# Patient Record
Sex: Female | Born: 1935 | Race: Black or African American | Hispanic: No | Smoking: Never smoker
Health system: Southern US, Community
[De-identification: ages and names within clinical notes are randomized; demographics above are authoritative.]

## PROBLEM LIST (undated history)

## (undated) DIAGNOSIS — I739 Peripheral vascular disease, unspecified: Secondary | ICD-10-CM

## (undated) DIAGNOSIS — K651 Peritoneal abscess: Secondary | ICD-10-CM

## (undated) DIAGNOSIS — R9431 Abnormal electrocardiogram [ECG] [EKG]: Secondary | ICD-10-CM

## (undated) DIAGNOSIS — Z8042 Family history of malignant neoplasm of prostate: Secondary | ICD-10-CM

## (undated) DIAGNOSIS — K56609 Unspecified intestinal obstruction, unspecified as to partial versus complete obstruction: Secondary | ICD-10-CM

## (undated) DIAGNOSIS — I10 Essential (primary) hypertension: Secondary | ICD-10-CM

## (undated) DIAGNOSIS — E119 Type 2 diabetes mellitus without complications: Secondary | ICD-10-CM

## (undated) DIAGNOSIS — Z800 Family history of malignant neoplasm of digestive organs: Secondary | ICD-10-CM

## (undated) DIAGNOSIS — Z803 Family history of malignant neoplasm of breast: Secondary | ICD-10-CM

## (undated) DIAGNOSIS — J45909 Unspecified asthma, uncomplicated: Secondary | ICD-10-CM

## (undated) DIAGNOSIS — M4800 Spinal stenosis, site unspecified: Secondary | ICD-10-CM

## (undated) DIAGNOSIS — E559 Vitamin D deficiency, unspecified: Secondary | ICD-10-CM

## (undated) DIAGNOSIS — I6529 Occlusion and stenosis of unspecified carotid artery: Secondary | ICD-10-CM

## (undated) DIAGNOSIS — IMO0002 Reserved for concepts with insufficient information to code with codable children: Secondary | ICD-10-CM

## (undated) DIAGNOSIS — E785 Hyperlipidemia, unspecified: Secondary | ICD-10-CM

## (undated) HISTORY — DX: Abnormal electrocardiogram (ECG) (EKG): R94.31

## (undated) HISTORY — DX: Family history of malignant neoplasm of digestive organs: Z80.0

## (undated) HISTORY — DX: Family history of malignant neoplasm of breast: Z80.3

## (undated) HISTORY — DX: Type 2 diabetes mellitus without complications: E11.9

## (undated) HISTORY — DX: Spinal stenosis, site unspecified: M48.00

## (undated) HISTORY — PX: TUBAL LIGATION: SHX77

## (undated) HISTORY — DX: Vitamin D deficiency, unspecified: E55.9

## (undated) HISTORY — PX: CYST EXCISION: SHX5701

## (undated) HISTORY — DX: Peripheral vascular disease, unspecified: I73.9

## (undated) HISTORY — DX: Unspecified asthma, uncomplicated: J45.909

## (undated) HISTORY — DX: Essential (primary) hypertension: I10

## (undated) HISTORY — PX: CHOLECYSTECTOMY: SHX55

## (undated) HISTORY — DX: Occlusion and stenosis of unspecified carotid artery: I65.29

## (undated) HISTORY — DX: Hyperlipidemia, unspecified: E78.5

## (undated) HISTORY — DX: Family history of malignant neoplasm of prostate: Z80.42

## (undated) HISTORY — PX: HERNIA REPAIR: SHX51

## (undated) HISTORY — DX: Reserved for concepts with insufficient information to code with codable children: IMO0002

---

## 1962-03-21 HISTORY — PX: MINI-LAPAROTOMY W/ TUBAL LIGATION: SUR811

## 1997-09-15 ENCOUNTER — Ambulatory Visit (HOSPITAL_COMMUNITY): Admission: RE | Admit: 1997-09-15 | Discharge: 1997-09-15 | Payer: Self-pay | Admitting: Internal Medicine

## 1997-10-08 ENCOUNTER — Ambulatory Visit (HOSPITAL_COMMUNITY): Admission: RE | Admit: 1997-10-08 | Discharge: 1997-10-08 | Payer: Self-pay | Admitting: Internal Medicine

## 1998-08-28 ENCOUNTER — Encounter: Payer: Self-pay | Admitting: Internal Medicine

## 1998-08-28 ENCOUNTER — Ambulatory Visit (HOSPITAL_COMMUNITY): Admission: RE | Admit: 1998-08-28 | Discharge: 1998-08-28 | Payer: Self-pay | Admitting: Internal Medicine

## 1998-09-08 ENCOUNTER — Ambulatory Visit (HOSPITAL_COMMUNITY): Admission: RE | Admit: 1998-09-08 | Discharge: 1998-09-08 | Payer: Self-pay | Admitting: Internal Medicine

## 1999-09-17 ENCOUNTER — Encounter: Payer: Self-pay | Admitting: Internal Medicine

## 1999-09-17 ENCOUNTER — Ambulatory Visit (HOSPITAL_COMMUNITY): Admission: RE | Admit: 1999-09-17 | Discharge: 1999-09-17 | Payer: Self-pay | Admitting: Internal Medicine

## 2000-10-17 ENCOUNTER — Encounter: Payer: Self-pay | Admitting: Internal Medicine

## 2000-10-17 ENCOUNTER — Ambulatory Visit (HOSPITAL_COMMUNITY): Admission: RE | Admit: 2000-10-17 | Discharge: 2000-10-17 | Payer: Self-pay | Admitting: Internal Medicine

## 2001-10-10 ENCOUNTER — Other Ambulatory Visit: Admission: RE | Admit: 2001-10-10 | Discharge: 2001-10-10 | Payer: Self-pay | Admitting: Internal Medicine

## 2001-10-12 ENCOUNTER — Encounter: Payer: Self-pay | Admitting: Internal Medicine

## 2001-10-12 ENCOUNTER — Encounter: Admission: RE | Admit: 2001-10-12 | Discharge: 2001-10-12 | Payer: Self-pay | Admitting: Internal Medicine

## 2001-11-05 ENCOUNTER — Encounter: Payer: Self-pay | Admitting: Internal Medicine

## 2001-11-05 ENCOUNTER — Ambulatory Visit (HOSPITAL_COMMUNITY): Admission: RE | Admit: 2001-11-05 | Discharge: 2001-11-05 | Payer: Self-pay | Admitting: Internal Medicine

## 2002-11-04 ENCOUNTER — Encounter: Admission: RE | Admit: 2002-11-04 | Discharge: 2003-02-02 | Payer: Self-pay | Admitting: Internal Medicine

## 2003-01-08 ENCOUNTER — Encounter: Payer: Self-pay | Admitting: Internal Medicine

## 2003-01-08 ENCOUNTER — Ambulatory Visit (HOSPITAL_COMMUNITY): Admission: RE | Admit: 2003-01-08 | Discharge: 2003-01-08 | Payer: Self-pay | Admitting: Internal Medicine

## 2003-02-04 ENCOUNTER — Encounter: Admission: RE | Admit: 2003-02-04 | Discharge: 2003-05-05 | Payer: Self-pay | Admitting: Internal Medicine

## 2004-01-27 ENCOUNTER — Ambulatory Visit (HOSPITAL_COMMUNITY): Admission: RE | Admit: 2004-01-27 | Discharge: 2004-01-27 | Payer: Self-pay | Admitting: Internal Medicine

## 2005-02-17 ENCOUNTER — Ambulatory Visit (HOSPITAL_COMMUNITY): Admission: RE | Admit: 2005-02-17 | Discharge: 2005-02-17 | Payer: Self-pay | Admitting: Internal Medicine

## 2005-07-20 ENCOUNTER — Encounter: Payer: Self-pay | Admitting: Internal Medicine

## 2006-01-31 ENCOUNTER — Other Ambulatory Visit: Admission: RE | Admit: 2006-01-31 | Discharge: 2006-01-31 | Payer: Self-pay | Admitting: Internal Medicine

## 2006-03-06 ENCOUNTER — Ambulatory Visit (HOSPITAL_COMMUNITY): Admission: RE | Admit: 2006-03-06 | Discharge: 2006-03-06 | Payer: Self-pay | Admitting: Internal Medicine

## 2007-03-27 ENCOUNTER — Ambulatory Visit (HOSPITAL_COMMUNITY): Admission: RE | Admit: 2007-03-27 | Discharge: 2007-03-27 | Payer: Self-pay | Admitting: Internal Medicine

## 2008-04-11 ENCOUNTER — Encounter: Payer: Self-pay | Admitting: Internal Medicine

## 2008-04-14 ENCOUNTER — Ambulatory Visit (HOSPITAL_COMMUNITY): Admission: RE | Admit: 2008-04-14 | Discharge: 2008-04-14 | Payer: Self-pay | Admitting: Internal Medicine

## 2008-05-09 ENCOUNTER — Ambulatory Visit: Payer: Self-pay | Admitting: Internal Medicine

## 2008-05-22 ENCOUNTER — Ambulatory Visit: Payer: Self-pay | Admitting: Internal Medicine

## 2008-12-29 ENCOUNTER — Ambulatory Visit: Payer: Self-pay | Admitting: Gastroenterology

## 2008-12-30 ENCOUNTER — Inpatient Hospital Stay (HOSPITAL_COMMUNITY): Admission: EM | Admit: 2008-12-30 | Discharge: 2009-01-02 | Payer: Self-pay | Admitting: Emergency Medicine

## 2008-12-31 ENCOUNTER — Encounter (INDEPENDENT_AMBULATORY_CARE_PROVIDER_SITE_OTHER): Payer: Self-pay | Admitting: General Surgery

## 2009-01-01 ENCOUNTER — Encounter: Payer: Self-pay | Admitting: Gastroenterology

## 2009-05-22 ENCOUNTER — Ambulatory Visit (HOSPITAL_COMMUNITY): Admission: RE | Admit: 2009-05-22 | Discharge: 2009-05-22 | Payer: Self-pay | Admitting: Internal Medicine

## 2009-12-25 ENCOUNTER — Encounter: Admission: RE | Admit: 2009-12-25 | Discharge: 2009-12-25 | Payer: Self-pay | Admitting: Orthopedic Surgery

## 2010-02-27 ENCOUNTER — Emergency Department (HOSPITAL_COMMUNITY)
Admission: EM | Admit: 2010-02-27 | Discharge: 2010-02-27 | Payer: Self-pay | Source: Home / Self Care | Admitting: Emergency Medicine

## 2010-05-26 ENCOUNTER — Other Ambulatory Visit (HOSPITAL_COMMUNITY): Payer: Self-pay | Admitting: Internal Medicine

## 2010-05-26 DIAGNOSIS — Z1231 Encounter for screening mammogram for malignant neoplasm of breast: Secondary | ICD-10-CM

## 2010-06-01 ENCOUNTER — Ambulatory Visit (HOSPITAL_COMMUNITY)
Admission: RE | Admit: 2010-06-01 | Discharge: 2010-06-01 | Disposition: A | Discharge status: Home / Self Care | Payer: Medicare PPO | Source: Ambulatory Visit | Attending: Internal Medicine | Admitting: Internal Medicine

## 2010-06-01 DIAGNOSIS — Z1231 Encounter for screening mammogram for malignant neoplasm of breast: Secondary | ICD-10-CM

## 2010-06-01 LAB — POCT I-STAT, CHEM 8
BUN: 16 mg/dL (ref 6–23)
Calcium, Ion: 1.12 mmol/L (ref 1.12–1.32)
Chloride: 104 mEq/L (ref 96–112)
Creatinine, Ser: 1 mg/dL (ref 0.4–1.2)
Glucose, Bld: 162 mg/dL — ABNORMAL HIGH (ref 70–99)
HCT: 37 % (ref 36.0–46.0)
Hemoglobin: 12.6 g/dL (ref 12.0–15.0)
Potassium: 3.8 mEq/L (ref 3.5–5.1)
Sodium: 141 mEq/L (ref 135–145)
TCO2: 29 mmol/L (ref 0–100)

## 2010-06-01 LAB — DIFFERENTIAL
Basophils Absolute: 0 10*3/uL (ref 0.0–0.1)
Basophils Relative: 0 % (ref 0–1)
Eosinophils Absolute: 0.2 10*3/uL (ref 0.0–0.7)
Eosinophils Relative: 4 % (ref 0–5)
Lymphocytes Relative: 40 % (ref 12–46)
Lymphs Abs: 2.2 10*3/uL (ref 0.7–4.0)
Monocytes Absolute: 0.5 10*3/uL (ref 0.1–1.0)
Monocytes Relative: 9 % (ref 3–12)
Neutro Abs: 2.5 10*3/uL (ref 1.7–7.7)
Neutrophils Relative %: 46 % (ref 43–77)

## 2010-06-01 LAB — POCT CARDIAC MARKERS
CKMB, poc: 1.1 ng/mL (ref 1.0–8.0)
Myoglobin, poc: 90.1 ng/mL (ref 12–200)
Troponin i, poc: <0.05 ng/mL (ref 0.00–0.09)

## 2010-06-01 LAB — CBC
HCT: 34.8 % — ABNORMAL LOW (ref 36.0–46.0)
Hemoglobin: 11.3 g/dL — ABNORMAL LOW (ref 12.0–15.0)
MCH: 30.8 pg (ref 26.0–34.0)
MCHC: 32.5 g/dL (ref 30.0–36.0)
MCV: 94.8 fL (ref 78.0–100.0)
Platelets: 222 10*3/uL (ref 150–400)
RBC: 3.67 MIL/uL — ABNORMAL LOW (ref 3.87–5.11)
RDW: 12.8 % (ref 11.5–15.5)
WBC: 5.3 10*3/uL (ref 4.0–10.5)

## 2010-06-01 LAB — BASIC METABOLIC PANEL
BUN: 12 mg/dL (ref 6–23)
CO2: 27 mEq/L (ref 19–32)
Calcium: 8.7 mg/dL (ref 8.4–10.5)
Chloride: 101 mEq/L (ref 96–112)
Creatinine, Ser: 0.88 mg/dL (ref 0.4–1.2)
GFR calc Af Amer: >60 mL/min (ref >60)
GFR calc non Af Amer: >60 mL/min (ref >60)
Glucose, Bld: 174 mg/dL — ABNORMAL HIGH (ref 70–99)
Potassium: 3.7 mEq/L (ref 3.5–5.1)
Sodium: 133 mEq/L — ABNORMAL LOW (ref 135–145)

## 2010-06-24 LAB — BASIC METABOLIC PANEL
BUN: 7 mg/dL (ref 6–23)
CO2: 30 mEq/L (ref 19–32)
Chloride: 102 mEq/L (ref 96–112)
Glucose, Bld: 130 mg/dL — ABNORMAL HIGH (ref 70–99)
Potassium: 3.6 mEq/L (ref 3.5–5.1)

## 2010-06-24 LAB — COMPREHENSIVE METABOLIC PANEL
ALT: 15 U/L (ref 0–35)
Alkaline Phosphatase: 62 U/L (ref 39–117)
BUN: 10 mg/dL (ref 6–23)
BUN: 4 mg/dL — ABNORMAL LOW (ref 6–23)
CO2: 28 mEq/L (ref 19–32)
Calcium: 9.5 mg/dL (ref 8.4–10.5)
Chloride: 101 mEq/L (ref 96–112)
GFR calc non Af Amer: >60 mL/min (ref >60)
Glucose, Bld: 158 mg/dL — ABNORMAL HIGH (ref 70–99)
Glucose, Bld: 176 mg/dL — ABNORMAL HIGH (ref 70–99)
Potassium: 3.4 mEq/L — ABNORMAL LOW (ref 3.5–5.1)
Sodium: 133 mEq/L — ABNORMAL LOW (ref 135–145)
Total Bilirubin: 0.5 mg/dL (ref 0.3–1.2)
Total Protein: 6 g/dL (ref 6.0–8.3)
Total Protein: 7.2 g/dL (ref 6.0–8.3)

## 2010-06-24 LAB — CBC
HCT: 29.7 % — ABNORMAL LOW (ref 36.0–46.0)
HCT: 30.7 % — ABNORMAL LOW (ref 36.0–46.0)
HCT: 33.1 % — ABNORMAL LOW (ref 36.0–46.0)
Hemoglobin: 10 g/dL — ABNORMAL LOW (ref 12.0–15.0)
Hemoglobin: 10.2 g/dL — ABNORMAL LOW (ref 12.0–15.0)
MCHC: 33.2 g/dL (ref 30.0–36.0)
MCHC: 33.6 g/dL (ref 30.0–36.0)
MCV: 97.3 fL (ref 78.0–100.0)
MCV: 97.5 fL (ref 78.0–100.0)
MCV: 98.5 fL (ref 78.0–100.0)
Platelets: 136 10*3/uL — ABNORMAL LOW (ref 150–400)
Platelets: 150 10*3/uL (ref 150–400)
Platelets: 168 10*3/uL (ref 150–400)
RBC: 3.55 MIL/uL — ABNORMAL LOW (ref 3.87–5.11)
RBC: 4.09 MIL/uL (ref 3.87–5.11)
RDW: 13.4 % (ref 11.5–15.5)
RDW: 13.8 % (ref 11.5–15.5)
WBC: 14.3 10*3/uL — ABNORMAL HIGH (ref 4.0–10.5)
WBC: 14.4 10*3/uL — ABNORMAL HIGH (ref 4.0–10.5)
WBC: 7.8 10*3/uL (ref 4.0–10.5)

## 2010-06-24 LAB — CARDIAC PANEL(CRET KIN+CKTOT+MB+TROPI)
CK, MB: 2.4 ng/mL (ref 0.3–4.0)
CK, MB: 3.8 ng/mL (ref 0.3–4.0)
Relative Index: 1 (ref 0.0–2.5)
Total CK: 182 U/L — ABNORMAL HIGH (ref 7–177)
Total CK: 381 U/L — ABNORMAL HIGH (ref 7–177)
Troponin I: 0.05 ng/mL (ref 0.00–0.06)
Troponin I: 0.06 ng/mL (ref 0.00–0.06)

## 2010-06-24 LAB — URINALYSIS, ROUTINE W REFLEX MICROSCOPIC
Bilirubin Urine: NEGATIVE
Ketones, ur: NEGATIVE mg/dL
Specific Gravity, Urine: 1.035 — ABNORMAL HIGH (ref 1.005–1.030)
Urobilinogen, UA: 0.2 mg/dL (ref 0.0–1.0)
pH: 6 (ref 5.0–8.0)

## 2010-06-24 LAB — LIPID PANEL
Cholesterol: 121 mg/dL (ref 0–200)
HDL: 53 mg/dL (ref >39)
LDL Cholesterol: 65 mg/dL (ref 0–99)
Triglycerides: 56 mg/dL (ref <150)
VLDL: 11 mg/dL (ref 0–40)

## 2010-06-24 LAB — POCT I-STAT, CHEM 8
BUN: 13 mg/dL (ref 6–23)
Chloride: 99 mEq/L (ref 96–112)
Creatinine, Ser: 0.6 mg/dL (ref 0.4–1.2)
Glucose, Bld: 149 mg/dL — ABNORMAL HIGH (ref 70–99)
Potassium: 3.2 mEq/L — ABNORMAL LOW (ref 3.5–5.1)
Sodium: 136 mEq/L (ref 135–145)

## 2010-06-24 LAB — URINE MICROSCOPIC-ADD ON

## 2010-06-24 LAB — DIFFERENTIAL
Eosinophils Absolute: 0 10*3/uL (ref 0.0–0.7)
Lymphocytes Relative: 15 % (ref 12–46)
Lymphs Abs: 1.2 10*3/uL (ref 0.7–4.0)
Monocytes Relative: 3 % (ref 3–12)
Neutrophils Relative %: 82 % — ABNORMAL HIGH (ref 43–77)

## 2010-06-24 LAB — POCT CARDIAC MARKERS
CKMB, poc: 2.1 ng/mL (ref 1.0–8.0)
Myoglobin, poc: 86.5 ng/mL (ref 12–200)
Troponin i, poc: <0.05 ng/mL (ref 0.00–0.09)

## 2010-06-24 LAB — CK TOTAL AND CKMB (NOT AT ARMC)
CK, MB: 3.3 ng/mL (ref 0.3–4.0)
Total CK: 132 U/L (ref 7–177)

## 2010-06-24 LAB — TROPONIN I: Troponin I: 0.04 ng/mL (ref 0.00–0.06)

## 2010-06-24 LAB — LIPASE, BLOOD: Lipase: 17 U/L (ref 11–59)

## 2010-06-24 LAB — AMYLASE
Amylase: 100 U/L (ref 27–131)
Amylase: 42 U/L (ref 27–131)

## 2010-06-24 LAB — PROTIME-INR: Prothrombin Time: 12.8 seconds (ref 11.6–15.2)

## 2010-06-24 LAB — APTT: aPTT: 26 seconds (ref 24–37)

## 2011-04-12 ENCOUNTER — Other Ambulatory Visit (HOSPITAL_COMMUNITY): Payer: Self-pay | Admitting: Internal Medicine

## 2011-04-12 DIAGNOSIS — Z780 Asymptomatic menopausal state: Secondary | ICD-10-CM

## 2011-04-14 ENCOUNTER — Ambulatory Visit (HOSPITAL_COMMUNITY)
Admission: RE | Admit: 2011-04-14 | Discharge: 2011-04-14 | Disposition: A | Discharge status: Home / Self Care | Payer: Medicare PPO | Source: Ambulatory Visit | Attending: Internal Medicine | Admitting: Internal Medicine

## 2011-04-14 DIAGNOSIS — Z780 Asymptomatic menopausal state: Secondary | ICD-10-CM | POA: Insufficient documentation

## 2011-04-14 DIAGNOSIS — Z13820 Encounter for screening for osteoporosis: Secondary | ICD-10-CM | POA: Insufficient documentation

## 2011-05-26 ENCOUNTER — Other Ambulatory Visit: Payer: Self-pay | Admitting: Internal Medicine

## 2011-05-26 DIAGNOSIS — I714 Abdominal aortic aneurysm, without rupture, unspecified: Secondary | ICD-10-CM

## 2011-06-02 ENCOUNTER — Ambulatory Visit
Admission: RE | Admit: 2011-06-02 | Discharge: 2011-06-02 | Disposition: A | Discharge status: Home / Self Care | Payer: Medicare PPO | Source: Ambulatory Visit | Attending: Internal Medicine | Admitting: Internal Medicine

## 2011-06-02 DIAGNOSIS — I714 Abdominal aortic aneurysm, without rupture, unspecified: Secondary | ICD-10-CM

## 2011-06-07 ENCOUNTER — Other Ambulatory Visit (HOSPITAL_COMMUNITY): Payer: Self-pay | Admitting: Internal Medicine

## 2011-06-07 DIAGNOSIS — Z1231 Encounter for screening mammogram for malignant neoplasm of breast: Secondary | ICD-10-CM

## 2011-07-01 ENCOUNTER — Ambulatory Visit (HOSPITAL_COMMUNITY)
Admission: RE | Admit: 2011-07-01 | Discharge: 2011-07-01 | Disposition: A | Discharge status: Home / Self Care | Payer: Medicare PPO | Source: Ambulatory Visit | Attending: Internal Medicine | Admitting: Internal Medicine

## 2011-07-01 DIAGNOSIS — Z1231 Encounter for screening mammogram for malignant neoplasm of breast: Secondary | ICD-10-CM | POA: Insufficient documentation

## 2012-06-13 ENCOUNTER — Other Ambulatory Visit (HOSPITAL_COMMUNITY): Payer: Self-pay | Admitting: Internal Medicine

## 2012-06-13 DIAGNOSIS — Z1231 Encounter for screening mammogram for malignant neoplasm of breast: Secondary | ICD-10-CM

## 2012-07-02 ENCOUNTER — Ambulatory Visit (HOSPITAL_COMMUNITY)
Admission: RE | Admit: 2012-07-02 | Discharge: 2012-07-02 | Disposition: A | Discharge status: Home / Self Care | Payer: Medicare PPO | Source: Ambulatory Visit | Attending: Internal Medicine | Admitting: Internal Medicine

## 2012-07-02 DIAGNOSIS — Z1231 Encounter for screening mammogram for malignant neoplasm of breast: Secondary | ICD-10-CM | POA: Insufficient documentation

## 2013-01-30 ENCOUNTER — Telehealth: Payer: Self-pay | Admitting: *Deleted

## 2013-01-30 ENCOUNTER — Other Ambulatory Visit: Payer: Self-pay | Admitting: Emergency Medicine

## 2013-01-30 MED ORDER — ENALAPRIL MALEATE 20 MG PO TABS: 20.0000 mg | ORAL_TABLET | Freq: Two times a day (BID) | ORAL | Status: DC

## 2013-01-30 NOTE — Telephone Encounter (Signed)
DOB 09/02/1929  Pt needs rx called to "new" pharm   =  Kristine Burnett   [id number]  Ph  [phone] fax [phone]   enalipril

## 2013-03-19 ENCOUNTER — Encounter: Payer: Self-pay | Admitting: Internal Medicine

## 2013-03-22 ENCOUNTER — Ambulatory Visit (INDEPENDENT_AMBULATORY_CARE_PROVIDER_SITE_OTHER): Payer: Medicare PPO | Admitting: Internal Medicine

## 2013-03-22 ENCOUNTER — Encounter: Payer: Self-pay | Admitting: Internal Medicine

## 2013-03-22 VITALS — BP 188/94 | HR 64 | Temp 97.9°F | Resp 18 | Wt 184.4 lb

## 2013-03-22 DIAGNOSIS — I10 Essential (primary) hypertension: Secondary | ICD-10-CM | POA: Insufficient documentation

## 2013-03-22 DIAGNOSIS — E119 Type 2 diabetes mellitus without complications: Secondary | ICD-10-CM

## 2013-03-22 DIAGNOSIS — Z79899 Other long term (current) drug therapy: Secondary | ICD-10-CM

## 2013-03-22 DIAGNOSIS — E559 Vitamin D deficiency, unspecified: Secondary | ICD-10-CM | POA: Insufficient documentation

## 2013-03-22 DIAGNOSIS — J45909 Unspecified asthma, uncomplicated: Secondary | ICD-10-CM | POA: Insufficient documentation

## 2013-03-22 DIAGNOSIS — R7309 Other abnormal glucose: Secondary | ICD-10-CM

## 2013-03-22 DIAGNOSIS — Z0000 Encounter for general adult medical examination without abnormal findings: Secondary | ICD-10-CM

## 2013-03-22 DIAGNOSIS — E782 Mixed hyperlipidemia: Secondary | ICD-10-CM | POA: Insufficient documentation

## 2013-03-22 DIAGNOSIS — Z1212 Encounter for screening for malignant neoplasm of rectum: Secondary | ICD-10-CM

## 2013-03-22 HISTORY — DX: Type 2 diabetes mellitus without complications: E11.9

## 2013-03-22 LAB — HEMOGLOBIN A1C
Hgb A1c MFr Bld: 6.9 % — ABNORMAL HIGH (ref <5.7)
Mean Plasma Glucose: 151 mg/dL — ABNORMAL HIGH (ref <117)

## 2013-03-22 LAB — LIPID PANEL
CHOL/HDL RATIO: 2.9 ratio
Cholesterol: 161 mg/dL (ref 0–200)
HDL: 56 mg/dL (ref >39)
LDL Cholesterol: 93 mg/dL (ref 0–99)
TRIGLYCERIDES: 58 mg/dL (ref <150)
VLDL: 12 mg/dL (ref 0–40)

## 2013-03-22 LAB — CBC WITH DIFFERENTIAL/PLATELET
BASOS ABS: 0 10*3/uL (ref 0.0–0.1)
Basophils Relative: 1 % (ref 0–1)
EOS PCT: 4 % (ref 0–5)
Eosinophils Absolute: 0.2 10*3/uL (ref 0.0–0.7)
HCT: 41.3 % (ref 36.0–46.0)
Hemoglobin: 13.8 g/dL (ref 12.0–15.0)
LYMPHS ABS: 1.9 10*3/uL (ref 0.7–4.0)
LYMPHS PCT: 36 % (ref 12–46)
MCH: 30.2 pg (ref 26.0–34.0)
MCHC: 33.4 g/dL (ref 30.0–36.0)
MCV: 90.4 fL (ref 78.0–100.0)
Monocytes Absolute: 0.4 10*3/uL (ref 0.1–1.0)
Monocytes Relative: 7 % (ref 3–12)
NEUTROS ABS: 2.7 10*3/uL (ref 1.7–7.7)
NEUTROS PCT: 52 % (ref 43–77)
PLATELETS: 206 10*3/uL (ref 150–400)
RBC: 4.57 MIL/uL (ref 3.87–5.11)
RDW: 13.9 % (ref 11.5–15.5)
WBC: 5.1 10*3/uL (ref 4.0–10.5)

## 2013-03-22 LAB — BASIC METABOLIC PANEL WITH GFR
BUN: 14 mg/dL (ref 6–23)
CHLORIDE: 99 meq/L (ref 96–112)
CO2: 30 mEq/L (ref 19–32)
CREATININE: 0.8 mg/dL (ref 0.50–1.10)
Calcium: 10 mg/dL (ref 8.4–10.5)
GFR, EST NON AFRICAN AMERICAN: 69 mL/min
GFR, Est African American: 80 mL/min
Glucose, Bld: 109 mg/dL — ABNORMAL HIGH (ref 70–99)
Potassium: 3.9 mEq/L (ref 3.5–5.3)
SODIUM: 140 meq/L (ref 135–145)

## 2013-03-22 LAB — TSH: TSH: 2.332 u[IU]/mL (ref 0.350–4.500)

## 2013-03-22 LAB — HEPATIC FUNCTION PANEL
ALBUMIN: 4.1 g/dL (ref 3.5–5.2)
ALK PHOS: 61 U/L (ref 39–117)
ALT: 16 U/L (ref 0–35)
AST: 25 U/L (ref 0–37)
BILIRUBIN DIRECT: 0.1 mg/dL (ref 0.0–0.3)
BILIRUBIN TOTAL: 0.4 mg/dL (ref 0.3–1.2)
Indirect Bilirubin: 0.3 mg/dL (ref 0.0–0.9)
Total Protein: 7.8 g/dL (ref 6.0–8.3)

## 2013-03-22 LAB — MAGNESIUM: MAGNESIUM: 1.9 mg/dL (ref 1.5–2.5)

## 2013-03-22 NOTE — Progress Notes (Signed)
Patient ID: Kristine Burnett, female   DOB: 04-01-1929, 80 y.o.   MRN: [id number]   This very nice 80 y.o. WBF presents for 3 month follow up with Hypertension, Hyperlipidemia, Pre-Diabetes and Vitamin D Deficiency.    HTN predates from 49. BP has been controlled at home. Today's BP: 188/94 mmHg - patient relates she is very upset as she lost her car keys coming into the office. Last labs showed BUN/Creat 11/0.96 and calc GFR of 56 suggesting moderate renal impairment. Patient denies any cardiac type chest pain, palpitations, dyspnea/orthopnea/PND, dizziness, claudication, or dependent edema.   Hyperlipidemia is controlled with diet & meds. Last Cholesterol was 150, Triglycerides were 55, HDL 55 and LDL 54 in Sept - all at goal. Patient denies myalgias or other med SE's.    Also, the patient has history of T2 NIDDM diagnosed in 2004 with A1c 6.7% and with weight loss has improved but still in diet controlled T2DM range with last A1c of 6.8% in Sept. Patient denies any symptoms of reactive hypoglycemia, diabetic polys, paresthesias or visual blurring.   She has lumbar DDD and Spinal Stenosis and has been seen by Dr's Eddie Dibbles and [facility] [place] since 2011. She has also been on- off and back on fosamax since June 2012. Since 2011, she has used a cane til 2013 she began using a walker for gait stability.    Further, Patient has history of Vitamin D Deficiency at 28 in 2008 with last vitamin D of 82 in June. Patient supplements vitamin D without any suspected side-effects.  Medication Sig Dispense Refill  . alendronate (FOSAMAX) 70 MG tablet Take 70 mg by mouth once a week. Take with a full glass of water on an empty stomach.      Marland Kitchen aspirin 325 MG tablet Take 325 mg by mouth daily.      Marland Kitchen atenolol (TENORMIN) 100 MG tablet Take 100 mg by mouth daily.      . Cholecalciferol (VITAMIN D-3) 1000 UNITS CAPS Take 4,000 Units by mouth daily.      Marland Kitchen diltiazem (CARDIZEM) 120 MG tablet Take 120 mg by mouth 2 (two)  times daily.      . enalapril (VASOTEC) 20 MG tablet Take 1 tablet (20 mg total) by mouth 2 (two) times daily.  60 tablet  6  . hydrochlorothiazide (HYDRODIURIL) 25 MG tablet Take 12.5 mg by mouth daily.      . simvastatin (ZOCOR) 40 MG tablet Take 40 mg by mouth daily.         Allergies  Allergen Reactions  . Ace Inhibitors Cough  . Crestor [Rosuvastatin]   . Grapefruit Extract   . Minoxidil   . Penicillins     PMHx:   Past Medical History  Diagnosis Date  . Hypertension   . Hyperlipidemia   . Diabetes mellitus without complication   . Vitamin D deficiency   . Asthma   . DDD (degenerative disc disease) - Lumbar   . Spinal stenosis - Lumbar     FHx:    Reviewed / unchanged  SHx:    Reviewed / unchanged  Systems Review: Constitutional: Denies fever, chills, wt changes, headaches, insomnia, fatigue, night sweats, change in appetite. Eyes: Denies redness, blurred vision, diplopia, discharge, itchy, watery eyes.  ENT: Denies discharge, congestion, post nasal drip, epistaxis, sore throat, earache, hearing loss, dental pain, tinnitus, vertigo, sinus pain, snoring.  CV: Denies chest pain, palpitations, irregular heartbeat, syncope, dyspnea, diaphoresis, orthopnea, PND, claudication, edema. Respiratory: denies cough,  dyspnea, DOE, pleurisy, hoarseness, laryngitis, wheezing.  Gastrointestinal: Denies dysphagia, odynophagia, heartburn, reflux, water brash, abdominal pain or cramps, nausea, vomiting, bloating, diarrhea, constipation, hematemesis, melena, hematochezia,  or hemorrhoids. Genitourinary: Denies dysuria, frequency, urgency, nocturia, hesitancy, discharge, hematuria, flank pain. Musculoskeletal: Denies arthralgias, myalgias, stiffness, jt. swelling,  limp, strain/sprain. Does c/o occasional Low Back Pain w/o Sciatica. Skin: Denies pruritus, rash, hives, warts, acne, eczema, change in skin lesion(s). Neuro: No weakness, tremor, incoordination, spasms, paresthesia, or  pain. Psychiatric: Denies confusion, memory loss, or sensory loss. Endo: Denies change in weight, skin, hair change.  Heme/Lymph: No excessive bleeding, bruising, orenlarged lymph nodes.  BP: 188/94      Rechecked at 181/91  Pulse: 64  Temp: 97.9 F (36.6 C)  Resp: 18    On Exam: Appears well nourished - in no distress. Eyes: PERRLA, EOMs, conjunctiva no swelling or erythema. Sinuses: No frontal/maxillary tenderness ENT/Mouth: EAC's clear, TM's nl w/o erythema, bulging. Nares clear w/o erythema, swelling, exudates. Oropharynx clear without erythema or exudates. Oral hygiene is good. Tongue normal, non obstructing. Hearing intact.  Neck: Supple. Thyroid nl. Car 2+/2+ without bruits, nodes or JVD. Chest: Respirations nl with BS clear & equal w/o rales, rhonchi, wheezing or stridor.  Cor: Heart sounds normal w/ regular rate and rhythm without sig. murmurs, gallops, clicks, or rubs. Peripheral pulses normal and equal  without edema.  Abdomen: Soft & bowel sounds normal. Non-tender w/o guarding, rebound, hernias, masses, or organomegaly.  Lymphatics: Unremarkable.  Musculoskeletal: Full ROM all peripheral extremities, joint stability, 5/5 strength, and uses a walker for gait stability.  Skin: Warm, dry without exposed rashes, lesions, ecchymosis apparent.  Neuro: Cranial nerves intact, reflexes equal bilaterally. Sensory-motor testing grossly intact. Tendon reflexes grossly intact.  Pysch: Alert & oriented x 3. Insight and judgement nl & appropriate. No ideations.  Assessment and Plan:  1. Hypertension - Continue monitor blood pressure at home & advised to call if remains elevated over 135/85. Continue diet/meds same.  2. Hyperlipidemia - Continue diet/meds, exercise,& lifestyle modifications. Continue monitor periodic cholesterol/liver & renal functions   3. Diabetes - continue recommend prudent low glycemic diet, strongly urging weight control, regular exercise, diabetic monitoring and  periodic eye exams.  4. Vitamin D Deficiency - Continue supplementation.  Recommended regular exercise, BP monitoring, weight control, and discussed med and SE's. Recommended labs to assess and monitor clinical status. Further disposition pending results of labs.

## 2013-03-22 NOTE — Patient Instructions (Signed)

## 2013-03-23 LAB — INSULIN, FASTING: INSULIN FASTING, SERUM: 21 u[IU]/mL (ref 3–28)

## 2013-03-23 LAB — VITAMIN D 25 HYDROXY (VIT D DEFICIENCY, FRACTURES): VIT D 25 HYDROXY: 97 ng/mL — AB (ref 30–89)

## 2013-03-25 ENCOUNTER — Other Ambulatory Visit: Payer: Commercial Managed Care - HMO | Admitting: *Deleted

## 2013-03-25 ENCOUNTER — Telehealth: Payer: Self-pay | Admitting: *Deleted

## 2013-03-25 DIAGNOSIS — K625 Hemorrhage of anus and rectum: Secondary | ICD-10-CM

## 2013-03-25 NOTE — Telephone Encounter (Signed)
Message copied by Emelda Brothers on Mon Mar 25, 2013 10:26 AM ------      Message from: Unk Pinto      Created: Sat Mar 23, 2013  1:41 PM       CBC kidneys thyroid mag - all nl & ok --- chol 161 excellent - keep up the great work --- A1c still elevated almost ready to start diabetic meds -unless lose weight - about 20 #       Vit D 97 - excellent - keep up the great work ------

## 2013-04-04 ENCOUNTER — Other Ambulatory Visit: Payer: Self-pay | Admitting: *Deleted

## 2013-04-04 DIAGNOSIS — K625 Hemorrhage of anus and rectum: Secondary | ICD-10-CM

## 2013-04-04 LAB — POC HEMOCCULT BLD/STL (HOME/3-CARD/SCREEN)
Card #2 Fecal Occult Blod, POC: NEGATIVE
Card #3 Fecal Occult Blood, POC: NEGATIVE
FECAL OCCULT BLD: NEGATIVE

## 2013-05-21 ENCOUNTER — Ambulatory Visit (INDEPENDENT_AMBULATORY_CARE_PROVIDER_SITE_OTHER): Payer: Medicare PPO | Admitting: Emergency Medicine

## 2013-05-21 ENCOUNTER — Encounter: Payer: Self-pay | Admitting: Emergency Medicine

## 2013-05-21 VITALS — BP 158/80 | HR 68 | Temp 98.2°F | Resp 18 | Ht 66.5 in | Wt 183.0 lb

## 2013-05-21 DIAGNOSIS — R319 Hematuria, unspecified: Secondary | ICD-10-CM

## 2013-05-21 LAB — URINALYSIS, ROUTINE W REFLEX MICROSCOPIC
GLUCOSE, UA: 100 mg/dL — AB
KETONES UR: 40 mg/dL — AB
Nitrite: POSITIVE — AB
Protein, ur: 100 mg/dL — AB
Specific Gravity, Urine: 1.014 (ref 1.005–1.030)
Urobilinogen, UA: 1 mg/dL (ref 0.0–1.0)
pH: 5.5 (ref 5.0–8.0)

## 2013-05-21 LAB — URINALYSIS, MICROSCOPIC ONLY
Bacteria, UA: NONE SEEN
Casts: NONE SEEN
RBC / HPF: >50 RBC/hpf — AB (ref <3)

## 2013-05-21 MED ORDER — PHENAZOPYRIDINE HCL 200 MG PO TABS: 200.0000 mg | ORAL_TABLET | Freq: Three times a day (TID) | ORAL | Status: DC | PRN

## 2013-05-21 MED ORDER — CIPROFLOXACIN HCL 500 MG PO TABS: 500.0000 mg | ORAL_TABLET | Freq: Two times a day (BID) | ORAL | Status: AC

## 2013-05-21 NOTE — Progress Notes (Signed)
Subjective:    Patient ID: Kristine Burnett, female    DOB: Sep 17, 1929, 80 y.o.   MRN: [id number]  HPI Comments: 80 yo female with new onset of blood in urine. She has increased frequency, urgency, and occasionally incontinence. She deines back pain/ fever. She smoked in her early 41s socially.  She drinks 6 cups of coffee daily. She had one episode of blood over 1 year ago was treated with ABX and cleared.   She notes BP was better before infection. She denies any CV complaints.  Dysuria  Associated symptoms include hematuria.  Hematuria Associated symptoms include dysuria.    Current Outpatient Prescriptions on File Prior to Visit  Medication Sig Dispense Refill  . alendronate (FOSAMAX) 70 MG tablet Take 70 mg by mouth once a week. Take with a full glass of water on an empty stomach.      Marland Kitchen aspirin 325 MG tablet Take 325 mg by mouth daily.      Marland Kitchen atenolol (TENORMIN) 100 MG tablet Take 100 mg by mouth daily.      . baclofen (LIORESAL) 10 MG tablet       . Cholecalciferol (VITAMIN D-3) 1000 UNITS CAPS Take 4,000 Units by mouth daily.      Marland Kitchen diltiazem (CARDIZEM) 120 MG tablet Take 120 mg by mouth 2 (two) times daily.      . enalapril (VASOTEC) 20 MG tablet Take 1 tablet (20 mg total) by mouth 2 (two) times daily.  60 tablet  6  . Flaxseed, Linseed, (FLAXSEED OIL) 1000 MG CAPS Take 1,000 mg by mouth 4 (four) times daily.      . hydrochlorothiazide (HYDRODIURIL) 25 MG tablet Take 12.5 mg by mouth daily.      . Magnesium Oxide (MAG-OX PO) Take 500 mg by mouth 2 (two) times daily.      Marland Kitchen oxybutynin (DITROPAN) 5 MG tablet       . simvastatin (ZOCOR) 40 MG tablet Take 40 mg by mouth daily.       No current facility-administered medications on file prior to visit.   Allergies  Allergen Reactions  . Ace Inhibitors Cough  . Crestor [Rosuvastatin]   . Grapefruit Extract   . Minoxidil   . Penicillins    Past Medical History  Diagnosis Date  . Hypertension   . Hyperlipidemia   . Diabetes  mellitus without complication   . Vitamin D deficiency   . Asthma   . DDD (degenerative disc disease)   . Spinal stenosis      Review of Systems  Genitourinary: Positive for dysuria and hematuria.  All other systems reviewed and are negative.   BP 158/80  Pulse 68  Temp(Src) 98.2 F (36.8 C) (Temporal)  Resp 18  Ht 5' 6.5" (1.689 m)  Wt 183 lb (83.008 kg)  BMI 29.10 kg/m2     Objective:   Physical Exam  Nursing note and vitals reviewed. Constitutional: She is oriented to person, place, and time. She appears well-developed and well-nourished.  HENT:  Head: Normocephalic and atraumatic.  Eyes: Conjunctivae are normal.  Neck: Normal range of motion.  Cardiovascular: Normal rate, regular rhythm, normal heart sounds and intact distal pulses.   Pulmonary/Chest: Effort normal and breath sounds normal.  Abdominal: Soft. Bowel sounds are normal. She exhibits no distension and no mass. There is no tenderness. There is no rebound and no guarding.  Musculoskeletal: Normal range of motion.  Neurological: She is alert and oriented to person, place, and time.  Skin: Skin is warm and dry.  Psychiatric: She has a normal mood and affect. Judgment normal.          Assessment & Plan:  1. Hematuria/ Dysuria-  UTI ?-check labs, push H2O Hygiene explained, decrease caffeine. Cipro 500 mg AD, Pyridium 200 mg AD/ PRN 2. HTN vs anxious about infection- Check BP call if >130/80, increase cardio, advised of concern with urine infection and sepsis and she will go to the ER if any symptom increase

## 2013-05-21 NOTE — Patient Instructions (Signed)
Urinary Tract Infection A urinary tract infection (UTI) can occur any place along the urinary tract. The tract includes the kidneys, ureters, bladder, and urethra. A type of germ called bacteria often causes a UTI. UTIs are often helped with antibiotic medicine.  HOME CARE   If given, take antibiotics as told by your doctor. Finish them even if you start to feel better.  Drink enough fluids to keep your pee (urine) clear or pale yellow.  Avoid tea, drinks with caffeine, and bubbly (carbonated) drinks.  Pee often. Avoid holding your pee in for a long time.  Pee before and after having sex (intercourse).  Wipe from front to back after you poop (bowel movement) if you are a woman. Use each tissue only once. GET HELP RIGHT AWAY IF:   You have back pain.  You have lower belly (abdominal) pain.  You have chills.  You feel sick to your stomach (nauseous).  You throw up (vomit).  Your burning or discomfort with peeing does not go away.  You have a fever.  Your symptoms are not better in 3 days. MAKE SURE YOU:   Understand these instructions.  Will watch your condition.  Will get help right away if you are not doing well or get worse. Document Released: 08/24/2007 Document Revised: 11/30/2011 Document Reviewed: 10/06/2011 [facility] Patient Information 2014 [place], [place]. Hematuria, Adult Hematuria is blood in your urine. It can be caused by a bladder infection, kidney infection, prostate infection, kidney stone, or cancer of your urinary tract. Infections can usually be treated with medicine, and a kidney stone usually will pass through your urine. If neither of these is the cause of your hematuria, further workup to find out the reason may be needed. It is very important that you tell your health care provider about any blood you see in your urine, even if the blood stops without treatment or happens without causing pain. Blood in your urine that happens and then stops and then  happens again can be a symptom of a very serious condition. Also, pain is not a symptom in the initial stages of many urinary cancers. HOME CARE INSTRUCTIONS   Drink lots of fluid, 3 4 quarts a day. If you have been diagnosed with an infection, cranberry juice is especially recommended, in addition to large amounts of water.  Avoid caffeine, tea, and carbonated beverages, because they tend to irritate the bladder.  Avoid alcohol because it may irritate the prostate.  Only take over-the-counter or prescription medicines for pain, discomfort, or fever as directed by your health care provider.  If you have been diagnosed with a kidney stone, follow your health care provider's instructions regarding straining your urine to catch the stone.  Empty your bladder often. Avoid holding urine for long periods of time.  After a bowel movement, women should cleanse front to back. Use each tissue only once.  Empty your bladder before and after sexual intercourse if you are a female. SEEK MEDICAL CARE IF: You develop back pain, fever, a feeling of sickness in your stomach (nausea), or vomiting or if your symptoms are not better in 3 days. Return sooner if you are getting worse. SEEK IMMEDIATE MEDICAL CARE IF:   You have a persistent fever, with a temperature of 101.38F (38.8C) or greater.  You develop severe vomiting and are unable to keep the medicine down.  You develop severe back or abdominal pain despite taking your medicines.  You begin passing a large amount of blood or clots  in your urine.  You feel extremely weak or faint, or you pass out. MAKE SURE YOU:   Understand these instructions.  Will watch your condition.  Will get help right away if you are not doing well or get worse. Document Released: 03/07/2005 Document Revised: 12/26/2012 Document Reviewed: 11/05/2012 [facility] Patient Information 2014 [place].

## 2013-05-22 LAB — URINE CULTURE: Colony Count: 40000

## 2013-05-23 NOTE — Progress Notes (Signed)
Quick Note:  + UTI continue ABX AD, recheck at 4/7 visit, please call with any concerns ______

## 2013-06-05 ENCOUNTER — Telehealth: Payer: Self-pay | Admitting: *Deleted

## 2013-06-05 NOTE — Telephone Encounter (Signed)
Pt has a appt scheduled with Dr Delman Cheadle 06/17/13 at 10am for routine eye exam.  Pt has [employer] & needs a  Referral to Dr Delman Cheadle

## 2013-06-06 ENCOUNTER — Telehealth: Payer: Self-pay

## 2013-06-06 NOTE — Telephone Encounter (Signed)
Pt called requesting referral to see Dr. Delman Cheadle. Pt has [employer]. Pt does not need referral until 07-19-13. Pt aware.

## 2013-06-17 ENCOUNTER — Telehealth: Payer: Self-pay

## 2013-06-17 NOTE — Telephone Encounter (Signed)
Dr. Maurie Boettcher office called requesting [facility] referral. Pt saw Dr. Melissa Noon this morning for diabetes ICD 9- 250.50. [facility] referral was faxed. Dr. Maurie Boettcher office aware.

## 2013-06-25 ENCOUNTER — Other Ambulatory Visit: Payer: Self-pay | Admitting: Internal Medicine

## 2013-06-25 ENCOUNTER — Telehealth: Payer: Self-pay

## 2013-06-25 ENCOUNTER — Encounter: Payer: Self-pay | Admitting: Emergency Medicine

## 2013-06-25 ENCOUNTER — Other Ambulatory Visit: Payer: Self-pay | Admitting: Emergency Medicine

## 2013-06-25 ENCOUNTER — Ambulatory Visit (INDEPENDENT_AMBULATORY_CARE_PROVIDER_SITE_OTHER): Payer: Medicare PPO | Admitting: Emergency Medicine

## 2013-06-25 VITALS — BP 146/82 | HR 56 | Temp 98.0°F | Resp 16 | Ht 66.0 in | Wt 182.0 lb

## 2013-06-25 DIAGNOSIS — R319 Hematuria, unspecified: Secondary | ICD-10-CM

## 2013-06-25 DIAGNOSIS — R7309 Other abnormal glucose: Secondary | ICD-10-CM

## 2013-06-25 DIAGNOSIS — Z789 Other specified health status: Secondary | ICD-10-CM

## 2013-06-25 DIAGNOSIS — I10 Essential (primary) hypertension: Secondary | ICD-10-CM

## 2013-06-25 DIAGNOSIS — Z1331 Encounter for screening for depression: Secondary | ICD-10-CM

## 2013-06-25 DIAGNOSIS — R300 Dysuria: Secondary | ICD-10-CM

## 2013-06-25 DIAGNOSIS — E119 Type 2 diabetes mellitus without complications: Secondary | ICD-10-CM

## 2013-06-25 DIAGNOSIS — M810 Age-related osteoporosis without current pathological fracture: Secondary | ICD-10-CM

## 2013-06-25 DIAGNOSIS — Z0000 Encounter for general adult medical examination without abnormal findings: Secondary | ICD-10-CM

## 2013-06-25 DIAGNOSIS — E782 Mixed hyperlipidemia: Secondary | ICD-10-CM

## 2013-06-25 DIAGNOSIS — Z23 Encounter for immunization: Secondary | ICD-10-CM

## 2013-06-25 DIAGNOSIS — M948X9 Other specified disorders of cartilage, unspecified sites: Secondary | ICD-10-CM

## 2013-06-25 LAB — CBC WITH DIFFERENTIAL/PLATELET
Basophils Absolute: 0.1 10*3/uL (ref 0.0–0.1)
Basophils Relative: 1 % (ref 0–1)
EOS ABS: 0.3 10*3/uL (ref 0.0–0.7)
Eosinophils Relative: 5 % (ref 0–5)
HEMATOCRIT: 37.4 % (ref 36.0–46.0)
HEMOGLOBIN: 13 g/dL (ref 12.0–15.0)
Lymphocytes Relative: 42 % (ref 12–46)
Lymphs Abs: 2.4 10*3/uL (ref 0.7–4.0)
MCH: 30 pg (ref 26.0–34.0)
MCHC: 34.8 g/dL (ref 30.0–36.0)
MCV: 86.4 fL (ref 78.0–100.0)
MONO ABS: 0.5 10*3/uL (ref 0.1–1.0)
MONOS PCT: 9 % (ref 3–12)
NEUTROS PCT: 43 % (ref 43–77)
Neutro Abs: 2.4 10*3/uL (ref 1.7–7.7)
Platelets: 184 10*3/uL (ref 150–400)
RBC: 4.33 MIL/uL (ref 3.87–5.11)
RDW: 14.6 % (ref 11.5–15.5)
WBC: 5.6 10*3/uL (ref 4.0–10.5)

## 2013-06-25 MED ORDER — NITROFURANTOIN MONOHYD MACRO 100 MG PO CAPS: 100.0000 mg | ORAL_CAPSULE | Freq: Two times a day (BID) | ORAL | Status: AC

## 2013-06-25 MED ORDER — SULFAMETHOXAZOLE-TMP DS 800-160 MG PO TABS: 1.0000 | ORAL_TABLET | Freq: Two times a day (BID) | ORAL | Status: DC

## 2013-06-25 NOTE — Patient Instructions (Signed)
Hematuria, Adult Hematuria is blood in your urine. It can be caused by a bladder infection, kidney infection, prostate infection, kidney stone, or cancer of your urinary tract. Infections can usually be treated with medicine, and a kidney stone usually will pass through your urine. If neither of these is the cause of your hematuria, further workup to find out the reason may be needed. It is very important that you tell your health care provider about any blood you see in your urine, even if the blood stops without treatment or happens without causing pain. Blood in your urine that happens and then stops and then happens again can be a symptom of a very serious condition. Also, pain is not a symptom in the initial stages of many urinary cancers. HOME CARE INSTRUCTIONS   Drink lots of fluid, 3 4 quarts a day. If you have been diagnosed with an infection, cranberry juice is especially recommended, in addition to large amounts of water.  Avoid caffeine, tea, and carbonated beverages, because they tend to irritate the bladder.  Avoid alcohol because it may irritate the prostate.  Only take over-the-counter or prescription medicines for pain, discomfort, or fever as directed by your health care provider.  If you have been diagnosed with a kidney stone, follow your health care provider's instructions regarding straining your urine to catch the stone.  Empty your bladder often. Avoid holding urine for long periods of time.  After a bowel movement, women should cleanse front to back. Use each tissue only once.  Empty your bladder before and after sexual intercourse if you are a female. SEEK MEDICAL CARE IF: You develop back pain, fever, a feeling of sickness in your stomach (nausea), or vomiting or if your symptoms are not better in 3 days. Return sooner if you are getting worse. SEEK IMMEDIATE MEDICAL CARE IF:   You have a persistent fever, with a temperature of 101.108F (38.8C) or greater.  You  develop severe vomiting and are unable to keep the medicine down.  You develop severe back or abdominal pain despite taking your medicines.  You begin passing a large amount of blood or clots in your urine.  You feel extremely weak or faint, or you pass out. MAKE SURE YOU:   Understand these instructions.  Will watch your condition.  Will get help right away if you are not doing well or get worse. Document Released: 03/07/2005 Document Revised: 12/26/2012 Document Reviewed: 11/05/2012 [facility] Patient Information 2014 [place]. Diabetes and Foot Care Diabetes may cause you to have problems because of poor blood supply (circulation) to your feet and legs. This may cause the skin on your feet to become thinner, break easier, and heal more slowly. Your skin may become dry, and the skin may peel and crack. You may also have nerve damage in your legs and feet causing decreased feeling in them. You may not notice minor injuries to your feet that could lead to infections or more serious problems. Taking care of your feet is one of the most important things you can do for yourself.  HOME CARE INSTRUCTIONS  Wear shoes at all times, even in the house. Do not go barefoot. Bare feet are easily injured.  Check your feet daily for blisters, cuts, and redness. If you cannot see the bottom of your feet, use a mirror or ask someone for help.  Wash your feet with warm water (do not use hot water) and mild soap. Then pat your feet and the areas between  your toes until they are completely dry. Do not soak your feet as this can dry your skin.  Apply a moisturizing lotion or petroleum jelly (that does not contain alcohol and is unscented) to the skin on your feet and to dry, brittle toenails. Do not apply lotion between your toes.  Trim your toenails straight across. Do not dig under them or around the cuticle. File the edges of your nails with an emery board or nail file.  Do not cut corns or  calluses or try to remove them with medicine.  Wear clean socks or stockings every day. Make sure they are not too tight. Do not wear knee-high stockings since they may decrease blood flow to your legs.  Wear shoes that fit properly and have enough cushioning. To break in new shoes, wear them for just a few hours a day. This prevents you from injuring your feet. Always look in your shoes before you put them on to be sure there are no objects inside.  Do not cross your legs. This may decrease the blood flow to your feet.  If you find a minor scrape, cut, or break in the skin on your feet, keep it and the skin around it clean and dry. These areas may be cleansed with mild soap and water. Do not cleanse the area with peroxide, alcohol, or iodine.  When you remove an adhesive bandage, be sure not to damage the skin around it.  If you have a wound, look at it several times a day to make sure it is healing.  Do not use heating pads or hot water bottles. They may burn your skin. If you have lost feeling in your feet or legs, you may not know it is happening until it is too late.  Make sure your health care provider performs a complete foot exam at least annually or more often if you have foot problems. Report any cuts, sores, or bruises to your health care provider immediately. SEEK MEDICAL CARE IF:   You have an injury that is not healing.  You have cuts or breaks in the skin.  You have an ingrown nail.  You notice redness on your legs or feet.  You feel burning or tingling in your legs or feet.  You have pain or cramps in your legs and feet.  Your legs or feet are numb.  Your feet always feel cold. SEEK IMMEDIATE MEDICAL CARE IF:   There is increasing redness, swelling, or pain in or around a wound.  There is a red line that goes up your leg.  Pus is coming from a wound.  You develop a fever or as directed by your health care provider.  You notice a bad smell coming from an  ulcer or wound. Document Released: 03/04/2000 Document Revised: 11/07/2012 Document Reviewed: 08/14/2012 [facility] Patient Information 2014 [place].

## 2013-06-25 NOTE — Telephone Encounter (Signed)
Stephanie from [facility] called about drug interaction Bactrim & Enalapril. Please advise

## 2013-06-25 NOTE — Progress Notes (Signed)
Patient ID: Kristine Burnett, female   DOB: 10/22/1929, 80 y.o.   MRN: [id number] Subjective:   Kristine Burnett is a 80 y.o. female who presents for Medicare Annual Wellness Visit and 3 month follow up on hypertension, DIET controlled diabetes, hyperlipidemia, vitamin D def.  Date of last medicare wellness visit is unknown.   She was recently treated for UTI with antibiotics with resolution of symptoms until this week. She has restarted with the pink urine and dysuria. She denies hematuria/ incontinence. She has decreased caffeine/ spicy foods.   Her blood pressure has been controlled at home, today their BP is BP: 146/82 mmHg She does workout. She denies chest pain, shortness of breath, dizziness.  She is on cholesterol medication and denies myalgias. Her cholesterol is at goal. The cholesterol last visit was:   Lab Results  Component Value Date   CHOL 161 03/22/2013   HDL 56 03/22/2013   LDLCALC 93 03/22/2013   TRIG 58 03/22/2013   CHOLHDL 2.9 03/22/2013   She has been working on diet and exercise for DIET controlled diabetes, and denies foot ulcerations, hypoglycemia , paresthesia of the feet, visual disturbances and weight loss. Last A1C in the office was:  Lab Results  Component Value Date   HGBA1C 6.9* 03/22/2013   Patient is on Vitamin D supplement.  Names of Other Physician/Practitioners you currently use: Patient Care Team: Unk Pinto, MD as PCP - General (Internal Medicine) Fabio Pierce, MD as Consulting Physician (Ophthalmology) Lafayette Dragon, MD as Consulting Physician (Gastroenterology) NO DENTIST with Dentures  Medication Review Current Outpatient Prescriptions on File Prior to Visit  Medication Sig Dispense Refill  . alendronate (FOSAMAX) 70 MG tablet Take 70 mg by mouth once a week. Take with a full glass of water on an empty stomach.      Marland Kitchen aspirin 325 MG tablet Take 325 mg by mouth daily.      Marland Kitchen atenolol (TENORMIN) 100 MG tablet Take 100 mg by mouth daily.      . baclofen  (LIORESAL) 10 MG tablet       . Cholecalciferol (VITAMIN D-3) 1000 UNITS CAPS Take 4,000 Units by mouth daily.      Marland Kitchen diltiazem (CARDIZEM) 120 MG tablet Take 120 mg by mouth 2 (two) times daily.      . enalapril (VASOTEC) 20 MG tablet Take 1 tablet (20 mg total) by mouth 2 (two) times daily.  60 tablet  6  . Flaxseed, Linseed, (FLAXSEED OIL) 1000 MG CAPS Take 1,000 mg by mouth 4 (four) times daily.      . hydrochlorothiazide (HYDRODIURIL) 25 MG tablet Take 12.5 mg by mouth daily.      . Magnesium Oxide (MAG-OX PO) Take 500 mg by mouth 2 (two) times daily.      . Multiple Vitamins-Minerals (MULTIVITAMIN & MINERAL PO) Take by mouth daily.      Marland Kitchen oxybutynin (DITROPAN) 5 MG tablet       . simvastatin (ZOCOR) 40 MG tablet Take 40 mg by mouth daily.      . vitamin B-12 (CYANOCOBALAMIN) 1000 MCG tablet Take 2,500 mcg by mouth daily.       No current facility-administered medications on file prior to visit.   Allergies  Allergen Reactions  . Ace Inhibitors Cough  . Crestor [Rosuvastatin]   . Grapefruit Extract   . Minoxidil   . Penicillins     Current Problems (verified) Patient Active Problem List   Diagnosis Date Noted  .  Unspecified essential hypertension 03/22/2013  . Mixed hyperlipidemia 03/22/2013  . Unspecified vitamin D deficiency 03/22/2013  . Other abnormal glucose 03/22/2013  . Extrinsic asthma, unspecified 03/22/2013    Screening Tests Health Maintenance  Topic Date Due  . Ophthalmology Exam  08/19/1941  . Urine Microalbumin  08/19/1941  . Zostavax  08/20/1991  . Influenza Vaccine  10/19/2013  . Hemoglobin A1c  12/25/2013  . Foot Exam  06/26/2014  . Tetanus/tdap  03/22/2015  . Colonoscopy  03/21/2018  . Pneumococcal Polysaccharide Vaccine Age 81 And Over  Completed     Immunization History  Administered Date(s) Administered  . Influenza Whole 12/13/2012  . Pneumococcal Polysaccharide-23 03/21/2002  . Tdap 03/21/2005    Preventative care: Last colonoscopy:  2010 Last mammogram: 07/02/12 Last pap smear/pelvic exam:declines   DEXA:04/14/11 osteoporosis EYE: 06/17/12, cataracts stable  Prior vaccinations: TD or Tdap: 2007  Influenza: 11/2012 Pneumococcal: 2004/ 2015 Shingles/Zostavax:Declines due to cost   History reviewed:  Past Medical History  Diagnosis Date  . Hypertension   . Hyperlipidemia   . Diabetes mellitus without complication   . Vitamin D deficiency   . Asthma   . DDD (degenerative disc disease)   . Spinal stenosis    History  Substance Use Topics  . Smoking status: Never Smoker   . Smokeless tobacco: Not on file  . Alcohol Use: No   Past Surgical History  Procedure Laterality Date  . Hernia repair    . Cyst excision  back   Family History  Problem Relation Age of Onset  . Heart attack Mother   . Cancer Mother     breast  . Hypertension Mother   . Heart attack Father   . Stroke Sister   . Cancer Sister     breast  . Heart attack Brother   . Heart attack Brother      Risk Factors: Osteoporosis: postmenopausal estrogen deficiency History of fracture in the past year: no  Tobacco History  Substance Use Topics  . Smoking status: Never Smoker   . Smokeless tobacco: Not on file  . Alcohol Use: No   She does not smoke.  Patient is a former smoker. Are there smokers in your home (other than you)?  No  Alcohol Current alcohol use: none  Caffeine Current caffeine use: coffee 1-2 /day  Exercise Exercise limitations: The patient is experiencing exercise intolerance (difficulty walking 10 feet on flat ground). Current exercise: housecleaning and walking  Nutrition/Diet Current diet: in general, a "healthy" diet    Cardiac risk factors: advanced age (older than 29 for men, 36 for women), diabetes mellitus and dyslipidemia.  Depression Screen Nurse depression screen reviewed.  (Note: if answer to either of the following is "Yes", a more complete depression screening is indicated)   Q1: Over the  past two weeks, have you felt down, depressed or hopeless? No  Q2: Over the past two weeks, have you felt little interest or pleasure in doing things? No  Have you lost interest or pleasure in daily life? No  Do you often feel hopeless? No  Do you cry easily over simple problems? No  Activities of Daily Living Nurse ADLs screen reviewed.  In your present state of health, do you have any difficulty performing the following activities?:  Driving? No Managing money?  No Feeding yourself? No Getting from bed to chair? No Climbing a flight of stairs? Yes Preparing food and eating?: No Bathing or showering? No Getting dressed: No Getting to the toilet? No  Using the toilet:No Moving around from place to place: No, uses walker In the past year have you fallen or had a near fall?:No   Are you sexually active?  No  Do you have more than one partner?  No  Vision Difficulties: No  Hearing Difficulties: No Do you often ask people to speak up or repeat themselves? No Do you experience ringing or noises in your ears? No Do you have difficulty understanding soft or whispered voices? No  Cognition  Do you feel that you have a problem with memory?No  Do you often misplace items? No  Do you feel safe at home?  Yes  Advanced directives Does patient have a [place]? No Does patient have a Living Will? No    Objective:     Vision and hearing screens reviewed.   Blood pressure 146/82, pulse 56, temperature 98 F (36.7 C), temperature source Temporal, resp. rate 16, height 5\' 6"  (1.676 m), weight 182 lb (82.555 kg). Body mass index is 29.39 kg/(m^2).  General appearance: alert, no distress, WD/WN,  female Cognitive Testing  Alert? Yes  Normal Appearance?Yes  Oriented to person? Yes  Place? Yes   Time? Yes  Recall of three objects?  Yes  Can perform simple calculations? Yes  Displays appropriate judgment?Yes  Can read the correct time from a watch  face?Yes  HEENT: normocephalic, sclerae anicteric, TMs pearly, nares patent, no discharge or erythema, pharynx normal Oral cavity: MMM, no lesions, Dentures Neck: supple, no lymphadenopathy, no thyromegaly, no masses Heart: RRR, normal S1, S2, no murmurs Lungs: CTA bilaterally, no wheezes, rhonchi, or rales Abdomen: +bs, soft, non tender, non distended, no masses, no hepatomegaly, no splenomegaly Musculoskeletal: nontender, no swelling, no obvious deformity, uses walker to steady gait Extremities: no edema, no cyanosis, no clubbing Pulses: 2+ symmetric, upper and lower extremities, normal cap refill SKIN: Dry skin both, thick nails great toe nails Neurological: alert, oriented x 3, CN2-12 intact, strength normal upper extremities and lower extremities, sensation normal throughout, DTRs 2+ throughout, no cerebellar signs, gait normal Psychiatric: normal affect, behavior normal, pleasant  Breast: DEF  Gyn: Def  Rectal:  DEF   Assessment:  1. Medicare Wellness update- Update screening labs/ History/ Immunizations/ Testing as needed. Advised healthy diet, QD exercise, increase H20 and continue RX/ Vitamins AD.  2.  3 month F/U for HTN, Cholesterol,DM, D. Deficient. Needs healthy diet, cardio QD and obtain healthy weight. Check Labs, Check BP if >130/80 call office, Check BS if >200 call office.  3. Hematuria recurrent- Check labs ref Urology, Bactrim DS or macrobid 100 mg AD, pending insurance coverage.  4. Nail fungus great toes/ dry skin DM- Advised needs podiatry evaluation with  DM Hx, epsom salt soaks, vaseline treatment QD, monitor QD and call with any concerns/ adverse changes  Plan:  Also see above  During the course of the visit the patient was educated and counseled about appropriate screening and preventive services including:    Pneumococcal vaccine   Screening mammography  Bone densitometry screening  Diabetes screening  Nutrition counseling   Advanced directives:  has NO advanced directive - not interested in additional information  Screening recommendations, referrals:  Vaccinations: Tdap vaccine no  /up to date  Influenza vaccine no  /up to date  Pneumococcal vaccine yes, she will get at next OV we are out. Shingles vaccine no Declines due to cost Hep B vaccine no  Nutrition assessed and recommended  Colonoscopy no  /up to date  Mammogram yes Pap smear no DECLINEs Pelvic exam no Declines Recommended yearly ophthalmology/optometry visit for glaucoma screening and checkup  Yes /up to date Recommended yearly dental visit for hygiene and checkup Advanced directives - yes  Conditions/risks identified: BMI: Discussed weight loss, diet, and increase physical activity.  Increase physical activity: AHA recommends 150 minutes of physical activity a week.  Medications reviewed DEXA- ordered Diabetes is at goal, ACE/ARB therapy: Yes.  Urinary Incontinence is not an issue: discussed non pharmacology and pharmacology options.  Fall risk: moderate- discussed PT, home fall assessment, medications.   Medicare Attestation I have personally reviewed: The patient's medical and social history Their use of alcohol, tobacco or illicit drugs Their current medications and supplements The patient's functional ability including ADLs,fall risks, home safety risks, cognitive, and hearing and visual impairment Diet and physical activities Evidence for depression or mood disorders  The patient's weight, height, BMI, and visual acuity have been recorded in the chart.  I have made referrals, counseling, and provided education to the patient based on review of the above and I have provided the patient with a written personalized care plan for preventive services.     Ardis Hughs, PA-C   \ CPT [phone] first AWV CPT [phone] subsequent AWV

## 2013-06-26 ENCOUNTER — Encounter: Payer: Self-pay | Admitting: Emergency Medicine

## 2013-06-26 LAB — LIPID PANEL
CHOL/HDL RATIO: 2.8 ratio
Cholesterol: 153 mg/dL (ref 0–200)
HDL: 54 mg/dL (ref >39)
LDL Cholesterol: 89 mg/dL (ref 0–99)
Triglycerides: 52 mg/dL (ref <150)
VLDL: 10 mg/dL (ref 0–40)

## 2013-06-26 LAB — HEPATIC FUNCTION PANEL
ALK PHOS: 61 U/L (ref 39–117)
ALT: 12 U/L (ref 0–35)
AST: 22 U/L (ref 0–37)
Albumin: 4 g/dL (ref 3.5–5.2)
BILIRUBIN TOTAL: 0.4 mg/dL (ref 0.2–1.2)
Bilirubin, Direct: 0.1 mg/dL (ref 0.0–0.3)
Indirect Bilirubin: 0.3 mg/dL (ref 0.2–1.2)
TOTAL PROTEIN: 7.7 g/dL (ref 6.0–8.3)

## 2013-06-26 LAB — URINE CULTURE: Colony Count: 50000

## 2013-06-26 LAB — URINALYSIS, ROUTINE W REFLEX MICROSCOPIC
Bilirubin Urine: NEGATIVE
Glucose, UA: NEGATIVE mg/dL
Ketones, ur: NEGATIVE mg/dL
NITRITE: NEGATIVE
PH: 8.5 — AB (ref 5.0–8.0)
PROTEIN: 30 mg/dL — AB
Specific Gravity, Urine: <1.005 — ABNORMAL LOW (ref 1.005–1.030)
Urobilinogen, UA: 0.2 mg/dL (ref 0.0–1.0)

## 2013-06-26 LAB — URINALYSIS, MICROSCOPIC ONLY
Bacteria, UA: NONE SEEN
Casts: NONE SEEN

## 2013-06-26 LAB — BASIC METABOLIC PANEL WITH GFR
BUN: 18 mg/dL (ref 6–23)
CHLORIDE: 99 meq/L (ref 96–112)
CO2: 27 mEq/L (ref 19–32)
Calcium: 10.2 mg/dL (ref 8.4–10.5)
Creat: 0.83 mg/dL (ref 0.50–1.10)
GFR, EST AFRICAN AMERICAN: 76 mL/min
GFR, EST NON AFRICAN AMERICAN: 66 mL/min
Glucose, Bld: 109 mg/dL — ABNORMAL HIGH (ref 70–99)
Potassium: 3.9 mEq/L (ref 3.5–5.3)
SODIUM: 140 meq/L (ref 135–145)

## 2013-06-26 LAB — INSULIN, FASTING: Insulin fasting, serum: 10 u[IU]/mL (ref 3–28)

## 2013-06-26 LAB — HEMOGLOBIN A1C
HEMOGLOBIN A1C: 7 % — AB (ref <5.7)
Mean Plasma Glucose: 154 mg/dL — ABNORMAL HIGH (ref <117)

## 2013-06-27 ENCOUNTER — Other Ambulatory Visit: Payer: Self-pay | Admitting: Internal Medicine

## 2013-06-27 ENCOUNTER — Other Ambulatory Visit: Payer: Self-pay | Admitting: *Deleted

## 2013-06-27 ENCOUNTER — Other Ambulatory Visit: Payer: Self-pay | Admitting: Emergency Medicine

## 2013-06-27 MED ORDER — METFORMIN HCL 500 MG PO TABS: 500.0000 mg | ORAL_TABLET | Freq: Two times a day (BID) | ORAL | Status: DC

## 2013-07-09 ENCOUNTER — Ambulatory Visit (INDEPENDENT_AMBULATORY_CARE_PROVIDER_SITE_OTHER): Payer: Medicare PPO | Admitting: *Deleted

## 2013-07-09 DIAGNOSIS — E119 Type 2 diabetes mellitus without complications: Secondary | ICD-10-CM

## 2013-07-09 DIAGNOSIS — Z23 Encounter for immunization: Secondary | ICD-10-CM

## 2013-07-09 DIAGNOSIS — L609 Nail disorder, unspecified: Secondary | ICD-10-CM

## 2013-07-09 LAB — POCT GLUCOSE (DEVICE FOR HOME USE): POC GLUCOSE: 132 mg/dL — AB (ref 70–99)

## 2013-07-09 NOTE — Progress Notes (Signed)
Patient ID: Kristine Burnett, female   DOB: 22-Sep-1929, 80 y.o.   MRN: [id number] Patient presents today for nurse visit for glucometer training and diabetic education.  Patient received nutrition information, books, and handouts.  Patient also received Freestyle glucometer to check BS levels and record readings daily.

## 2013-07-10 ENCOUNTER — Other Ambulatory Visit: Payer: Self-pay | Admitting: Internal Medicine

## 2013-07-10 ENCOUNTER — Ambulatory Visit (HOSPITAL_COMMUNITY)
Admission: RE | Admit: 2013-07-10 | Discharge: 2013-07-10 | Disposition: A | Discharge status: Home / Self Care | Payer: Medicare PPO | Source: Ambulatory Visit | Attending: Internal Medicine | Admitting: Internal Medicine

## 2013-07-10 DIAGNOSIS — Z0000 Encounter for general adult medical examination without abnormal findings: Secondary | ICD-10-CM

## 2013-07-10 DIAGNOSIS — Z1231 Encounter for screening mammogram for malignant neoplasm of breast: Secondary | ICD-10-CM

## 2013-07-19 ENCOUNTER — Encounter: Payer: Self-pay | Admitting: Podiatrist

## 2013-07-19 ENCOUNTER — Ambulatory Visit (INDEPENDENT_AMBULATORY_CARE_PROVIDER_SITE_OTHER): Payer: Commercial Managed Care - HMO | Admitting: Podiatrist

## 2013-07-19 VITALS — BP 144/69 | HR 46 | Resp 18

## 2013-07-19 DIAGNOSIS — B351 Tinea unguium: Secondary | ICD-10-CM

## 2013-07-19 DIAGNOSIS — M79609 Pain in unspecified limb: Secondary | ICD-10-CM

## 2013-07-19 NOTE — Patient Instructions (Signed)
Diabetes and Foot Care Diabetes may cause you to have problems because of poor blood supply (circulation) to your feet and legs. This may cause the skin on your feet to become thinner, break easier, and heal more slowly. Your skin may become dry, and the skin may peel and crack. You may also have nerve damage in your legs and feet causing decreased feeling in them. You may not notice minor injuries to your feet that could lead to infections or more serious problems. Taking care of your feet is one of the most important things you can do for yourself.  HOME CARE INSTRUCTIONS  Wear shoes at all times, even in the house. Do not go barefoot. Bare feet are easily injured.  Check your feet daily for blisters, cuts, and redness. If you cannot see the bottom of your feet, use a mirror or ask someone for help.  Wash your feet with warm water (do not use hot water) and mild soap. Then pat your feet and the areas between your toes until they are completely dry. Do not soak your feet as this can dry your skin.  Apply a moisturizing lotion or petroleum jelly (that does not contain alcohol and is unscented) to the skin on your feet and to dry, brittle toenails. Do not apply lotion between your toes.  Trim your toenails straight across. Do not dig under them or around the cuticle. File the edges of your nails with an emery board or nail file.  Do not cut corns or calluses or try to remove them with medicine.  Wear clean socks or stockings every day. Make sure they are not too tight. Do not wear knee-high stockings since they may decrease blood flow to your legs.  Wear shoes that fit properly and have enough cushioning. To break in new shoes, wear them for just a few hours a day. This prevents you from injuring your feet. Always look in your shoes before you put them on to be sure there are no objects inside.  Do not cross your legs. This may decrease the blood flow to your feet.  If you find a minor scrape,  cut, or break in the skin on your feet, keep it and the skin around it clean and dry. These areas may be cleansed with mild soap and water. Do not cleanse the area with peroxide, alcohol, or iodine.  When you remove an adhesive bandage, be sure not to damage the skin around it.  If you have a wound, look at it several times a day to make sure it is healing.  Do not use heating pads or hot water bottles. They may burn your skin. If you have lost feeling in your feet or legs, you may not know it is happening until it is too late.  Make sure your health care provider performs a complete foot exam at least annually or more often if you have foot problems. Report any cuts, sores, or bruises to your health care provider immediately. SEEK MEDICAL CARE IF:   You have an injury that is not healing.  You have cuts or breaks in the skin.  You have an ingrown nail.  You notice redness on your legs or feet.  You feel burning or tingling in your legs or feet.  You have pain or cramps in your legs and feet.  Your legs or feet are numb.  Your feet always feel cold. SEEK IMMEDIATE MEDICAL CARE IF:   There is increasing redness,   swelling, or pain in or around a wound.  There is a red line that goes up your leg.  Pus is coming from a wound.  You develop a fever or as directed by your health care provider.  You notice a bad smell coming from an ulcer or wound. Document Released: 03/04/2000 Document Revised: 11/07/2012 Document Reviewed: 08/14/2012 [employer] Patient Information 2014 [employer]  

## 2013-07-19 NOTE — Progress Notes (Signed)
   Subjective:    Patient ID: Kristine Burnett, female    DOB: 19-Nov-1929, 80 y.o.   MRN: [id number]  HPI my big toenails are thick and discolored and been going since 2000 on the right and the left big toenail about a year     Review of Systems  All other systems reviewed and are negative.      Objective:   Physical Exam Patient is awake, alert, and oriented x 3.  In no acute distress.  Neurovascular status is intact with palpable pedal pulses at 2/4 DP and PT bilateral and capillary refill time within normal limits. Neurological sensation is also intact bilaterally both epicritically and protectively. Dermatological exam reveals skin color, turger and texture as normal. No open lesions present.   Musculoskeletal examination reveals good muscle, strength, tone and stability. Musculature intact with dorsiflexion, plantarflexion, inversion, eversion. Great toenails are thick, discolored and dystrophic.  They are painful with palpation. They do not appear infected.    Assessment & Plan:  Symptomatic toenails Plan:  Recommended debridement of toenails. This was carried out today without complication.

## 2013-08-03 ENCOUNTER — Other Ambulatory Visit: Payer: Self-pay | Admitting: Internal Medicine

## 2013-08-16 ENCOUNTER — Other Ambulatory Visit: Payer: Self-pay | Admitting: *Deleted

## 2013-08-16 MED ORDER — FREESTYLE LANCETS MISC: Status: DC

## 2013-09-02 ENCOUNTER — Other Ambulatory Visit: Payer: Self-pay | Admitting: Internal Medicine

## 2013-09-02 ENCOUNTER — Other Ambulatory Visit: Payer: Self-pay | Admitting: Emergency Medicine

## 2013-09-17 ENCOUNTER — Encounter: Payer: Self-pay | Admitting: Internal Medicine

## 2013-09-17 ENCOUNTER — Ambulatory Visit (INDEPENDENT_AMBULATORY_CARE_PROVIDER_SITE_OTHER): Payer: Commercial Managed Care - HMO | Admitting: Internal Medicine

## 2013-09-17 VITALS — BP 196/82 | HR 64 | Temp 99.0°F | Resp 16 | Ht 66.5 in | Wt 173.6 lb

## 2013-09-17 DIAGNOSIS — E1129 Type 2 diabetes mellitus with other diabetic kidney complication: Secondary | ICD-10-CM

## 2013-09-17 DIAGNOSIS — I10 Essential (primary) hypertension: Secondary | ICD-10-CM

## 2013-09-17 DIAGNOSIS — E559 Vitamin D deficiency, unspecified: Secondary | ICD-10-CM

## 2013-09-17 DIAGNOSIS — E782 Mixed hyperlipidemia: Secondary | ICD-10-CM

## 2013-09-17 DIAGNOSIS — E538 Deficiency of other specified B group vitamins: Secondary | ICD-10-CM

## 2013-09-17 DIAGNOSIS — Z1212 Encounter for screening for malignant neoplasm of rectum: Secondary | ICD-10-CM

## 2013-09-17 DIAGNOSIS — Z79899 Other long term (current) drug therapy: Secondary | ICD-10-CM

## 2013-09-17 DIAGNOSIS — Z789 Other specified health status: Secondary | ICD-10-CM

## 2013-09-17 DIAGNOSIS — Z1331 Encounter for screening for depression: Secondary | ICD-10-CM

## 2013-09-17 DIAGNOSIS — Z0000 Encounter for general adult medical examination without abnormal findings: Secondary | ICD-10-CM

## 2013-09-17 LAB — CBC WITH DIFFERENTIAL/PLATELET
BASOS PCT: 1 % (ref 0–1)
Basophils Absolute: 0.1 10*3/uL (ref 0.0–0.1)
Eosinophils Absolute: 0.2 10*3/uL (ref 0.0–0.7)
Eosinophils Relative: 3 % (ref 0–5)
HCT: 36.2 % (ref 36.0–46.0)
HEMOGLOBIN: 12.3 g/dL (ref 12.0–15.0)
LYMPHS ABS: 2.9 10*3/uL (ref 0.7–4.0)
Lymphocytes Relative: 52 % — ABNORMAL HIGH (ref 12–46)
MCH: 30.8 pg (ref 26.0–34.0)
MCHC: 34 g/dL (ref 30.0–36.0)
MCV: 90.7 fL (ref 78.0–100.0)
Monocytes Absolute: 0.6 10*3/uL (ref 0.1–1.0)
Monocytes Relative: 11 % (ref 3–12)
NEUTROS PCT: 33 % — AB (ref 43–77)
Neutro Abs: 1.8 10*3/uL (ref 1.7–7.7)
Platelets: 201 10*3/uL (ref 150–400)
RBC: 3.99 MIL/uL (ref 3.87–5.11)
RDW: 15.2 % (ref 11.5–15.5)
WBC: 5.5 10*3/uL (ref 4.0–10.5)

## 2013-09-17 LAB — HEMOGLOBIN A1C
HEMOGLOBIN A1C: 6.1 % — AB (ref <5.7)
Mean Plasma Glucose: 128 mg/dL — ABNORMAL HIGH (ref <117)

## 2013-09-17 NOTE — Patient Instructions (Signed)
Recommend the book "the END of DIETING" by Dr Baker Janus   and the  Book "The END of DIABETES " by Dr Excell Seltzer  At [email or web address] - get book & Audio CD's      Being diabetic has a  300% increased risk for heart attack, stroke, cancer, and alzheimer- type vascular dementia. It is very important that you work harder with diet by avoiding all foods that are white except chicken & fish. Avoid white rice (brown & wild rice is OK), white potatoes (sweetpotatoes in moderation is OK), White bread or wheat bread or anything made out of white flour like bagels, donuts, rolls, buns, biscuits, cakes, pastries, cookies, pizza crust, and pasta (made from white flour & egg whites) - vegetarian pasta or spinach or wheat pasta is OK. Multigrain breads like Arnold's or [employer], or multigrain sandwich thins or flatbreads.  Diet, exercise and weight loss can reverse and cure diabetes in the early stages.  Diet, exercise and weight loss is very important in the control and prevention of complications of diabetes which affects every system in your body, ie. Brain - dementia/stroke, eyes - glaucoma/blindness, heart - heart attack/heart failure, kidneys - dialysis, stomach - gastric paralysis, intestines - malabsorption, nerves - severe painful neuritis, circulation - gangrene & loss of a leg(s), and finally cancer and Alzheimers.    I recommend avoid fried & greasy foods,  sweets/candy, white rice (brown or wild rice or Quinoa is OK), white potatoes (sweet potatoes are OK) - anything made from white flour - bagels, doughnuts, rolls, buns, biscuits,white and wheat breads, pizza crust and traditional pasta made of white flour & egg white(vegetarian pasta or spinach or wheat pasta is OK).  Multi-grain bread is OK - like multi-grain flat bread or sandwich thins. Avoid alcohol in excess. Exercise is also important.    Eat all the vegetables you want - avoid meat, especially red meat and dairy - especially cheese.  Cheese is  the most concentrated form of trans-fats which is the worst thing to clog up our arteries. Veggie cheese is OK which can be found in the fresh produce section at [employer] or [employer] or [employer]   Preventative Care for Adults - Female       MAINTAIN REGULAR HEALTH EXAMS:   A routine yearly physical is a good way to check in with your primary care provider about your health and preventive screening. It is also an opportunity to share updates about your health and any concerns you have, and receive a thorough all-over exam.   Most health insurance companies pay for at least some preventative services.  Check with your health plan for specific coverages.  WHAT PREVENTATIVE SERVICES DO WOMEN NEED?  Adult women should have their weight and blood pressure checked regularly.   Women age 60 and older should have their cholesterol levels checked regularly.  Women should be screened for cervical cancer with a Pap smear and pelvic exam beginning at either age 18, or 3 years after they become sexually activity.    Breast cancer screening generally begins at age 99 with a mammogram and breast exam by your primary care provider.    Beginning at age 5 and continuing to age 78, women should be screened for colorectal cancer.  Certain people may need continued testing until age 41.  Updating vaccinations is part of preventative care.  Vaccinations help protect against diseases such as the flu.  Osteoporosis is a disease in which the bones lose  minerals and strength as we age. Women ages 57 and over should discuss this with their caregivers, as should women after menopause who have other risk factors.  Lab tests are generally done as part of preventative care to screen for anemia and blood disorders, to screen for problems with the kidneys and liver, to screen for bladder problems, to check blood sugar, and to check your cholesterol level.  Preventative services generally include counseling  about diet, exercise, avoiding tobacco, drugs, excessive alcohol consumption, and sexually transmitted infections.    GENERAL RECOMMENDATIONS FOR GOOD HEALTH:  Healthy diet:  Eat a variety of foods, including fruit, vegetables, animal or vegetable protein, such as meat, fish, chicken, and eggs, or beans, lentils, tofu, and grains, such as rice.  Drink plenty of water daily.  Decrease saturated fat in the diet, avoid lots of red meat, processed foods, sweets, fast foods, and fried foods.  Exercise:  Aerobic exercise helps maintain good heart health. At least 30-40 minutes of moderate-intensity exercise is recommended. For example, a brisk walk that increases your heart rate and breathing. This should be done on most days of the week.   Find a type of exercise or a variety of exercises that you enjoy so that it becomes a part of your daily life.  Examples are running, walking, swimming, water aerobics, and biking.  For motivation and support, explore group exercise such as aerobic class, spin class, Zumba, Yoga,or  martial arts, etc.    Set exercise goals for yourself, such as a certain weight goal, walk or run in a race such as a 5k walk/run.  Speak to your primary care provider about exercise goals.  Disease prevention:  If you smoke or chew tobacco, find out from your caregiver how to quit. It can literally save your life, no matter how long you have been a tobacco user. If you do not use tobacco, never begin.   Maintain a healthy diet and normal weight. Increased weight leads to problems with blood pressure and diabetes.   The Body Mass Index or BMI is a way of measuring how much of your body is fat. Having a BMI above 27 increases the risk of heart disease, diabetes, hypertension, stroke and other problems related to obesity. Your caregiver can help determine your BMI and based on it develop an exercise and dietary program to help you achieve or maintain this important measurement at a  healthful level.  High blood pressure causes heart and blood vessel problems.  Persistent high blood pressure should be treated with medicine if weight loss and exercise do not work.   Fat and cholesterol leaves deposits in your arteries that can block them. This causes heart disease and vessel disease elsewhere in your body.  If your cholesterol is found to be high, or if you have heart disease or certain other medical conditions, then you may need to have your cholesterol monitored frequently and be treated with medication.   Ask if you should have a cardiac stress test if your history suggests this. A stress test is a test done on a treadmill that looks for heart disease. This test can find disease prior to there being a problem.  Menopause can be associated with physical symptoms and risks. Hormone replacement therapy is available to decrease these. You should talk to your caregiver about whether starting or continuing to take hormones is right for you.   Osteoporosis is a disease in which the bones lose minerals and strength as we  age. This can result in serious bone fractures. Risk of osteoporosis can be identified using a bone density scan. Women ages 40 and over should discuss this with their caregivers, as should women after menopause who have other risk factors. Ask your caregiver whether you should be taking a calcium supplement and Vitamin D, to reduce the rate of osteoporosis.   Avoid drinking alcohol in excess (more than two drinks per day).  Avoid use of street drugs. Do not share needles with anyone. Ask for professional help if you need assistance or instructions on stopping the use of alcohol, cigarettes, and/or drugs.  Brush your teeth twice a day with fluoride toothpaste, and floss once a day. Good oral hygiene prevents tooth decay and gum disease. The problems can be painful, unattractive, and can cause other health problems. Visit your dentist for a routine oral and dental check  up and preventive care every 6-12 months.   Look at your skin regularly.  Use a mirror to look at your back. Notify your caregivers of changes in moles, especially if there are changes in shapes, colors, a size larger than a pencil eraser, an irregular border, or development of new moles.  Safety:  Use seatbelts 100% of the time, whether driving or as a passenger.  Use safety devices such as hearing protection if you work in environments with loud noise or significant background noise.  Use safety glasses when doing any work that could send debris in to the eyes.  Use a helmet if you ride a bike or motorcycle.  Use appropriate safety gear for contact sports.  Talk to your caregiver about gun safety.  Use sunscreen with a SPF (or skin protection factor) of 15 or greater.  Lighter skinned people are at a greater risk of skin cancer. Don't forget to also wear sunglasses in order to protect your eyes from too much damaging sunlight. Damaging sunlight can accelerate cataract formation.   Practice safe sex. Use condoms. Condoms are used for birth control and to help reduce the spread of sexually transmitted infections (or STIs).  Some of the STIs are gonorrhea (the clap), chlamydia, syphilis, trichomonas, herpes, HPV (human papilloma virus) and HIV (human immunodeficiency virus) which causes AIDS. The herpes, HIV and HPV are viral illnesses that have no cure. These can result in disability, cancer and death.   Keep carbon monoxide and smoke detectors in your home functioning at all times. Change the batteries every 6 months or use a model that plugs into the wall.   Vaccinations:  Stay up to date with your tetanus shots and other required immunizations. You should have a booster for tetanus every 10 years. Be sure to get your flu shot every year, since 5%-20% of the [place] population comes down with the flu. The flu vaccine changes each year, so being vaccinated once is not enough. Get your shot in the fall,  before the flu season peaks.   Other vaccines to consider:  Human Papilloma Virus or HPV causes cancer of the cervix, and other infections that can be transmitted from person to person. There is a vaccine for HPV, and females should get immunized between the ages of 52 and 92. It requires a series of 3 shots.   Pneumococcal vaccine to protect against certain types of pneumonia.  This is normally recommended for adults age 56 or older.  However, adults younger than 78 years old with certain underlying conditions such as diabetes, heart or lung disease should also receive the  vaccine.  Shingles vaccine to protect against Varicella Zoster if you are older than age 61, or younger than 78 years old with certain underlying illness.  Hepatitis A vaccine to protect against a form of infection of the liver by a virus acquired from food.  Hepatitis B vaccine to protect against a form of infection of the liver by a virus acquired from blood or body fluids, particularly if you work in health care.  If you plan to travel internationally, check with your local health department for specific vaccination recommendations.  Cancer Screening:  Breast cancer screening is essential to preventive care for women. All women age 36 and older should perform a breast self-exam every month. At age 11 and older, women should have their caregiver complete a breast exam each year. Women at ages 5 and older should have a mammogram (x-ray film) of the breasts. Your caregiver can discuss how often you need mammograms.    Cervical cancer screening includes taking a Pap smear (sample of cells examined under a microscope) from the cervix (end of the uterus). It also includes testing for HPV (Human Papilloma Virus, which can cause cervical cancer). Screening and a pelvic exam should begin at age 5, or 3 years after a woman becomes sexually active. Screening should occur every year, with a Pap smear but no HPV testing, up to age 26.  After age 45, you should have a Pap smear every 3 years with HPV testing, if no HPV was found previously.   Most routine colon cancer screening begins at the age of 67. On a yearly basis, doctors may provide special easy to use take-home tests to check for hidden blood in the stool. Sigmoidoscopy or colonoscopy can detect the earliest forms of colon cancer and is life saving. These tests use a small camera at the end of a tube to directly examine the colon. Speak to your caregiver about this at age 58, when routine screening begins (and is repeated every 5 years unless early forms of pre-cancerous polyps or small growths are found).

## 2013-09-17 NOTE — Progress Notes (Signed)
Patient ID: Kristine Burnett, female   DOB: Jun 25, 1929, 80 y.o.   MRN: [id number]   Annual Screening Comprehensive Examination  This very nice 80 y.o.WBF presents for complete physical.  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    HTN predates since 42. Patient's BP has not been controlled and today's BP: 196/82 mmHg. Patient denies any cardiac symptoms as chest pain, palpitations, shortness of breath, dizziness or ankle swelling.   Patient's hyperlipidemia is controlled with diet and medications. Patient denies myalgias or other medication SE's. 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 Patient denies reactive hypoglycemic symptoms, visual blurring, diabetic polys, or paresthesias. Patient does report some diarrhea since on Metformin.  Finally, patient has history of Vitamin D Deficiency  Of 37 in 2008 and last Vitamin D was 97 in Jan 2015   Medication Sig  . alendronate 70 MG tablet Take 70 mg by mouth once a week  . aspirin 325 MG tablet Take 325 mg bdaily.  Marland Kitchen atenolol  100 MG tablet Take 100 mg daily.  . baclofen  10 MG tablet TAKE 1/2 TO 1 TAB 3 x DAILY AS NEEDED  . VITAMIN D 1000 UNITS CAPS Take 4,000 U daily.  Marland Kitchen diltiazem  120 MG tab TAKE 1 TAB TWICE DAILY   . enalapril  20 MG tab TAKE 1 TAB TWICE DAILY  . FLAXSEED OIL 1000 MG  Take 1,000 mg  4  times daily.  . hctz  25 MG tablet Take 12.5 mg by mouth daily.  Marland Kitchen FREESTYLE lancets Test glucose 1 time daily  . Magnesium Oxide  Take 500 mg  2  times daily.  . metFORMIN  500 XR  MG tab Take 1 tablet  2 times daily  . Multiple Vitamins-Minerals  Take daily.  Marland Kitchen oxybutynin  5 MG tab TAKE 1 TAB TWICE DAILY   . simvastatin  40 MG tab TAKE 1 TAB  AT  BEDTIME    Allergies  Allergen Reactions  . Ace Inhibitors Cough  . Crestor [Rosuvastatin]   . Grapefruit Extract   . Minoxidil   . Penicillins    Past Medical History  Diagnosis Date  . Hypertension   . Hyperlipidemia   . Diabetes mellitus without complication   . Vitamin D deficiency   . Asthma   . DDD (degenerative disc disease)   . Spinal stenosis   . Type II or unspecified type diabetes mellitus without mention of complication, not stated as uncontrolled 03/22/2013   Past Surgical History  Procedure Laterality Date  . Hernia repair    . Cyst excision  back   Family History  Problem Relation Age of Onset  . Heart attack Mother   . Cancer Mother     breast  . Hypertension Mother   . Heart attack Father   . Stroke Sister   . Cancer Sister     breast  . Heart attack Brother   . Heart attack Brother    History  Substance Use Topics  . Smoking status: Never Smoker   . Smokeless tobacco: Not on file  . Alcohol Use: No    ROS Constitutional: Denies fever,  chills, weight loss/gain, headaches, insomnia, fatigue, night sweats, and change in appetite. Eyes: Denies redness, blurred vision, diplopia, discharge, itchy, watery eyes.  ENT: Denies discharge, congestion, post nasal drip, epistaxis, sore throat, earache, hearing loss, dental pain, Tinnitus, Vertigo, Sinus pain, snoring.  Cardio: Denies chest pain, palpitations, irregular heartbeat, syncope, dyspnea, diaphoresis, orthopnea, PND, claudication, edema Respiratory: denies cough, dyspnea, DOE, pleurisy, hoarseness, laryngitis, wheezing.  Gastrointestinal: Denies dysphagia, heartburn, reflux, water brash, pain, cramps, nausea, vomiting, bloating, diarrhea, constipation, hematemesis, melena, hematochezia, jaundice, hemorrhoids Genitourinary: Denies dysuria, frequency, urgency, nocturia, hesitancy, discharge, hematuria, flank pain Breast: Breast lumps, nipple discharge, bleeding.  Musculoskeletal: Denies arthralgia,  myalgia, stiffness, Jt. Swelling, pain, limp, and strain/sprain. Denies falls. Skin: Denies puritis, rash, hives, warts, acne, eczema, changing in skin lesion Neuro: No weakness, tremor, incoordination, spasms, paresthesia, pain Psychiatric: Denies confusion, memory loss, sensory loss. Denies Depression. Endocrine: Denies change in weight, skin, hair change, nocturia, and paresthesia, diabetic polys, visual blurring, hyper / hypo glycemic episodes.  Heme/Lymph: No excessive bleeding, bruising, enlarged lymph nodes.  Physical Exam  BP 196/82  P 64  T 99 F   R 16  Ht 5' 6.5"   Wt 173 lb 9.6 oz   BMI 27.60 kg/m2  General Appearance: Well nourished and in no apparent distress. Eyes: PERRLA, EOMs, conjunctiva no swelling or erythema, normal fundi and vessels. Sinuses: No frontal/maxillary tenderness ENT/Mouth: EACs patent / TMs  nl. Nares clear without erythema, swelling, mucoid exudates. Oral hygiene is good. No erythema, swelling, or exudate. Tongue normal, non-obstructing. Tonsils not swollen or erythematous. Hearing normal.  Neck: Supple, thyroid normal. No bruits, nodes or JVD. Respiratory: Respiratory effort normal.  BS equal and clear bilateral without rales, rhonci, wheezing or stridor. Cardio: Heart sounds are normal with regular rate and rhythm and no murmurs, rubs or gallops. Peripheral pulses are normal and equal bilaterally without edema. No aortic or femoral bruits. Chest: symmetric with normal excursions and percussion. Breasts: Symmetric, without lumps, nipple discharge, retractions, or fibrocystic changes.  Abdomen: Flat, soft, with bowl sounds. Nontender, no guarding, rebound, hernias, masses, or organomegaly.  Lymphatics: Non tender without lymphadenopathy.  Genitourinary:  Musculoskeletal: Full ROM all peripheral extremities, joint stability, 5/5 strength, and normal gait. Skin: Warm and dry without rashes, lesions, cyanosis, clubbing or  ecchymosis.  Neuro: Cranial  nerves intact, reflexes equal bilaterally. Normal muscle tone, no cerebellar symptoms. Sensation intact.  Pysch: Awake and oriented X 3, normal affect, Insight and Judgment appropriate.   Assessment and Plan  1. Annual Screening Examination 2. Hypertension  3. Hyperlipidemia 4. 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 Vitamin D Deficiency  Continue prudent diet as discussed, weight control, BP monitoring, regular exercise, and medications. Discussed med's effects and SE's. Screening labs and tests as requested with regular follow-up as recommended.

## 2013-09-18 LAB — MICROALBUMIN / CREATININE URINE RATIO
CREATININE, URINE: 41.2 mg/dL
MICROALB UR: 13.67 mg/dL — AB (ref 0.00–1.89)
Microalb Creat Ratio: 331.8 mg/g — ABNORMAL HIGH (ref 0.0–30.0)

## 2013-09-18 LAB — URINALYSIS, MICROSCOPIC ONLY
Casts: NONE SEEN
Crystals: NONE SEEN
Squamous Epithelial / LPF: NONE SEEN

## 2013-09-18 LAB — BASIC METABOLIC PANEL WITH GFR
BUN: 17 mg/dL (ref 6–23)
CO2: 30 mEq/L (ref 19–32)
Calcium: 10.1 mg/dL (ref 8.4–10.5)
Chloride: 97 mEq/L (ref 96–112)
Creat: 0.95 mg/dL (ref 0.50–1.10)
GFR, EST AFRICAN AMERICAN: 65 mL/min
GFR, Est Non African American: 56 mL/min — ABNORMAL LOW
Glucose, Bld: 131 mg/dL — ABNORMAL HIGH (ref 70–99)
Potassium: 4 mEq/L (ref 3.5–5.3)
SODIUM: 139 meq/L (ref 135–145)

## 2013-09-18 LAB — HEPATIC FUNCTION PANEL
ALT: 15 U/L (ref 0–35)
AST: 23 U/L (ref 0–37)
Albumin: 4.1 g/dL (ref 3.5–5.2)
Alkaline Phosphatase: 64 U/L (ref 39–117)
Bilirubin, Direct: 0.1 mg/dL (ref 0.0–0.3)
Indirect Bilirubin: 0.3 mg/dL (ref 0.2–1.2)
Total Bilirubin: 0.4 mg/dL (ref 0.2–1.2)
Total Protein: 7.3 g/dL (ref 6.0–8.3)

## 2013-09-18 LAB — LIPID PANEL
CHOL/HDL RATIO: 3.2 ratio
CHOLESTEROL: 149 mg/dL (ref 0–200)
HDL: 47 mg/dL (ref >39)
LDL Cholesterol: 90 mg/dL (ref 0–99)
TRIGLYCERIDES: 58 mg/dL (ref <150)
VLDL: 12 mg/dL (ref 0–40)

## 2013-09-18 LAB — MAGNESIUM: Magnesium: 1.9 mg/dL (ref 1.5–2.5)

## 2013-09-18 LAB — TSH: TSH: 1.979 u[IU]/mL (ref 0.350–4.500)

## 2013-09-18 LAB — VITAMIN B12

## 2013-09-18 LAB — VITAMIN D 25 HYDROXY (VIT D DEFICIENCY, FRACTURES): Vit D, 25-Hydroxy: 98 ng/mL — ABNORMAL HIGH (ref 30–89)

## 2013-09-18 LAB — INSULIN, FASTING: Insulin fasting, serum: 31 u[IU]/mL — ABNORMAL HIGH (ref 3–28)

## 2013-09-23 ENCOUNTER — Other Ambulatory Visit: Payer: Commercial Managed Care - HMO

## 2013-09-23 DIAGNOSIS — N390 Urinary tract infection, site not specified: Secondary | ICD-10-CM

## 2013-09-24 ENCOUNTER — Other Ambulatory Visit: Payer: Self-pay | Admitting: Internal Medicine

## 2013-09-24 LAB — URINE CULTURE

## 2013-09-24 MED ORDER — CEPHALEXIN 500 MG PO CAPS: 500.0000 mg | ORAL_CAPSULE | Freq: Four times a day (QID) | ORAL | Status: AC

## 2013-09-25 ENCOUNTER — Telehealth: Payer: Self-pay | Admitting: *Deleted

## 2013-09-25 NOTE — Telephone Encounter (Signed)
Returned call to patient and daughter to discuss concerns about number of meds patient takes and also current UTI.  Per Dr Melford Aase, patient  may gradually be able to decrease her meds as she continues to lose weight.  He also advised that her UTI would not be caused by any med she is taking.  He also advised patient to increase fluid intake and take vitamin C 1000 mg 3 times a day with meals to help prevent UTI's.

## 2013-10-02 ENCOUNTER — Other Ambulatory Visit: Payer: Self-pay | Admitting: Emergency Medicine

## 2013-10-17 ENCOUNTER — Other Ambulatory Visit: Payer: Self-pay | Admitting: Emergency Medicine

## 2013-10-18 ENCOUNTER — Other Ambulatory Visit: Payer: Self-pay | Admitting: Emergency Medicine

## 2013-10-29 ENCOUNTER — Ambulatory Visit: Payer: Commercial Managed Care - HMO | Admitting: Internal Medicine

## 2013-10-29 ENCOUNTER — Ambulatory Visit (INDEPENDENT_AMBULATORY_CARE_PROVIDER_SITE_OTHER): Payer: Commercial Managed Care - HMO | Admitting: *Deleted

## 2013-10-29 ENCOUNTER — Other Ambulatory Visit: Payer: Self-pay | Admitting: *Deleted

## 2013-10-29 DIAGNOSIS — N3000 Acute cystitis without hematuria: Secondary | ICD-10-CM

## 2013-10-29 DIAGNOSIS — N3001 Acute cystitis with hematuria: Secondary | ICD-10-CM

## 2013-10-29 LAB — URINALYSIS, MICROSCOPIC ONLY
Casts: NONE SEEN
Crystals: NONE SEEN

## 2013-10-29 LAB — URINALYSIS, ROUTINE W REFLEX MICROSCOPIC
Bilirubin Urine: NEGATIVE
Glucose, UA: NEGATIVE mg/dL
Hgb urine dipstick: NEGATIVE
Ketones, ur: NEGATIVE mg/dL
NITRITE: POSITIVE — AB
Protein, ur: NEGATIVE mg/dL
SPECIFIC GRAVITY, URINE: 1.008 (ref 1.005–1.030)
Urobilinogen, UA: 0.2 mg/dL (ref 0.0–1.0)
pH: 7.5 (ref 5.0–8.0)

## 2013-10-29 MED ORDER — ALENDRONATE SODIUM 70 MG PO TABS: 70.0000 mg | ORAL_TABLET | ORAL | Status: DC

## 2013-10-29 NOTE — Progress Notes (Signed)
Patient ID: Kristine Burnett, female   DOB: 12-Sep-1929, 80 y.o.   MRN: [id number] Patient presents for 1 month recheck UA,C&S.  Patient states she has completed abx AD and denies any current symptoms.  Per [facility] orders a weight was checked while patient was here and patient was 173 lbs in 08/2013 and now 165 lbs today.

## 2013-11-01 ENCOUNTER — Other Ambulatory Visit: Payer: Self-pay | Admitting: Internal Medicine

## 2013-11-01 LAB — URINE CULTURE: Colony Count: >=100000

## 2013-11-01 MED ORDER — CIPROFLOXACIN HCL 500 MG PO TABS: 500.0000 mg | ORAL_TABLET | Freq: Two times a day (BID) | ORAL | Status: AC

## 2013-11-20 ENCOUNTER — Telehealth: Payer: Self-pay | Admitting: Internal Medicine

## 2013-11-20 NOTE — Telephone Encounter (Signed)
PATIENT CALLED TO GET A REFERRAL FOR HER 6 WK FOLLOW UP WITH UROLOGIST.  SCHEDULED FOR 12-04-13.  PLEASE ADVISE PATIENT WHEN COMPLETED.  Thank you, Katrina Judeth Horn [facility] [phone] Fax [phone]

## 2013-11-21 ENCOUNTER — Emergency Department (HOSPITAL_BASED_OUTPATIENT_CLINIC_OR_DEPARTMENT_OTHER)
Admission: EM | Admit: 2013-11-21 | Discharge: 2013-11-21 | Disposition: A | Discharge status: Home / Self Care | Payer: Medicare PPO | Attending: Emergency Medicine | Admitting: Emergency Medicine

## 2013-11-21 ENCOUNTER — Emergency Department (HOSPITAL_BASED_OUTPATIENT_CLINIC_OR_DEPARTMENT_OTHER): Payer: Medicare PPO

## 2013-11-21 ENCOUNTER — Encounter (HOSPITAL_BASED_OUTPATIENT_CLINIC_OR_DEPARTMENT_OTHER): Payer: Self-pay | Admitting: Emergency Medicine

## 2013-11-21 DIAGNOSIS — Z79899 Other long term (current) drug therapy: Secondary | ICD-10-CM | POA: Diagnosis not present

## 2013-11-21 DIAGNOSIS — Y9289 Other specified places as the place of occurrence of the external cause: Secondary | ICD-10-CM | POA: Insufficient documentation

## 2013-11-21 DIAGNOSIS — Y9389 Activity, other specified: Secondary | ICD-10-CM | POA: Diagnosis not present

## 2013-11-21 DIAGNOSIS — S99919A Unspecified injury of unspecified ankle, initial encounter: Secondary | ICD-10-CM

## 2013-11-21 DIAGNOSIS — W2203XA Walked into furniture, initial encounter: Secondary | ICD-10-CM | POA: Diagnosis not present

## 2013-11-21 DIAGNOSIS — E119 Type 2 diabetes mellitus without complications: Secondary | ICD-10-CM | POA: Insufficient documentation

## 2013-11-21 DIAGNOSIS — E559 Vitamin D deficiency, unspecified: Secondary | ICD-10-CM | POA: Diagnosis not present

## 2013-11-21 DIAGNOSIS — E785 Hyperlipidemia, unspecified: Secondary | ICD-10-CM | POA: Insufficient documentation

## 2013-11-21 DIAGNOSIS — Z7982 Long term (current) use of aspirin: Secondary | ICD-10-CM | POA: Diagnosis not present

## 2013-11-21 DIAGNOSIS — Z880 Allergy status to penicillin: Secondary | ICD-10-CM | POA: Insufficient documentation

## 2013-11-21 DIAGNOSIS — Z794 Long term (current) use of insulin: Secondary | ICD-10-CM | POA: Diagnosis not present

## 2013-11-21 DIAGNOSIS — S9030XA Contusion of unspecified foot, initial encounter: Secondary | ICD-10-CM | POA: Insufficient documentation

## 2013-11-21 DIAGNOSIS — S92919A Unspecified fracture of unspecified toe(s), initial encounter for closed fracture: Secondary | ICD-10-CM | POA: Diagnosis not present

## 2013-11-21 DIAGNOSIS — I10 Essential (primary) hypertension: Secondary | ICD-10-CM | POA: Diagnosis not present

## 2013-11-21 DIAGNOSIS — J45909 Unspecified asthma, uncomplicated: Secondary | ICD-10-CM | POA: Insufficient documentation

## 2013-11-21 DIAGNOSIS — S8990XA Unspecified injury of unspecified lower leg, initial encounter: Secondary | ICD-10-CM | POA: Insufficient documentation

## 2013-11-21 DIAGNOSIS — Z8739 Personal history of other diseases of the musculoskeletal system and connective tissue: Secondary | ICD-10-CM | POA: Diagnosis not present

## 2013-11-21 DIAGNOSIS — S92911A Unspecified fracture of right toe(s), initial encounter for closed fracture: Secondary | ICD-10-CM

## 2013-11-21 DIAGNOSIS — S99929A Unspecified injury of unspecified foot, initial encounter: Secondary | ICD-10-CM

## 2013-11-21 NOTE — Discharge Instructions (Signed)
Cast or Splint Care Casts and splints support injured limbs and keep bones from moving while they heal.  HOME CARE  Keep the cast or splint uncovered during the drying period.  A plaster cast can take 24 to 48 hours to dry.  A fiberglass cast will dry in less than 1 hour.  Do not rest the cast on anything harder than a pillow for 24 hours.  Do not put weight on your injured limb. Do not put pressure on the cast. Wait for your doctor's approval.  Keep the cast or splint dry.  Cover the cast or splint with a plastic bag during baths or wet weather.  If you have a cast over your chest and belly (trunk), take sponge baths until the cast is taken off.  If your cast gets wet, dry it with a towel or blow dryer. Use the cool setting on the blow dryer.  Keep your cast or splint clean. Wash a dirty cast with a damp cloth.  Do not put any objects under your cast or splint.  Do not scratch the skin under the cast with an object. If itching is a problem, use a blow dryer on a cool setting over the itchy area.  Do not trim or cut your cast.  Do not take out the padding from inside your cast.  Exercise your joints near the cast as told by your doctor.  Raise (elevate) your injured limb on 1 or 2 pillows for the first 1 to 3 days. GET HELP IF:  Your cast or splint cracks.  Your cast or splint is too tight or too loose.  You itch badly under the cast.  Your cast gets wet or has a soft spot.  You have a bad smell coming from the cast.  You get an object stuck under the cast.  Your skin around the cast becomes red or sore.  You have new or more pain after the cast is put on. GET HELP RIGHT AWAY IF:  You have fluid leaking through the cast.  You cannot move your fingers or toes.  Your fingers or toes turn blue or white or are cool, painful, or puffy (swollen).  You have tingling or lose feeling (numbness) around the injured area.  You have bad pain or pressure under the  cast.  You have trouble breathing or have shortness of breath.  You have chest pain. Document Released: 07/07/2010 Document Revised: 11/07/2012 Document Reviewed: 09/13/2012 [facility] Patient Information 2015 [place], [place]. This information is not intended to replace advice given to you by your health care provider. Make sure you discuss any questions you have with your health care provider.  Buddy Taping of Toes We have taped your toes together to keep them from moving. This is called "buddy taping" since we used a part of your own body to keep the injured part still. We placed soft padding between your toes to keep them from rubbing against each other. Buddy taping will help with healing and to reduce pain. Keep your toes buddy taped together for as long as directed by your caregiver. HOME CARE INSTRUCTIONS   Raise your injured area above the level of your heart while sitting or lying down. Prop it up with pillows.  An ice pack used every twenty minutes, while awake, for the first one to two days may be helpful. Put ice in a plastic bag and put a towel between the bag and your skin.  Watch for signs that the  taping is too tight. These signs may be:  Numbness of your taped toes.  Coolness of your taped toes.  Color change in the area beyond the tape.  Increased pain.  If you have any of these signs, loosen or rewrap the tape. If you need to loosen or rewrap the buddy tape, make sure you use the padding again. SEEK IMMEDIATE MEDICAL CARE IF:   You have worse pain, swelling, inflammation (soreness), drainage or bleeding after you rewrap the tape.  Any new problems occur. MAKE SURE YOU:   Understand these instructions.  Will watch your condition.  Will get help right away if you are not doing well or get worse. Document Released: 12/10/2003 Document Revised: 05/30/2011 Document Reviewed: 03/04/2008 [facility] Patient Information 2015 [place], [place]. This information is not  intended to replace advice given to you by your health care provider. Make sure you discuss any questions you have with your health care provider.

## 2013-11-21 NOTE — ED Provider Notes (Signed)
Medical screening examination/treatment/procedure(s) were performed by non-physician practitioner and as supervising physician I was immediately available for consultation/collaboration.    Delora Fuel, MD 02/72/53 [id number]

## 2013-11-21 NOTE — ED Notes (Signed)
Patient states that she hurt her foot about 1 week ago, feels like her toe may be broken. The patient reports that her right big toe is swollen and painful

## 2013-11-21 NOTE — ED Provider Notes (Signed)
CSN: [id number]     Arrival date & time 11/21/13  1918 History   First MD Initiated Contact with Patient 11/21/13 1958     Chief Complaint  Patient presents with  . Foot Pain     (Consider location/radiation/quality/duration/timing/severity/associated sxs/prior Treatment) Patient is a 80 y.o. female presenting with lower extremity pain. The history is provided by the patient. No language interpreter was used.  Foot Pain This is a new problem. The current episode started 1 to 4 weeks ago. The problem occurs constantly. The problem has been unchanged. Associated symptoms include joint swelling. Nothing aggravates the symptoms. She has tried nothing for the symptoms. The treatment provided no relief.  p[t hit toe on a piece of furniture.   Pt complains of swelling and pain  Past Medical History  Diagnosis Date  . Hypertension   . Hyperlipidemia   . Diabetes mellitus without complication   . Vitamin D deficiency   . Asthma   . DDD (degenerative disc disease)   . Spinal stenosis   . Type II or unspecified type diabetes mellitus without mention of complication, not stated as uncontrolled 03/22/2013   Past Surgical History  Procedure Laterality Date  . Hernia repair    . Cyst excision  back   Family History  Problem Relation Age of Onset  . Heart attack Mother   . Cancer Mother     breast  . Hypertension Mother   . Heart attack Father   . Stroke Sister   . Cancer Sister     breast  . Heart attack Brother   . Heart attack Brother    History  Substance Use Topics  . Smoking status: Never Smoker   . Smokeless tobacco: Not on file  . Alcohol Use: No   OB History   Grav Para Term Preterm Abortions TAB SAB Ect Mult Living                 Review of Systems  Musculoskeletal: Positive for joint swelling.  All other systems reviewed and are negative.     Allergies  Ace inhibitors; Crestor; Grapefruit extract; Minoxidil; and Penicillins  Home Medications   Prior to  Admission medications   Medication Sig Start Date End Date Taking? Authorizing Provider  alendronate (FOSAMAX) 70 MG tablet Take 1 tablet (70 mg total) by mouth once a week. Take with a full glass of water on an empty stomach. 10/29/13   Unk Pinto, MD  aspirin 325 MG tablet Take 325 mg by mouth daily.    Historical Provider, MD  atenolol (TENORMIN) 100 MG tablet Take 100 mg by mouth daily.    Historical Provider, MD  baclofen (LIORESAL) 10 MG tablet TAKE 1/2 TO 1 TABLET BY MOUTH THREE TIMES DAILY AS NEEDED FOR MUSCLE SPASM    Melissa R Smith, PA-C  baclofen (LIORESAL) 10 MG tablet TAKE 1/2 TO 1 TABLET BY MOUTH THREE TIMES DAILY AS NEEDED FOR MUSCLE SPASM 10/18/13   Vicie Mutters, PA-C  Cholecalciferol (VITAMIN D-3) 1000 UNITS CAPS Take 4,000 Units by mouth daily.    Historical Provider, MD  Cyanocobalamin (VITAMIN B-12) 2500 MCG SUBL Place 1 tablet under the tongue daily.    Historical Provider, MD  diltiazem (CARDIZEM) 120 MG tablet TAKE 1 TABLET BY MOUTH TWICE DAILY FOR HIGH BLOOD PRESSURE 08/03/13   Vicie Mutters, PA-C  enalapril (VASOTEC) 20 MG tablet TAKE 1 TABLET BY MOUTH TWICE DAILY    Unk Pinto, MD  Flaxseed, Linseed, (FLAXSEED OIL) 1000 MG  CAPS Take 1,000 mg by mouth 4 (four) times daily.    Historical Provider, MD  hydrochlorothiazide (HYDRODIURIL) 25 MG tablet Take 12.5 mg by mouth daily.    Historical Provider, MD  Lancets (FREESTYLE) lancets Test glucose 1 time daily 08/16/13   Unk Pinto, MD  Magnesium Oxide (MAG-OX PO) Take 500 mg by mouth 2 (two) times daily.    Historical Provider, MD  metFORMIN (GLUCOPHAGE) 500 MG tablet Take 1 tablet (500 mg total) by mouth 2 (two) times daily with a meal. 06/27/13 06/27/14  Melissa R Smith, PA-C  Multiple Vitamins-Minerals (MULTIVITAMIN & MINERAL PO) Take by mouth daily.    Historical Provider, MD  oxybutynin (DITROPAN) 5 MG tablet TAKE 1 TABLET BY MOUTH TWICE DAILY FOR BLADDER 06/27/13   Unk Pinto, MD  simvastatin (ZOCOR) 40 MG  tablet TAKE 1 TABLET BY MOUTH EVERY NIGHT AT BEDTIME FOR CHOLESTEROL 08/03/13   Vicie Mutters, PA-C   BP 188/98  Pulse 74  Temp(Src) 98.6 F (37 C) (Oral)  Resp 20  SpO2 98% Physical Exam  Constitutional: She is oriented to person, place, and time. She appears well-developed and well-nourished.  Musculoskeletal: She exhibits tenderness.  Swollen right 1st toe, bruising to the bottom of her foot  Neurological: She is alert and oriented to person, place, and time. She has normal reflexes.  Skin: Skin is warm.  Psychiatric: She has a normal mood and affect.    ED Course  Procedures (including critical care time) Labs Review Labs Reviewed - No data to display  Imaging Review Dg Foot Complete Right  11/21/2013   CLINICAL DATA:  FOOT PAIN  EXAM: RIGHT FOOT COMPLETE - 3+ VIEW  COMPARISON:  None.  FINDINGS: Oblique mildly impacted fracture, proximal phalanx great toe. No definite involvement of the articular surfaces. There is mild lateral angulation of the distal fracture fragment. Old fracture deformity of the third metatarsal. Diffuse osteopenia. No dislocation. Regional soft tissues unremarkable.  IMPRESSION: 1. Mildly impacted and angulated oblique fracture, proximal phalanx right great toe.   Electronically Signed   By: Arne Cleveland M.D.   On: 11/21/2013 19:59     EKG Interpretation None      MDM   Final diagnoses:  Fracture, toe, right, closed, initial encounter    Post op shoe Buddy tape crutches    Fransico Meadow, PA-C 11/21/13 2020

## 2013-11-21 NOTE — Telephone Encounter (Signed)
Approved to see Dr.Eskridge. Patient aware

## 2013-11-21 NOTE — ED Notes (Signed)
RN Rosana Hoes explained discharge instructions to both the Pt. And the Pt. Daughter.  Pt. Understands and Pt. Daughter understands.

## 2013-11-21 NOTE — ED Notes (Signed)
Pt. Was mistakenly charged x 2 for post Op shoe and was only given one post op shoe.

## 2013-11-27 ENCOUNTER — Encounter: Payer: Self-pay | Admitting: Family Medicine

## 2013-11-27 ENCOUNTER — Ambulatory Visit (INDEPENDENT_AMBULATORY_CARE_PROVIDER_SITE_OTHER): Payer: Medicare PPO | Admitting: Family Medicine

## 2013-11-27 VITALS — BP 167/77 | HR 53 | Ht 67.0 in | Wt 160.0 lb

## 2013-11-27 DIAGNOSIS — S92919A Unspecified fracture of unspecified toe(s), initial encounter for closed fracture: Secondary | ICD-10-CM

## 2013-11-27 DIAGNOSIS — S92911A Unspecified fracture of right toe(s), initial encounter for closed fracture: Secondary | ICD-10-CM

## 2013-11-27 NOTE — Patient Instructions (Signed)
You have a proximal 1st toe fracture. These usually heal really well over 4-6 weeks. Wear the shoe and buddy tape 1st and 2nd toes together for 2 weeks. Then only buddy tape the toes for 2 weeks but wear a comfortable supportive shoe. 4 weeks from now you can stop buddy taping. Icing, tylenol only if needed for pain. Ok to put weight on this also in the shoe. You don't need repeat x-rays for this type of fracture. Follow up with me as needed.

## 2013-11-28 ENCOUNTER — Encounter: Payer: Self-pay | Admitting: Family Medicine

## 2013-11-28 DIAGNOSIS — S92911A Unspecified fracture of right toe(s), initial encounter for closed fracture: Secondary | ICD-10-CM | POA: Insufficient documentation

## 2013-11-28 NOTE — Assessment & Plan Note (Signed)
Right 1st proximal phalanx fracture - reassured.  Already clinically much improved at this point.  Continue with buddy taping 4 more weeks, postop shoe 2 more weeks.  Icing, tylenol only if needed.  F/u in 4 weeks only if having problems.

## 2013-11-28 NOTE — Progress Notes (Signed)
Patient ID: ANYELI HOCKENBURY, female   DOB: Apr 05, 1929, 80 y.o.   MRN: [id number]  PCP: Alesia Richards, MD  Subjective:   HPI: Patient is a 80 y.o. female here for right foot injury.  Patient reports she accidentally kicked and tripped over a chair about 2-3 weeks ago. Immediate pain, swelling of 1st toe up medial foot. No prior injuries. Went to ED - radiographs showed proximal phalanx fracture of 1st digit. Placed in postop shoe and buddy taping. Currently has no pain. Not taking any medicines or icing this. No other complaints.  Past Medical History  Diagnosis Date  . Hypertension   . Hyperlipidemia   . Diabetes mellitus without complication   . Vitamin D deficiency   . Asthma   . DDD (degenerative disc disease)   . Spinal stenosis   . Type II or unspecified type diabetes mellitus without mention of complication, not stated as uncontrolled 03/22/2013    Current Outpatient Prescriptions on File Prior to Visit  Medication Sig Dispense Refill  . alendronate (FOSAMAX) 70 MG tablet Take 1 tablet (70 mg total) by mouth once a week. Take with a full glass of water on an empty stomach.  4 tablet  2  . aspirin 325 MG tablet Take 325 mg by mouth daily.      Marland Kitchen atenolol (TENORMIN) 100 MG tablet Take 100 mg by mouth daily.      . baclofen (LIORESAL) 10 MG tablet TAKE 1/2 TO 1 TABLET BY MOUTH THREE TIMES DAILY AS NEEDED FOR MUSCLE SPASM  90 tablet  0  . baclofen (LIORESAL) 10 MG tablet TAKE 1/2 TO 1 TABLET BY MOUTH THREE TIMES DAILY AS NEEDED FOR MUSCLE SPASM  90 tablet  0  . Cholecalciferol (VITAMIN D-3) 1000 UNITS CAPS Take 4,000 Units by mouth daily.      . Cyanocobalamin (VITAMIN B-12) 2500 MCG SUBL Place 1 tablet under the tongue daily.      Marland Kitchen diltiazem (CARDIZEM) 120 MG tablet TAKE 1 TABLET BY MOUTH TWICE DAILY FOR HIGH BLOOD PRESSURE  180 tablet  98  . enalapril (VASOTEC) 20 MG tablet TAKE 1 TABLET BY MOUTH TWICE DAILY  60 tablet  3  . Flaxseed, Linseed, (FLAXSEED OIL) 1000 MG  CAPS Take 1,000 mg by mouth 4 (four) times daily.      . hydrochlorothiazide (HYDRODIURIL) 25 MG tablet Take 12.5 mg by mouth daily.      . Lancets (FREESTYLE) lancets Test glucose 1 time daily  100 each  1  . Magnesium Oxide (MAG-OX PO) Take 500 mg by mouth 2 (two) times daily.      . metFORMIN (GLUCOPHAGE) 500 MG tablet Take 1 tablet (500 mg total) by mouth 2 (two) times daily with a meal.  60 tablet  3  . Multiple Vitamins-Minerals (MULTIVITAMIN & MINERAL PO) Take by mouth daily.      Marland Kitchen oxybutynin (DITROPAN) 5 MG tablet TAKE 1 TABLET BY MOUTH TWICE DAILY FOR BLADDER  60 tablet  6  . simvastatin (ZOCOR) 40 MG tablet TAKE 1 TABLET BY MOUTH EVERY NIGHT AT BEDTIME FOR CHOLESTEROL  90 tablet  98   No current facility-administered medications on file prior to visit.    Past Surgical History  Procedure Laterality Date  . Hernia repair    . Cyst excision  back    Allergies  Allergen Reactions  . Ace Inhibitors Cough  . Crestor [Rosuvastatin]   . Grapefruit Extract   . Minoxidil   . Penicillins  History   Social History  . Marital Status: Widowed    Spouse Name: N/A    Number of Children: N/A  . Years of Education: N/A   Occupational History  . Not on file.   Social History Main Topics  . Smoking status: Never Smoker   . Smokeless tobacco: Not on file  . Alcohol Use: No  . Drug Use: No  . Sexual Activity: Not on file   Other Topics Concern  . Not on file   Social History Narrative  . No narrative on file    Family History  Problem Relation Age of Onset  . Heart attack Mother   . Cancer Mother     breast  . Hypertension Mother   . Heart attack Father   . Stroke Sister   . Cancer Sister     breast  . Heart attack Brother   . Heart attack Brother     BP 167/77  Pulse 53  Ht 5\' 7"  (1.702 m)  Wt 160 lb (72.576 kg)  BMI 25.05 kg/m2  Review of Systems: See HPI above.    Objective:  Physical Exam:  Gen: NAD  Right foot/ankle: Mild lateral  deviation of all digits including 1st digit.  No malrotation. Hallux rigidus bilaterally.  Able to flex and extend at 1st IP and MTP joint. No TTP currently including 1st digit. Negative ant drawer and talar tilt.   Negative metatarsal squeeze. NV intact distally.    Assessment & Plan:  1. Right 1st proximal phalanx fracture - reassured.  Already clinically much improved at this point.  Continue with buddy taping 4 more weeks, postop shoe 2 more weeks.  Icing, tylenol only if needed.  F/u in 4 weeks only if having problems.

## 2013-12-02 ENCOUNTER — Other Ambulatory Visit: Payer: Self-pay | Admitting: Emergency Medicine

## 2013-12-02 ENCOUNTER — Other Ambulatory Visit: Payer: Self-pay | Admitting: Internal Medicine

## 2013-12-04 ENCOUNTER — Telehealth: Payer: Self-pay

## 2013-12-04 NOTE — Telephone Encounter (Signed)
Received refill request for Baclofen 10 mg  #90, 1/2 to 1 tablet three times daily for muscle spasms,  from [facility] Dr Lady Gary, per Kelby Aline, PA called patient to advise that per [facility] she needed to cut back and only take 1/2 - 1 twice daily only if needed, due to age and side effects, patient advised that she did not request the RX , the pharmacy staff stated to her that there were no refills on this medication and sent it, she stated not to fill it because she did not need it and did not request it, Melissa advised.

## 2013-12-05 ENCOUNTER — Ambulatory Visit: Payer: Self-pay

## 2013-12-25 ENCOUNTER — Encounter: Payer: Self-pay | Admitting: Physician Assistant

## 2013-12-25 ENCOUNTER — Ambulatory Visit (INDEPENDENT_AMBULATORY_CARE_PROVIDER_SITE_OTHER): Payer: Commercial Managed Care - HMO | Admitting: Physician Assistant

## 2013-12-25 VITALS — BP 132/80 | HR 60 | Temp 98.4°F | Resp 16 | Ht 66.0 in | Wt 163.0 lb

## 2013-12-25 DIAGNOSIS — N182 Chronic kidney disease, stage 2 (mild): Secondary | ICD-10-CM

## 2013-12-25 DIAGNOSIS — E559 Vitamin D deficiency, unspecified: Secondary | ICD-10-CM

## 2013-12-25 DIAGNOSIS — E1122 Type 2 diabetes mellitus with diabetic chronic kidney disease: Secondary | ICD-10-CM | POA: Insufficient documentation

## 2013-12-25 DIAGNOSIS — N189 Chronic kidney disease, unspecified: Secondary | ICD-10-CM

## 2013-12-25 DIAGNOSIS — I10 Essential (primary) hypertension: Secondary | ICD-10-CM

## 2013-12-25 DIAGNOSIS — E782 Mixed hyperlipidemia: Secondary | ICD-10-CM

## 2013-12-25 DIAGNOSIS — Z79899 Other long term (current) drug therapy: Secondary | ICD-10-CM

## 2013-12-25 NOTE — Patient Instructions (Signed)

## 2013-12-25 NOTE — Progress Notes (Signed)
Assessment and Plan:  Hypertension: Continue medication, monitor blood pressure at home. Continue DASH diet. Cholesterol: Continue diet and exercise. Check cholesterol.  Diabetes-Continue diet and exercise. Check A1C Vitamin D Def- check level and continue medications.  Osteoporosis- on fosamax- check DEXA  Continue diet and meds as discussed. Further disposition pending results of labs. Discussed med's effects and SE's.    HPI 80 y.o. female  presents for 3 month follow up with hypertension, hyperlipidemia, diabetes and vitamin D. Her blood pressure has been controlled at home, today their BP is BP: 132/80 mmHg She does not workout, walks with walker. She denies chest pain, shortness of breath, dizziness.  She is on cholesterol medication and denies myalgias. Her cholesterol is at goal. The cholesterol last visit was:   Lab Results  Component Value Date   CHOL 149 09/17/2013   HDL 47 09/17/2013   LDLCALC 90 09/17/2013   TRIG 58 09/17/2013   CHOLHDL 3.2 09/17/2013   She has been working on diet and exercise for Diabetes with CKD stage II, on MF and ARB, and denies paresthesia of the feet, polydipsia and polyuria. Last A1C in the office was:  Lab Results  Component Value Date   HGBA1C 6.1* 09/17/2013   Patient is on Vitamin D supplement. Lab Results  Component Value Date   VD25OH 28* 09/17/2013     She has been on fosamax for osteoporosis since.08/2012 after being off of it for a year. Last DEXA was 03/2011, she is due DEXA.  She has been taken baclofen in the past for her legs but this was discontinued last visit, she has been taking aleve for her ain, she walks with a walker.   Current Medications:  Current Outpatient Prescriptions on File Prior to Visit  Medication Sig Dispense Refill  . alendronate (FOSAMAX) 70 MG tablet Take 1 tablet (70 mg total) by mouth once a week. Take with a full glass of water on an empty stomach.  4 tablet  2  . aspirin 325 MG tablet Take 325 mg by mouth  daily.      Marland Kitchen atenolol (TENORMIN) 100 MG tablet TAKE 1 TABLET BY MOUTH DAILY FOR BLOOD PRESSURE  90 tablet  1  . baclofen (LIORESAL) 10 MG tablet TAKE 1/2 TO 1 TABLET BY MOUTH THREE TIMES DAILY AS NEEDED FOR MUSCLE SPASM  90 tablet  0  . baclofen (LIORESAL) 10 MG tablet TAKE 1/2 TO 1 TABLET BY MOUTH THREE TIMES DAILY AS NEEDED FOR MUSCLE SPASM  90 tablet  0  . Cholecalciferol (VITAMIN D-3) 1000 UNITS CAPS Take 4,000 Units by mouth daily.      . Cyanocobalamin (VITAMIN B-12) 2500 MCG SUBL Place 1 tablet under the tongue daily.      Marland Kitchen diltiazem (CARDIZEM) 120 MG tablet TAKE 1 TABLET BY MOUTH TWICE DAILY FOR HIGH BLOOD PRESSURE  180 tablet  98  . enalapril (VASOTEC) 20 MG tablet TAKE 1 TABLET BY MOUTH TWICE DAILY  60 tablet  3  . Flaxseed, Linseed, (FLAXSEED OIL) 1000 MG CAPS Take 1,000 mg by mouth 4 (four) times daily.      . hydrochlorothiazide (HYDRODIURIL) 25 MG tablet TAKE 1 TABLET BY MOUTH EVERY MORNING  90 tablet  98  . hydrochlorothiazide (HYDRODIURIL) 25 MG tablet TAKE 1 TABLET BY MOUTH EVERY MORNING  90 tablet  98  . Lancets (FREESTYLE) lancets Test glucose 1 time daily  100 each  1  . Magnesium Oxide (MAG-OX PO) Take 500 mg by mouth 2 (two)  times daily.      . metFORMIN (GLUCOPHAGE) 500 MG tablet Take 1 tablet (500 mg total) by mouth 2 (two) times daily with a meal.  60 tablet  3  . Multiple Vitamins-Minerals (MULTIVITAMIN & MINERAL PO) Take by mouth daily.      Marland Kitchen oxybutynin (DITROPAN) 5 MG tablet TAKE 1 TABLET BY MOUTH TWICE DAILY FOR BLADDER  60 tablet  6  . simvastatin (ZOCOR) 40 MG tablet TAKE 1 TABLET BY MOUTH EVERY NIGHT AT BEDTIME FOR CHOLESTEROL  90 tablet  98   No current facility-administered medications on file prior to visit.   Medical History:  Past Medical History  Diagnosis Date  . Hypertension   . Hyperlipidemia   . Diabetes mellitus without complication   . Vitamin D deficiency   . Asthma   . DDD (degenerative disc disease)   . Spinal stenosis   . Type II or  unspecified type diabetes mellitus without mention of complication, not stated as uncontrolled 03/22/2013   Allergies:  Allergies  Allergen Reactions  . Ace Inhibitors Cough  . Crestor [Rosuvastatin]   . Grapefruit Extract   . Minoxidil   . Penicillins      Review of Systems: [X]  = complains of  [ ]  = denies  General: Fatigue [ ]  Fever [ ]  Chills [ ]  Weakness [ ]   Insomnia [ ]  Eyes: Redness [ ]  Blurred vision [ ]  Diplopia [ ]   ENT: Congestion [ ]  Sinus Pain [ ]  Post Nasal Drip [ ]  Sore Throat [ ]  Earache [ ]   Cardiac: Chest pain/pressure [ ]  SOB [ ]  Orthopnea [ ]   Palpitations [ ]   Paroxysmal nocturnal dyspnea[ ]  Claudication [ ]  Edema [ ]   Pulmonary: Cough [ ]  Wheezing[ ]   SOB [ ]   Snoring [ ]   GI: Nausea [ ]  Vomiting[ ]  Dysphagia[ ]  Heartburn[ ]  Abdominal pain [ ]  Constipation [ ] ; Diarrhea [ ] ; BRBPR [ ]  Melena[ ]  GU: Hematuria[ ]  Dysuria [ ]  Nocturia[ ]  Urgency [ ]   Hesitancy [ ]  Discharge [ ]  Neuro: Headaches[ ]  Vertigo[ ]  Paresthesias[ ]  Spasm [ ]  Speech changes [ ]  Incoordination [ ]   Ortho: Arthritis [ ]  Joint pain [ ]  Muscle pain [ ]  Joint swelling [ ]  Back Pain [ ]  Skin:  Rash [ ]   Pruritis [ ]  Change in skin lesion [ ]   Psych: Depression[ ]  Anxiety[ ]  Confusion [ ]  Memory loss [ ]   Heme/Lypmh: Bleeding [ ]  Bruising [ ]  Enlarged lymph nodes [ ]   Endocrine: Visual blurring [ ]  Paresthesia [ ]  Polyuria [ ]  Polydypsea [ ]    Heat/cold intolerance [ ]  Hypoglycemia [ ]   Family history- Review and unchanged Social history- Review and unchanged Physical Exam: BP 132/80  Pulse 60  Temp(Src) 98.4 F (36.9 C)  Resp 16  Ht 5\' 6"  (1.676 m)  Wt 163 lb (73.936 kg)  BMI 26.32 kg/m2 Wt Readings from Last 3 Encounters:  12/25/13 163 lb (73.936 kg)  11/27/13 160 lb (72.576 kg)  09/17/13 173 lb 9.6 oz (78.744 kg)   General Appearance: Well nourished, in no apparent distress. Eyes: PERRLA, EOMs, conjunctiva no swelling or erythema Sinuses: No Frontal/maxillary tenderness ENT/Mouth:  Ext aud canals clear, TMs without erythema, bulging. No erythema, swelling, or exudate on post pharynx.  Tonsils not swollen or erythematous. Hearing decreased Neck: Supple, thyroid normal.  Respiratory: Respiratory effort normal, BS equal bilaterally without rales, rhonchi, wheezing or stridor.  Cardio: RRR with no MRGs. Brisk peripheral  pulses without edema.  Abdomen: Soft, + BS.  Non tender, no guarding, rebound, hernias, masses. Lymphatics: Non tender without lymphadenopathy.  Musculoskeletal: Full ROM, 4/5 strength, gait antalgic with walker.  Skin: Warm, dry without rashes, lesions, ecchymosis.  Neuro: Cranial nerves intact. No cerebellar symptoms. Sensation intact.  Psych: Awake and oriented X 3, normal affect, Insight and Judgment appropriate.    Vicie Mutters 10:53 AM

## 2013-12-26 LAB — CBC WITH DIFFERENTIAL/PLATELET
Basophils Absolute: 0 10*3/uL (ref 0.0–0.1)
Basophils Relative: 0 % (ref 0–1)
EOS ABS: 0.2 10*3/uL (ref 0.0–0.7)
Eosinophils Relative: 5 % (ref 0–5)
HCT: 39.5 % (ref 36.0–46.0)
HEMOGLOBIN: 13.1 g/dL (ref 12.0–15.0)
LYMPHS ABS: 2.3 10*3/uL (ref 0.7–4.0)
LYMPHS PCT: 47 % — AB (ref 12–46)
MCH: 29.9 pg (ref 26.0–34.0)
MCHC: 33.2 g/dL (ref 30.0–36.0)
MCV: 90.2 fL (ref 78.0–100.0)
MONOS PCT: 9 % (ref 3–12)
Monocytes Absolute: 0.4 10*3/uL (ref 0.1–1.0)
NEUTROS PCT: 39 % — AB (ref 43–77)
Neutro Abs: 1.9 10*3/uL (ref 1.7–7.7)
PLATELETS: 235 10*3/uL (ref 150–400)
RBC: 4.38 MIL/uL (ref 3.87–5.11)
RDW: 15.3 % (ref 11.5–15.5)
WBC: 4.8 10*3/uL (ref 4.0–10.5)

## 2013-12-26 LAB — LIPID PANEL
Cholesterol: 147 mg/dL (ref 0–200)
HDL: 56 mg/dL (ref >39)
LDL CALC: 79 mg/dL (ref 0–99)
TRIGLYCERIDES: 59 mg/dL (ref <150)
Total CHOL/HDL Ratio: 2.6 Ratio
VLDL: 12 mg/dL (ref 0–40)

## 2013-12-26 LAB — BASIC METABOLIC PANEL WITH GFR
BUN: 18 mg/dL (ref 6–23)
CO2: 29 mEq/L (ref 19–32)
Calcium: 9.8 mg/dL (ref 8.4–10.5)
Chloride: 99 mEq/L (ref 96–112)
Creat: 0.9 mg/dL (ref 0.50–1.10)
GFR, EST AFRICAN AMERICAN: 69 mL/min
GFR, Est Non African American: 60 mL/min
GLUCOSE: 92 mg/dL (ref 70–99)
POTASSIUM: 4.2 meq/L (ref 3.5–5.3)
Sodium: 138 mEq/L (ref 135–145)

## 2013-12-26 LAB — HEPATIC FUNCTION PANEL
ALK PHOS: 71 U/L (ref 39–117)
ALT: 14 U/L (ref 0–35)
AST: 22 U/L (ref 0–37)
Albumin: 4 g/dL (ref 3.5–5.2)
BILIRUBIN INDIRECT: 0.2 mg/dL (ref 0.2–1.2)
Bilirubin, Direct: 0.1 mg/dL (ref 0.0–0.3)
TOTAL PROTEIN: 7.6 g/dL (ref 6.0–8.3)
Total Bilirubin: 0.3 mg/dL (ref 0.2–1.2)

## 2013-12-26 LAB — HEMOGLOBIN A1C
HEMOGLOBIN A1C: 6.1 % — AB (ref <5.7)
MEAN PLASMA GLUCOSE: 128 mg/dL — AB (ref <117)

## 2013-12-26 LAB — TSH: TSH: 2.989 u[IU]/mL (ref 0.350–4.500)

## 2013-12-26 LAB — MAGNESIUM: Magnesium: 2 mg/dL (ref 1.5–2.5)

## 2013-12-26 LAB — VITAMIN D 25 HYDROXY (VIT D DEFICIENCY, FRACTURES): VIT D 25 HYDROXY: 103 ng/mL — AB (ref 30–89)

## 2014-01-28 ENCOUNTER — Other Ambulatory Visit: Payer: Self-pay | Admitting: *Deleted

## 2014-01-28 ENCOUNTER — Other Ambulatory Visit: Payer: Self-pay | Admitting: Internal Medicine

## 2014-01-28 MED ORDER — OXYBUTYNIN CHLORIDE 5 MG PO TABS: ORAL_TABLET | ORAL | Status: DC

## 2014-01-28 MED ORDER — ENALAPRIL MALEATE 20 MG PO TABS: ORAL_TABLET | ORAL | Status: DC

## 2014-02-26 ENCOUNTER — Emergency Department (HOSPITAL_BASED_OUTPATIENT_CLINIC_OR_DEPARTMENT_OTHER): Payer: Medicare HMO

## 2014-02-26 ENCOUNTER — Encounter (HOSPITAL_BASED_OUTPATIENT_CLINIC_OR_DEPARTMENT_OTHER): Payer: Self-pay | Admitting: *Deleted

## 2014-02-26 ENCOUNTER — Inpatient Hospital Stay (HOSPITAL_BASED_OUTPATIENT_CLINIC_OR_DEPARTMENT_OTHER)
Admission: EM | Admit: 2014-02-26 | Discharge: 2014-03-09 | DRG: 371 | Disposition: A | Discharge status: Home Health | Payer: Medicare HMO | Attending: Internal Medicine | Admitting: Internal Medicine

## 2014-02-26 DIAGNOSIS — K651 Peritoneal abscess: Secondary | ICD-10-CM | POA: Insufficient documentation

## 2014-02-26 DIAGNOSIS — T383X5A Adverse effect of insulin and oral hypoglycemic [antidiabetic] drugs, initial encounter: Secondary | ICD-10-CM | POA: Diagnosis present

## 2014-02-26 DIAGNOSIS — I129 Hypertensive chronic kidney disease with stage 1 through stage 4 chronic kidney disease, or unspecified chronic kidney disease: Secondary | ICD-10-CM | POA: Diagnosis present

## 2014-02-26 DIAGNOSIS — D72829 Elevated white blood cell count, unspecified: Secondary | ICD-10-CM | POA: Diagnosis present

## 2014-02-26 DIAGNOSIS — K353 Acute appendicitis with localized peritonitis: Principal | ICD-10-CM | POA: Diagnosis present

## 2014-02-26 DIAGNOSIS — I10 Essential (primary) hypertension: Secondary | ICD-10-CM | POA: Diagnosis present

## 2014-02-26 DIAGNOSIS — N739 Female pelvic inflammatory disease, unspecified: Secondary | ICD-10-CM

## 2014-02-26 DIAGNOSIS — Z87891 Personal history of nicotine dependence: Secondary | ICD-10-CM

## 2014-02-26 DIAGNOSIS — Z880 Allergy status to penicillin: Secondary | ICD-10-CM

## 2014-02-26 DIAGNOSIS — K5732 Diverticulitis of large intestine without perforation or abscess without bleeding: Secondary | ICD-10-CM | POA: Diagnosis present

## 2014-02-26 DIAGNOSIS — R7989 Other specified abnormal findings of blood chemistry: Secondary | ICD-10-CM | POA: Diagnosis present

## 2014-02-26 DIAGNOSIS — E876 Hypokalemia: Secondary | ICD-10-CM | POA: Diagnosis present

## 2014-02-26 DIAGNOSIS — E1122 Type 2 diabetes mellitus with diabetic chronic kidney disease: Secondary | ICD-10-CM | POA: Diagnosis present

## 2014-02-26 DIAGNOSIS — Y9289 Other specified places as the place of occurrence of the external cause: Secondary | ICD-10-CM

## 2014-02-26 DIAGNOSIS — J45909 Unspecified asthma, uncomplicated: Secondary | ICD-10-CM | POA: Diagnosis present

## 2014-02-26 DIAGNOSIS — Z809 Family history of malignant neoplasm, unspecified: Secondary | ICD-10-CM

## 2014-02-26 DIAGNOSIS — R1031 Right lower quadrant pain: Secondary | ICD-10-CM | POA: Diagnosis not present

## 2014-02-26 DIAGNOSIS — K631 Perforation of intestine (nontraumatic): Secondary | ICD-10-CM | POA: Diagnosis present

## 2014-02-26 DIAGNOSIS — N182 Chronic kidney disease, stage 2 (mild): Secondary | ICD-10-CM | POA: Diagnosis present

## 2014-02-26 DIAGNOSIS — E785 Hyperlipidemia, unspecified: Secondary | ICD-10-CM | POA: Diagnosis present

## 2014-02-26 DIAGNOSIS — M4800 Spinal stenosis, site unspecified: Secondary | ICD-10-CM | POA: Diagnosis present

## 2014-02-26 DIAGNOSIS — E119 Type 2 diabetes mellitus without complications: Secondary | ICD-10-CM

## 2014-02-26 DIAGNOSIS — Z9049 Acquired absence of other specified parts of digestive tract: Secondary | ICD-10-CM | POA: Diagnosis present

## 2014-02-26 LAB — URINE MICROSCOPIC-ADD ON

## 2014-02-26 LAB — CBC WITH DIFFERENTIAL/PLATELET
BASOS PCT: 0 % (ref 0–1)
Basophils Absolute: 0 10*3/uL (ref 0.0–0.1)
EOS ABS: 0 10*3/uL (ref 0.0–0.7)
EOS PCT: 1 % (ref 0–5)
HCT: 38.6 % (ref 36.0–46.0)
Hemoglobin: 13.3 g/dL (ref 12.0–15.0)
Lymphocytes Relative: 20 % (ref 12–46)
Lymphs Abs: 0.7 10*3/uL (ref 0.7–4.0)
MCH: 30.9 pg (ref 26.0–34.0)
MCHC: 34.5 g/dL (ref 30.0–36.0)
MCV: 89.6 fL (ref 78.0–100.0)
Monocytes Absolute: 0.1 10*3/uL (ref 0.1–1.0)
Monocytes Relative: 2 % — ABNORMAL LOW (ref 3–12)
Neutro Abs: 2.7 10*3/uL (ref 1.7–7.7)
Neutrophils Relative %: 77 % (ref 43–77)
Platelets: 153 10*3/uL (ref 150–400)
RBC: 4.31 MIL/uL (ref 3.87–5.11)
RDW: 13.9 % (ref 11.5–15.5)
WBC: 3.6 10*3/uL — ABNORMAL LOW (ref 4.0–10.5)

## 2014-02-26 LAB — COMPREHENSIVE METABOLIC PANEL
ALK PHOS: 225 U/L — AB (ref 39–117)
ALT: 57 U/L — ABNORMAL HIGH (ref 0–35)
ANION GAP: 14 (ref 5–15)
AST: 60 U/L — ABNORMAL HIGH (ref 0–37)
Albumin: 3.5 g/dL (ref 3.5–5.2)
BILIRUBIN TOTAL: 0.4 mg/dL (ref 0.3–1.2)
BUN: 14 mg/dL (ref 6–23)
CHLORIDE: 92 meq/L — AB (ref 96–112)
CO2: 27 meq/L (ref 19–32)
Calcium: 9.4 mg/dL (ref 8.4–10.5)
Creatinine, Ser: 0.7 mg/dL (ref 0.50–1.10)
GFR calc Af Amer: >90 mL/min (ref >90)
GFR, EST NON AFRICAN AMERICAN: 79 mL/min — AB (ref >90)
Glucose, Bld: 135 mg/dL — ABNORMAL HIGH (ref 70–99)
Potassium: 3.5 mEq/L — ABNORMAL LOW (ref 3.7–5.3)
Sodium: 133 mEq/L — ABNORMAL LOW (ref 137–147)
Total Protein: 7.6 g/dL (ref 6.0–8.3)

## 2014-02-26 LAB — URINALYSIS, ROUTINE W REFLEX MICROSCOPIC
BILIRUBIN URINE: NEGATIVE
GLUCOSE, UA: NEGATIVE mg/dL
Hgb urine dipstick: NEGATIVE
KETONES UR: 15 mg/dL — AB
Nitrite: NEGATIVE
PROTEIN: NEGATIVE mg/dL
Specific Gravity, Urine: 1.029 (ref 1.005–1.030)
Urobilinogen, UA: 0.2 mg/dL (ref 0.0–1.0)
pH: 7.5 (ref 5.0–8.0)

## 2014-02-26 LAB — I-STAT CG4 LACTIC ACID, ED: Lactic Acid, Venous: 0.97 mmol/L (ref 0.5–2.2)

## 2014-02-26 MED ORDER — ONDANSETRON HCL 4 MG/2ML IJ SOLN
4.0000 mg | Freq: Once | INTRAMUSCULAR | Status: AC
  Administered 2014-02-26: 4 mg via INTRAVENOUS
  Filled 2014-02-26: qty 2

## 2014-02-26 MED ORDER — METRONIDAZOLE IN NACL 5-0.79 MG/ML-% IV SOLN: 500.0000 mg | Freq: Once | INTRAVENOUS | Status: DC

## 2014-02-26 MED ORDER — CIPROFLOXACIN IN D5W 400 MG/200ML IV SOLN: 400.0000 mg | Freq: Once | INTRAVENOUS | Status: DC

## 2014-02-26 MED ORDER — IOHEXOL 300 MG/ML  SOLN
25.0000 mL | Freq: Once | INTRAMUSCULAR | Status: AC | PRN
Start: 2014-02-26 — End: 2014-02-26
  Administered 2014-02-26: 25 mL via ORAL

## 2014-02-26 MED ORDER — IOHEXOL 300 MG/ML  SOLN
100.0000 mL | Freq: Once | INTRAMUSCULAR | Status: AC | PRN
  Administered 2014-02-26: 100 mL via INTRAVENOUS

## 2014-02-26 MED ORDER — HYDROMORPHONE HCL 1 MG/ML IJ SOLN
1.0000 mg | Freq: Once | INTRAMUSCULAR | Status: AC
  Administered 2014-02-26: 1 mg via INTRAVENOUS
  Filled 2014-02-26: qty 1

## 2014-02-26 MED ORDER — MORPHINE SULFATE 4 MG/ML IJ SOLN
4.0000 mg | Freq: Once | INTRAMUSCULAR | Status: AC
  Administered 2014-02-26: 4 mg via INTRAVENOUS
  Filled 2014-02-26: qty 1

## 2014-02-26 MED ORDER — PIPERACILLIN-TAZOBACTAM 3.375 G IVPB
3.3750 g | Freq: Once | INTRAVENOUS | Status: DC
  Administered 2014-02-26: 3.375 g via INTRAVENOUS
  Filled 2014-02-26: qty 50

## 2014-02-26 NOTE — ED Notes (Signed)
abd pain and pressure this afternoon- unable to have BM but feels she needs to

## 2014-02-26 NOTE — ED Provider Notes (Addendum)
CSN: [id number]     Arrival date & time 02/26/14  1931 History  This chart was scribed for Blanchie Dessert, MD by Molli Posey, ED Scribe. This patient was seen in room MH02/MH02 and the patient's care was started 7:49 PM.    Chief Complaint  Patient presents with  . Abdominal Pain   The history is provided by the patient. No language interpreter was used.   HPI Comments: Kristine Burnett is a 80 y.o. female with a history of cholecystectomy who presents to the Emergency Department complaining of lower abdominal pain since this afternoon. She describes the pain as sharp. Pt states she has abdominal pressure and is unable to have a BM but feels like she needs to. Pt reports she has had diarrhea recently but not today. She says she normally does not have abdominal problems. Pt states she has been able to urinate normally today. She states she took aleve for her pain which helped for a little while but then her pain returned. Pt states the last time she ate was 11AM today. She states none of her medications have changed recently. She states her metformin medication has always given her diarrhea as a side effect. Pt eports she has refilled the metformin medication 3-4 times since she started it. She denies fever, dysuria, vomiting and back pain.    Past Medical History  Diagnosis Date  . Hypertension   . Hyperlipidemia   . Diabetes mellitus without complication   . Vitamin D deficiency   . Asthma   . DDD (degenerative disc disease)   . Spinal stenosis   . Type II or unspecified type diabetes mellitus without mention of complication, not stated as uncontrolled 03/22/2013   Past Surgical History  Procedure Laterality Date  . Hernia repair    . Cyst excision  back  . Cholecystectomy     Family History  Problem Relation Age of Onset  . Heart attack Mother   . Cancer Mother     breast  . Hypertension Mother   . Heart attack Father   . Stroke Sister   . Cancer Sister     breast  . Heart  attack Brother   . Heart attack Brother    History  Substance Use Topics  . Smoking status: Never Smoker   . Smokeless tobacco: Not on file  . Alcohol Use: No   OB History    No data available     Review of Systems  Constitutional: Negative for fever.  Gastrointestinal: Positive for nausea, abdominal pain and constipation. Negative for vomiting.  Genitourinary: Negative for dysuria.  Musculoskeletal: Negative for back pain.  All other systems reviewed and are negative.  Allergies  Ace inhibitors; Crestor; Grapefruit extract; Minoxidil; and Penicillins  Home Medications   Prior to Admission medications   Medication Sig Start Date End Date Taking? Authorizing Provider  aspirin 325 MG tablet Take 325 mg by mouth daily.   Yes Historical Provider, MD  atenolol (TENORMIN) 100 MG tablet TAKE 1 TABLET BY MOUTH DAILY FOR BLOOD PRESSURE 12/02/13  Yes Unk Pinto, MD  Cholecalciferol (VITAMIN D-3) 1000 UNITS CAPS Take 4,000 Units by mouth daily.   Yes Historical Provider, MD  Cyanocobalamin (VITAMIN B-12) 2500 MCG SUBL Place 1 tablet under the tongue daily.   Yes Historical Provider, MD  diltiazem (CARDIZEM) 120 MG tablet TAKE 1 TABLET BY MOUTH TWICE DAILY FOR HIGH BLOOD PRESSURE 08/03/13  Yes Vicie Mutters, PA-C  enalapril (VASOTEC) 20 MG tablet TAKE 1  TABLET BY MOUTH TWICE DAILY 01/28/14  Yes Unk Pinto, MD  Flaxseed, Linseed, (FLAXSEED OIL) 1000 MG CAPS Take 1,000 mg by mouth 4 (four) times daily.   Yes Historical Provider, MD  hydrochlorothiazide (HYDRODIURIL) 25 MG tablet TAKE 1 TABLET BY MOUTH EVERY MORNING 12/02/13  Yes Unk Pinto, MD  Lancets (FREESTYLE) lancets Test glucose 1 time daily 08/16/13  Yes Unk Pinto, MD  Magnesium Oxide (MAG-OX PO) Take 500 mg by mouth 2 (two) times daily.   Yes Historical Provider, MD  metFORMIN (GLUCOPHAGE) 500 MG tablet Take 1 tablet (500 mg total) by mouth 2 (two) times daily with a meal. 06/27/13 06/27/14 Yes Melissa R Smith, PA-C   Multiple Vitamins-Minerals (MULTIVITAMIN & MINERAL PO) Take by mouth daily.   Yes Historical Provider, MD  naproxen sodium (ANAPROX) 220 MG tablet Take 220 mg by mouth 2 (two) times daily with a meal.   Yes Historical Provider, MD  oxybutynin (DITROPAN) 5 MG tablet TAKE 1 TABLET BY MOUTH TWICE DAILY FOR BLADDER 01/28/14  Yes Unk Pinto, MD  simvastatin (ZOCOR) 40 MG tablet TAKE 1 TABLET BY MOUTH EVERY NIGHT AT BEDTIME FOR CHOLESTEROL 08/03/13  Yes Vicie Mutters, PA-C  alendronate (FOSAMAX) 70 MG tablet Take 1 tablet (70 mg total) by mouth once a week. Take with a full glass of water on an empty stomach. 10/29/13   Unk Pinto, MD  baclofen (LIORESAL) 10 MG tablet TAKE 1/2 TO 1 TABLET BY MOUTH THREE TIMES DAILY AS NEEDED FOR MUSCLE SPASM    Melissa R Smith, PA-C  baclofen (LIORESAL) 10 MG tablet TAKE 1/2 TO 1 TABLET BY MOUTH THREE TIMES DAILY AS NEEDED FOR MUSCLE SPASM 10/18/13   Vicie Mutters, PA-C  hydrochlorothiazide (HYDRODIURIL) 25 MG tablet TAKE 1 TABLET BY MOUTH EVERY MORNING 12/02/13   Unk Pinto, MD   BP 178/78 mmHg  Pulse 67  Temp(Src) 98.4 F (36.9 C) (Oral)  Resp 18  Ht 5\' 7"  (1.702 m)  Wt 152 lb (68.947 kg)  BMI 23.80 kg/m2  SpO2 99% Physical Exam  Constitutional: She is oriented to person, place, and time. She appears well-developed and well-nourished.  Appears uncomfortable.   HENT:  Head: Normocephalic and atraumatic.  Eyes: Right eye exhibits no discharge. Left eye exhibits no discharge.  Neck: Neck supple. No tracheal deviation present.  Cardiovascular: Normal rate.   Pulmonary/Chest: Effort normal. No respiratory distress.  Abdominal: She exhibits no distension. There is tenderness. There is rebound and guarding.  RLQ tenderness with guarding and rebound. No CVA tenderness.   Neurological: She is alert and oriented to person, place, and time.  Skin: Skin is warm and dry.  Psychiatric: She has a normal mood and affect. Her behavior is normal.  Nursing  note and vitals reviewed.   ED Course  Procedures   DIAGNOSTIC STUDIES: Oxygen Saturation is 99% on RA, normal by my interpretation.    COORDINATION OF CARE: 7:55 PM Discussed treatment plan with pt at bedside and pt agreed to plan.   Labs Review Labs Reviewed  CBC WITH DIFFERENTIAL - Abnormal; Notable for the following:    WBC 3.6 (*)    Monocytes Relative 2 (*)    All other components within normal limits  COMPREHENSIVE METABOLIC PANEL - Abnormal; Notable for the following:    Sodium 133 (*)    Potassium 3.5 (*)    Chloride 92 (*)    Glucose, Bld 135 (*)    AST 60 (*)    ALT 57 (*)  Alkaline Phosphatase 225 (*)    GFR calc non Af Amer 79 (*)    All other components within normal limits  URINALYSIS, ROUTINE W REFLEX MICROSCOPIC - Abnormal; Notable for the following:    Ketones, ur 15 (*)    Leukocytes, UA SMALL (*)    All other components within normal limits  URINE MICROSCOPIC-ADD ON - Abnormal; Notable for the following:    Squamous Epithelial / LPF FEW (*)    All other components within normal limits  I-STAT CG4 LACTIC ACID, ED    Imaging Review Ct Abdomen Pelvis W Contrast  02/26/2014   CLINICAL DATA:  Right lower quadrant pain common nausea comminuted and vomiting.  EXAM: CT ABDOMEN AND PELVIS WITH CONTRAST  TECHNIQUE: Multidetector CT imaging of the abdomen and pelvis was performed using the standard protocol following bolus administration of intravenous contrast.  CONTRAST:  30mL OMNIPAQUE IOHEXOL 300 MG/ML SOLN, 126mL OMNIPAQUE IOHEXOL 300 MG/ML SOLN  COMPARISON:  12/30/2008  FINDINGS: Mild dependent atelectasis in the lung bases. Mild central bronchiectasis in the right lung base. Residual contrast material in the lower esophagus likely represents dysmotility or reflux. No esophageal dilatation.  Surgical absence of the gallbladder intra and extrahepatic bile duct dilatation. Bile duct dilatation is likely physiologic in the postoperative setting however  noncalcified stone is not excluded. No focal liver lesions. The pancreas, spleen, adrenal glands, abdominal aorta, inferior vena cava, and retroperitoneal lymph nodes are unremarkable. 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 Distal vessel appears patent. Stomach is unremarkable. Small bowel are mostly decompressed. Gas and stool filled colon. Small amount of free fluid in the upper abdomen along the pericolic gutters and extending into the pelvis. With small free gas bubbles are demonstrated in the abdomen. Appearance suggests bowel perforation. Cause is not determined.  Pelvis: The bladder wall is not thickened. No pelvic mass or lymphadenopathy identified. Appendix is not visualized. There is infiltration and focal free air demonstrated in the right lower quadrant and given the history of right lower quadrant pain, a perforated appendix should be considered. No evidence of diverticulitis. Degenerative changes in the lumbar spine. Slight anterior subluxation of L4 on L5 and L3 on L4, likely degenerative.  IMPRESSION: Free fluid and small amount of free air in the abdomen suspicious for bowel perforation. Cause not identified. Appendix is not visualized but inflammatory changes are present in the right lower quadrant, possibly indicating perforated appendicitis. 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 Vascular compromise to the bowel is not excluded. Surgical absence of the gallbladder with prominent intra and extrahepatic bile duct dilatation. This is likely postoperative but occult noncalcified common duct stone is not excluded.  These results were called by telephone at the time of interpretation on 02/26/2014 at 10:25 pm to Dr. Blanchie Dessert , who verbally acknowledged these results.   Electronically Signed   By: Lucienne Capers M.D.   On: 02/26/2014 22:31     EKG Interpretation None       MDM   Final diagnoses:  RLQ abdominal pain  Small bowel perforation   Patient with what she describes as abrupt onset of abdominal pain with significant guarding and rebound in the right lower quadrant since this afternoon. Concern for appendicitis versus kidney stone. Patient is hemodynamically stable and low suspicion for ruptured AAA.  Patient given pain and nausea medication.  CBC, CMP, UA, lactic acid, CT of the abdomen and pelvis pending  10:58 PM Patient's labs without significant findings other than mild LFT elevation. CT showed free fluid and a small amount of free air suspicious for a bowel perforation without identified cause. Findings were discussed with Dr. Kaylyn Lim with surgery. Patient will be transferred to [facility] for further evaluation. She was started on Zosyn and continued pain control.  Findings discussed with patient and her family.  11:18 PM Pt was going to get zosyn here however allergy to pcn.  Will transfer to [facility] where pt can get invanz  I personally performed the services described in this documentation, which was scribed in my presence.  The recorded information has been reviewed and considered.      Blanchie Dessert, MD 02/26/14 [id number]  Blanchie Dessert, MD 02/26/14 [place], MD 02/26/14 [phone]

## 2014-02-27 ENCOUNTER — Encounter (HOSPITAL_COMMUNITY): Payer: Self-pay | Admitting: Internal Medicine

## 2014-02-27 DIAGNOSIS — K631 Perforation of intestine (nontraumatic): Secondary | ICD-10-CM | POA: Diagnosis present

## 2014-02-27 DIAGNOSIS — E876 Hypokalemia: Secondary | ICD-10-CM | POA: Diagnosis present

## 2014-02-27 DIAGNOSIS — Z9049 Acquired absence of other specified parts of digestive tract: Secondary | ICD-10-CM | POA: Diagnosis present

## 2014-02-27 DIAGNOSIS — D72829 Elevated white blood cell count, unspecified: Secondary | ICD-10-CM | POA: Diagnosis present

## 2014-02-27 DIAGNOSIS — Z809 Family history of malignant neoplasm, unspecified: Secondary | ICD-10-CM | POA: Diagnosis not present

## 2014-02-27 DIAGNOSIS — Y9289 Other specified places as the place of occurrence of the external cause: Secondary | ICD-10-CM | POA: Diagnosis not present

## 2014-02-27 DIAGNOSIS — N182 Chronic kidney disease, stage 2 (mild): Secondary | ICD-10-CM | POA: Diagnosis present

## 2014-02-27 DIAGNOSIS — Z880 Allergy status to penicillin: Secondary | ICD-10-CM | POA: Diagnosis not present

## 2014-02-27 DIAGNOSIS — Z87891 Personal history of nicotine dependence: Secondary | ICD-10-CM | POA: Diagnosis not present

## 2014-02-27 DIAGNOSIS — T383X5A Adverse effect of insulin and oral hypoglycemic [antidiabetic] drugs, initial encounter: Secondary | ICD-10-CM | POA: Diagnosis present

## 2014-02-27 DIAGNOSIS — R7989 Other specified abnormal findings of blood chemistry: Secondary | ICD-10-CM | POA: Diagnosis present

## 2014-02-27 DIAGNOSIS — I129 Hypertensive chronic kidney disease with stage 1 through stage 4 chronic kidney disease, or unspecified chronic kidney disease: Secondary | ICD-10-CM | POA: Diagnosis present

## 2014-02-27 DIAGNOSIS — M4800 Spinal stenosis, site unspecified: Secondary | ICD-10-CM | POA: Diagnosis present

## 2014-02-27 DIAGNOSIS — K5732 Diverticulitis of large intestine without perforation or abscess without bleeding: Secondary | ICD-10-CM | POA: Diagnosis present

## 2014-02-27 DIAGNOSIS — E1122 Type 2 diabetes mellitus with diabetic chronic kidney disease: Secondary | ICD-10-CM | POA: Diagnosis present

## 2014-02-27 DIAGNOSIS — E785 Hyperlipidemia, unspecified: Secondary | ICD-10-CM | POA: Diagnosis present

## 2014-02-27 DIAGNOSIS — E119 Type 2 diabetes mellitus without complications: Secondary | ICD-10-CM

## 2014-02-27 DIAGNOSIS — R1031 Right lower quadrant pain: Secondary | ICD-10-CM | POA: Diagnosis present

## 2014-02-27 DIAGNOSIS — J45909 Unspecified asthma, uncomplicated: Secondary | ICD-10-CM | POA: Diagnosis present

## 2014-02-27 DIAGNOSIS — K353 Acute appendicitis with localized peritonitis: Secondary | ICD-10-CM | POA: Diagnosis present

## 2014-02-27 DIAGNOSIS — I10 Essential (primary) hypertension: Secondary | ICD-10-CM

## 2014-02-27 LAB — COMPREHENSIVE METABOLIC PANEL
ALK PHOS: 177 U/L — AB (ref 39–117)
ALT: 47 U/L — ABNORMAL HIGH (ref 0–35)
ANION GAP: 14 (ref 5–15)
AST: 46 U/L — ABNORMAL HIGH (ref 0–37)
Albumin: 3 g/dL — ABNORMAL LOW (ref 3.5–5.2)
BILIRUBIN TOTAL: 0.4 mg/dL (ref 0.3–1.2)
BUN: 12 mg/dL (ref 6–23)
CHLORIDE: 91 meq/L — AB (ref 96–112)
CO2: 26 mEq/L (ref 19–32)
Calcium: 8.9 mg/dL (ref 8.4–10.5)
Creatinine, Ser: 0.72 mg/dL (ref 0.50–1.10)
GFR calc non Af Amer: 78 mL/min — ABNORMAL LOW (ref >90)
GLUCOSE: 132 mg/dL — AB (ref 70–99)
Potassium: 3.6 mEq/L — ABNORMAL LOW (ref 3.7–5.3)
Sodium: 131 mEq/L — ABNORMAL LOW (ref 137–147)
TOTAL PROTEIN: 6.9 g/dL (ref 6.0–8.3)

## 2014-02-27 LAB — CBC WITH DIFFERENTIAL/PLATELET
BASOS ABS: 0 10*3/uL (ref 0.0–0.1)
BASOS PCT: 0 % (ref 0–1)
Eosinophils Absolute: 0 10*3/uL (ref 0.0–0.7)
Eosinophils Relative: 0 % (ref 0–5)
HCT: 38.4 % (ref 36.0–46.0)
Hemoglobin: 12.7 g/dL (ref 12.0–15.0)
Lymphocytes Relative: 13 % (ref 12–46)
Lymphs Abs: 0.7 10*3/uL (ref 0.7–4.0)
MCH: 30.5 pg (ref 26.0–34.0)
MCHC: 33.1 g/dL (ref 30.0–36.0)
MCV: 92.3 fL (ref 78.0–100.0)
Monocytes Absolute: 0.5 10*3/uL (ref 0.1–1.0)
Monocytes Relative: 8 % (ref 3–12)
NEUTROS ABS: 4.4 10*3/uL (ref 1.7–7.7)
NEUTROS PCT: 79 % — AB (ref 43–77)
Platelets: 146 10*3/uL — ABNORMAL LOW (ref 150–400)
RBC: 4.16 MIL/uL (ref 3.87–5.11)
RDW: 14.3 % (ref 11.5–15.5)
WBC: 5.6 10*3/uL (ref 4.0–10.5)

## 2014-02-27 LAB — GLUCOSE, CAPILLARY
GLUCOSE-CAPILLARY: 119 mg/dL — AB (ref 70–99)
GLUCOSE-CAPILLARY: 103 mg/dL — AB (ref 70–99)
Glucose-Capillary: 126 mg/dL — ABNORMAL HIGH (ref 70–99)
Glucose-Capillary: 88 mg/dL (ref 70–99)
Glucose-Capillary: 90 mg/dL (ref 70–99)

## 2014-02-27 MED ORDER — HYDRALAZINE HCL 20 MG/ML IJ SOLN: 10.0000 mg | INTRAMUSCULAR | Status: DC | PRN

## 2014-02-27 MED ORDER — ACETAMINOPHEN 650 MG RE SUPP: 650.0000 mg | Freq: Four times a day (QID) | RECTAL | Status: DC | PRN

## 2014-02-27 MED ORDER — ONDANSETRON HCL 4 MG/2ML IJ SOLN: 4.0000 mg | Freq: Four times a day (QID) | INTRAMUSCULAR | Status: DC

## 2014-02-27 MED ORDER — SODIUM CHLORIDE 0.9 % IV SOLN
1.0000 g | INTRAVENOUS | Status: DC
  Administered 2014-02-27 – 2014-03-08 (×10): 1 g via INTRAVENOUS
  Filled 2014-02-27 (×10): qty 1

## 2014-02-27 MED ORDER — ONDANSETRON HCL 4 MG/2ML IJ SOLN: 4.0000 mg | Freq: Four times a day (QID) | INTRAMUSCULAR | Status: DC | PRN

## 2014-02-27 MED ORDER — METOPROLOL TARTRATE 1 MG/ML IV SOLN
2.5000 mg | Freq: Four times a day (QID) | INTRAVENOUS | Status: DC
  Administered 2014-02-27 – 2014-03-04 (×15): 2.5 mg via INTRAVENOUS
  Filled 2014-02-27 (×25): qty 5

## 2014-02-27 MED ORDER — ONDANSETRON HCL 4 MG PO TABS
4.0000 mg | ORAL_TABLET | Freq: Four times a day (QID) | ORAL | Status: DC | PRN
Start: 2014-02-27 — End: 2014-02-27

## 2014-02-27 MED ORDER — ERTAPENEM SODIUM 1 G IJ SOLR
1.0000 g | Freq: Once | INTRAMUSCULAR | Status: AC
  Administered 2014-02-27: 1 g via INTRAVENOUS
  Filled 2014-02-27: qty 1

## 2014-02-27 MED ORDER — ACETAMINOPHEN 325 MG PO TABS: 650.0000 mg | ORAL_TABLET | Freq: Four times a day (QID) | ORAL | Status: DC | PRN

## 2014-02-27 MED ORDER — CETYLPYRIDINIUM CHLORIDE 0.05 % MT LIQD
7.0000 mL | Freq: Two times a day (BID) | OROMUCOSAL | Status: DC
  Administered 2014-02-27 – 2014-03-09 (×18): 7 mL via OROMUCOSAL

## 2014-02-27 MED ORDER — POTASSIUM CHLORIDE IN NACL 20-0.9 MEQ/L-% IV SOLN
INTRAVENOUS | Status: DC
  Administered 2014-02-27 (×2): via INTRAVENOUS
  Filled 2014-02-27 (×3): qty 1000

## 2014-02-27 MED ORDER — HYDROMORPHONE HCL 1 MG/ML IJ SOLN
1.0000 mg | INTRAMUSCULAR | Status: DC | PRN
  Administered 2014-02-27 – 2014-03-08 (×18): 1 mg via INTRAVENOUS
  Filled 2014-02-27 (×18): qty 1

## 2014-02-27 MED ORDER — SODIUM CHLORIDE 0.9 % IV SOLN
INTRAVENOUS | Status: DC
  Administered 2014-02-27: 125 mL/h via INTRAVENOUS

## 2014-02-27 MED ORDER — INSULIN ASPART 100 UNIT/ML ~~LOC~~ SOLN
0.0000 [IU] | SUBCUTANEOUS | Status: DC
  Administered 2014-02-28 – 2014-03-01 (×3): 2 [IU] via SUBCUTANEOUS
  Administered 2014-03-01: 1 [IU] via SUBCUTANEOUS
  Administered 2014-03-01: 2 [IU] via SUBCUTANEOUS
  Administered 2014-03-02: 1 [IU] via SUBCUTANEOUS

## 2014-02-27 MED ORDER — ONDANSETRON HCL 4 MG/2ML IJ SOLN
4.0000 mg | Freq: Four times a day (QID) | INTRAMUSCULAR | Status: DC | PRN
  Administered 2014-02-27: 4 mg via INTRAVENOUS
  Filled 2014-02-27 (×2): qty 2

## 2014-02-27 NOTE — Consult Note (Signed)
Kristine Burnett 02-Oct-1929  [id number].   Primary Care MD: Dr. Unk Pinto Requesting MD: Dr. Gean Birchwood Chief Complaint/Reason for Consult: perforated viscus HPI: This is a very pleasant 80 yo black female who has DM and HTN.  She states she started having diffuse lower abdominal pain that was "knife-like" and sharp.  It was at times worse on the right side.  She denies nausea or vomiting.  She states she always has diarrhea.  This started when she was placed on metformin over 6 months ago.  Incidentally, she has also lost about 32 lbs in the last 4-6 months.  She denies night sweats or fevers.  Her pcp told her to take imodium as needed for her diarrhea.  She denies blood in her stools.  Due to severe pain, she presented to [facility] ED where she had a CT Scan that revealed free fluid and a small amount of free air in the abdomen suspicious for a bowel perforation.  The appendix is not visualized, but inflammatory changes are present in the right lower quadrant.  She was transferred to the hospitalist service at [facility] where we have been consulted.  ROS : Please see HPI, otherwise all other systems are currently negative  Family History  Problem Relation Age of Onset  . Heart attack Mother   . Cancer Mother     breast  . Hypertension Mother   . Heart attack Father   . Stroke Sister   . Cancer Sister     breast  . Heart attack Brother   . Heart attack Brother     Past Medical History  Diagnosis Date  . Hypertension   . Hyperlipidemia   . Diabetes mellitus without complication   . Vitamin D deficiency   . Asthma   . DDD (degenerative disc disease)   . Spinal stenosis   . Type II or unspecified type diabetes mellitus without mention of complication, not stated as uncontrolled 03/22/2013    Past Surgical History  Procedure Laterality Date  . Hernia repair    . Cyst excision  back  . Cholecystectomy    . Mini-laparotomy w/ tubal ligation      Social History:   reports that she has quit smoking. She does not have any smokeless tobacco history on file. She reports that she does not drink alcohol or use illicit drugs.  Allergies:  Allergies  Allergen Reactions  . Ace Inhibitors Cough  . Crestor [Rosuvastatin]   . Grapefruit Extract   . Minoxidil   . Penicillins     Medications Prior to Admission  Medication Sig Dispense Refill  . aspirin 325 MG tablet Take 325 mg by mouth daily.    Marland Kitchen atenolol (TENORMIN) 100 MG tablet TAKE 1 TABLET BY MOUTH DAILY FOR BLOOD PRESSURE 90 tablet 1  . Cholecalciferol (VITAMIN D-3) 1000 UNITS CAPS Take 4,000 Units by mouth daily.    . Cyanocobalamin (VITAMIN B-12) 2500 MCG SUBL Place 1 tablet under the tongue once a week.     . diltiazem (CARDIZEM) 120 MG tablet TAKE 1 TABLET BY MOUTH TWICE DAILY FOR HIGH BLOOD PRESSURE 180 tablet 98  . enalapril (VASOTEC) 20 MG tablet TAKE 1 TABLET BY MOUTH TWICE DAILY 60 tablet 2  . Flaxseed, Linseed, (FLAXSEED OIL) 1000 MG CAPS Take 1,000 mg by mouth 4 (four) times daily.    . hydrochlorothiazide (HYDRODIURIL) 25 MG tablet TAKE 1 TABLET BY MOUTH EVERY MORNING 90 tablet 98  . Magnesium Oxide (MAG-OX  PO) Take 500 mg by mouth 2 (two) times daily.    . metFORMIN (GLUCOPHAGE) 500 MG tablet Take 1 tablet (500 mg total) by mouth 2 (two) times daily with a meal. 60 tablet 3  . Multiple Vitamins-Minerals (MULTIVITAMIN & MINERAL PO) Take by mouth daily.    . naproxen sodium (ANAPROX) 220 MG tablet Take 220 mg by mouth 2 (two) times daily with a meal.    . oxybutynin (DITROPAN) 5 MG tablet TAKE 1 TABLET BY MOUTH TWICE DAILY FOR BLADDER 60 tablet 5  . simvastatin (ZOCOR) 40 MG tablet TAKE 1 TABLET BY MOUTH EVERY NIGHT AT BEDTIME FOR CHOLESTEROL 90 tablet 98  . alendronate (FOSAMAX) 70 MG tablet Take 1 tablet (70 mg total) by mouth once a week. Take with a full glass of water on an empty stomach. 4 tablet 2  . baclofen (LIORESAL) 10 MG tablet TAKE 1/2 TO 1 TABLET BY MOUTH THREE TIMES DAILY AS  NEEDED FOR MUSCLE SPASM 90 tablet 0  . baclofen (LIORESAL) 10 MG tablet TAKE 1/2 TO 1 TABLET BY MOUTH THREE TIMES DAILY AS NEEDED FOR MUSCLE SPASM 90 tablet 0  . hydrochlorothiazide (HYDRODIURIL) 25 MG tablet TAKE 1 TABLET BY MOUTH EVERY MORNING 90 tablet 98  . Lancets (FREESTYLE) lancets Test glucose 1 time daily 100 each 1    Blood pressure 119/54, pulse 80, temperature 98.2 F (36.8 C), temperature source Oral, resp. rate 16, height _0  (1.702 m), weight 154 lb 8 oz (70.081 kg), SpO2 100 %. Physical Exam: General: pleasant, elderly black female who is laying in bed in NAD HEENT: head is normocephalic, atraumatic.  Sclera are noninjected.  PERRL.  Ears and nose without any masses or lesions.  Mouth is pink and moist Heart: regular, rate, and rhythm.  Normal s1,s2. No obvious murmurs, gallops, or rubs noted.  Palpable radial and pedal pulses bilaterally Lungs: CTAB, no wheezes, rhonchi, or rales noted.  Respiratory effort nonlabored Abd: soft, NT, ND, +BS, no masses, hernias, or organomegaly MS: all 4 extremities are symmetrical with no cyanosis, clubbing, or edema. Skin: warm and dry with no masses, lesions, or rashes Psych: A&Ox3 with an appropriate affect.    Results for orders placed or performed during the hospital encounter of 02/26/14 (from the past 48 hour(s))  I-Stat CG4 Lactic Acid, ED     Status: None   Collection Time: 02/26/14  8:05 PM  Result Value Ref Range   Lactic Acid, Venous 0.97 0.5 - 2.2 mmol/L  CBC with Differential     Status: Abnormal   Collection Time: 02/26/14  8:06 PM  Result Value Ref Range   WBC 3.6 (L) 4.0 - 10.5 K/uL   RBC 4.31 3.87 - 5.11 MIL/uL   Hemoglobin 13.3 12.0 - 15.0 g/dL   HCT 38.6 36.0 - 46.0 %   MCV 89.6 78.0 - 100.0 fL   MCH 30.9 26.0 - 34.0 pg   MCHC 34.5 30.0 - 36.0 g/dL   RDW 13.9 11.5 - 15.5 %   Platelets 153 150 - 400 K/uL   Neutrophils Relative % 77 43 - 77 %   Neutro Abs 2.7 1.7 - 7.7 K/uL   Lymphocytes Relative 20 12 - 46 %    Lymphs Abs 0.7 0.7 - 4.0 K/uL   Monocytes Relative 2 (L) 3 - 12 %   Monocytes Absolute 0.1 0.1 - 1.0 K/uL   Eosinophils Relative 1 0 - 5 %   Eosinophils Absolute 0.0 0.0 - 0.7 K/uL  Basophils Relative 0 0 - 1 %   Basophils Absolute 0.0 0.0 - 0.1 K/uL  Comprehensive metabolic panel     Status: Abnormal   Collection Time: 02/26/14  8:06 PM  Result Value Ref Range   Sodium 133 (L) 137 - 147 mEq/L   Potassium 3.5 (L) 3.7 - 5.3 mEq/L   Chloride 92 (L) 96 - 112 mEq/L   CO2 27 19 - 32 mEq/L   Glucose, Bld 135 (H) 70 - 99 mg/dL   BUN 14 6 - 23 mg/dL   Creatinine, Ser 0.70 0.50 - 1.10 mg/dL   Calcium 9.4 8.4 - 10.5 mg/dL   Total Protein 7.6 6.0 - 8.3 g/dL   Albumin 3.5 3.5 - 5.2 g/dL   AST 60 (H) 0 - 37 U/L   ALT 57 (H) 0 - 35 U/L   Alkaline Phosphatase 225 (H) 39 - 117 U/L   Total Bilirubin 0.4 0.3 - 1.2 mg/dL   GFR calc non Af Amer 79 (L) >90 mL/min   GFR calc Af Amer >90 >90 mL/min    Comment: (NOTE) The eGFR has been calculated using the CKD EPI equation. This calculation has not been validated in all clinical situations. eGFR's persistently <90 mL/min signify possible Chronic Kidney Disease.    Anion gap 14 5 - 15  Urinalysis, Routine w reflex microscopic     Status: Abnormal   Collection Time: 02/26/14 10:30 PM  Result Value Ref Range   Color, Urine YELLOW YELLOW   APPearance CLEAR CLEAR   Specific Gravity, Urine 1.029 1.005 - 1.030   pH 7.5 5.0 - 8.0   Glucose, UA NEGATIVE NEGATIVE mg/dL   Hgb urine dipstick NEGATIVE NEGATIVE   Bilirubin Urine NEGATIVE NEGATIVE   Ketones, ur 15 (A) NEGATIVE mg/dL   Protein, ur NEGATIVE NEGATIVE mg/dL   Urobilinogen, UA 0.2 0.0 - 1.0 mg/dL   Nitrite NEGATIVE NEGATIVE   Leukocytes, UA SMALL (A) NEGATIVE  Urine microscopic-add on     Status: Abnormal   Collection Time: 02/26/14 10:30 PM  Result Value Ref Range   Squamous Epithelial / LPF FEW (A) RARE   WBC, UA 3-6 <3 WBC/hpf   RBC / HPF 0-2 <3 RBC/hpf   Bacteria, UA RARE RARE    Urine-Other MUCOUS PRESENT   Comprehensive metabolic panel     Status: Abnormal   Collection Time: 02/27/14  4:55 AM  Result Value Ref Range   Sodium 131 (L) 137 - 147 mEq/L   Potassium 3.6 (L) 3.7 - 5.3 mEq/L   Chloride 91 (L) 96 - 112 mEq/L   CO2 26 19 - 32 mEq/L   Glucose, Bld 132 (H) 70 - 99 mg/dL   BUN 12 6 - 23 mg/dL   Creatinine, Ser 0.72 0.50 - 1.10 mg/dL   Calcium 8.9 8.4 - 10.5 mg/dL   Total Protein 6.9 6.0 - 8.3 g/dL   Albumin 3.0 (L) 3.5 - 5.2 g/dL   AST 46 (H) 0 - 37 U/L    Comment: SLIGHT HEMOLYSIS HEMOLYSIS AT THIS LEVEL MAY AFFECT RESULT    ALT 47 (H) 0 - 35 U/L   Alkaline Phosphatase 177 (H) 39 - 117 U/L   Total Bilirubin 0.4 0.3 - 1.2 mg/dL   GFR calc non Af Amer 78 (L) >90 mL/min   GFR calc Af Amer >90 >90 mL/min    Comment: (NOTE) The eGFR has been calculated using the CKD EPI equation. This calculation has not been validated in all clinical situations. eGFR's  persistently <90 mL/min signify possible Chronic Kidney Disease.    Anion gap 14 5 - 15  CBC with Differential     Status: Abnormal   Collection Time: 02/27/14  4:55 AM  Result Value Ref Range   WBC 5.6 4.0 - 10.5 K/uL   RBC 4.16 3.87 - 5.11 MIL/uL   Hemoglobin 12.7 12.0 - 15.0 g/dL   HCT 38.4 36.0 - 46.0 %   MCV 92.3 78.0 - 100.0 fL   MCH 30.5 26.0 - 34.0 pg   MCHC 33.1 30.0 - 36.0 g/dL   RDW 14.3 11.5 - 15.5 %   Platelets 146 (L) 150 - 400 K/uL   Neutrophils Relative % 79 (H) 43 - 77 %   Neutro Abs 4.4 1.7 - 7.7 K/uL   Lymphocytes Relative 13 12 - 46 %   Lymphs Abs 0.7 0.7 - 4.0 K/uL   Monocytes Relative 8 3 - 12 %   Monocytes Absolute 0.5 0.1 - 1.0 K/uL   Eosinophils Relative 0 0 - 5 %   Eosinophils Absolute 0.0 0.0 - 0.7 K/uL   Basophils Relative 0 0 - 1 %   Basophils Absolute 0.0 0.0 - 0.1 K/uL  Glucose, capillary     Status: Abnormal   Collection Time: 02/27/14  5:32 AM  Result Value Ref Range   Glucose-Capillary 126 (H) 70 - 99 mg/dL  Glucose, capillary     Status: Abnormal    Collection Time: 02/27/14  7:58 AM  Result Value Ref Range   Glucose-Capillary 119 (H) 70 - 99 mg/dL  Glucose, capillary     Status: Abnormal   Collection Time: 02/27/14 11:49 AM  Result Value Ref Range   Glucose-Capillary 103 (H) 70 - 99 mg/dL   Ct Abdomen Pelvis W Contrast  02/26/2014   CLINICAL DATA:  Right lower quadrant pain common nausea comminuted and vomiting.  EXAM: CT ABDOMEN AND PELVIS WITH CONTRAST  TECHNIQUE: Multidetector CT imaging of the abdomen and pelvis was performed using the standard protocol following bolus administration of intravenous contrast.  CONTRAST:  7m OMNIPAQUE IOHEXOL 300 MG/ML SOLN, 1078mOMNIPAQUE IOHEXOL 300 MG/ML SOLN  COMPARISON:  12/30/2008  FINDINGS: Mild dependent atelectasis in the lung bases. Mild central bronchiectasis in the right lung base. Residual contrast material in the lower esophagus likely represents dysmotility or reflux. No esophageal dilatation.  Surgical absence of the gallbladder intra and extrahepatic bile duct dilatation. Bile duct dilatation is likely physiologic in the postoperative setting however noncalcified stone is not excluded. No focal liver lesions. The pancreas, spleen, adrenal glands, abdominal aorta, inferior vena cava, and retroperitoneal lymph nodes are unremarkable. 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 Distal vessel appears patent. Stomach is unremarkable. Small bowel are mostly decompressed. Gas and stool filled colon. Small amount of free fluid in the upper abdomen along the pericolic gutters and extending into the pelvis. With small free gas bubbles are demonstrated in the abdomen. Appearance suggests bowel perforation. Cause is not determined.  Pelvis: The bladder wall is not thickened. No pelvic mass or lymphadenopathy identified. Appendix is not visualized. There is infiltration and focal free air demonstrated in the right lower quadrant and given the history of  right lower quadrant pain, a perforated appendix should be considered. No evidence of diverticulitis. Degenerative changes in the lumbar spine. Slight anterior subluxation of L4 on L5 and L3 on L4, likely degenerative.  IMPRESSION: Free fluid and small amount of free air in the abdomen suspicious  for bowel perforation. Cause not identified. Appendix is not visualized but inflammatory changes are present in the right lower quadrant, possibly indicating perforated appendicitis. 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 Vascular compromise to the bowel is not excluded. Surgical absence of the gallbladder with prominent intra and extrahepatic bile duct dilatation. This is likely postoperative but occult noncalcified common duct stone is not excluded.  These results were called by telephone at the time of interpretation on 02/26/2014 at 10:25 pm to Dr. Blanchie Dessert , who verbally acknowledged these results.   Electronically Signed   By: Lucienne Capers M.D.   On: 02/26/2014 22:31       Assessment/Plan 1. Contained microperforation of viscus, ? Right colon 2. DM 3. HTN 4. Diarrhea 5. Weight loss  Plan: 1. The patient's CT scan shows fluid and inflammatory changes noted in the RLQ around the cecum.  There are a few small bubble of free air, but no frank pneumoperitoneum.  Her appendix is not seen on her CT scan, but her story is not consistent with acute perforated appendicitis as her pain acutely started yesterday.  This seems more consistently with diverticulitis of the right colon vs perforation secondary to neoplasm.  I am concerned about the latter due to a 32lbs weight loss in the last 4-6 months.  She has had chronic diarrhea from her metformin, but I'm skeptical to think this would cause this significant amount of weight loss.  She had a colonoscopy in 2010.  She will likely need one in 4-6 weeks after this admission if she improves while here.    2. Currently while here, we will treat her with conservative management of bowel rest and IV abx therapy for now.  If she continues to improve, then would defer surgical treatment at this time, but she will need further work up as an outpatient once this acute episode resolves.  Thank you for this consult.  We will follow with you.  Shelanda Duvall E 02/27/2014, 2:23 PM Pager: [phone]

## 2014-02-27 NOTE — H&P (Signed)
[facility] History and Physical  REDONNA WILBERT VOH:[id number] DOB: 1929/11/22 DOA: 02/26/2014  Referring physician: ER physician. PCP: Alesia Richards, MD   Chief Complaint: Abdominal pain.  HPI: Kristine Burnett is a 80 y.o. female with history of diabetes mellitus, hypertension and hyperlipidemia presented to the ER because of abdominal pain. Patient states she has been having abdominal pain since morning. Patient's abdominal pain is mostly in the lower quadrants. Has had some nausea but denies any vomiting or fever or chills. 2 days ago patient had multiple episodes of diarrhea and she had taken some Imodium. In [place] and [place] patient had a CT abdomen and pelvis which showed which is concerning for perforation and on-call surgeon Dr. Hassell Done was consulted and patient was transferred to [facility]. On exam patient's abdomen is soft and the pain is controlled with pain relief medication. Patient is not in acute distress. Patient is admitted for further management of bowel perforation.   Review of Systems: As presented in the history of presenting illness, rest negative.  Past Medical History  Diagnosis Date  . Hypertension   . Hyperlipidemia   . Diabetes mellitus without complication   . Vitamin D deficiency   . Asthma   . DDD (degenerative disc disease)   . Spinal stenosis   . Type II or unspecified type diabetes mellitus without mention of complication, not stated as uncontrolled 03/22/2013   Past Surgical History  Procedure Laterality Date  . Hernia repair    . Cyst excision  back  . Cholecystectomy     Social History:  reports that she has never smoked. She does not have any smokeless tobacco history on file. She reports that she does not drink alcohol or use illicit drugs. Where does patient live home. Can patient participate in ADLs? Yes.  Allergies  Allergen Reactions  . Ace Inhibitors Cough  . Crestor [Rosuvastatin]   . Grapefruit Extract    . Minoxidil   . Penicillins     Family History:  Family History  Problem Relation Age of Onset  . Heart attack Mother   . Cancer Mother     breast  . Hypertension Mother   . Heart attack Father   . Stroke Sister   . Cancer Sister     breast  . Heart attack Brother   . Heart attack Brother       Prior to Admission medications   Medication Sig Start Date End Date Taking? Authorizing Provider  aspirin 325 MG tablet Take 325 mg by mouth daily.   Yes Historical Provider, MD  atenolol (TENORMIN) 100 MG tablet TAKE 1 TABLET BY MOUTH DAILY FOR BLOOD PRESSURE 12/02/13  Yes Unk Pinto, MD  Cholecalciferol (VITAMIN D-3) 1000 UNITS CAPS Take 4,000 Units by mouth daily.   Yes Historical Provider, MD  Cyanocobalamin (VITAMIN B-12) 2500 MCG SUBL Place 1 tablet under the tongue once a week.    Yes Historical Provider, MD  diltiazem (CARDIZEM) 120 MG tablet TAKE 1 TABLET BY MOUTH TWICE DAILY FOR HIGH BLOOD PRESSURE 08/03/13  Yes Vicie Mutters, PA-C  enalapril (VASOTEC) 20 MG tablet TAKE 1 TABLET BY MOUTH TWICE DAILY 01/28/14  Yes Unk Pinto, MD  Flaxseed, Linseed, (FLAXSEED OIL) 1000 MG CAPS Take 1,000 mg by mouth 4 (four) times daily.   Yes Historical Provider, MD  hydrochlorothiazide (HYDRODIURIL) 25 MG tablet TAKE 1 TABLET BY MOUTH EVERY MORNING 12/02/13  Yes Unk Pinto, MD  Magnesium Oxide (MAG-OX PO) Take 500  mg by mouth 2 (two) times daily.   Yes Historical Provider, MD  metFORMIN (GLUCOPHAGE) 500 MG tablet Take 1 tablet (500 mg total) by mouth 2 (two) times daily with a meal. 06/27/13 06/27/14 Yes Melissa R Smith, PA-C  Multiple Vitamins-Minerals (MULTIVITAMIN & MINERAL PO) Take by mouth daily.   Yes Historical Provider, MD  naproxen sodium (ANAPROX) 220 MG tablet Take 220 mg by mouth 2 (two) times daily with a meal.   Yes Historical Provider, MD  oxybutynin (DITROPAN) 5 MG tablet TAKE 1 TABLET BY MOUTH TWICE DAILY FOR BLADDER 01/28/14  Yes Unk Pinto, MD  simvastatin (ZOCOR)  40 MG tablet TAKE 1 TABLET BY MOUTH EVERY NIGHT AT BEDTIME FOR CHOLESTEROL 08/03/13  Yes Vicie Mutters, PA-C  alendronate (FOSAMAX) 70 MG tablet Take 1 tablet (70 mg total) by mouth once a week. Take with a full glass of water on an empty stomach. 10/29/13   Unk Pinto, MD  baclofen (LIORESAL) 10 MG tablet TAKE 1/2 TO 1 TABLET BY MOUTH THREE TIMES DAILY AS NEEDED FOR MUSCLE SPASM    Melissa R Smith, PA-C  baclofen (LIORESAL) 10 MG tablet TAKE 1/2 TO 1 TABLET BY MOUTH THREE TIMES DAILY AS NEEDED FOR MUSCLE SPASM 10/18/13   Vicie Mutters, PA-C  hydrochlorothiazide (HYDRODIURIL) 25 MG tablet TAKE 1 TABLET BY MOUTH EVERY MORNING 12/02/13   Unk Pinto, MD  Lancets (FREESTYLE) lancets Test glucose 1 time daily 08/16/13   Unk Pinto, MD    Physical Exam: Filed Vitals:   02/26/14 2245 02/26/14 2325 02/27/14 0023 02/27/14 0222  BP:  177/69 160/73 147/59  Pulse:  65 64 67  Temp:   98.3 F (36.8 C) 98.3 F (36.8 C)  TempSrc:   Oral Oral  Resp:  20 12 16   Height:    5\' 7"  (1.702 m)  Weight:    70.081 kg (154 lb 8 oz)  SpO2: 96% 100% 100% 100%     General:  Well-developed and nourished.  Eyes: Anicteric no pallor.  ENT: No discharge from the ears eyes nose and mouth.  Neck: No mass felt.  Cardiovascular: S1 and S2 heard.  Respiratory: No rhonchi or crepitations.  Abdomen: Soft mild tenderness in the right lower quadrant no guarding or rigidity.  Skin: No rash.  Musculoskeletal: No edema.  Psychiatric: Appears normal.  Neurologic: Alert awake oriented to time place and person. Moves all activities.  Labs on Admission:  Basic Metabolic Panel:  Recent Labs Lab 02/26/14 2006  NA 133*  K 3.5*  CL 92*  CO2 27  GLUCOSE 135*  BUN 14  CREATININE 0.70  CALCIUM 9.4   Liver Function Tests:  Recent Labs Lab 02/26/14 2006  AST 60*  ALT 57*  ALKPHOS 225*  BILITOT 0.4  PROT 7.6  ALBUMIN 3.5   No results for input(s): LIPASE, AMYLASE in the last 168 hours. No  results for input(s): AMMONIA in the last 168 hours. CBC:  Recent Labs Lab 02/26/14 2006  WBC 3.6*  NEUTROABS 2.7  HGB 13.3  HCT 38.6  MCV 89.6  PLT 153   Cardiac Enzymes: No results for input(s): CKTOTAL, CKMB, CKMBINDEX, TROPONINI in the last 168 hours.  BNP (last 3 results) No results for input(s): PROBNP in the last 8760 hours. CBG: No results for input(s): GLUCAP in the last 168 hours.  Radiological Exams on Admission: Ct Abdomen Pelvis W Contrast  02/26/2014   CLINICAL DATA:  Right lower quadrant pain common nausea comminuted and vomiting.  EXAM: CT ABDOMEN  AND PELVIS WITH CONTRAST  TECHNIQUE: Multidetector CT imaging of the abdomen and pelvis was performed using the standard protocol following bolus administration of intravenous contrast.  CONTRAST:  49mL OMNIPAQUE IOHEXOL 300 MG/ML SOLN, 125mL OMNIPAQUE IOHEXOL 300 MG/ML SOLN  COMPARISON:  12/30/2008  FINDINGS: Mild dependent atelectasis in the lung bases. Mild central bronchiectasis in the right lung base. Residual contrast material in the lower esophagus likely represents dysmotility or reflux. No esophageal dilatation.  Surgical absence of the gallbladder intra and extrahepatic bile duct dilatation. Bile duct dilatation is likely physiologic in the postoperative setting however noncalcified stone is not excluded. No focal liver lesions. The pancreas, spleen, adrenal glands, abdominal aorta, inferior vena cava, and retroperitoneal lymph nodes are unremarkable. 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 Distal vessel appears patent. Stomach is unremarkable. Small bowel are mostly decompressed. Gas and stool filled colon. Small amount of free fluid in the upper abdomen along the pericolic gutters and extending into the pelvis. With small free gas bubbles are demonstrated in the abdomen. Appearance suggests bowel perforation. Cause is not determined.  Pelvis: The bladder wall is not  thickened. No pelvic mass or lymphadenopathy identified. Appendix is not visualized. There is infiltration and focal free air demonstrated in the right lower quadrant and given the history of right lower quadrant pain, a perforated appendix should be considered. No evidence of diverticulitis. Degenerative changes in the lumbar spine. Slight anterior subluxation of L4 on L5 and L3 on L4, likely degenerative.  IMPRESSION: Free fluid and small amount of free air in the abdomen suspicious for bowel perforation. Cause not identified. Appendix is not visualized but inflammatory changes are present in the right lower quadrant, possibly indicating perforated appendicitis. 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 Vascular compromise to the bowel is not excluded. Surgical absence of the gallbladder with prominent intra and extrahepatic bile duct dilatation. This is likely postoperative but occult noncalcified common duct stone is not excluded.  These results were called by telephone at the time of interpretation on 02/26/2014 at 10:25 pm to Dr. Blanchie Dessert , who verbally acknowledged these results.   Electronically Signed   By: Lucienne Capers M.D.   On: 02/26/2014 22:31    Assessment/Plan Principal Problem:   Bowel perforation Active Problems:   Essential hypertension   CKD stage 2 due to type 2 diabetes mellitus   Diabetes mellitus type 2, controlled   1. Bowel perforation - I have discussed with on-call surgeon Dr. Hassell Done who will be seeing patient in consult. Dr. Hassell Done is advised to keep patient nothing by mouth and continue with Invanz antibiotic. Continue with gentle hydration and pain relief medications. Further recommendations per surgery. 2. Diabetes mellitus type 2 - since patient is nothing by mouth patient has been placed on standing scale coverage closely follow CBG. 3. Hypertension - since patient is nothing by mouth and was on rate  limiting medications I have placed patient on metoprolol IV scheduled dose and when necessary IV hydralazine. 4. Hypertension - continue statins when patient can take orally. 5. Elevated LFTs - closely follow LFT trends. If there is further worsening patient may need to hold off her statins.    Code Status: Full code.  Family Communication: Patient's daughters at the bedside.  Disposition Plan: Admit to inpatient.    Omero Kowal N. [facility] Pager [phone].  If 7PM-7AM, please contact night-coverage [email or web address] Password [email or web address] 02/27/2014, 3:42 AM

## 2014-02-27 NOTE — ED Notes (Signed)
Bed: ZD63 Expected date:  Expected time:  Means of arrival:  Comments: PT from [place] with Bowel Perf

## 2014-02-27 NOTE — Progress Notes (Addendum)
Patient ID: Kristine Burnett, female   DOB: 04-17-1929, 80 y.o.   MRN: [id number] [facility] PROGRESS NOTE  SAMARIE PINDER WIO:[id number] DOB: [birth year] DOA: 02/26/2014 PCP: Alesia Richards, MD  Brief narrative:    Addendum to admission note done 02/27/2014. 80 y.o. female with history of diabetes mellitus, hypertension and hyperlipidemia who presented to [facility] ED with complaints of lower abdominal pain radiating to bilateral sides for past 2 days prior to this admission. Patient reported some nausea but no vomiting or fevers. On admission, patient was hemodynamically stable. Evaluation included CT abdomen and pelvis which was concerning for bowel perforation. Surgery will see the patient in consultation.   Assessment/Plan:    Principal Problem:   Bowel perforation - CT abdomen and pelvis with contrast showed free fluid and small amount of free air in the abdomen suspicious for bowel perforation. - Appreciate surgery consult for recommendations. - Patient reports feeling little better, she rates her pain in the abdomen 5 out of 10 this morning. - We will continue current IV fluids which is normal saline with potassium supplementation. - Continue ertapenem 1 g IV every 24 hours.  Active problems:  Hypokalemia - Secondary to GI losses. Potassium being supplemented through IV fluids.    DVT Prophylaxis  - SCD's bilaterally   Code Status: Full.  Family Communication:  plan of care discussed with the patient Disposition Plan: Home when stable.   IV access:   Peripheral IV  Procedures and diagnostic studies:    Ct Abdomen Pelvis W Contrast 02/26/2014   Free fluid and small amount of free air in the abdomen suspicious for bowel perforation. Cause not identified. Appendix is not visualized but inflammatory changes are present in the right lower quadrant, possibly indicating perforated appendicitis. 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 Vascular compromise to the bowel is not excluded. Surgical absence of the gallbladder with prominent intra and extrahepatic bile duct dilatation. This is likely postoperative but occult noncalcified common duct stone is not excluded.     Medical Consultants:   Surgery   Other Consultants:   None   IAnti-Infectives:    Ertapenem 02/27/2014 -->   Leisa Lenz, MD  [facility] Pager [phone]  If 7PM-7AM, please contact night-coverage [email or web address] Password [email or web address] 02/27/2014, 11:02 AM   LOS: 1 day    HPI/Subjective: No acute overnight events.  Objective: Filed Vitals:   02/26/14 2325 02/27/14 0023 02/27/14 0222 02/27/14 0529  BP: 177/69 160/73 147/59 129/54  Pulse: 65 64 67 67  Temp:  98.3 F (36.8 C) 98.3 F (36.8 C) 98.1 F (36.7 C)  TempSrc:  Oral Oral Oral  Resp: 20 12 16 16   Height:   5\' 7"  (1.702 m)   Weight:   70.081 kg (154 lb 8 oz)   SpO2: 100% 100% 100% 100%    Intake/Output Summary (Last 24 hours) at 02/27/14 1102 Last data filed at 02/27/14 0756  Gross per 24 hour  Intake 238.33 ml  Output      0 ml  Net 238.33 ml    Exam:   General:  Pt is alert, follows commands appropriately, not in acute distress  Cardiovascular: Regular rate and rhythm, S1/S2, no murmurs  Respiratory: Clear to auscultation bilaterally, no wheezing, no crackles, no rhonchi  Abdomen: Tender in the lower abdomen, bowel sounds present  Extremities: No edema, pulses DP and PT palpable bilaterally  Neuro: Grossly nonfocal  Data Reviewed: Basic Metabolic Panel:  Recent  Labs Lab 02/26/14 2006 02/27/14 0455  NA 133* 131*  K 3.5* 3.6*  CL 92* 91*  CO2 27 26  GLUCOSE 135* 132*  BUN 14 12  CREATININE 0.70 0.72  CALCIUM 9.4 8.9   Liver Function Tests:  Recent Labs Lab 02/26/14 2006 02/27/14 0455  AST 60* 46*  ALT 57* 47*  ALKPHOS 225* 177*  BILITOT 0.4 0.4  PROT 7.6 6.9  ALBUMIN 3.5 3.0*   No results for input(s): LIPASE,  AMYLASE in the last 168 hours. No results for input(s): AMMONIA in the last 168 hours. CBC:  Recent Labs Lab 02/26/14 2006 02/27/14 0455  WBC 3.6* 5.6  NEUTROABS 2.7 4.4  HGB 13.3 12.7  HCT 38.6 38.4  MCV 89.6 92.3  PLT 153 146*   Cardiac Enzymes: No results for input(s): CKTOTAL, CKMB, CKMBINDEX, TROPONINI in the last 168 hours. BNP: Invalid input(s): POCBNP CBG:  Recent Labs Lab 02/27/14 0532 02/27/14 0758  GLUCAP 126* 119*    No results found for this or any previous visit (from the past 240 hour(s)).   Scheduled Meds: . antiseptic oral rinse  7 mL Mouth Rinse BID  . ertapenem  1 g Intravenous Q24H  . insulin aspart  0-9 Units Subcutaneous Q4H  . metoprolol  2.5 mg Intravenous 4 times per day   Continuous Infusions: . 0.9 % NaCl with KCl 20 mEq / L 100 mL/hr at 02/27/14 0533

## 2014-02-27 NOTE — Plan of Care (Signed)
Problem: Consults Goal: General Medical Patient Education See Patient Education Module for specific education. Outcome: Completed/Met Date Met:  02/27/14 Goal: Skin Care Protocol Initiated - if Braden Score 18 or less If consults are not indicated, leave blank or document N/A Outcome: Completed/Met Date Met:  02/27/14

## 2014-02-28 LAB — BASIC METABOLIC PANEL
Anion gap: 9 (ref 5–15)
BUN: 16 mg/dL (ref 6–23)
CALCIUM: 8.4 mg/dL (ref 8.4–10.5)
CO2: 27 mEq/L (ref 19–32)
Chloride: 99 mEq/L (ref 96–112)
Creatinine, Ser: 0.75 mg/dL (ref 0.50–1.10)
GFR calc Af Amer: 89 mL/min — ABNORMAL LOW (ref >90)
GFR, EST NON AFRICAN AMERICAN: 77 mL/min — AB (ref >90)
GLUCOSE: 88 mg/dL (ref 70–99)
Potassium: 4.3 mEq/L (ref 3.7–5.3)
Sodium: 135 mEq/L — ABNORMAL LOW (ref 137–147)

## 2014-02-28 LAB — CBC
HEMATOCRIT: 32.1 % — AB (ref 36.0–46.0)
HEMOGLOBIN: 10.8 g/dL — AB (ref 12.0–15.0)
MCH: 30.5 pg (ref 26.0–34.0)
MCHC: 33.6 g/dL (ref 30.0–36.0)
MCV: 90.7 fL (ref 78.0–100.0)
Platelets: 154 10*3/uL (ref 150–400)
RBC: 3.54 MIL/uL — ABNORMAL LOW (ref 3.87–5.11)
RDW: 14.6 % (ref 11.5–15.5)
WBC: 9.7 10*3/uL (ref 4.0–10.5)

## 2014-02-28 LAB — GLUCOSE, CAPILLARY
GLUCOSE-CAPILLARY: 72 mg/dL (ref 70–99)
GLUCOSE-CAPILLARY: 157 mg/dL — AB (ref 70–99)
GLUCOSE-CAPILLARY: 155 mg/dL — AB (ref 70–99)
Glucose-Capillary: 101 mg/dL — ABNORMAL HIGH (ref 70–99)
Glucose-Capillary: 86 mg/dL (ref 70–99)
Glucose-Capillary: 78 mg/dL (ref 70–99)

## 2014-02-28 MED ORDER — SODIUM CHLORIDE 0.9 % IV SOLN
INTRAVENOUS | Status: AC
  Administered 2014-02-28: 12:00:00 via INTRAVENOUS

## 2014-02-28 NOTE — Progress Notes (Signed)
Patient ID: Kristine Burnett, female   DOB: Dec 06, 1929, 80 y.o.   MRN: [id number] [facility] PROGRESS NOTE  Kristine Burnett HER:[id number] DOB: [birth year] DOA: 02/26/2014 PCP: Alesia Richards, MD  Brief narrative:    80 y.o. female with history of diabetes mellitus, hypertension and hyperlipidemia who presented to [facility] ED with complaints of lower abdominal pain radiating to bilateral sides for past 2 days prior to this admission. Patient reported some nausea but no vomiting or fevers. On admission, patient was hemodynamically stable. Evaluation included CT abdomen and pelvis which was concerning for bowel perforation. So far, surgery not indicated since patient is improving with current management which includes IVF, abx.  Assessment/Plan:    Principal Problem:  Bowel perforation - CT abdomen and pelvis with contrast showed free fluid and small amount of free air in the abdomen suspicious for bowel perforation. - Appreciate surgery consult for recommendations. For now, supportive care since patient improving. Continue IVF and antibiotics, ertapenem. Analgesia and antiemetics as needed.  - Diet: clear liquid  Active problems:  Hypokalemia - Secondary to GI losses. Potassium was supplemented through IV fluids. Potassium is 4.3.  Essential hypertension - Continue metoprolol 2.5 mg IV every 6 hours. If patient tolerates PO intake then will switch to patient's home meds.  Diabetes mellitus, controlled with no complications - E5U in 31/4970 6.1 indicating good glycemic control - metformin on hold until pt tolerates PO intake  - continue SSI  - CBG's in past 24 hours: 86, 78, 72  DVT Prophylaxis  - SCD's bilaterally   Code Status: Full.  Family Communication: plan of care discussed with the patient Disposition Plan: Home when stable.     IV access:   Peripheral IV  Procedures and diagnostic studies:   Ct Abdomen Pelvis W Contrast 02/26/2014 Free fluid and small  amount of free air in the abdomen suspicious for bowel perforation. Cause not identified. Appendix is not visualized but inflammatory changes are present in the right lower quadrant, possibly indicating perforated appendicitis. 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 Vascular compromise to the bowel is not excluded. Surgical absence of the gallbladder with prominent intra and extrahepatic bile duct dilatation. This is likely postoperative but occult noncalcified common duct stone is not excluded.   Medical Consultants:  Surgery  Other Consultants:  None  IAnti-Infectives:   Ertapenem 02/27/2014 -->   Leisa Lenz, MD  [facility] Pager [phone]  If 7PM-7AM, please contact night-coverage [email or web address] Password [email or web address] 02/28/2014, 3:04 PM   LOS: 2 days    HPI/Subjective: No acute overnight events.  Objective: Filed Vitals:   02/27/14 1338 02/27/14 2049 02/28/14 0013 02/28/14 0618  BP: 119/54 129/54 120/58 120/60  Pulse: 80 83 83 86  Temp: 98.2 F (36.8 C) 98.2 F (36.8 C)  99.2 F (37.3 C)  TempSrc: Oral Oral  Oral  Resp: 16 18  16   Height:      Weight:    71.668 kg (158 lb)  SpO2: 100%   100%    Intake/Output Summary (Last 24 hours) at 02/28/14 1504 Last data filed at 02/28/14 1311  Gross per 24 hour  Intake 2138.33 ml  Output    375 ml  Net 1763.33 ml    Exam:   General:  Pt is alert, follows commands appropriately, not in acute distress  Cardiovascular: Regular rate and rhythm, S1/S2, no murmurs  Respiratory: Clear to auscultation bilaterally, no wheezing, no crackles, no rhonchi  Abdomen: tender  in lower abdomen to palpation, no distention, (+) BS  Extremities: No edema, pulses DP and PT palpable bilaterally  Neuro: Grossly nonfocal  Data Reviewed: Basic Metabolic Panel:  Recent Labs Lab 02/26/14 2006 02/27/14 0455 02/28/14 0415  NA 133* 131* 135*  K 3.5* 3.6* 4.3  CL 92*  91* 99  CO2 27 26 27   GLUCOSE 135* 132* 88  BUN 14 12 16   CREATININE 0.70 0.72 0.75  CALCIUM 9.4 8.9 8.4   Liver Function Tests:  Recent Labs Lab 02/26/14 2006 02/27/14 0455  AST 60* 46*  ALT 57* 47*  ALKPHOS 225* 177*  BILITOT 0.4 0.4  PROT 7.6 6.9  ALBUMIN 3.5 3.0*   No results for input(s): LIPASE, AMYLASE in the last 168 hours. No results for input(s): AMMONIA in the last 168 hours. CBC:  Recent Labs Lab 02/26/14 2006 02/27/14 0455 02/28/14 0415  WBC 3.6* 5.6 9.7  NEUTROABS 2.7 4.4  --   HGB 13.3 12.7 10.8*  HCT 38.6 38.4 32.1*  MCV 89.6 92.3 90.7  PLT 153 146* 154   Cardiac Enzymes: No results for input(s): CKTOTAL, CKMB, CKMBINDEX, TROPONINI in the last 168 hours. BNP: Invalid input(s): POCBNP CBG:  Recent Labs Lab 02/27/14 2008 02/28/14 0015 02/28/14 0337 02/28/14 0754 02/28/14 1157  GLUCAP 90 101* 86 78 72    No results found for this or any previous visit (from the past 240 hour(s)).   Scheduled Meds: . antiseptic oral rinse  7 mL Mouth Rinse BID  . ertapenem  1 g Intravenous Q24H  . insulin aspart  0-9 Units Subcutaneous Q4H  . metoprolol  2.5 mg Intravenous 4 times per day   Continuous Infusions: . sodium chloride 50 mL/hr at 02/28/14 1228

## 2014-02-28 NOTE — Progress Notes (Signed)
Patient ID: Kristine Burnett, female   DOB: 06-30-1929, 80 y.o.   MRN: [id number]    Subjective: Pt with slight discomfort in the suprapubic region, but otherwise ok  Objective: Vital signs in last 24 hours: Temp:  [98.2 F (36.8 C)-99.2 F (37.3 C)] 99.2 F (37.3 C) (12/11 0618) Pulse Rate:  [80-86] 86 (12/11 0618) Resp:  [16-18] 16 (12/11 0618) BP: (119-129)/(54-60) 120/60 mmHg (12/11 0618) SpO2:  [100 %] 100 % (12/11 0618) Weight:  [158 lb (71.668 kg)] 158 lb (71.668 kg) (12/11 0618) Last BM Date: 02/24/14  Intake/Output from previous day: 12/10 0701 - 12/11 0700 In: 238.3 [I.V.:238.3] Out: 375 [Urine:375] Intake/Output this shift: Total I/O In: 1898.3 [I.V.:1898.3] Out: -   PE: Abd: soft, NT, ND, +BS  Lab Results:   Recent Labs  02/27/14 0455 02/28/14 0415  WBC 5.6 9.7  HGB 12.7 10.8*  HCT 38.4 32.1*  PLT 146* 154   BMET  Recent Labs  02/27/14 0455 02/28/14 0415  NA 131* 135*  K 3.6* 4.3  CL 91* 99  CO2 26 27  GLUCOSE 132* 88  BUN 12 16  CREATININE 0.72 0.75  CALCIUM 8.9 8.4   PT/INR No results for input(s): LABPROT, INR in the last 72 hours. CMP     Component Value Date/Time   NA 135* 02/28/2014 0415   K 4.3 02/28/2014 0415   CL 99 02/28/2014 0415   CO2 27 02/28/2014 0415   GLUCOSE 88 02/28/2014 0415   BUN 16 02/28/2014 0415   CREATININE 0.75 02/28/2014 0415   CREATININE 0.90 12/25/2013 1111   CALCIUM 8.4 02/28/2014 0415   PROT 6.9 02/27/2014 0455   ALBUMIN 3.0* 02/27/2014 0455   AST 46* 02/27/2014 0455   ALT 47* 02/27/2014 0455   ALKPHOS 177* 02/27/2014 0455   BILITOT 0.4 02/27/2014 0455   GFRNONAA 77* 02/28/2014 0415   GFRNONAA 60 12/25/2013 1111   GFRAA 89* 02/28/2014 0415   GFRAA 69 12/25/2013 1111   Lipase     Component Value Date/Time   LIPASE 17 12/30/2008 0003       Studies/Results: Ct Abdomen Pelvis W Contrast  02/26/2014   CLINICAL DATA:  Right lower quadrant pain common nausea comminuted and vomiting.  EXAM: CT  ABDOMEN AND PELVIS WITH CONTRAST  TECHNIQUE: Multidetector CT imaging of the abdomen and pelvis was performed using the standard protocol following bolus administration of intravenous contrast.  CONTRAST:  77mL OMNIPAQUE IOHEXOL 300 MG/ML SOLN, 155mL OMNIPAQUE IOHEXOL 300 MG/ML SOLN  COMPARISON:  12/30/2008  FINDINGS: Mild dependent atelectasis in the lung bases. Mild central bronchiectasis in the right lung base. Residual contrast material in the lower esophagus likely represents dysmotility or reflux. No esophageal dilatation.  Surgical absence of the gallbladder intra and extrahepatic bile duct dilatation. Bile duct dilatation is likely physiologic in the postoperative setting however noncalcified stone is not excluded. No focal liver lesions. The pancreas, spleen, adrenal glands, abdominal aorta, inferior vena cava, and retroperitoneal lymph nodes are unremarkable. 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 Distal vessel appears patent. Stomach is unremarkable. Small bowel are mostly decompressed. Gas and stool filled colon. Small amount of free fluid in the upper abdomen along the pericolic gutters and extending into the pelvis. With small free gas bubbles are demonstrated in the abdomen. Appearance suggests bowel perforation. Cause is not determined.  Pelvis: The bladder wall is not thickened. No pelvic mass or lymphadenopathy identified. Appendix is not visualized. There is infiltration and  focal free air demonstrated in the right lower quadrant and given the history of right lower quadrant pain, a perforated appendix should be considered. No evidence of diverticulitis. Degenerative changes in the lumbar spine. Slight anterior subluxation of L4 on L5 and L3 on L4, likely degenerative.  IMPRESSION: Free fluid and small amount of free air in the abdomen suspicious for bowel perforation. Cause not identified. Appendix is not visualized but inflammatory changes  are present in the right lower quadrant, possibly indicating perforated appendicitis. 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 Vascular compromise to the bowel is not excluded. Surgical absence of the gallbladder with prominent intra and extrahepatic bile duct dilatation. This is likely postoperative but occult noncalcified common duct stone is not excluded.  These results were called by telephone at the time of interpretation on 02/26/2014 at 10:25 pm to Dr. Blanchie Dessert , who verbally acknowledged these results.   Electronically Signed   By: Lucienne Capers M.D.   On: 02/26/2014 22:31    Anti-infectives: Anti-infectives    Start     Dose/Rate Route Frequency Ordered Stop   02/27/14 2200  ertapenem (INVANZ) 1 g in sodium chloride 0.9 % 50 mL IVPB     1 g100 mL/hr over 30 Minutes Intravenous Every 24 hours 02/27/14 0341     02/27/14 0045  ertapenem (INVANZ) 1 g in sodium chloride 0.9 % 50 mL IVPB     1 g100 mL/hr over 30 Minutes Intravenous  Once 02/27/14 0031 02/27/14 0134   02/26/14 2300  piperacillin-tazobactam (ZOSYN) IVPB 3.375 g  Status:  Discontinued     3.375 g12.5 mL/hr over 240 Minutes Intravenous  Once 02/26/14 2256 02/26/14 2319   02/26/14 2245  ciprofloxacin (CIPRO) IVPB 400 mg  Status:  Discontinued     400 mg200 mL/hr over 60 Minutes Intravenous  Once 02/26/14 2244 02/26/14 2255   02/26/14 2245  metroNIDAZOLE (FLAGYL) IVPB 500 mg  Status:  Discontinued     500 mg100 mL/hr over 60 Minutes Intravenous  Once 02/26/14 2244 02/26/14 2255       Assessment/Plan  1. RLQ inflammatory process, etiology unclear  2. Contained microperforation  Plan: 1. Patient seems to be improving currently on abx therapy.  Will initiate clear liquid diet today. 2. She will need c-scope as outpatient  LOS: 2 days    Leisl Spurrier E 02/28/2014, 12:25 PM Pager: [phone]

## 2014-02-28 NOTE — Progress Notes (Signed)
Patient ID: Kristine Burnett, female   DOB: [birth year], 80 y.o.   MRN: [id number] [facility] PROGRESS NOTE  FEATHER BERRIE CHE:[id number] DOB: January 06, 1930 DOA: 02/26/2014 PCP: Alesia Richards, MD  Brief narrative:    80 y.o. female with history of diabetes mellitus, hypertension and hyperlipidemia who presented to [facility] ED with complaints of lower abdominal pain radiating to bilateral sides for past 2 days prior to this admission. Patient reported some nausea but no vomiting or fevers. On admission, patient was hemodynamically stable. Evaluation included CT abdomen and pelvis which was concerning for bowel perforation. Surgery will see the patient in consultation.  Assessment/Plan:     Principal Problem:  Bowel perforation - CT abdomen and pelvis with contrast showed free fluid and small amount of free air in the abdomen suspicious for bowel perforation. - Appreciate surgery consult for recommendations. - Will continue current IV fluids; NPO  - pain management with dilaudid 1 mg every 4 hours PRN severe pain   Active problems:  Hypokalemia - Secondary to GI losses. Potassium was supplemented through IV fluids.  Essential hypertension - Continue metoprolol 2.5 mg IV every 6 hours since pt is NPO   Diabetes mellitus, controlled with no complications - N3I in 14/4315 6.1 indicating good glycemic control - metformin on hold since pt NPO - continue SSI    DVT Prophylaxis  - SCD's bilaterally   Code Status: Full.  Family Communication: plan of care discussed with the patient Disposition Plan: Home when stable.     IV access:   Peripheral IV  Procedures and diagnostic studies:    Ct Abdomen Pelvis W Contrast 02/26/2014    Free fluid and small amount of free air in the abdomen suspicious for bowel perforation. Cause not identified. Appendix is not visualized but inflammatory changes are present in the right lower quadrant, possibly indicating perforated appendicitis.  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 Vascular compromise to the bowel is not excluded. Surgical absence of the gallbladder with prominent intra and extrahepatic bile duct dilatation. This is likely postoperative but occult noncalcified common duct stone is not excluded.     Medical Consultants:  Surgery  Other Consultants:  None  IAnti-Infectives:   Ertapenem 02/27/2014 -->   Leisa Lenz, MD  [facility] Pager [phone]  If 7PM-7AM, please contact night-coverage [email or web address] Password [facility] 02/28/2014, 9:36 AM   LOS: 2 days    HPI/Subjective: No acute overnight events.  Objective: Filed Vitals:   02/27/14 1338 02/27/14 2049 02/28/14 0013 02/28/14 0618  BP: 119/54 129/54 120/58 120/60  Pulse: 80 83 83 86  Temp: 98.2 F (36.8 C) 98.2 F (36.8 C)  99.2 F (37.3 C)  TempSrc: Oral Oral  Oral  Resp: 16 18  16   Height:      Weight:    71.668 kg (158 lb)  SpO2: 100%   100%    Intake/Output Summary (Last 24 hours) at 02/28/14 0936 Last data filed at 02/28/14 1950  Gross per 24 hour  Intake 1898.33 ml  Output    375 ml  Net 1523.33 ml    Exam:   General:  Pt is alert, not in acute distress  Cardiovascular: Regular rate and rhythm, S1/S2 appreciated   Respiratory: Clear to auscultation bilaterally, no wheezing, no crackles, no rhonchi  Abdomen: tender in lower abdomen, no rebound, bowel sounds present  Extremities: No edema, pulses DP and PT palpable bilaterally  Neuro: Grossly nonfocal  Data Reviewed: Basic  Metabolic Panel:  Recent Labs Lab 02/26/14 2006 02/27/14 0455 02/28/14 0415  NA 133* 131* 135*  K 3.5* 3.6* 4.3  CL 92* 91* 99  CO2 27 26 27   GLUCOSE 135* 132* 88  BUN 14 12 16   CREATININE 0.70 0.72 0.75  CALCIUM 9.4 8.9 8.4   Liver Function Tests:  Recent Labs Lab 02/26/14 2006 02/27/14 0455  AST 60* 46*  ALT 57* 47*  ALKPHOS 225* 177*  BILITOT 0.4 0.4  PROT 7.6 6.9   ALBUMIN 3.5 3.0*   No results for input(s): LIPASE, AMYLASE in the last 168 hours. No results for input(s): AMMONIA in the last 168 hours. CBC:  Recent Labs Lab 02/26/14 2006 02/27/14 0455 02/28/14 0415  WBC 3.6* 5.6 9.7  NEUTROABS 2.7 4.4  --   HGB 13.3 12.7 10.8*  HCT 38.6 38.4 32.1*  MCV 89.6 92.3 90.7  PLT 153 146* 154   Cardiac Enzymes: No results for input(s): CKTOTAL, CKMB, CKMBINDEX, TROPONINI in the last 168 hours. BNP: Invalid input(s): POCBNP CBG:  Recent Labs Lab 02/27/14 1631 02/27/14 2008 02/28/14 0015 02/28/14 0337 02/28/14 0754  GLUCAP 88 90 101* 86 78    No results found for this or any previous visit (from the past 240 hour(s)).   Scheduled Meds: . antiseptic oral rinse  7 mL Mouth Rinse BID  . ertapenem  1 g Intravenous Q24H  . insulin aspart  0-9 Units Subcutaneous Q4H  . metoprolol  2.5 mg Intravenous 4 times per day   Continuous Infusions:

## 2014-03-01 LAB — CBC
HCT: 31.5 % — ABNORMAL LOW (ref 36.0–46.0)
HEMOGLOBIN: 10.2 g/dL — AB (ref 12.0–15.0)
MCH: 30 pg (ref 26.0–34.0)
MCHC: 32.4 g/dL (ref 30.0–36.0)
MCV: 92.6 fL (ref 78.0–100.0)
PLATELETS: 147 10*3/uL — AB (ref 150–400)
RBC: 3.4 MIL/uL — ABNORMAL LOW (ref 3.87–5.11)
RDW: 14.8 % (ref 11.5–15.5)
WBC: 10.1 10*3/uL (ref 4.0–10.5)

## 2014-03-01 LAB — GLUCOSE, CAPILLARY
GLUCOSE-CAPILLARY: 102 mg/dL — AB (ref 70–99)
Glucose-Capillary: 107 mg/dL — ABNORMAL HIGH (ref 70–99)
Glucose-Capillary: 128 mg/dL — ABNORMAL HIGH (ref 70–99)
Glucose-Capillary: 141 mg/dL — ABNORMAL HIGH (ref 70–99)
Glucose-Capillary: 150 mg/dL — ABNORMAL HIGH (ref 70–99)
Glucose-Capillary: 157 mg/dL — ABNORMAL HIGH (ref 70–99)

## 2014-03-01 NOTE — Progress Notes (Signed)
  Subjective: Tolerating clears Small BM last night Feels like she needs to have another large BM - pelvic pressure  Objective: Vital signs in last 24 hours: Temp:  [98.8 F (37.1 C)-99.3 F (37.4 C)] 98.8 F (37.1 C) (12/12 0415) Pulse Rate:  [87-91] 87 (12/12 0415) Resp:  [18-20] 18 (12/12 0415) BP: (122-129)/(59-67) 129/59 mmHg (12/12 0415) SpO2:  [98 %-100 %] 100 % (12/12 0415) Weight:  [165 lb 12.8 oz (75.206 kg)] 165 lb 12.8 oz (75.206 kg) (12/12 0415) Last BM Date: 02/24/14  Intake/Output from previous day: 12/11 0701 - 12/12 0700 In: 2138.3 [P.O.:240; I.V.:1898.3] Out: 150 [Urine:150] Intake/Output this shift: Total I/O In: -  Out: 700 [Urine:700]  General appearance: alert, cooperative and no distress GI: soft, mild suprapubic/ pelvic tenderness; active bowel sounds  Lab Results:   Recent Labs  02/28/14 0415 03/01/14 0548  WBC 9.7 10.1  HGB 10.8* 10.2*  HCT 32.1* 31.5*  PLT 154 147*   BMET  Recent Labs  02/27/14 0455 02/28/14 0415  NA 131* 135*  K 3.6* 4.3  CL 91* 99  CO2 26 27  GLUCOSE 132* 88  BUN 12 16  CREATININE 0.72 0.75  CALCIUM 8.9 8.4   PT/INR No results for input(s): LABPROT, INR in the last 72 hours. ABG No results for input(s): PHART, HCO3 in the last 72 hours.  Invalid input(s): PCO2, PO2  Studies/Results: No results found.  Anti-infectives: Anti-infectives    Start     Dose/Rate Route Frequency Ordered Stop   02/27/14 2200  ertapenem (INVANZ) 1 g in sodium chloride 0.9 % 50 mL IVPB     1 g100 mL/hr over 30 Minutes Intravenous Every 24 hours 02/27/14 0341     02/27/14 0045  ertapenem (INVANZ) 1 g in sodium chloride 0.9 % 50 mL IVPB     1 g100 mL/hr over 30 Minutes Intravenous  Once 02/27/14 0031 02/27/14 0134   02/26/14 2300  piperacillin-tazobactam (ZOSYN) IVPB 3.375 g  Status:  Discontinued     3.375 g12.5 mL/hr over 240 Minutes Intravenous  Once 02/26/14 2256 02/26/14 2319   02/26/14 2245  ciprofloxacin (CIPRO) IVPB  400 mg  Status:  Discontinued     400 mg200 mL/hr over 60 Minutes Intravenous  Once 02/26/14 2244 02/26/14 2255   02/26/14 2245  metroNIDAZOLE (FLAGYL) IVPB 500 mg  Status:  Discontinued     500 mg100 mL/hr over 60 Minutes Intravenous  Once 02/26/14 2244 02/26/14 2255      Assessment/Plan: s/p * No surgery found * RLQ inflammatory process - contained microperforation   Seems to be improving with abx - conservative management Continue clears until pain is improved.  LOS: 3 days    Kristine Burnett K. 03/01/2014

## 2014-03-01 NOTE — Progress Notes (Signed)
Patient ID: Kristine Burnett, female   DOB: 09/03/1929, 80 y.o.   MRN: [id number] [facility] PROGRESS NOTE  KHOLE ARTERBURN [id number] DOB: [birth year] DOA: 02/26/2014 PCP: Alesia Richards, MD  Brief narrative:    80 y.o. female with history of diabetes mellitus, hypertension and hyperlipidemia who presented to [facility] ED with complaints of lower abdominal pain radiating to bilateral sides for past 2 days prior to this admission. Patient reported some nausea but no vomiting or fevers. On admission, patient was hemodynamically stable. Evaluation included CT abdomen and pelvis which was concerning for bowel perforation. So far, surgery not indicated since patient is improving with current management which includes IVF, abx.   Assessment/Plan:     Principal Problem:  Bowel perforation - CT abdomen and pelvis with contrast showed free fluid and small amount of free air in the abdomen suspicious for bowel perforation. - Appreciate surgery consult for recommendations. For now, supportive care since patient improving. Continue IVF and antibiotics, ertapenem. Analgesia and antiemetics as needed.   Active problems:  Hypokalemia - Secondary to GI losses. Potassium was supplemented through IV fluids. Potassium is 4.3.  Essential hypertension - Continue metoprolol 2.5 mg IV every 6 hours.  - BP 142/69.  Diabetes mellitus, controlled with no complications - U4Q in 05/4740 6.1 indicating good glycemic control - metformin on hold until pt tolerates PO intake  - continue SSI   DVT Prophylaxis  - SCD's bilaterally   Code Status: Full.  Family Communication: plan of care discussed with the patient Disposition Plan: Home when stable.     IV access:   Peripheral IV  Procedures and diagnostic studies:   Ct Abdomen Pelvis W Contrast 02/26/2014 Free fluid and small amount of free air in the abdomen suspicious for bowel perforation. Cause not identified. Appendix is not  visualized but inflammatory changes are present in the right lower quadrant, possibly indicating perforated appendicitis. 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 Vascular compromise to the bowel is not excluded. Surgical absence of the gallbladder with prominent intra and extrahepatic bile duct dilatation. This is likely postoperative but occult noncalcified common duct stone is not excluded.   Medical Consultants:  Surgery  Other Consultants:  None  IAnti-Infectives:   Ertapenem 02/27/2014 -->    Leisa Lenz, MD  [facility] Pager [phone]  If 7PM-7AM, please contact night-coverage [email or web address] Password [facility] 03/01/2014, 4:51 PM   LOS: 3 days    HPI/Subjective: No acute overnight events.  Objective: Filed Vitals:   02/28/14 0618 02/28/14 2011 03/01/14 0415 03/01/14 1500  BP: 120/60 122/67 129/59 142/69  Pulse: 86 91 87 87  Temp: 99.2 F (37.3 C) 99.3 F (37.4 C) 98.8 F (37.1 C) 97.6 F (36.4 C)  TempSrc: Oral Oral Oral Oral  Resp: 16 20 18 16   Height:      Weight: 71.668 kg (158 lb)  75.206 kg (165 lb 12.8 oz)   SpO2: 100% 98% 100% 100%    Intake/Output Summary (Last 24 hours) at 03/01/14 1651 Last data filed at 03/01/14 1319  Gross per 24 hour  Intake    840 ml  Output    850 ml  Net    -10 ml    Exam:   General:  Pt is alert, follows commands appropriately, not in acute distress  Cardiovascular: Regular rate and rhythm, S1/S2, no murmurs  Respiratory: Clear to auscultation bilaterally, no wheezing, no crackles, no rhonchi  Abdomen: tender in lower abdomen, non  distended, bowel sounds present   Data Reviewed: Basic Metabolic Panel:  Recent Labs Lab 02/26/14 2006 02/27/14 0455 02/28/14 0415  NA 133* 131* 135*  K 3.5* 3.6* 4.3  CL 92* 91* 99  CO2 27 26 27   GLUCOSE 135* 132* 88  BUN 14 12 16   CREATININE 0.70 0.72 0.75  CALCIUM 9.4 8.9 8.4   Liver Function  Tests:  Recent Labs Lab 02/26/14 2006 02/27/14 0455  AST 60* 46*  ALT 57* 47*  ALKPHOS 225* 177*  BILITOT 0.4 0.4  PROT 7.6 6.9  ALBUMIN 3.5 3.0*   No results for input(s): LIPASE, AMYLASE in the last 168 hours. No results for input(s): AMMONIA in the last 168 hours. CBC:  Recent Labs Lab 02/26/14 2006 02/27/14 0455 02/28/14 0415 03/01/14 0548  WBC 3.6* 5.6 9.7 10.1  NEUTROABS 2.7 4.4  --   --   HGB 13.3 12.7 10.8* 10.2*  HCT 38.6 38.4 32.1* 31.5*  MCV 89.6 92.3 90.7 92.6  PLT 153 146* 154 147*   Cardiac Enzymes: No results for input(s): CKTOTAL, CKMB, CKMBINDEX, TROPONINI in the last 168 hours. BNP: Invalid input(s): POCBNP CBG:  Recent Labs Lab 02/28/14 2357 03/01/14 0410 03/01/14 0748 03/01/14 1153 03/01/14 1615  GLUCAP 141* 107* 102* 150* 157*    No results found for this or any previous visit (from the past 240 hour(s)).   Scheduled Meds: . antiseptic oral rinse  7 mL Mouth Rinse BID  . ertapenem  1 g Intravenous Q24H  . insulin aspart  0-9 Units Subcutaneous Q4H  . metoprolol  2.5 mg Intravenous 4 times per day   Continuous Infusions:

## 2014-03-01 NOTE — Progress Notes (Signed)
PT Cancellation Note  Patient Details Name: Kristine Burnett MRN: [id number] DOB: 25-Sep-1929   Cancelled Treatment:    Reason Eval/Treat Not Completed: Other (comment) (pt refused due to just having walked with nursing. pt requests PT return later)   [facility] 03/01/2014, 11:09 AM

## 2014-03-02 LAB — GLUCOSE, CAPILLARY
GLUCOSE-CAPILLARY: 107 mg/dL — AB (ref 70–99)
GLUCOSE-CAPILLARY: 143 mg/dL — AB (ref 70–99)
GLUCOSE-CAPILLARY: 113 mg/dL — AB (ref 70–99)
Glucose-Capillary: 103 mg/dL — ABNORMAL HIGH (ref 70–99)
Glucose-Capillary: 105 mg/dL — ABNORMAL HIGH (ref 70–99)

## 2014-03-02 MED ORDER — INSULIN ASPART 100 UNIT/ML ~~LOC~~ SOLN
0.0000 [IU] | Freq: Three times a day (TID) | SUBCUTANEOUS | Status: DC
  Administered 2014-03-03 – 2014-03-04 (×2): 1 [IU] via SUBCUTANEOUS
  Administered 2014-03-04: 2 [IU] via SUBCUTANEOUS
  Administered 2014-03-05 – 2014-03-06 (×3): 1 [IU] via SUBCUTANEOUS

## 2014-03-02 NOTE — Evaluation (Signed)
Physical Therapy Evaluation Patient Details Name: Kristine Burnett MRN: [id number] DOB: Oct 13, 1929 Today's Date: 03/02/2014   History of Present Illness    80 y.o. female with history of diabetes mellitus, hypertension and hyperlipidemia who presented to [facility] ED with complaints of lower abdominal pain radiating to bilateral sides for past 2 days prior to this admission. Patient reported some nausea but no vomiting or fevers. On admission, patient was hemodynamically stable. Evaluation included CT abdomen and pelvis which was concerning for bowel perforation. So far, surgery not indicated   Clinical Impression  Pt admitted with abd pain and currently mobilizing with min assist for stability.  Pt should progress to Mod I with RW and plans d.c home with intermittent assist of family.    Follow Up Recommendations Home health PT;No PT follow up    Equipment Recommendations  None recommended by PT (dependent on acute stay progress)    Recommendations for Other Services OT consult     Precautions / Restrictions Precautions Precautions: Fall Required Braces or Orthoses: Other Brace/Splint Other Brace/Splint:  (Put shoes on to ambulate) Restrictions Weight Bearing Restrictions: No      Mobility  Bed Mobility               General bed mobility comments: Pt OOB wtih nursing - requests return to chair  Transfers Overall transfer level: Needs assistance Equipment used: Rolling walker (2 wheeled) Transfers: Sit to/from Stand Sit to Stand: Min assist Stand pivot transfers: Min assist (chair<>BSC)       General transfer comment: cues for transition position and use of UEs   Ambulation/Gait Ambulation/Gait assistance: Min guard Ambulation Distance (Feet): 350 Feet Assistive device: Rolling walker (2 wheeled) Gait Pattern/deviations: Step-through pattern;Decreased step length - right;Decreased step length - left;Shuffle;Trunk flexed Gait velocity: decr   General Gait Details: cues  for position from [employer] Mobility    Modified Rankin (Stroke Patients Only)       Balance                                             Pertinent Vitals/Pain Pain Assessment: No/denies pain    Home Living Family/patient expects to be discharged to:: Private residence Living Arrangements: Alone Available Help at Discharge: Family;Available PRN/intermittently Type of Home: House Home Access: Stairs to enter Entrance Stairs-Rails: Right Entrance Stairs-Number of Steps: 2 Home Layout: One level Home Equipment: Walker - 2 wheels;Cane - quad Additional Comments: uses RW x ~6 months 2* spinal stenosis    Prior Function Level of Independence: Independent with assistive device(s)         Comments: Pt was driving and shopping at [employer] on own     Hand Dominance        Extremity/Trunk Assessment   Upper Extremity Assessment: Overall WFL for tasks assessed           Lower Extremity Assessment: RLE deficits/detail RLE Deficits / Details: decreased DF - pt states has been same for many yrs    Cervical / Trunk Assessment: Kyphotic  Communication   Communication: No difficulties  Cognition Arousal/Alertness: Awake/alert Behavior During Therapy: WFL for tasks assessed/performed Overall Cognitive Status: Within Functional Limits for tasks assessed  General Comments      Exercises        Assessment/Plan    PT Assessment Patient needs continued PT services  PT Diagnosis Difficulty walking   PT Problem List Decreased strength;Decreased activity tolerance;Decreased mobility;Decreased balance;Decreased knowledge of use of DME  PT Treatment Interventions DME instruction;Gait training;Stair training;Functional mobility training;Therapeutic activities;Therapeutic exercise;Patient/family education   PT Goals (Current goals can be found in the Care Plan section) Acute Rehab PT  Goals Patient Stated Goal: Home PT Goal Formulation: With patient Time For Goal Achievement: 03/16/14 Potential to Achieve Goals: Good    Frequency Min 4X/week   Barriers to discharge        Co-evaluation               End of Session Equipment Utilized During Treatment: Gait belt;Oxygen Activity Tolerance: Patient tolerated treatment well Patient left: in chair;with call bell/phone within reach;with family/visitor present Nurse Communication: Mobility status         Time: 4037-5436 PT Time Calculation (min) (ACUTE ONLY): 45 min   Charges:   PT Evaluation $Initial PT Evaluation Tier I: 1 Procedure PT Treatments $Gait Training: 8-22 mins $Therapeutic Activity: 8-22 mins   PT G Codes:          Lurline Caver 03/02/2014, 2:52 PM

## 2014-03-02 NOTE — Progress Notes (Signed)
Patient ID: Kristine Burnett, female   DOB: 27-Feb-1930, 80 y.o.   MRN: [id number]  General Surgery - [facility] - Progress Note  Subjective: Patient up in room, going to bathroom.  Multiple BM's.  Family at bedside.  Less pain.  Objective: Vital signs in last 24 hours: Temp:  [97.6 F (36.4 C)-98.8 F (37.1 C)] 98.8 F (37.1 C) (12/13 0513) Pulse Rate:  [84-87] 84 (12/13 0513) Resp:  [16-18] 18 (12/13 0513) BP: (135-142)/(68-73) 135/68 mmHg (12/13 0513) SpO2:  [100 %] 100 % (12/13 0513) Weight:  [164 lb 3.2 oz (74.481 kg)] 164 lb 3.2 oz (74.481 kg) (12/13 0513) Last BM Date: 03/01/14  Intake/Output from previous day: 12/12 0701 - 12/13 0700 In: 1080 [P.O.:1080] Out: 700 [Urine:700]  Exam: HEENT - clear, not icteric Neck - soft Chest - clear bilaterally Cor - RRR, no murmur Abd - soft without distension; mild tenderness mid right abdomen; BS present Ext - no significant edema Neuro - grossly intact, no focal deficits  Lab Results:   Recent Labs  02/28/14 0415 03/01/14 0548  WBC 9.7 10.1  HGB 10.8* 10.2*  HCT 32.1* 31.5*  PLT 154 147*     Recent Labs  02/28/14 0415  NA 135*  K 4.3  CL 99  CO2 27  GLUCOSE 88  BUN 16  CREATININE 0.75  CALCIUM 8.4    Studies/Results: No results found.  Assessment / Plan: 1.  Probable acute diverticulitis with small perforation  IV Invanz  Clear liquid diet  WBC stable at 10K  Clinically improving - will follow - stay on clears today  Earnstine Regal, MD, [facility] Office: [phone]  03/02/2014

## 2014-03-02 NOTE — Progress Notes (Signed)
Patient ID: Kristine Burnett, female   DOB: 1929-08-25, 80 y.o.   MRN: [id number] [facility] PROGRESS NOTE  RHONA FUSILIER URK:[id number] DOB: 1929-10-18 DOA: 02/26/2014 PCP: Alesia Richards, MD  Brief narrative:    80 y.o. female with history of diabetes mellitus, hypertension and hyperlipidemia who presented to [facility] ED with complaints of lower abdominal pain radiating to bilateral sides for past 2 days prior to this admission. Patient reported some nausea but no vomiting or fevers. On admission, patient was hemodynamically stable. Evaluation included CT abdomen and pelvis which was concerning for bowel perforation. So far, surgery not indicated since patient is improving with current management which includes IVF, abx.   Assessment/Plan:     Principal Problem:  Bowel perforation - CT abdomen and pelvis with contrast showed free fluid and small amount of free air in the abdomen suspicious for bowel perforation. - Appreciate surgery following - pt improving with antibiotic, IV fluids, analgesia asa needed - tolerates clear liquids so far; will be on CLD today per surgery    Active problems:  Hypokalemia - Secondary to GI losses. Potassium was supplemented through IV fluids. Potassium is 4.3.  Essential hypertension - Continue metoprolol 2.5 mg IV every 6 hours.   Diabetes mellitus, controlled with no complications - G3T in 51/7616 6.1 indicating good glycemic control - metformin on hold  - continue SSI   DVT Prophylaxis  - SCD's bilaterally   Code Status: Full.  Family Communication: plan of care discussed with the patient and her daughter at the bedside  Disposition Plan: Home when stable.     IV access:   Peripheral IV  Procedures and diagnostic studies:   Ct Abdomen Pelvis W Contrast 02/26/2014 Free fluid and small amount of free air in the abdomen suspicious for bowel perforation. Cause not identified. Appendix is not visualized but  inflammatory changes are present in the right lower quadrant, possibly indicating perforated appendicitis. 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 Vascular compromise to the bowel is not excluded. Surgical absence of the gallbladder with prominent intra and extrahepatic bile duct dilatation. This is likely postoperative but occult noncalcified common duct stone is not excluded.   Medical Consultants:  Surgery  Other Consultants:  None  IAnti-Infectives:   Ertapenem 02/27/2014 -->   Leisa Lenz, MD  [facility] Pager [phone]  If 7PM-7AM, please contact night-coverage [email or web address] Password [facility] 03/02/2014, 12:31 PM   LOS: 4 days    HPI/Subjective: No acute overnight events.  Objective: Filed Vitals:   03/01/14 0415 03/01/14 1500 03/01/14 2111 03/02/14 0513  BP: 129/59 142/69 139/73 135/68  Pulse: 87 87 84 84  Temp: 98.8 F (37.1 C) 97.6 F (36.4 C) 97.7 F (36.5 C) 98.8 F (37.1 C)  TempSrc: Oral Oral Oral Oral  Resp: 18 16 18 18   Height:      Weight: 75.206 kg (165 lb 12.8 oz)   74.481 kg (164 lb 3.2 oz)  SpO2: 100% 100% 100% 100%    Intake/Output Summary (Last 24 hours) at 03/02/14 1231 Last data filed at 03/02/14 1030  Gross per 24 hour  Intake   1680 ml  Output      0 ml  Net   1680 ml    Exam:   General:  Pt is alert, follows commands appropriately, not in acute distress  Cardiovascular: Regular rate and rhythm, S1/S2, no murmurs  Respiratory: Clear to auscultation bilaterally, no wheezing, no crackles, no rhonchi  Abdomen:  Soft, non tender, non distended, bowel sounds present  Extremities: No edema, pulses DP and PT palpable bilaterally   Data Reviewed: Basic Metabolic Panel:  Recent Labs Lab 02/26/14 2006 02/27/14 0455 02/28/14 0415  NA 133* 131* 135*  K 3.5* 3.6* 4.3  CL 92* 91* 99  CO2 27 26 27   GLUCOSE 135* 132* 88  BUN 14 12 16   CREATININE 0.70 0.72  0.75  CALCIUM 9.4 8.9 8.4   Liver Function Tests:  Recent Labs Lab 02/26/14 2006 02/27/14 0455  AST 60* 46*  ALT 57* 47*  ALKPHOS 225* 177*  BILITOT 0.4 0.4  PROT 7.6 6.9  ALBUMIN 3.5 3.0*   No results for input(s): LIPASE, AMYLASE in the last 168 hours. No results for input(s): AMMONIA in the last 168 hours. CBC:  Recent Labs Lab 02/26/14 2006 02/27/14 0455 02/28/14 0415 03/01/14 0548  WBC 3.6* 5.6 9.7 10.1  NEUTROABS 2.7 4.4  --   --   HGB 13.3 12.7 10.8* 10.2*  HCT 38.6 38.4 32.1* 31.5*  MCV 89.6 92.3 90.7 92.6  PLT 153 146* 154 147*   Cardiac Enzymes: No results for input(s): CKTOTAL, CKMB, CKMBINDEX, TROPONINI in the last 168 hours. BNP: Invalid input(s): POCBNP CBG:  Recent Labs Lab 03/01/14 2019 03/02/14 0011 03/02/14 0401 03/02/14 0729 03/02/14 1135  GLUCAP 128* 103* 105* 107* 143*    No results found for this or any previous visit (from the past 240 hour(s)).   Scheduled Meds: . antiseptic oral rinse  7 mL Mouth Rinse BID  . ertapenem  1 g Intravenous Q24H  . insulin aspart  0-9 Units Subcutaneous Q4H  . metoprolol  2.5 mg Intravenous 4 times per day   Continuous Infusions:

## 2014-03-03 LAB — BASIC METABOLIC PANEL
Anion gap: 7 (ref 5–15)
BUN: 7 mg/dL (ref 6–23)
CO2: 28 mEq/L (ref 19–32)
Calcium: 8.7 mg/dL (ref 8.4–10.5)
Chloride: 102 mEq/L (ref 96–112)
Creatinine, Ser: 0.58 mg/dL (ref 0.50–1.10)
GFR calc Af Amer: >90 mL/min (ref >90)
GFR calc non Af Amer: 84 mL/min — ABNORMAL LOW (ref >90)
Glucose, Bld: 109 mg/dL — ABNORMAL HIGH (ref 70–99)
Potassium: 4.7 mEq/L (ref 3.7–5.3)
SODIUM: 137 meq/L (ref 137–147)

## 2014-03-03 LAB — CBC
HEMATOCRIT: 31.9 % — AB (ref 36.0–46.0)
Hemoglobin: 10.4 g/dL — ABNORMAL LOW (ref 12.0–15.0)
MCH: 29.8 pg (ref 26.0–34.0)
MCHC: 32.6 g/dL (ref 30.0–36.0)
MCV: 91.4 fL (ref 78.0–100.0)
Platelets: 160 10*3/uL (ref 150–400)
RBC: 3.49 MIL/uL — ABNORMAL LOW (ref 3.87–5.11)
RDW: 14.9 % (ref 11.5–15.5)
WBC: 9.1 10*3/uL (ref 4.0–10.5)

## 2014-03-03 LAB — GLUCOSE, CAPILLARY
GLUCOSE-CAPILLARY: 149 mg/dL — AB (ref 70–99)
Glucose-Capillary: 103 mg/dL — ABNORMAL HIGH (ref 70–99)
Glucose-Capillary: 139 mg/dL — ABNORMAL HIGH (ref 70–99)

## 2014-03-03 NOTE — Progress Notes (Signed)
Patient ID: Kristine Burnett, female   DOB: 1929-10-05, 80 y.o.   MRN: [id number] [facility] PROGRESS NOTE  Kristine Burnett EZM:[id number] DOB: 1929-12-16 DOA: 02/26/2014 PCP: Alesia Richards, MD  Brief narrative:    80 y.o. female with history of diabetes mellitus, hypertension and hyperlipidemia who presented to [facility] ED with complaints of lower abdominal pain radiating to bilateral sides for past 2 days prior to this admission. Patient reported some nausea but no vomiting or fevers. On admission, patient was hemodynamically stable. Evaluation included CT abdomen and pelvis which was concerning for bowel perforation. So far, surgery not indicated since patient is improving with current management which includes IVF, abx.  Assessment/Plan:     Principal Problem:  Bowel perforation - CT abdomen and pelvis with contrast showed free fluid and small amount of free air in the abdomen suspicious for bowel perforation. - Appreciate surgery following - pt improving with antibiotic, IV fluids, analgesia asa needed - Diet will be advanced full liquids today per surgery  Active problems:  Hypokalemia - Secondary to GI losses. Potassium was supplemented through IV fluids. Potassium is 4.3.  Essential hypertension - Continue metoprolol 2.5 mg IV every 6 hours.   Diabetes mellitus, controlled with no complications - T0P in 54/6568 6.1 indicating good glycemic control - metformin on hold  - continue SSI   DVT Prophylaxis  - SCD's bilaterally   Code Status: Full.  Family Communication: plan of care discussed with the patient and her daughter at the bedside  Disposition Plan: Home when stable.    IV access:   Peripheral IV  Procedures and diagnostic studies:   Ct Abdomen Pelvis W Contrast 02/26/2014 Free fluid and small amount of free air in the abdomen suspicious for bowel perforation. Cause not identified. Appendix is not visualized but inflammatory changes  are present in the right lower quadrant, possibly indicating perforated appendicitis. 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 Vascular compromise to the bowel is not excluded. Surgical absence of the gallbladder with prominent intra and extrahepatic bile duct dilatation. This is likely postoperative but occult noncalcified common duct stone is not excluded.   Medical Consultants:  Surgery  Other Consultants:  None  IAnti-Infectives:   Ertapenem 02/27/2014 -->   Leisa Lenz, MD  [facility] Pager [phone]  If 7PM-7AM, please contact night-coverage [email or web address] Password [email or web address] 03/03/2014, 3:10 PM   LOS: 5 days    HPI/Subjective: No acute overnight events.  Objective: Filed Vitals:   03/02/14 1335 03/02/14 2107 03/03/14 0503 03/03/14 1509  BP: 136/82 136/58 135/68 143/72  Pulse: 85 93 90 86  Temp: 98.1 F (36.7 C) 98.6 F (37 C) 98.4 F (36.9 C)   TempSrc: Oral Oral Oral   Resp: 18 18 16 18   Height:      Weight:      SpO2: 98% 99% 100% 100%   No intake or output data in the 24 hours ending 03/03/14 1510  Exam:   General:  Pt is alert, follows commands appropriately, not in acute distress  Cardiovascular: Regular rate and rhythm, S1/S2, no murmurs  Respiratory: Clear to auscultation bilaterally, no wheezing, no crackles, no rhonchi  Abdomen: Soft, non tender, non distended, bowel sounds present  Extremities: No edema, pulses DP and PT palpable bilaterally  Neuro: Grossly nonfocal  Data Reviewed: Basic Metabolic Panel:  Recent Labs Lab 02/26/14 2006 02/27/14 0455 02/28/14 0415 03/03/14 0440  NA 133* 131* 135* 137  K 3.5* 3.6* 4.3  4.7  CL 92* 91* 99 102  CO2 27 26 27 28   GLUCOSE 135* 132* 88 109*  BUN 14 12 16 7   CREATININE 0.70 0.72 0.75 0.58  CALCIUM 9.4 8.9 8.4 8.7   Liver Function Tests:  Recent Labs Lab 02/26/14 2006 02/27/14 0455  AST 60* 46*  ALT 57* 47*   ALKPHOS 225* 177*  BILITOT 0.4 0.4  PROT 7.6 6.9  ALBUMIN 3.5 3.0*   No results for input(s): LIPASE, AMYLASE in the last 168 hours. No results for input(s): AMMONIA in the last 168 hours. CBC:  Recent Labs Lab 02/26/14 2006 02/27/14 0455 02/28/14 0415 03/01/14 0548 03/03/14 0440  WBC 3.6* 5.6 9.7 10.1 9.1  NEUTROABS 2.7 4.4  --   --   --   HGB 13.3 12.7 10.8* 10.2* 10.4*  HCT 38.6 38.4 32.1* 31.5* 31.9*  MCV 89.6 92.3 90.7 92.6 91.4  PLT 153 146* 154 147* 160   Cardiac Enzymes: No results for input(s): CKTOTAL, CKMB, CKMBINDEX, TROPONINI in the last 168 hours. BNP: Invalid input(s): POCBNP CBG:  Recent Labs Lab 03/02/14 0729 03/02/14 1135 03/02/14 1637 03/03/14 0808 03/03/14 1228  GLUCAP 107* 143* 113* 103* 149*    No results found for this or any previous visit (from the past 240 hour(s)).   Scheduled Meds: . antiseptic oral rinse  7 mL Mouth Rinse BID  . ertapenem  1 g Intravenous Q24H  . insulin aspart  0-9 Units Subcutaneous TID WC  . metoprolol  2.5 mg Intravenous 4 times per day   Continuous Infusions:

## 2014-03-03 NOTE — Progress Notes (Signed)
  Subjective: No significant abdominal pain.  Tolerating clear liquids.  Objective: Vital signs in last 24 hours: Temp:  [98.1 F (36.7 C)-98.6 F (37 C)] 98.4 F (36.9 C) (12/14 0503) Pulse Rate:  [85-93] 90 (12/14 0503) Resp:  [16-18] 16 (12/14 0503) BP: (135-136)/(58-82) 135/68 mmHg (12/14 0503) SpO2:  [98 %-100 %] 100 % (12/14 0503) Last BM Date: 03/01/14  Intake/Output from previous day: 12/13 0701 - 12/14 0700 In: 1680 [P.O.:1680] Out: -  Intake/Output this shift:    PE: General- In NAD Abdomen-soft, no lower abdominal tenderness  Lab Results:   Recent Labs  03/01/14 0548 03/03/14 0440  WBC 10.1 9.1  HGB 10.2* 10.4*  HCT 31.5* 31.9*  PLT 147* 160   BMET  Recent Labs  03/03/14 0440  NA 137  K 4.7  CL 102  CO2 28  GLUCOSE 109*  BUN 7  CREATININE 0.58  CALCIUM 8.7   PT/INR No results for input(s): LABPROT, INR in the last 72 hours. Comprehensive Metabolic Panel:    Component Value Date/Time   NA 137 03/03/2014 0440   NA 135* 02/28/2014 0415   K 4.7 03/03/2014 0440   K 4.3 02/28/2014 0415   CL 102 03/03/2014 0440   CL 99 02/28/2014 0415   CO2 28 03/03/2014 0440   CO2 27 02/28/2014 0415   BUN 7 03/03/2014 0440   BUN 16 02/28/2014 0415   CREATININE 0.58 03/03/2014 0440   CREATININE 0.75 02/28/2014 0415   CREATININE 0.90 12/25/2013 1111   CREATININE 0.95 09/17/2013 1657   GLUCOSE 109* 03/03/2014 0440   GLUCOSE 88 02/28/2014 0415   CALCIUM 8.7 03/03/2014 0440   CALCIUM 8.4 02/28/2014 0415   AST 46* 02/27/2014 0455   AST 60* 02/26/2014 2006   ALT 47* 02/27/2014 0455   ALT 57* 02/26/2014 2006   ALKPHOS 177* 02/27/2014 0455   ALKPHOS 225* 02/26/2014 2006   BILITOT 0.4 02/27/2014 0455   BILITOT 0.4 02/26/2014 2006   PROT 6.9 02/27/2014 0455   PROT 7.6 02/26/2014 2006   ALBUMIN 3.0* 02/27/2014 0455   ALBUMIN 3.5 02/26/2014 2006     Studies/Results: No results found.  Anti-infectives: Anti-infectives    Start     Dose/Rate Route  Frequency Ordered Stop   02/27/14 2200  ertapenem (INVANZ) 1 g in sodium chloride 0.9 % 50 mL IVPB     1 g100 mL/hr over 30 Minutes Intravenous Every 24 hours 02/27/14 0341     02/27/14 0045  ertapenem (INVANZ) 1 g in sodium chloride 0.9 % 50 mL IVPB     1 g100 mL/hr over 30 Minutes Intravenous  Once 02/27/14 0031 02/27/14 0134   02/26/14 2300  piperacillin-tazobactam (ZOSYN) IVPB 3.375 g  Status:  Discontinued     3.375 g12.5 mL/hr over 240 Minutes Intravenous  Once 02/26/14 2256 02/26/14 2319   02/26/14 2245  ciprofloxacin (CIPRO) IVPB 400 mg  Status:  Discontinued     400 mg200 mL/hr over 60 Minutes Intravenous  Once 02/26/14 2244 02/26/14 2255   02/26/14 2245  metroNIDAZOLE (FLAGYL) IVPB 500 mg  Status:  Discontinued     500 mg100 mL/hr over 60 Minutes Intravenous  Once 02/26/14 2244 02/26/14 2255      Assessment Microperforation of bowel possibly secondary to appendicitis or diverticulitis-on IV InVanz, clinically improving; WBC normal   LOS: 5 days   Plan: Advance to full liquids.  Continue IV InVanz.   Kristine Burnett 03/03/2014

## 2014-03-03 NOTE — Progress Notes (Signed)
   03/03/14 1000  PT Visit Information  Last PT Received On 03/03/14  Assistance Needed +1  Reason Eval/Treat Not Completed Fatigue/lethargy limiting ability to participate (pt states she is "too sleepy")  PT - Assessment/Plan  PT Plan Frequency needs to be updated---pt has refused 2 of last 3 attempts  PT Frequency (ACUTE ONLY) Min 3X/week  Follow Up Recommendations Home health PT;No PT follow up

## 2014-03-04 LAB — GLUCOSE, CAPILLARY
GLUCOSE-CAPILLARY: 183 mg/dL — AB (ref 70–99)
Glucose-Capillary: 94 mg/dL (ref 70–99)
Glucose-Capillary: 127 mg/dL — ABNORMAL HIGH (ref 70–99)

## 2014-03-04 MED ORDER — METFORMIN HCL 500 MG PO TABS
500.0000 mg | ORAL_TABLET | Freq: Two times a day (BID) | ORAL | Status: DC
  Administered 2014-03-04 – 2014-03-09 (×10): 500 mg via ORAL
  Filled 2014-03-04 (×13): qty 1

## 2014-03-04 MED ORDER — ATENOLOL 100 MG PO TABS
100.0000 mg | ORAL_TABLET | Freq: Every day | ORAL | Status: DC
  Administered 2014-03-04 – 2014-03-09 (×6): 100 mg via ORAL
  Filled 2014-03-04 (×6): qty 1

## 2014-03-04 MED ORDER — DILTIAZEM HCL 60 MG PO TABS
120.0000 mg | ORAL_TABLET | Freq: Two times a day (BID) | ORAL | Status: DC
  Administered 2014-03-04 – 2014-03-09 (×11): 120 mg via ORAL
  Filled 2014-03-04 (×12): qty 2

## 2014-03-04 NOTE — Progress Notes (Signed)
Subjective: She says she has no pain.  Enjoyed the Full liquids. No BM.  Objective: Vital signs in last 24 hours: Temp:  [98.4 F (36.9 C)-99.2 F (37.3 C)] 99.2 F (37.3 C) (12/15 0504) Pulse Rate:  [86-102] 96 (12/15 0504) Resp:  [18] 18 (12/15 0504) BP: (135-165)/(57-87) 165/87 mmHg (12/15 0504) SpO2:  [100 %] 100 % (12/15 0504) Weight:  [71.124 kg (156 lb 12.8 oz)] 71.124 kg (156 lb 12.8 oz) (12/15 0504) Last BM Date: 03/03/14 Diet:  Full liquids Afebrile, VSS, HR up some and 1 elevated diastolic BP recorded. No labs today CT 12/9  Admit 12/10 Intake/Output from previous day:   Intake/Output this shift:    General appearance: alert, cooperative, no distress and On O2. GI: soft, non-tender; bowel sounds normal; no masses,  no organomegaly  Lab Results:   Recent Labs  03/03/14 0440  WBC 9.1  HGB 10.4*  HCT 31.9*  PLT 160    BMET  Recent Labs  03/03/14 0440  NA 137  K 4.7  CL 102  CO2 28  GLUCOSE 109*  BUN 7  CREATININE 0.58  CALCIUM 8.7   PT/INR No results for input(s): LABPROT, INR in the last 72 hours.   Recent Labs Lab 02/26/14 2006 02/27/14 0455  AST 60* 46*  ALT 57* 47*  ALKPHOS 225* 177*  BILITOT 0.4 0.4  PROT 7.6 6.9  ALBUMIN 3.5 3.0*     Lipase     Component Value Date/Time   LIPASE 17 12/30/2008 0003     Studies/Results: No results found.  Medications: . antiseptic oral rinse  7 mL Mouth Rinse BID  . ertapenem  1 g Intravenous Q24H  . insulin aspart  0-9 Units Subcutaneous TID WC  . metoprolol  2.5 mg Intravenous 4 times per day     Prior to Admission medications   Medication Sig Start Date End Date Taking? Authorizing Provider  aspirin 325 MG tablet Take 325 mg by mouth daily.   Yes Historical Provider, MD  atenolol (TENORMIN) 100 MG tablet TAKE 1 TABLET BY MOUTH DAILY FOR BLOOD PRESSURE 12/02/13  Yes Unk Pinto, MD  Cholecalciferol (VITAMIN D-3) 1000 UNITS CAPS Take 4,000 Units by mouth daily.   Yes Historical  Provider, MD  Cyanocobalamin (VITAMIN B-12) 2500 MCG SUBL Place 1 tablet under the tongue once a week.    Yes Historical Provider, MD  diltiazem (CARDIZEM) 120 MG tablet TAKE 1 TABLET BY MOUTH TWICE DAILY FOR HIGH BLOOD PRESSURE 08/03/13  Yes Vicie Mutters, PA-C  enalapril (VASOTEC) 20 MG tablet TAKE 1 TABLET BY MOUTH TWICE DAILY 01/28/14  Yes Unk Pinto, MD  Flaxseed, Linseed, (FLAXSEED OIL) 1000 MG CAPS Take 1,000 mg by mouth 4 (four) times daily.   Yes Historical Provider, MD  hydrochlorothiazide (HYDRODIURIL) 25 MG tablet TAKE 1 TABLET BY MOUTH EVERY MORNING 12/02/13  Yes Unk Pinto, MD  Magnesium Oxide (MAG-OX PO) Take 500 mg by mouth 2 (two) times daily.   Yes Historical Provider, MD  metFORMIN (GLUCOPHAGE) 500 MG tablet Take 1 tablet (500 mg total) by mouth 2 (two) times daily with a meal. 06/27/13 06/27/14 Yes Melissa R Smith, PA-C  Multiple Vitamins-Minerals (MULTIVITAMIN & MINERAL PO) Take by mouth daily.   Yes Historical Provider, MD  naproxen sodium (ANAPROX) 220 MG tablet Take 220 mg by mouth 2 (two) times daily with a meal.   Yes Historical Provider, MD  oxybutynin (DITROPAN) 5 MG tablet TAKE 1 TABLET BY MOUTH TWICE DAILY FOR BLADDER 01/28/14  Yes Unk Pinto, MD  simvastatin (ZOCOR) 40 MG tablet TAKE 1 TABLET BY MOUTH EVERY NIGHT AT BEDTIME FOR CHOLESTEROL 08/03/13  Yes Vicie Mutters, PA-C  alendronate (FOSAMAX) 70 MG tablet Take 1 tablet (70 mg total) by mouth once a week. Take with a full glass of water on an empty stomach. 10/29/13   Unk Pinto, MD  baclofen (LIORESAL) 10 MG tablet TAKE 1/2 TO 1 TABLET BY MOUTH THREE TIMES DAILY AS NEEDED FOR MUSCLE SPASM    Melissa R Smith, PA-C  baclofen (LIORESAL) 10 MG tablet TAKE 1/2 TO 1 TABLET BY MOUTH THREE TIMES DAILY AS NEEDED FOR MUSCLE SPASM 10/18/13   Vicie Mutters, PA-C  hydrochlorothiazide (HYDRODIURIL) 25 MG tablet TAKE 1 TABLET BY MOUTH EVERY MORNING 12/02/13   Unk Pinto, MD  Lancets (FREESTYLE) lancets Test  glucose 1 time daily 08/16/13   Unk Pinto, MD     Assessment/Plan Microperforation of bowel possibly secondary to appendicitis or diverticulitis Hypertension Hyperlipidemia AODM Asthma Degenerative disc disease/spinal stenosis SCD's for DVT prophylaxis   Plan:  She has had 6 days of Invanz, she gets dose in the PM.  I think we can advance her diet and discuss changing to PO antibiotics.         LOS: 6 days    Casmer Yepiz 03/04/2014

## 2014-03-04 NOTE — Progress Notes (Signed)
CARE MANAGEMENT NOTE 03/04/2014  Patient:  Kristine Burnett, Kristine Burnett   Account Number:  [id number]  Date Initiated:  02/28/2014  Documentation initiated by:  Edwyna Shell  Subjective/Objective Assessment:   80 yo female admitted with bowel perforation from home, has family support and uses walker     Action/Plan:   discharge planning   Anticipated DC Date:  03/02/2014   Anticipated DC Plan:  [place]  CM consult      Choice offered to / List presented to:  C-1 Patient           Status of service:  In process, will continue to follow Medicare Important Message given?   (If response is "NO", the following Medicare IM given date fields will be blank) Date Medicare IM given:   Medicare IM given by:   Date Additional Medicare IM given:   Additional Medicare IM given by:    Discharge Disposition:    Per UR Regulation:    If discussed at Long Length of Stay Meetings, dates discussed:    Comments:  03/04/14 Edwyna Shell RN BSN CM [phone] Spoke with patient regarding recommendation for [facility] PT follow up and patient stated that she will have to discuss with her daughters. Patient stated that she lives at home, drives self, and has the support of her daughter. Will continue to follow  02/28/14 Edwyna Shell RN BSN CM [phone] No orders, no needs

## 2014-03-04 NOTE — Progress Notes (Signed)
Patient ID: Kristine Burnett, female   DOB: 06/18/1929, 80 y.o.   MRN: [id number] [facility] PROGRESS NOTE  Kristine Burnett GYB:[id number] DOB: 03/26/1929 DOA: 02/26/2014 PCP: Kristine Richards, MD  Brief narrative:    80 y.o. female with history of diabetes mellitus, hypertension and hyperlipidemia who presented to [facility] ED with complaints of lower abdominal pain radiating to bilateral sides for past 2 days prior to this admission. Patient reported some nausea but no vomiting or fevers. On admission, patient was hemodynamically stable. Evaluation included CT abdomen and pelvis which was concerning for bowel perforation. So far, surgery not indicated since patient is improving with current management which includes IVF, abx.  Assessment/Plan:    Principal Problem:  Bowel perforation - CT abdomen and pelvis with contrast showed free fluid and small amount of free air in the abdomen suspicious for bowel perforation. - Patient is improving clinically. No surgical intervention indicated  - Continue ertapenem.  - Current diet is full liquid. We'll advance diet per surgery recommendations  Active problems:  Hypokalemia - Secondary to GI losses. Potassium was supplemented through IV fluids.  - Potassium remains within normal limits.  Essential hypertension - We will stop IV metoprolol and resume home antihypertensive medications.  Diabetes mellitus, controlled with no complications - A7G in 81/1572 6.1 indicating good glycemic control - Resume metformin.  DVT Prophylaxis  - SCD's bilaterally   Code Status: Full.  Family Communication: plan of care discussed with the patient and her daughter at the bedside  Disposition Plan: Home when stable.    IV access:   Peripheral IV  Procedures and diagnostic studies:     Ct Abdomen Pelvis W Contrast 02/26/2014 Free fluid and small amount of free air in the abdomen suspicious for bowel perforation. Cause not identified. Appendix is  not visualized but inflammatory changes are present in the right lower quadrant, possibly indicating perforated appendicitis. 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 Vascular compromise to the bowel is not excluded. Surgical absence of the gallbladder with prominent intra and extrahepatic bile duct dilatation. This is likely postoperative but occult noncalcified common duct stone is not excluded.   Medical Consultants:  Surgery   Other Consultants:  Physical therapy   IAnti-Infectives:   Ertapenem 02/27/2014 -->   Kristine Lenz, MD  [facility] Pager [phone]  If 7PM-7AM, please contact night-coverage [email or web address] Password [facility] 03/04/2014, 12:37 PM   LOS: 6 days    HPI/Subjective: No acute overnight events.  Objective: Filed Vitals:   03/03/14 0503 03/03/14 1509 03/03/14 2108 03/04/14 0504  BP: 135/68 143/72 135/57 165/87  Pulse: 90 86 102 96  Temp: 98.4 F (36.9 C)  98.4 F (36.9 C) 99.2 F (37.3 C)  TempSrc: Oral  Oral Oral  Resp: 16 18 18 18   Height:      Weight:    71.124 kg (156 lb 12.8 oz)  SpO2: 100% 100% 100% 100%    Intake/Output Summary (Last 24 hours) at 03/04/14 1237 Last data filed at 03/04/14 0753  Gross per 24 hour  Intake      0 ml  Output    100 ml  Net   -100 ml    Exam:   General:  Pt is not in acute distress  Cardiovascular: Regular rate and rhythm, S1/S2, no murmurs  Respiratory: no wheezing, no crackles, no rhonchi  Abdomen: Soft, non tender, non distended, bowel sounds present  Extremities: No edema, pulses DP and PT palpable  bilaterally  Neuro: Grossly nonfocal  Data Reviewed: Basic Metabolic Panel:  Recent Labs Lab 02/26/14 2006 02/27/14 0455 02/28/14 0415 03/03/14 0440  NA 133* 131* 135* 137  K 3.5* 3.6* 4.3 4.7  CL 92* 91* 99 102  CO2 27 26 27 28   GLUCOSE 135* 132* 88 109*  BUN 14 12 16 7   CREATININE 0.70 0.72 0.75 0.58  CALCIUM 9.4 8.9 8.4  8.7   Liver Function Tests:  Recent Labs Lab 02/26/14 2006 02/27/14 0455  AST 60* 46*  ALT 57* 47*  ALKPHOS 225* 177*  BILITOT 0.4 0.4  PROT 7.6 6.9  ALBUMIN 3.5 3.0*   No results for input(s): LIPASE, AMYLASE in the last 168 hours. No results for input(s): AMMONIA in the last 168 hours. CBC:  Recent Labs Lab 02/26/14 2006 02/27/14 0455 02/28/14 0415 03/01/14 0548 03/03/14 0440  WBC 3.6* 5.6 9.7 10.1 9.1  NEUTROABS 2.7 4.4  --   --   --   HGB 13.3 12.7 10.8* 10.2* 10.4*  HCT 38.6 38.4 32.1* 31.5* 31.9*  MCV 89.6 92.3 90.7 92.6 91.4  PLT 153 146* 154 147* 160   Cardiac Enzymes: No results for input(s): CKTOTAL, CKMB, CKMBINDEX, TROPONINI in the last 168 hours. BNP: Invalid input(s): POCBNP CBG:  Recent Labs Lab 03/03/14 0808 03/03/14 1228 03/03/14 1634 03/04/14 0735 03/04/14 1157  GLUCAP 103* 149* 139* 94 183*    No results found for this or any previous visit (from the past 240 hour(s)).   Scheduled Meds: . ertapenem  1 g Intravenous Q24H  . insulin aspart  0-9 Units Subcutaneous TID WC  . metoprolol  2.5 mg Intravenous 4 times per day

## 2014-03-05 ENCOUNTER — Encounter (HOSPITAL_COMMUNITY): Payer: Self-pay | Admitting: Radiology

## 2014-03-05 ENCOUNTER — Ambulatory Visit (HOSPITAL_COMMUNITY): Payer: Medicare HMO

## 2014-03-05 DIAGNOSIS — N739 Female pelvic inflammatory disease, unspecified: Secondary | ICD-10-CM | POA: Insufficient documentation

## 2014-03-05 LAB — GLUCOSE, CAPILLARY
GLUCOSE-CAPILLARY: 121 mg/dL — AB (ref 70–99)
Glucose-Capillary: 107 mg/dL — ABNORMAL HIGH (ref 70–99)
Glucose-Capillary: 120 mg/dL — ABNORMAL HIGH (ref 70–99)

## 2014-03-05 LAB — APTT: aPTT: 36 seconds (ref 24–37)

## 2014-03-05 LAB — PROTIME-INR
INR: 1.06 (ref 0.00–1.49)
Prothrombin Time: 13.9 seconds (ref 11.6–15.2)

## 2014-03-05 MED ORDER — IOHEXOL 300 MG/ML  SOLN
100.0000 mL | Freq: Once | INTRAMUSCULAR | Status: AC | PRN
  Administered 2014-03-05: 100 mL via INTRAVENOUS

## 2014-03-05 MED ORDER — IOHEXOL 300 MG/ML  SOLN
25.0000 mL | INTRAMUSCULAR | Status: AC
  Administered 2014-03-05 (×2): 25 mL via ORAL

## 2014-03-05 NOTE — Progress Notes (Signed)
Physical Therapy Treatment Patient Details Name: Kristine Burnett MRN: [id number] DOB: 1929-12-12 Today's Date: 03/05/2014    History of Present Illness ABD pain    PT Comments    Assisted pt out of recliner to [facility] then amb in hallway twice with one standing rest break.  Pt feeling better.  Follow Up Recommendations  Home health PT;No PT follow up     Equipment Recommendations       Recommendations for Other Services       Precautions / Restrictions Precautions Precautions: Fall Restrictions Weight Bearing Restrictions: No    Mobility  Bed Mobility               General bed mobility comments: pt OOB in recliner  Transfers Overall transfer level: Needs assistance Equipment used: Rolling walker (2 wheeled) Transfers: Sit to/from Stand Sit to Stand: Supervision;Min guard         General transfer comment: one VC for safety with turn completion  Ambulation/Gait Ambulation/Gait assistance: Supervision;Min guard Ambulation Distance (Feet): 385 Feet Assistive device: Rolling walker (2 wheeled) Gait Pattern/deviations: Step-through pattern;Trunk flexed Gait velocity: WFL   General Gait Details: <25% VC's safety with turns   Financial trader [employer] (Stroke Patients Only)       Balance                                    Cognition                            Exercises      General Comments        Pertinent Vitals/Pain Pain Assessment: No/denies pain    Home Living                      Prior Function            PT Goals (current goals can now be found in the care plan section) Progress towards PT goals: Progressing toward goals    Frequency  Min 3X/week    PT Plan      Co-evaluation             End of Session Equipment Utilized During Treatment: Gait belt Activity Tolerance: Patient tolerated treatment well Patient left: in chair;with call bell/phone  within reach;with family/visitor present     Time: 1110-1135 PT Time Calculation (min) (ACUTE ONLY): 25 min  Charges:  $Gait Training: 8-22 mins $Therapeutic Activity: 8-22 mins                    G Codes:      Rica Koyanagi  PTA [facility]  Acute  Rehab Pager      [phone]

## 2014-03-05 NOTE — Progress Notes (Signed)
CT scan shows development of a RLQ abscess and dilated appendix.  Most likely etiology of this process is perforated appendicitis with abscess.  Will consult IR for percutaneous drainage.

## 2014-03-05 NOTE — Progress Notes (Signed)
  Subjective: CT scan shows a large fluid collection, and she has seen Dr. Zella Richer.  She is asymptomatic right now.    Objective: Vital signs in last 24 hours: Temp:  [98.3 F (36.8 C)-98.5 F (36.9 C)] 98.4 F (36.9 C) (12/16 0517) Pulse Rate:  [67-71] 67 (12/16 0517) Resp:  [16-18] 18 (12/16 0517) BP: (117-145)/(59-69) 117/63 mmHg (12/16 0517) SpO2:  [98 %-100 %] 98 % (12/16 0517) Weight:  [70.897 kg (156 lb 4.8 oz)] 70.897 kg (156 lb 4.8 oz) (12/16 0500) Last BM Date: 03/03/14 680 PO   Carb modified diet Afebrile, VSS No labs, for CT scan today shows: Two adjacent collections have developed in the right pelvis since the prior exam. The largest measures 7.7 cm in greatest dimension. It contains fluid and nondependent air. The smaller collection lies below this near the inguinal ligament measuring 2.8 cm in greatest dimension. The larger collection borders on the posterior lateral cecum. It also lies adjacent to a dilated tubular structure that appears to be blind-ending. This is concerning for a dilated appendix. There is surrounding inflammatory change. Findings are most suspicious for acute appendicitis with rupture and abscess Formation. I cannot pull up images on this computer Intake/Output from previous day: 12/15 0701 - 12/16 0700 In: 730 [P.O.:680; IV Piggyback:50] Out: 300 [Urine:300] Intake/Output this shift: Total I/O In: -  Out: 250 [Urine:250]  General appearance: alert, cooperative and no distress GI: soft, non-tender; bowel sounds normal; no masses,  no organomegaly  Lab Results:   Recent Labs  03/03/14 0440  WBC 9.1  HGB 10.4*  HCT 31.9*  PLT 160    BMET  Recent Labs  03/03/14 0440  NA 137  K 4.7  CL 102  CO2 28  GLUCOSE 109*  BUN 7  CREATININE 0.58  CALCIUM 8.7   PT/INR No results for input(s): LABPROT, INR in the last 72 hours.   Recent Labs Lab 02/26/14 2006 02/27/14 0455  AST 60* 46*  ALT 57* 47*  ALKPHOS 225* 177*   BILITOT 0.4 0.4  PROT 7.6 6.9  ALBUMIN 3.5 3.0*     Lipase     Component Value Date/Time   LIPASE 17 12/30/2008 0003     Studies/Results: No results found.  Medications: . antiseptic oral rinse  7 mL Mouth Rinse BID  . atenolol  100 mg Oral Daily  . diltiazem  120 mg Oral Q12H  . ertapenem  1 g Intravenous Q24H  . insulin aspart  0-9 Units Subcutaneous TID WC  . metFORMIN  500 mg Oral BID WC    Assessment/Plan Microperforation of bowel possibly secondary to appendicitis or diverticulitis Hypertension Hyperlipidemia AODM Asthma Degenerative disc disease/spinal stenosis SCD's for DVT prophylaxis   Plan:  Ask IR to see and evaluate for drain.    LOS: 7 days    Kristine Burnett 03/05/2014

## 2014-03-05 NOTE — Progress Notes (Signed)
No pain, nausea, or tenderness.  CT pending.  Will check CT and may further recs.

## 2014-03-05 NOTE — Progress Notes (Signed)
Patient ID: Kristine Burnett, female   DOB: 1929/07/11, 80 y.o.   MRN: [id number] [facility] PROGRESS NOTE  KIEARA SCHWARK TKZ:[id number] DOB: 1929-11-05 DOA: 02/26/2014 PCP: Alesia Richards, MD  Brief narrative:    80 y.o. female with history of diabetes mellitus, hypertension and hyperlipidemia who presented to [facility] ED with complaints of lower abdominal pain radiating to bilateral sides for past 2 days prior to this admission. Patient reported some nausea but no vomiting or fevers. On admission, patient was hemodynamically stable. Evaluation included CT abdomen and pelvis which was concerning for bowel perforation. She did not require surgical intervention as she was getting better with IV fluids and ertapenem. Repeat CT abdomen showed RLQ abscess and dilated appendix. IR consulted for percutaneous drainage of abscess.   Assessment/Plan:    Principal Problem:  Bowel perforation - CT abdomen and pelvis with contrast showed free fluid and small amount of free air in the abdomen suspicious for bowel perforation. Pt initially improved with IV abx. Repeat CT abdomen showed RLQ abscess and dilated appendix. IR consulted for percutaneous drainage of abscess.  - Continue ertapenem.   Active problems:  Hypokalemia - Secondary to GI losses. Potassium was supplemented through IV fluids.  - Potassium remains within normal limits.  Essential hypertension - Resumed home antihypertensive medications.  Diabetes mellitus, controlled with no complications - T7D in 22/0254 6.1 indicating good glycemic control - Resumed metformin.  DVT Prophylaxis  - SCD's bilaterally   Code Status: Full.  Family Communication: plan of care discussed with the patient and her daughter at the bedside  Disposition Plan: Home when stable.    IV access:   Peripheral IV  Procedures and diagnostic studies:    Ct Abdomen Pelvis W Contrast 02/26/2014 Free fluid and small amount of free air in  the abdomen suspicious for bowel perforation. Cause not identified. Appendix is not visualized but inflammatory changes are present in the right lower quadrant, possibly indicating perforated appendicitis. 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 Vascular compromise to the bowel is not excluded. Surgical absence of the gallbladder with prominent intra and extrahepatic bile duct dilatation. This is likely postoperative but occult noncalcified common duct stone is not excluded.   Ct Abdomen Pelvis W Contrast 03/05/2014  1. Two adjacent collections have developed in the right pelvis since the prior exam. The largest measures 7.7 cm in greatest dimension. It contains fluid and nondependent air. The smaller collection lies below this near the inguinal ligament measuring 2.8 cm in greatest dimension. The larger collection borders on the posterior lateral cecum. It also lies adjacent to a dilated tubular structure that appears to be blind-ending. This is concerning for a dilated appendix. There is surrounding inflammatory change. Findings are most suspicious for acute appendicitis with rupture and abscess formation. 2. Free air noted on the prior CT is no longer evident. 3. No other acute findings.   Medical Consultants:  Surgery   Other Consultants:  Physical therapy   IAnti-Infectives:   Ertapenem 02/27/2014 -->   Leisa Lenz, MD  [facility] Pager [phone]  If 7PM-7AM, please contact night-coverage [email or web address] Password [email or web address] 03/05/2014, 2:28 PM   LOS: 7 days    HPI/Subjective: No acute overnight events.  Objective: Filed Vitals:   03/04/14 1500 03/04/14 2051 03/05/14 0500 03/05/14 0517  BP: 135/69 145/59  117/63  Pulse: 71 70  67  Temp: 98.3 F (36.8 C) 98.5 F (36.9 C)  98.4 F (36.9 C)  TempSrc: Oral Oral  Oral  Resp: 16 18  18   Height:      Weight:   70.897 kg (156 lb 4.8 oz)   SpO2: 98% 100%  98%     Intake/Output Summary (Last 24 hours) at 03/05/14 1428 Last data filed at 03/05/14 0857  Gross per 24 hour  Intake    250 ml  Output    450 ml  Net   -200 ml    Exam:   General:  Pt is alert, follows commands appropriately, not in acute distress  Cardiovascular: Regular rate and rhythm, S1/S2 appreciated   Respiratory: Clear to auscultation bilaterally, no wheezing  Abdomen: Soft, non tender, non distended, bowel sounds present  Extremities: No edema, pulses DP and PT palpable bilaterally  Neuro: Grossly nonfocal  Data Reviewed: Basic Metabolic Panel:  Recent Labs Lab 02/26/14 2006 02/27/14 0455 02/28/14 0415 03/03/14 0440  NA 133* 131* 135* 137  K 3.5* 3.6* 4.3 4.7  CL 92* 91* 99 102  CO2 27 26 27 28   GLUCOSE 135* 132* 88 109*  BUN 14 12 16 7   CREATININE 0.70 0.72 0.75 0.58  CALCIUM 9.4 8.9 8.4 8.7   Liver Function Tests:  Recent Labs Lab 02/26/14 2006 02/27/14 0455  AST 60* 46*  ALT 57* 47*  ALKPHOS 225* 177*  BILITOT 0.4 0.4  PROT 7.6 6.9  ALBUMIN 3.5 3.0*   No results for input(s): LIPASE, AMYLASE in the last 168 hours. No results for input(s): AMMONIA in the last 168 hours. CBC:  Recent Labs Lab 02/26/14 2006 02/27/14 0455 02/28/14 0415 03/01/14 0548 03/03/14 0440  WBC 3.6* 5.6 9.7 10.1 9.1  NEUTROABS 2.7 4.4  --   --   --   HGB 13.3 12.7 10.8* 10.2* 10.4*  HCT 38.6 38.4 32.1* 31.5* 31.9*  MCV 89.6 92.3 90.7 92.6 91.4  PLT 153 146* 154 147* 160   Cardiac Enzymes: No results for input(s): CKTOTAL, CKMB, CKMBINDEX, TROPONINI in the last 168 hours. BNP: Invalid input(s): POCBNP CBG:  Recent Labs Lab 03/04/14 0735 03/04/14 1157 03/04/14 1627 03/05/14 0731 03/05/14 1229  GLUCAP 94 183* 127* 121* 107*    No results found for this or any previous visit (from the past 240 hour(s)).   Scheduled Meds: . antiseptic oral rinse  7 mL Mouth Rinse BID  . atenolol  100 mg Oral Daily  . diltiazem  120 mg Oral Q12H  . ertapenem  1  g Intravenous Q24H  . insulin aspart  0-9 Units Subcutaneous TID WC  . metFORMIN  500 mg Oral BID WC   Continuous Infusions:

## 2014-03-05 NOTE — Consult Note (Signed)
Reason for consult: pelvic fluid collection(s) drainage  Referring Physician(s): CCS  History of Present Illness: Kristine Burnett is a 80 y.o. female with history of diabetes mellitus, prior cholecystectomy,  hypertension and hyperlipidemia who presented to [facility] ED with complaints of lower abdominal pain radiating to bilateral sides for past 2 days prior to this admission. Patient reported some nausea but no vomiting or fevers. On admission, patient was hemodynamically stable. Evaluation included CT abdomen and pelvis which revealed two adjacent fluid collections  in the right pelvis . The largest measured 7.7 cm in greatest dimension.It contained  fluid and nondependent air. The smaller collection lies below this near the inguinal ligament measuring 2.8 cm in greatestdimension. The larger collection borders on the posterior lateral cecum. It also lies adjacent to a dilated tubular structure thatappears to be blind-ending. This was concerning for a dilated appendix. There was surrounding inflammatory change. Findings were most suspicious for acute appendicitis with rupture and abscess formation. Request now received for CT guided drainage of the fluid collection(s).  Past Medical History  Diagnosis Date  . Hypertension   . Hyperlipidemia   . Diabetes mellitus without complication   . Vitamin D deficiency   . Asthma   . DDD (degenerative disc disease)   . Spinal stenosis   . Type II or unspecified type diabetes mellitus without mention of complication, not stated as uncontrolled 03/22/2013    Past Surgical History  Procedure Laterality Date  . Hernia repair    . Cyst excision  back  . Cholecystectomy    . Mini-laparotomy w/ tubal ligation      Allergies: Ace inhibitors; Crestor; Grapefruit extract; Minoxidil; and Penicillins  Medications: Prior to Admission medications   Medication Sig Start Date End Date Taking? Authorizing Provider  aspirin 325 MG tablet Take 325 mg by mouth daily.    Yes Historical Provider, MD  atenolol (TENORMIN) 100 MG tablet TAKE 1 TABLET BY MOUTH DAILY FOR BLOOD PRESSURE 12/02/13  Yes Unk Pinto, MD  Cholecalciferol (VITAMIN D-3) 1000 UNITS CAPS Take 4,000 Units by mouth daily.   Yes Historical Provider, MD  Cyanocobalamin (VITAMIN B-12) 2500 MCG SUBL Place 1 tablet under the tongue once a week.    Yes Historical Provider, MD  diltiazem (CARDIZEM) 120 MG tablet TAKE 1 TABLET BY MOUTH TWICE DAILY FOR HIGH BLOOD PRESSURE 08/03/13  Yes Vicie Mutters, PA-C  enalapril (VASOTEC) 20 MG tablet TAKE 1 TABLET BY MOUTH TWICE DAILY 01/28/14  Yes Unk Pinto, MD  Flaxseed, Linseed, (FLAXSEED OIL) 1000 MG CAPS Take 1,000 mg by mouth 4 (four) times daily.   Yes Historical Provider, MD  hydrochlorothiazide (HYDRODIURIL) 25 MG tablet TAKE 1 TABLET BY MOUTH EVERY MORNING 12/02/13  Yes Unk Pinto, MD  Magnesium Oxide (MAG-OX PO) Take 500 mg by mouth 2 (two) times daily.   Yes Historical Provider, MD  metFORMIN (GLUCOPHAGE) 500 MG tablet Take 1 tablet (500 mg total) by mouth 2 (two) times daily with a meal. 06/27/13 06/27/14 Yes Melissa R Smith, PA-C  Multiple Vitamins-Minerals (MULTIVITAMIN & MINERAL PO) Take by mouth daily.   Yes Historical Provider, MD  naproxen sodium (ANAPROX) 220 MG tablet Take 220 mg by mouth 2 (two) times daily with a meal.   Yes Historical Provider, MD  oxybutynin (DITROPAN) 5 MG tablet TAKE 1 TABLET BY MOUTH TWICE DAILY FOR BLADDER 01/28/14  Yes Unk Pinto, MD  simvastatin (ZOCOR) 40 MG tablet TAKE 1 TABLET BY MOUTH EVERY NIGHT AT BEDTIME FOR CHOLESTEROL 08/03/13  Yes Vicie Mutters, PA-C  alendronate (FOSAMAX) 70 MG tablet Take 1 tablet (70 mg total) by mouth once a week. Take with a full glass of water on an empty stomach. 10/29/13   Unk Pinto, MD  baclofen (LIORESAL) 10 MG tablet TAKE 1/2 TO 1 TABLET BY MOUTH THREE TIMES DAILY AS NEEDED FOR MUSCLE SPASM    Melissa R Smith, PA-C  baclofen (LIORESAL) 10 MG tablet TAKE 1/2 TO 1  TABLET BY MOUTH THREE TIMES DAILY AS NEEDED FOR MUSCLE SPASM 10/18/13   Vicie Mutters, PA-C  hydrochlorothiazide (HYDRODIURIL) 25 MG tablet TAKE 1 TABLET BY MOUTH EVERY MORNING 12/02/13   Unk Pinto, MD  Lancets (FREESTYLE) lancets Test glucose 1 time daily 08/16/13   Unk Pinto, MD    Family History  Problem Relation Age of Onset  . Heart attack Mother   . Cancer Mother     breast  . Hypertension Mother   . Heart attack Father   . Stroke Sister   . Cancer Sister     breast  . Heart attack Brother   . Heart attack Brother     History   Social History  . Marital Status: Widowed    Spouse Name: N/A    Number of Children: N/A  . Years of Education: N/A   Social History Main Topics  . Smoking status: Former Research scientist (life sciences)  . Smokeless tobacco: None  . Alcohol Use: No  . Drug Use: No  . Sexual Activity: None   Other Topics Concern  . None   Social History Narrative         Review of Systems see above  Vital Signs: BP 117/63 mmHg  Pulse 67  Temp(Src) 98.4 F (36.9 C) (Oral)  Resp 18  Ht 5\' 7"  (1.702 m)  Wt 156 lb 4.8 oz (70.897 kg)  BMI 24.47 kg/m2  SpO2 98%  Physical Exam pt awake/alert; chest- CTA bilat; heart- RRR; abd- soft,+BS,NT; ext- FROM, no edema  Imaging: Ct Abdomen Pelvis W Contrast  03/05/2014   CLINICAL DATA:  Diverticulitis, lower abdominal pain, constipation, tubal ligation, cholecystectomy.  EXAM: CT ABDOMEN AND PELVIS WITH CONTRAST  TECHNIQUE: Multidetector CT imaging of the abdomen and pelvis was performed using the standard protocol following bolus administration of intravenous contrast.  CONTRAST:  174mL OMNIPAQUE IOHEXOL 300 MG/ML  SOLN  COMPARISON:  02/26/2014  FINDINGS: There is a well-defined collection right pelvis adjacent to the external iliac vessels and bordering on the posterior lateral margin of the cecum. This collection measures 7.7 cm x 3.8 cm x 4.5 cm. It contains nondependent air, but is mostly fluid filled. Wall is  relatively thick and there is adjacent inflammatory change. There is a smaller collection inferiorly adjacent to the right inguinal ligament measuring 2.8 cm x 2 cm in greatest transverse dimensions.  The wall of the adjacent cecum is thickened vertically along its posterior lateral margin. There is a dilated tubular structure that extends from the posterior cecal tip along the posterior lateral margin of the cecum. It measures 10.5 mm in diameter. This is likely to reflect a dilated appendix.  The small bubbles of free air noted on the prior study are no longer evident.  Heart is mildly enlarged. There is mild chronic bronchiectasis with associated scarring in the right lower lobe, stable.  Chronic dilation of the intra and extrahepatic biliary tree is noted. Common bile duct measures a maximum of 12 mm. There is also dilation of the cystic duct remnant in this patient has  had a cholecystectomy. These findings are stable.  No liver mass or focal lesion. Spleen is unremarkable. No pancreatic mass or inflammatory change. No adrenal masses. Low-density, nonenhancing right renal lesions are noted consistent with cysts. These are stable. Mild dilation of both renal pelvises and portions of the ureters. This is most likely due to depressed peristalsis due to the pelvic inflammation. No ureteral stone. Bladder is unremarkable.  No pathologically enlarged lymph nodes.  IMPRESSION: 1. Two adjacent collections have developed in the right pelvis since the prior exam. The largest measures 7.7 cm in greatest dimension. It contains fluid and nondependent air. The smaller collection lies below this near the inguinal ligament measuring 2.8 cm in greatest dimension. The larger collection borders on the posterior lateral cecum. It also lies adjacent to a dilated tubular structure that appears to be blind-ending. This is concerning for a dilated appendix. There is surrounding inflammatory change. Findings are most suspicious for  acute appendicitis with rupture and abscess formation. 2. Free air noted on the prior CT is no longer evident. 3. No other acute findings.   Electronically Signed   By: Lajean Manes M.D.   On: 03/05/2014 11:01   Ct Abdomen Pelvis W Contrast  02/26/2014   CLINICAL DATA:  Right lower quadrant pain common nausea comminuted and vomiting.  EXAM: CT ABDOMEN AND PELVIS WITH CONTRAST  TECHNIQUE: Multidetector CT imaging of the abdomen and pelvis was performed using the standard protocol following bolus administration of intravenous contrast.  CONTRAST:  19mL OMNIPAQUE IOHEXOL 300 MG/ML SOLN, 131mL OMNIPAQUE IOHEXOL 300 MG/ML SOLN  COMPARISON:  12/30/2008  FINDINGS: Mild dependent atelectasis in the lung bases. Mild central bronchiectasis in the right lung base. Residual contrast material in the lower esophagus likely represents dysmotility or reflux. No esophageal dilatation.  Surgical absence of the gallbladder intra and extrahepatic bile duct dilatation. Bile duct dilatation is likely physiologic in the postoperative setting however noncalcified stone is not excluded. No focal liver lesions. The pancreas, spleen, adrenal glands, abdominal aorta, inferior vena cava, and retroperitoneal lymph nodes are unremarkable. 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 Distal vessel appears patent. Stomach is unremarkable. Small bowel are mostly decompressed. Gas and stool filled colon. Small amount of free fluid in the upper abdomen along the pericolic gutters and extending into the pelvis. With small free gas bubbles are demonstrated in the abdomen. Appearance suggests bowel perforation. Cause is not determined.  Pelvis: The bladder wall is not thickened. No pelvic mass or lymphadenopathy identified. Appendix is not visualized. There is infiltration and focal free air demonstrated in the right lower quadrant and given the history of right lower quadrant pain, a perforated  appendix should be considered. No evidence of diverticulitis. Degenerative changes in the lumbar spine. Slight anterior subluxation of L4 on L5 and L3 on L4, likely degenerative.  IMPRESSION: Free fluid and small amount of free air in the abdomen suspicious for bowel perforation. Cause not identified. Appendix is not visualized but inflammatory changes are present in the right lower quadrant, possibly indicating perforated appendicitis. 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 Vascular compromise to the bowel is not excluded. Surgical absence of the gallbladder with prominent intra and extrahepatic bile duct dilatation. This is likely postoperative but occult noncalcified common duct stone is not excluded.  These results were called by telephone at the time of interpretation on 02/26/2014 at 10:25 pm to Dr. Blanchie Dessert , who verbally  acknowledged these results.   Electronically Signed   By: Lucienne Capers M.D.   On: 02/26/2014 22:31    Labs:  CBC:  Recent Labs  02/27/14 0455 02/28/14 0415 03/01/14 0548 03/03/14 0440  WBC 5.6 9.7 10.1 9.1  HGB 12.7 10.8* 10.2* 10.4*  HCT 38.4 32.1* 31.5* 31.9*  PLT 146* 154 147* 160    COAGS:  Recent Labs  03/05/14 1315  INR 1.06  APTT 36    BMP:  Recent Labs  02/26/14 2006 02/27/14 0455 02/28/14 0415 03/03/14 0440  NA 133* 131* 135* 137  K 3.5* 3.6* 4.3 4.7  CL 92* 91* 99 102  CO2 27 26 27 28   GLUCOSE 135* 132* 88 109*  BUN 14 12 16 7   CALCIUM 9.4 8.9 8.4 8.7  CREATININE 0.70 0.72 0.75 0.58  GFRNONAA 79* 78* 77* 84*  GFRAA >90 >90 89* >90    LIVER FUNCTION TESTS:  Recent Labs  09/17/13 1657 12/25/13 1111 02/26/14 2006 02/27/14 0455  BILITOT 0.4 0.3 0.4 0.4  AST 23 22 60* 46*  ALT 15 14 57* 47*  ALKPHOS 64 71 225* 177*  PROT 7.3 7.6 7.6 6.9  ALBUMIN 4.1 4.0 3.5 3.0*    TUMOR MARKERS: No results for input(s): AFPTM, CEA, CA199, CHROMGRNA in the last 8760  hours.  Assessment and Plan: Pt with prior hx of abd pain, nausea and recent CT revealing RLQ fluid collection(s),?abscess/dilated appendix. Imaging studies have been reviewed by Dr. Pascal Lux. Plan is for CT guided drainage of the fluid collection(s) on 12/17. Details/risks of procedure d/w pt/daughter with their understanding and consent.    I spent a total of 20 minutes face to face in clinical consultation, greater than 50% of which was counseling/coordinating care for right pelvic fluid collection drainage  Signed: Autumn Messing 03/05/2014, 2:38 PM

## 2014-03-06 ENCOUNTER — Inpatient Hospital Stay (HOSPITAL_COMMUNITY): Payer: Medicare HMO

## 2014-03-06 DIAGNOSIS — N732 Unspecified parametritis and pelvic cellulitis: Secondary | ICD-10-CM

## 2014-03-06 LAB — GLUCOSE, CAPILLARY
GLUCOSE-CAPILLARY: 125 mg/dL — AB (ref 70–99)
Glucose-Capillary: 123 mg/dL — ABNORMAL HIGH (ref 70–99)

## 2014-03-06 LAB — CBC WITH DIFFERENTIAL/PLATELET
BASOS PCT: 0 % (ref 0–1)
Basophils Absolute: 0 10*3/uL (ref 0.0–0.1)
EOS ABS: 0.1 10*3/uL (ref 0.0–0.7)
Eosinophils Relative: 1 % (ref 0–5)
HCT: 30.5 % — ABNORMAL LOW (ref 36.0–46.0)
HEMOGLOBIN: 10.3 g/dL — AB (ref 12.0–15.0)
Lymphocytes Relative: 21 % (ref 12–46)
Lymphs Abs: 2.2 10*3/uL (ref 0.7–4.0)
MCH: 29.8 pg (ref 26.0–34.0)
MCHC: 33.8 g/dL (ref 30.0–36.0)
MCV: 88.2 fL (ref 78.0–100.0)
MONO ABS: 1.2 10*3/uL — AB (ref 0.1–1.0)
Monocytes Relative: 11 % (ref 3–12)
Neutro Abs: 7.1 10*3/uL (ref 1.7–7.7)
Neutrophils Relative %: 67 % (ref 43–77)
Platelets: 231 10*3/uL (ref 150–400)
RBC: 3.46 MIL/uL — ABNORMAL LOW (ref 3.87–5.11)
RDW: 14.3 % (ref 11.5–15.5)
WBC: 10.6 10*3/uL — ABNORMAL HIGH (ref 4.0–10.5)

## 2014-03-06 MED ORDER — FENTANYL CITRATE 0.05 MG/ML IJ SOLN
INTRAMUSCULAR | Status: AC | PRN
  Administered 2014-03-06: 50 ug via INTRAVENOUS

## 2014-03-06 MED ORDER — MIDAZOLAM HCL 2 MG/2ML IJ SOLN
INTRAMUSCULAR | Status: AC
  Filled 2014-03-06: qty 6

## 2014-03-06 MED ORDER — ENOXAPARIN SODIUM 40 MG/0.4ML ~~LOC~~ SOLN
40.0000 mg | SUBCUTANEOUS | Status: DC
  Administered 2014-03-06 – 2014-03-08 (×3): 40 mg via SUBCUTANEOUS
  Filled 2014-03-06 (×4): qty 0.4

## 2014-03-06 MED ORDER — MIDAZOLAM HCL 2 MG/2ML IJ SOLN
INTRAMUSCULAR | Status: AC | PRN
  Administered 2014-03-06 (×4): 1 mg via INTRAVENOUS

## 2014-03-06 MED ORDER — FENTANYL CITRATE 0.05 MG/ML IJ SOLN
INTRAMUSCULAR | Status: AC
  Filled 2014-03-06: qty 4

## 2014-03-06 NOTE — Progress Notes (Signed)
Subjective: She says she is still a little sleepy after the IR procedure, but very conversant.  Drain site looks fine.  The fluid in the bag right now is clear.  Objective: Vital signs in last 24 hours: Temp:  [97 F (36.1 C)-98.3 F (36.8 C)] 98.2 F (36.8 C) (12/17 0645) Pulse Rate:  [70-86] 76 (12/17 0903) Resp:  [17-25] 24 (12/17 0903) BP: (135-154)/(55-74) 143/60 mmHg (12/17 0903) SpO2:  [94 %-100 %] 100 % (12/17 0903) Last BM Date: 03/04/14 360 PO  Diet; Clears 2 stools recorded Afebrile, VSS WBC 10.6 Intake/Output from previous day: 12/16 0701 - 12/17 0700 In: 360 [P.O.:360] Out: 850 [Urine:850] Intake/Output this shift:    General appearance: alert, cooperative and no distress GI: sore RLQ around the drain, otherwise normal exam.    Lab Results:   Recent Labs  03/06/14 0525  WBC 10.6*  HGB 10.3*  HCT 30.5*  PLT 231    BMET No results for input(s): NA, K, CL, CO2, GLUCOSE, BUN, CREATININE, CALCIUM in the last 72 hours. PT/INR  Recent Labs  03/05/14 1315  LABPROT 13.9  INR 1.06    No results for input(s): AST, ALT, ALKPHOS, BILITOT, PROT, ALBUMIN in the last 168 hours.   Lipase     Component Value Date/Time   LIPASE 17 12/30/2008 0003     Studies/Results: Ct Abdomen Pelvis W Contrast  03/05/2014   CLINICAL DATA:  Diverticulitis, lower abdominal pain, constipation, tubal ligation, cholecystectomy.  EXAM: CT ABDOMEN AND PELVIS WITH CONTRAST  TECHNIQUE: Multidetector CT imaging of the abdomen and pelvis was performed using the standard protocol following bolus administration of intravenous contrast.  CONTRAST:  160mL OMNIPAQUE IOHEXOL 300 MG/ML  SOLN  COMPARISON:  02/26/2014  FINDINGS: There is a well-defined collection right pelvis adjacent to the external iliac vessels and bordering on the posterior lateral margin of the cecum. This collection measures 7.7 cm x 3.8 cm x 4.5 cm. It contains nondependent air, but is mostly fluid filled. Wall is  relatively thick and there is adjacent inflammatory change. There is a smaller collection inferiorly adjacent to the right inguinal ligament measuring 2.8 cm x 2 cm in greatest transverse dimensions.  The wall of the adjacent cecum is thickened vertically along its posterior lateral margin. There is a dilated tubular structure that extends from the posterior cecal tip along the posterior lateral margin of the cecum. It measures 10.5 mm in diameter. This is likely to reflect a dilated appendix.  The small bubbles of free air noted on the prior study are no longer evident.  Heart is mildly enlarged. There is mild chronic bronchiectasis with associated scarring in the right lower lobe, stable.  Chronic dilation of the intra and extrahepatic biliary tree is noted. Common bile duct measures a maximum of 12 mm. There is also dilation of the cystic duct remnant in this patient has had a cholecystectomy. These findings are stable.  No liver mass or focal lesion. Spleen is unremarkable. No pancreatic mass or inflammatory change. No adrenal masses. Low-density, nonenhancing right renal lesions are noted consistent with cysts. These are stable. Mild dilation of both renal pelvises and portions of the ureters. This is most likely due to depressed peristalsis due to the pelvic inflammation. No ureteral stone. Bladder is unremarkable.  No pathologically enlarged lymph nodes.  IMPRESSION: 1. Two adjacent collections have developed in the right pelvis since the prior exam. The largest measures 7.7 cm in greatest dimension. It contains fluid and nondependent air. The smaller  collection lies below this near the inguinal ligament measuring 2.8 cm in greatest dimension. The larger collection borders on the posterior lateral cecum. It also lies adjacent to a dilated tubular structure that appears to be blind-ending. This is concerning for a dilated appendix. There is surrounding inflammatory change. Findings are most suspicious for  acute appendicitis with rupture and abscess formation. 2. Free air noted on the prior CT is no longer evident. 3. No other acute findings.   Electronically Signed   By: Lajean Manes M.D.   On: 03/05/2014 11:01    Medications: . antiseptic oral rinse  7 mL Mouth Rinse BID  . atenolol  100 mg Oral Daily  . diltiazem  120 mg Oral Q12H  . ertapenem  1 g Intravenous Q24H  . insulin aspart  0-9 Units Subcutaneous TID WC  . metFORMIN  500 mg Oral BID WC     Assessment/Plan Microperforation of bowel possibly secondary to appendicitis or diverticulitis Repeat CT scan 03/05/14 shows;  7.7cm x 3.8 cm x 4.5 cm fluid collection posterior lateral margin of the cecum, and a 2.8 cm x 2 cm fluid collection right inguinal area. IR drain 03/06/14 85 ml purulent fluid aspirated, drain placed  Hypertension Hyperlipidemia AODM Asthma Degenerative disc disease/spinal stenosis SCD's for DVT prophylaxis   Plan:  Keep drain, continue antibiotics, consider heparin or Lovenox for DVT prophylaxis, continue to mobilize, keep her on clears for now.  Day 9 of Invanz, and clears.  Do we want to consider TNA?    LOS: 8 days    Lenzie Sandler 03/06/2014

## 2014-03-06 NOTE — Progress Notes (Addendum)
Patient ID: Kristine Burnett, female   DOB: 12-17-1929, 80 y.o.   MRN: [id number] [facility] PROGRESS NOTE  Kristine Burnett [id number] DOB: 1929/12/21 DOA: 02/26/2014 PCP: Alesia Richards, MD  Brief narrative:    80 y.o. female with history of diabetes mellitus, hypertension and hyperlipidemia who presented to [facility] ED with complaints of lower abdominal pain radiating to bilateral sides for past 2 days prior to this admission. Patient reported some nausea but no vomiting or fevers. On admission, patient was hemodynamically stable. Evaluation included CT abdomen and pelvis which was concerning for bowel perforation. She did not require surgical intervention as she was getting better with IV fluids and ertapenem. Repeat CT abdomen showed RLQ abscess and dilated appendix. IR consulted for percutaneous drainage of abscess.    Assessment/Plan:     Principal Problem:  Bowel perforation / right lower quadrant abscess / leukocytosis  - CT abdomen and pelvis with contrast showed free fluid and small amount of free air in the abdomen suspicious for bowel perforation. Pt initially improved with IV abx. Repeat CT abdomen showed RLQ abscess and dilated appendix. IR consulted for percutaneous drainage of abscess.  - Patient is status post CT-guided drainage of right lower quadrant abscess 03/06/2014 and fluid sent for culture analysis. - Continue ertapenem.   Active problems:  Hypokalemia - Secondary to GI losses. Potassium was supplemented through IV fluids.  - Potassium remains within normal limits.  Essential hypertension - Resumed home antihypertensive medications: atenolol 100 mg daily and Cardizem 120 mg PO Q 12 hours   Diabetes mellitus, controlled with no complications - G6K in 59/9357 6.1 indicating good glycemic control - Resumed metformin.  DVT Prophylaxis  - SCD's bilaterally   Code Status: Full.  Family Communication: plan of care discussed with the patient and her  daughter at the bedside  Disposition Plan: Home when stable.    IV access:   Peripheral IV  Procedures and diagnostic studies:    Ct Abdomen Pelvis W Contrast 02/26/2014 Free fluid and small amount of free air in the abdomen suspicious for bowel perforation. Cause not identified. Appendix is not visualized but inflammatory changes are present in the right lower quadrant, possibly indicating perforated appendicitis. 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 Vascular compromise to the bowel is not excluded. Surgical absence of the gallbladder with prominent intra and extrahepatic bile duct dilatation. This is likely postoperative but occult noncalcified common duct stone is not excluded.   Ct Abdomen Pelvis W Contrast 03/05/2014 1. Two adjacent collections have developed in the right pelvis since the prior exam. The largest measures 7.7 cm in greatest dimension. It contains fluid and nondependent air. The smaller collection lies below this near the inguinal ligament measuring 2.8 cm in greatest dimension. The larger collection borders on the posterior lateral cecum. It also lies adjacent to a dilated tubular structure that appears to be blind-ending. This is concerning for a dilated appendix. There is surrounding inflammatory change. Findings are most suspicious for acute appendicitis with rupture and abscess formation. 2. Free air noted on the prior CT is no longer evident. 3. No other acute findings.   Ct Image Guided Drainage By Percutaneous Catheter 03/06/2014   Status post CT-guided drainage of right lower quadrant abscess. A 12 French pigtail catheter was placed.  85 cc of frank pus was aspirated with a sample sent to the lab for culture.  Medical Consultants:  Surgery  Interventional radiology   Other Consultants:  Physical therapy  IAnti-Infectives:   Ertapenem 02/27/2014 -->   Leisa Lenz, MD  [facility] Pager  [phone]  If 7PM-7AM, please contact night-coverage [email or web address] Password [facility] 03/06/2014, 6:58 PM   LOS: 8 days    HPI/Subjective: No acute overnight events.  Objective: Filed Vitals:   03/06/14 0855 03/06/14 0859 03/06/14 0903 03/06/14 1432  BP: 154/68 138/65 143/60 119/55  Pulse: 74 74 76 66  Temp:    98 F (36.7 C)  TempSrc:    Oral  Resp: 25 21 24 18   Height:      Weight:      SpO2: 99% 100% 100% 99%    Intake/Output Summary (Last 24 hours) at 03/06/14 1858 Last data filed at 03/06/14 1657  Gross per 24 hour  Intake      5 ml  Output     50 ml  Net    -45 ml    Exam:   General:  Pt is alert, follows commands appropriately, not in acute distress  Cardiovascular: Regular rate and rhythm, S1/S2, no murmurs  Respiratory: Clear to auscultation bilaterally, no wheezing, no crackles, no rhonchi  Abdomen: Soft, non tender, non distended, bowel sounds present; RLQ drain in place   Extremities: No edema, pulses DP and PT palpable bilaterally  Neuro: Grossly nonfocal  Data Reviewed: Basic Metabolic Panel:  Recent Labs Lab 02/28/14 0415 03/03/14 0440  NA 135* 137  K 4.3 4.7  CL 99 102  CO2 27 28  GLUCOSE 88 109*  BUN 16 7  CREATININE 0.75 0.58  CALCIUM 8.4 8.7   Liver Function Tests: No results for input(s): AST, ALT, ALKPHOS, BILITOT, PROT, ALBUMIN in the last 168 hours. No results for input(s): LIPASE, AMYLASE in the last 168 hours. No results for input(s): AMMONIA in the last 168 hours. CBC:  Recent Labs Lab 02/28/14 0415 03/01/14 0548 03/03/14 0440 03/06/14 0525  WBC 9.7 10.1 9.1 10.6*  NEUTROABS  --   --   --  7.1  HGB 10.8* 10.2* 10.4* 10.3*  HCT 32.1* 31.5* 31.9* 30.5*  MCV 90.7 92.6 91.4 88.2  PLT 154 147* 160 231   Cardiac Enzymes: No results for input(s): CKTOTAL, CKMB, CKMBINDEX, TROPONINI in the last 168 hours. BNP: Invalid input(s): POCBNP CBG:  Recent Labs Lab 03/05/14 0731 03/05/14 1229 03/05/14 1615  03/06/14 1227 03/06/14 1641  GLUCAP 121* 107* 120* 125* 123*    No results found for this or any previous visit (from the past 240 hour(s)).   Scheduled Meds: . antiseptic oral rinse  7 mL Mouth Rinse BID  . atenolol  100 mg Oral Daily  . diltiazem  120 mg Oral Q12H  . ertapenem  1 g Intravenous Q24H  . insulin aspart  0-9 Units Subcutaneous TID WC  . metFORMIN  500 mg Oral BID WC   Continuous Infusions:

## 2014-03-06 NOTE — Progress Notes (Signed)
Pharmacy protocol: Lovenox Indication: VTE prophylaxis  A: 39 yoF with bowel perforation 2/2 appendicitis or diverticulitis now s/p IR guided drain placement on 12/17.  Pharmacy consulted to start lovenox for VTE px.  No issues noted with renal fxn.  Hgb low, stable.  Plts WNL.    P:  Start lovenox 40mg  SQ q24h.    Ralene Bathe, PharmD, BCPS 03/06/2014, 7:18 PM  Pager: [phone]

## 2014-03-06 NOTE — Procedures (Signed)
Interventional Radiology Procedure Note  Procedure:   CT guided right lower quadrant abscess drained.  20F drain. Findings:  Frank pus.  85cc aspirated.  Sample sent to lab..  Complications: No immediate Recommendations:   - Routine drain care   Signed,  Dulcy Fanny. Earleen Newport, DO

## 2014-03-07 DIAGNOSIS — R1031 Right lower quadrant pain: Secondary | ICD-10-CM

## 2014-03-07 DIAGNOSIS — K5732 Diverticulitis of large intestine without perforation or abscess without bleeding: Secondary | ICD-10-CM

## 2014-03-07 DIAGNOSIS — K651 Peritoneal abscess: Secondary | ICD-10-CM

## 2014-03-07 LAB — BASIC METABOLIC PANEL
Anion gap: 11 (ref 5–15)
BUN: 6 mg/dL (ref 6–23)
CALCIUM: 9.1 mg/dL (ref 8.4–10.5)
CO2: 28 mEq/L (ref 19–32)
Chloride: 99 mEq/L (ref 96–112)
Creatinine, Ser: 0.66 mg/dL (ref 0.50–1.10)
GFR, EST NON AFRICAN AMERICAN: 80 mL/min — AB (ref >90)
Glucose, Bld: 96 mg/dL (ref 70–99)
Potassium: 4.4 mEq/L (ref 3.7–5.3)
Sodium: 138 mEq/L (ref 137–147)

## 2014-03-07 LAB — CBC
HCT: 30.6 % — ABNORMAL LOW (ref 36.0–46.0)
Hemoglobin: 10.2 g/dL — ABNORMAL LOW (ref 12.0–15.0)
MCH: 29.9 pg (ref 26.0–34.0)
MCHC: 33.3 g/dL (ref 30.0–36.0)
MCV: 89.7 fL (ref 78.0–100.0)
Platelets: 233 10*3/uL (ref 150–400)
RBC: 3.41 MIL/uL — ABNORMAL LOW (ref 3.87–5.11)
RDW: 14.4 % (ref 11.5–15.5)
WBC: 9.4 10*3/uL (ref 4.0–10.5)

## 2014-03-07 LAB — GLUCOSE, CAPILLARY
GLUCOSE-CAPILLARY: 101 mg/dL — AB (ref 70–99)
Glucose-Capillary: 117 mg/dL — ABNORMAL HIGH (ref 70–99)
Glucose-Capillary: 110 mg/dL — ABNORMAL HIGH (ref 70–99)

## 2014-03-07 NOTE — Progress Notes (Signed)
Nutrition Brief Note  RD consulted due to Poor PO intake. Pt reports that she has a good appetite- she ate 100% of her breakfast which was full liquids, and she ate 100% of lunch on the Soft diet.   Wt Readings from Last 15 Encounters:  03/07/14 158 lb 4.8 oz (71.804 kg)  12/25/13 163 lb (73.936 kg)  11/27/13 160 lb (72.576 kg)  09/17/13 173 lb 9.6 oz (78.744 kg)  06/25/13 182 lb (82.555 kg)  05/21/13 183 lb (83.008 kg)  03/22/13 184 lb 6.4 oz (83.643 kg)    Body mass index is 24.79 kg/(m^2). Patient meets criteria for Normal Weight based on current BMI. Patient reports that she has lost weight this past year despite eating well- she suspects weight loss is related to Metformin and diarrhea.  Current diet order is Soft Diet, patient is consuming approximately 75-100% of meals at this time.  Pt requested information regarding Soft diet.    RD provided "Fiber Restricted Nutrition Therapy" handout from the [employer]. Reviewed patient's dietary recall and discussed ways for pt to meet nutrition goals over the next several weeks. Explained reasons for pt to follow a low fiber diet over the next 2 weeks. Reviewed low fiber foods and high fiber foods. Discussed ways to gradually increased fiber in the diet.   Teach back method used. Pt verbalizes understanding of information provided.   Labs and medications reviewed.   No further nutrition interventions warranted at this time. If nutrition issues arise, please consult RD.   Pryor Ochoa RD, LDN Inpatient Clinical Dietitian Pager: [phone] After Hours Pager: [phone]

## 2014-03-07 NOTE — Progress Notes (Signed)
Subjective: Sore around drain site.  Bowels moving.  Objective: Vital signs in last 24 hours: Temp:  [98 F (36.7 C)-98.7 F (37.1 C)] 98.7 F (37.1 C) (12/18 0516) Pulse Rate:  [66-78] 71 (12/18 0516) Resp:  [18-22] 18 (12/18 0516) BP: (119-135)/(55-61) 135/61 mmHg (12/18 0516) SpO2:  [97 %-99 %] 98 % (12/18 0516) Last BM Date: 03/04/14  Intake/Output from previous day: 12/17 0701 - 12/18 0700 In: 70 [IV Piggyback:50] Out: 90 [Drains:90] Intake/Output this shift: Total I/O In: 120 [P.O.:120] Out: -   PE: General- In NAD Abdomen-soft, tender around drain site, serosanguinous drain output  Lab Results:   Recent Labs  03/06/14 0525 03/07/14 0448  WBC 10.6* 9.4  HGB 10.3* 10.2*  HCT 30.5* 30.6*  PLT 231 233   BMET  Recent Labs  03/07/14 0448  NA 138  K 4.4  CL 99  CO2 28  GLUCOSE 96  BUN 6  CREATININE 0.66  CALCIUM 9.1   PT/INR  Recent Labs  03/05/14 1315  LABPROT 13.9  INR 1.06   Comprehensive Metabolic Panel:    Component Value Date/Time   NA 138 03/07/2014 0448   NA 137 03/03/2014 0440   K 4.4 03/07/2014 0448   K 4.7 03/03/2014 0440   CL 99 03/07/2014 0448   CL 102 03/03/2014 0440   CO2 28 03/07/2014 0448   CO2 28 03/03/2014 0440   BUN 6 03/07/2014 0448   BUN 7 03/03/2014 0440   CREATININE 0.66 03/07/2014 0448   CREATININE 0.58 03/03/2014 0440   CREATININE 0.90 12/25/2013 1111   CREATININE 0.95 09/17/2013 1657   GLUCOSE 96 03/07/2014 0448   GLUCOSE 109* 03/03/2014 0440   CALCIUM 9.1 03/07/2014 0448   CALCIUM 8.7 03/03/2014 0440   AST 46* 02/27/2014 0455   AST 60* 02/26/2014 2006   ALT 47* 02/27/2014 0455   ALT 57* 02/26/2014 2006   ALKPHOS 177* 02/27/2014 0455   ALKPHOS 225* 02/26/2014 2006   BILITOT 0.4 02/27/2014 0455   BILITOT 0.4 02/26/2014 2006   PROT 6.9 02/27/2014 0455   PROT 7.6 02/26/2014 2006   ALBUMIN 3.0* 02/27/2014 0455   ALBUMIN 3.5 02/26/2014 2006     Studies/Results: Ct Abdomen Pelvis W  Contrast  03/05/2014   CLINICAL DATA:  Diverticulitis, lower abdominal pain, constipation, tubal ligation, cholecystectomy.  EXAM: CT ABDOMEN AND PELVIS WITH CONTRAST  TECHNIQUE: Multidetector CT imaging of the abdomen and pelvis was performed using the standard protocol following bolus administration of intravenous contrast.  CONTRAST:  190mL OMNIPAQUE IOHEXOL 300 MG/ML  SOLN  COMPARISON:  02/26/2014  FINDINGS: There is a well-defined collection right pelvis adjacent to the external iliac vessels and bordering on the posterior lateral margin of the cecum. This collection measures 7.7 cm x 3.8 cm x 4.5 cm. It contains nondependent air, but is mostly fluid filled. Wall is relatively thick and there is adjacent inflammatory change. There is a smaller collection inferiorly adjacent to the right inguinal ligament measuring 2.8 cm x 2 cm in greatest transverse dimensions.  The wall of the adjacent cecum is thickened vertically along its posterior lateral margin. There is a dilated tubular structure that extends from the posterior cecal tip along the posterior lateral margin of the cecum. It measures 10.5 mm in diameter. This is likely to reflect a dilated appendix.  The small bubbles of free air noted on the prior study are no longer evident.  Heart is mildly enlarged. There is mild chronic bronchiectasis with associated scarring in the right  lower lobe, stable.  Chronic dilation of the intra and extrahepatic biliary tree is noted. Common bile duct measures a maximum of 12 mm. There is also dilation of the cystic duct remnant in this patient has had a cholecystectomy. These findings are stable.  No liver mass or focal lesion. Spleen is unremarkable. No pancreatic mass or inflammatory change. No adrenal masses. Low-density, nonenhancing right renal lesions are noted consistent with cysts. These are stable. Mild dilation of both renal pelvises and portions of the ureters. This is most likely due to depressed peristalsis  due to the pelvic inflammation. No ureteral stone. Bladder is unremarkable.  No pathologically enlarged lymph nodes.  IMPRESSION: 1. Two adjacent collections have developed in the right pelvis since the prior exam. The largest measures 7.7 cm in greatest dimension. It contains fluid and nondependent air. The smaller collection lies below this near the inguinal ligament measuring 2.8 cm in greatest dimension. The larger collection borders on the posterior lateral cecum. It also lies adjacent to a dilated tubular structure that appears to be blind-ending. This is concerning for a dilated appendix. There is surrounding inflammatory change. Findings are most suspicious for acute appendicitis with rupture and abscess formation. 2. Free air noted on the prior CT is no longer evident. 3. No other acute findings.   Electronically Signed   By: Lajean Manes M.D.   On: 03/05/2014 11:01   Ct Image Guided Drainage By Percutaneous Catheter  03/06/2014   CLINICAL DATA:  80 year old female hospitalized for right lower quadrant pain and abscess on CT imaging. She has been referred for percutaneous drainage.  EXAM: CT GUIDED DRAINAGE OF PELVIC ABSCESS  ANESTHESIA/SEDATION: 4.0 Mg IV Versed 50 mcg IV Fentanyl  Total Moderate Sedation Time:  20 minutes  PROCEDURE: The procedure, risks, benefits, and alternatives were explained to the patient. Questions regarding the procedure were encouraged and answered. The patient understands and consents to the procedure.  Scout CT of the pelvis was acquired for planning purposes.  The right lower quadrant was prepped with Betadinein a sterile fashion, and a sterile drape was applied covering the operative field. A sterile gown and sterile gloves were used for the procedure. Local anesthesia was provided with 1% Lidocaine.  Once the patient is prepped and draped sterilely and the skin and subcutaneous tissues were generously infiltrated with 1% lidocaine for local anesthesia, a fine needle was  used to identify safe approach into the right lower quadrant/ pelvic abscess.  A small stab incision was made with 11 blade scalpel, and a trocar needle was advanced under CT guidance into the collection. Once we confirmed position, the stylet was removed and small amount of purulent fluid was aspirated to confirm. An 035 Amplatz wire was advanced to the needle and the needle was removed. Serial dilation of the tract with 8 [place] and 10 French drains was performed. Finally, a 25 French pigtail catheter was advanced over the wire into the abscess.  Approximately 85 cc of frank pus was aspirated. A sample was sent to lab.  The catheter was sutured in position with a 2-0 Prolene suture. A drainage bag was attached.  The patient tolerated the procedure well and remained hemodynamically stable throughout.  No complications were encountered and no significant blood loss was encountered.  COMPLICATIONS: None  FINDINGS: Initial CT demonstrates fluid and gas collection in the right lower quadrant, compatible with abscess and present on the comparison.  Images during the case demonstrate safe placement of 12 French drain into the  collection.  Final CT image demonstrates collapse of the cavity after aspiration of 85 cc of frank pus.  IMPRESSION: Status post CT-guided drainage of right lower quadrant abscess. A 12 French pigtail catheter was placed.  85 cc of frank pus was aspirated with a sample sent to the lab for culture.  Signed,  Dulcy Fanny. Earleen Newport, DO  [facility]  [facility]   Electronically Signed   By: Corrie Mckusick D.O.   On: 03/06/2014 11:30    Anti-infectives: Anti-infectives    Start     Dose/Rate Route Frequency Ordered Stop   02/27/14 2200  ertapenem (INVANZ) 1 g in sodium chloride 0.9 % 50 mL IVPB     1 g100 mL/hr over 30 Minutes Intravenous Every 24 hours 02/27/14 0341     02/27/14 0045  ertapenem (INVANZ) 1 g in sodium chloride 0.9 % 50 mL IVPB     1 g100  mL/hr over 30 Minutes Intravenous  Once 02/27/14 0031 02/27/14 0134   02/26/14 2300  piperacillin-tazobactam (ZOSYN) IVPB 3.375 g  Status:  Discontinued     3.375 g12.5 mL/hr over 240 Minutes Intravenous  Once 02/26/14 2256 02/26/14 2319   02/26/14 2245  ciprofloxacin (CIPRO) IVPB 400 mg  Status:  Discontinued     400 mg200 mL/hr over 60 Minutes Intravenous  Once 02/26/14 2244 02/26/14 2255   02/26/14 2245  metroNIDAZOLE (FLAGYL) IVPB 500 mg  Status:  Discontinued     500 mg100 mL/hr over 60 Minutes Intravenous  Once 02/26/14 2244 02/26/14 2255      Assessment Perforated appendicitis with abscess s/p percutaneous drain placement-GPC and GNR on gram stain with identification and sensitivity pending.  LOS: 9 days   Plan: Advance to soft diet, continue IV abxs and switch to oral once sensitivities known.  Ambulate.   Kristine Burnett 03/07/2014

## 2014-03-07 NOTE — Progress Notes (Signed)
Physical Therapy Treatment Patient Details Name: FONTAINE KOSSMAN MRN: [id number] DOB: 1929-12-05 Today's Date: 03/07/2014    History of Present Illness 80 y.o. female with history of diabetes mellitus, spinal stenosis, hypertension and hyperlipidemia presented to the ER because of abdominal painbowel perforation, s/;p drain placement 03/06/14    PT Comments    *Pt progressing well with mobility, she walked 54' with RW with supervision, no LOB. **  Follow Up Recommendations  No PT follow up     Equipment Recommendations  None recommended by PT    Recommendations for Other Services       Precautions / Restrictions Precautions Precautions: Fall Restrictions Weight Bearing Restrictions: No    Mobility  Bed Mobility               General bed mobility comments: pt OOB in recliner  Transfers Overall transfer level: Needs assistance Equipment used: Rolling walker (2 wheeled) Transfers: Sit to/from Stand Sit to Stand: Modified independent (Device/Increase time)         General transfer comment: used armrests   Ambulation/Gait Ambulation/Gait assistance: Supervision Ambulation Distance (Feet): 370 Feet Assistive device: Rolling walker (2 wheeled) Gait Pattern/deviations: Trunk flexed;Step-through pattern Gait velocity: WFL Gait velocity interpretation: Below normal speed for age/gender General Gait Details: steady with RW, trunk forward flexed likely due to h/o spinal stenosis, pt denied back pain with walking   Stairs            Wheelchair Mobility    Modified Rankin (Stroke Patients Only)       Balance Overall balance assessment: Modified Independent                                  Cognition Arousal/Alertness: Awake/alert Behavior During Therapy: WFL for tasks assessed/performed Overall Cognitive Status: Within Functional Limits for tasks assessed                      Exercises      General Comments         Pertinent Vitals/Pain Pain Assessment: No/denies pain    Home Living                      Prior Function            PT Goals (current goals can now be found in the care plan section) Acute Rehab PT Goals Patient Stated Goal: Home PT Goal Formulation: With patient Time For Goal Achievement: 03/16/14 Potential to Achieve Goals: Good Progress towards PT goals: Progressing toward goals    Frequency  Min 3X/week    PT Plan Current plan remains appropriate    Co-evaluation             End of Session Equipment Utilized During Treatment: Gait belt Activity Tolerance: Patient tolerated treatment well Patient left: in chair;with call bell/phone within reach;with family/visitor present     Time: 4098-1191 PT Time Calculation (min) (ACUTE ONLY): 24 min  Charges:  $Gait Training: 23-37 mins                    G Codes:      Blondell Reveal Kistler 03/07/2014, 11:06 AM [phone]

## 2014-03-07 NOTE — Consult Note (Signed)
Patient evaluated for [place] [employer] services on behalf of her [facility] insurance. Met with patient at bedside explain the program. Patient states her daughter, who stays with her at night, will look over the [place] [employer] brochure and contact writer if she thinks [place] [employer] services are warranted. She did not want writer to contact daughter at this time as she reports her daughter is at work and will be in to see her later on today. Appreciative of visit however. Will make make inpatient RNCM aware.  Marthenia Rolling, MSN- [facility] [phone]

## 2014-03-07 NOTE — Progress Notes (Signed)
Progress Note   Kristine Burnett FVC:[id number] DOB: 1929/06/26 DOA: 02/26/2014 PCP: Alesia Richards, MD   Brief Narrative:   Kristine Burnett is an 80 y.o. female with history of diabetes mellitus, hypertension and hyperlipidemia who presented to [facility] ED with complaints of lower abdominal pain radiating to bilateral sides for past 2 days prior to this admission. Patient reported some nausea but no vomiting or fevers. On admission, patient was hemodynamically stable. Evaluation included CT abdomen and pelvis which was concerning for bowel perforation. She did not require surgical intervention as she was getting better with IV fluids and ertapenem. Repeat CT abdomen showed RLQ abscess and dilated appendix. IR consulted for percutaneous drainage of abscess.   Assessment/Plan:    Principal Problem:  Bowel perforation / right lower quadrant abscess / leukocytosis  - CT abdomen and pelvis with contrast showed free fluid and small amount of free air in the abdomen suspicious for bowel perforation. Pt initially improved with IV abx. Repeat CT abdomen showed RLQ abscess and dilated appendix. IR consulted for percutaneous drainage of abscess, which was done 03/06/14.  - Patient is status post CT-guided drainage of right lower quadrant abscess 03/06/2014 and fluid sent for culture analysis. - Continue ertapenem. Abscess fluid cultures pending.  Active problems:  Hypokalemia - Secondary to GI losses. Potassium was supplemented through IV fluids.  - Potassium remains within normal limits.  Essential hypertension - Continue home antihypertensive medications: atenolol 100 mg daily and Cardizem 120 mg PO Q 12 hours   Diabetes mellitus, controlled with no complications - M3W in 46/6599 6.1 indicating good glycemic control - Continue metformin.  DVT Prophylaxis  - SCD's bilaterally   Code Status: Full.  Family Communication: Plan of care discussed with the patient and her daughter by  phone. Disposition Plan: Home when stable.    IV Access:    Peripheral IV   Procedures and diagnostic studies:   Ct Abdomen Pelvis W Contrast 02/26/2014 Free fluid and small amount of free air in the abdomen suspicious for bowel perforation. Cause not identified. Appendix is not visualized but inflammatory changes are present in the right lower quadrant, possibly indicating perforated appendicitis. 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 Vascular compromise to the bowel is not excluded. Surgical absence of the gallbladder with prominent intra and extrahepatic bile duct dilatation. This is likely postoperative but occult noncalcified common duct stone is not excluded.   Ct Abdomen Pelvis W Contrast 03/05/2014 1. Two adjacent collections have developed in the right pelvis since the prior exam. The largest measures 7.7 cm in greatest dimension. It contains fluid and nondependent air. The smaller collection lies below this near the inguinal ligament measuring 2.8 cm in greatest dimension. The larger collection borders on the posterior lateral cecum. It also lies adjacent to a dilated tubular structure that appears to be blind-ending. This is concerning for a dilated appendix. There is surrounding inflammatory change. Findings are most suspicious for acute appendicitis with rupture and abscess formation. 2. Free air noted on the prior CT is no longer evident. 3. No other acute findings.   Ct Image Guided Drainage By Percutaneous Catheter 03/06/2014 Status post CT-guided drainage of right lower quadrant abscess. A 12 French pigtail catheter was placed. 85 cc of frank pus was aspirated with a sample sent to the lab for culture.    Medical Consultants:    Surgery   Interventional radiology   Anti-Infectives:    Ertapenem  02/27/2014 -->  Subjective:   Kristine Burnett feels well. Her bowels moved yesterday. Tolerating her diet.  Sitting up in the chair. No current complaints of pain, nausea, vomiting or diarrhea.  Objective:    Filed Vitals:   03/06/14 0903 03/06/14 1432 03/06/14 2121 03/07/14 0516  BP: 143/60 119/55 132/56 135/61  Pulse: 76 66 78 71  Temp:  98 F (36.7 C) 98.7 F (37.1 C) 98.7 F (37.1 C)  TempSrc:  Oral Oral Oral  Resp: 24 18 22 18   Height:      Weight:      SpO2: 100% 99% 97% 98%    Intake/Output Summary (Last 24 hours) at 03/07/14 0837 Last data filed at 03/07/14 0826  Gross per 24 hour  Intake    190 ml  Output     90 ml  Net    100 ml    Exam: Gen:  NAD Cardiovascular:  RRR, No M/R/G Respiratory:  Lungs CTAB Gastrointestinal:  Abdomen soft, NT/ND, + BS Extremities:  No C/E/C   Data Reviewed:    Labs: Basic Metabolic Panel:  Recent Labs Lab 03/03/14 0440 03/07/14 0448  NA 137 138  K 4.7 4.4  CL 102 99  CO2 28 28  GLUCOSE 109* 96  BUN 7 6  CREATININE 0.58 0.66  CALCIUM 8.7 9.1   GFR Estimated Creatinine Clearance: 52.7 mL/min (by C-G formula based on Cr of 0.66). Coagulation profile  Recent Labs Lab 03/05/14 1315  INR 1.06    CBC:  Recent Labs Lab 03/01/14 0548 03/03/14 0440 03/06/14 0525 03/07/14 0448  WBC 10.1 9.1 10.6* 9.4  NEUTROABS  --   --  7.1  --   HGB 10.2* 10.4* 10.3* 10.2*  HCT 31.5* 31.9* 30.5* 30.6*  MCV 92.6 91.4 88.2 89.7  PLT 147* 160 231 233   CBG:  Recent Labs Lab 03/05/14 1229 03/05/14 1615 03/06/14 1227 03/06/14 1641 03/07/14 0740  GLUCAP 107* 120* 125* 123* 101*   Microbiology Recent Results (from the past 240 hour(s))  Culture, routine-abscess     Status: None (Preliminary result)   Collection Time: 03/06/14  9:10 AM  Result Value Ref Range Status   Specimen Description ABSCESS  Final   Special Requests NONE  Final   Gram Stain   Final    ABUNDANT WBC PRESENT,BOTH PMN AND MONONUCLEAR NO SQUAMOUS EPITHELIAL CELLS SEEN FEW GRAM POSITIVE COCCI IN PAIRS IN CLUSTERS FEW GRAM NEGATIVE RODS Performed at  [facility]    Culture   Final    Culture reincubated for better growth Performed at [facility]    Report Status PENDING  Incomplete  Anaerobic culture     Status: None (Preliminary result)   Collection Time: 03/06/14  9:10 AM  Result Value Ref Range Status   Specimen Description ABSCESS  Final   Special Requests NONE  Final   Gram Stain   Final    ABUNDANT WBC PRESENT,BOTH PMN AND MONONUCLEAR NO SQUAMOUS EPITHELIAL CELLS SEEN FEW GRAM POSITIVE COCCI IN PAIRS IN CLUSTERS FEW GRAM NEGATIVE RODS Performed at [facility]    Culture PENDING  Incomplete   Report Status PENDING  Incomplete     Medications:   . antiseptic oral rinse  7 mL Mouth Rinse BID  . atenolol  100 mg Oral Daily  . diltiazem  120 mg Oral Q12H  . enoxaparin (LOVENOX) injection  40 mg Subcutaneous Q24H  . ertapenem  1 g Intravenous Q24H  . insulin aspart  0-9 Units Subcutaneous TID WC  . metFORMIN  500 mg Oral BID WC   Continuous Infusions:   Time spent: 25 minutes.   LOS: 9 days   [facility] Pager [phone]. If unable to reach me by pager, please call my cell phone at [phone].  *Please refer to [email or web address], password [email or web address] to get updated schedule on who will round on this patient, as hospitalists switch teams weekly. If 7PM-7AM, please contact night-coverage at [email or web address], password [email or web address] for any overnight needs.  03/07/2014, 8:37 AM

## 2014-03-07 NOTE — Progress Notes (Signed)
Subjective: Pt doing well; has some soreness at RLQ drain as expected; just finished ambulating  Objective: Vital signs in last 24 hours: Temp:  [98 F (36.7 C)-98.7 F (37.1 C)] 98.7 F (37.1 C) (12/18 0516) Pulse Rate:  [66-78] 71 (12/18 0516) Resp:  [18-22] 18 (12/18 0516) BP: (119-135)/(55-61) 135/61 mmHg (12/18 0516) SpO2:  [97 %-99 %] 98 % (12/18 0516) Weight:  [158 lb 4.8 oz (71.804 kg)] 158 lb 4.8 oz (71.804 kg) (12/18 1126) Last BM Date: 03/04/14  Intake/Output from previous day: 12/17 0701 - 12/18 0700 In: 19 [IV Piggyback:50] Out: 90 [Drains:90] Intake/Output this shift: Total I/O In: 365 [P.O.:360; Other:5] Out: 7 [Drains:7]  RLQ drain intact, output 90 cc's yesterday, about 10 cc's today turbid, blood-tinged fluid; cx's pending; drain irrigated with sterile NS without difficulty; site mild-mod tender to palpation  Lab Results:   Recent Labs  03/06/14 0525 03/07/14 0448  WBC 10.6* 9.4  HGB 10.3* 10.2*  HCT 30.5* 30.6*  PLT 231 233   BMET  Recent Labs  03/07/14 0448  NA 138  K 4.4  CL 99  CO2 28  GLUCOSE 96  BUN 6  CREATININE 0.66  CALCIUM 9.1   PT/INR  Recent Labs  03/05/14 1315  LABPROT 13.9  INR 1.06   ABG No results for input(s): PHART, HCO3 in the last 72 hours.  Invalid input(s): PCO2, PO2  Studies/Results: Ct Image Guided Drainage By Percutaneous Catheter  03/06/2014   CLINICAL DATA:  80 year old female hospitalized for right lower quadrant pain and abscess on CT imaging. She has been referred for percutaneous drainage.  EXAM: CT GUIDED DRAINAGE OF PELVIC ABSCESS  ANESTHESIA/SEDATION: 4.0 Mg IV Versed 50 mcg IV Fentanyl  Total Moderate Sedation Time:  20 minutes  PROCEDURE: The procedure, risks, benefits, and alternatives were explained to the patient. Questions regarding the procedure were encouraged and answered. The patient understands and consents to the procedure.  Scout CT of the pelvis was acquired for planning purposes.   The right lower quadrant was prepped with Betadinein a sterile fashion, and a sterile drape was applied covering the operative field. A sterile gown and sterile gloves were used for the procedure. Local anesthesia was provided with 1% Lidocaine.  Once the patient is prepped and draped sterilely and the skin and subcutaneous tissues were generously infiltrated with 1% lidocaine for local anesthesia, a fine needle was used to identify safe approach into the right lower quadrant/ pelvic abscess.  A small stab incision was made with 11 blade scalpel, and a trocar needle was advanced under CT guidance into the collection. Once we confirmed position, the stylet was removed and small amount of purulent fluid was aspirated to confirm. An 035 Amplatz wire was advanced to the needle and the needle was removed. Serial dilation of the tract with 8 [place] and 10 French drains was performed. Finally, a 26 French pigtail catheter was advanced over the wire into the abscess.  Approximately 85 cc of frank pus was aspirated. A sample was sent to lab.  The catheter was sutured in position with a 2-0 Prolene suture. A drainage bag was attached.  The patient tolerated the procedure well and remained hemodynamically stable throughout.  No complications were encountered and no significant blood loss was encountered.  COMPLICATIONS: None  FINDINGS: Initial CT demonstrates fluid and gas collection in the right lower quadrant, compatible with abscess and present on the comparison.  Images during the case demonstrate safe placement of 12 French drain into the  collection.  Final CT image demonstrates collapse of the cavity after aspiration of 85 cc of frank pus.  IMPRESSION: Status post CT-guided drainage of right lower quadrant abscess. A 12 French pigtail catheter was placed.  85 cc of frank pus was aspirated with a sample sent to the lab for culture.  Signed,  Dulcy Fanny. Earleen Newport, DO  [facility]   [facility]   Electronically Signed   By: Corrie Mckusick D.O.   On: 03/06/2014 11:30    Anti-infectives: Anti-infectives    Start     Dose/Rate Route Frequency Ordered Stop   02/27/14 2200  ertapenem (INVANZ) 1 g in sodium chloride 0.9 % 50 mL IVPB     1 g100 mL/hr over 30 Minutes Intravenous Every 24 hours 02/27/14 0341     02/27/14 0045  ertapenem (INVANZ) 1 g in sodium chloride 0.9 % 50 mL IVPB     1 g100 mL/hr over 30 Minutes Intravenous  Once 02/27/14 0031 02/27/14 0134   02/26/14 2300  piperacillin-tazobactam (ZOSYN) IVPB 3.375 g  Status:  Discontinued     3.375 g12.5 mL/hr over 240 Minutes Intravenous  Once 02/26/14 2256 02/26/14 2319   02/26/14 2245  ciprofloxacin (CIPRO) IVPB 400 mg  Status:  Discontinued     400 mg200 mL/hr over 60 Minutes Intravenous  Once 02/26/14 2244 02/26/14 2255   02/26/14 2245  metroNIDAZOLE (FLAGYL) IVPB 500 mg  Status:  Discontinued     500 mg100 mL/hr over 60 Minutes Intravenous  Once 02/26/14 2244 02/26/14 2255      Assessment/Plan: s/p RLQ abscess drainage 12/17; WBC nl; check final cx's ; cont NS drain irrigation ; check f/u CT within 1 week of placement; other plans as per [facility].  LOS: 9 days    Ruthell Feigenbaum,D [facility] 03/07/2014

## 2014-03-08 LAB — GLUCOSE, CAPILLARY
Glucose-Capillary: 55 mg/dL — ABNORMAL LOW (ref 70–99)
Glucose-Capillary: 128 mg/dL — ABNORMAL HIGH (ref 70–99)
Glucose-Capillary: 101 mg/dL — ABNORMAL HIGH (ref 70–99)
Glucose-Capillary: 110 mg/dL — ABNORMAL HIGH (ref 70–99)

## 2014-03-08 NOTE — Progress Notes (Signed)
Subjective: Still sore around drain site.  Bowels moving. Eating.  Objective: Vital signs in last 24 hours: Temp:  [98 F (36.7 C)-98.5 F (36.9 C)] 98 F (36.7 C) (12/19 0542) Pulse Rate:  [67-85] 70 (12/19 0542) Resp:  [20] 20 (12/19 0542) BP: (126-157)/(62-69) 149/62 mmHg (12/19 0542) SpO2:  [100 %] 100 % (12/19 0542) Weight:  [158 lb 4.8 oz (71.804 kg)] 158 lb 4.8 oz (71.804 kg) (12/18 1126) Last BM Date: 03/06/14  Intake/Output from previous day: 12/18 0701 - 12/19 0700 In: 605 [P.O.:600] Out: 7 [Drains:7] Intake/Output this shift:    PE: General- In NAD Abdomen-soft, not tender, serosanguinous drain output  Lab Results:   Recent Labs  03/06/14 0525 03/07/14 0448  WBC 10.6* 9.4  HGB 10.3* 10.2*  HCT 30.5* 30.6*  PLT 231 233   BMET  Recent Labs  03/07/14 0448  NA 138  K 4.4  CL 99  CO2 28  GLUCOSE 96  BUN 6  CREATININE 0.66  CALCIUM 9.1   PT/INR  Recent Labs  03/05/14 1315  LABPROT 13.9  INR 1.06   Comprehensive Metabolic Panel:    Component Value Date/Time   NA 138 03/07/2014 0448   NA 137 03/03/2014 0440   K 4.4 03/07/2014 0448   K 4.7 03/03/2014 0440   CL 99 03/07/2014 0448   CL 102 03/03/2014 0440   CO2 28 03/07/2014 0448   CO2 28 03/03/2014 0440   BUN 6 03/07/2014 0448   BUN 7 03/03/2014 0440   CREATININE 0.66 03/07/2014 0448   CREATININE 0.58 03/03/2014 0440   CREATININE 0.90 12/25/2013 1111   CREATININE 0.95 09/17/2013 1657   GLUCOSE 96 03/07/2014 0448   GLUCOSE 109* 03/03/2014 0440   CALCIUM 9.1 03/07/2014 0448   CALCIUM 8.7 03/03/2014 0440   AST 46* 02/27/2014 0455   AST 60* 02/26/2014 2006   ALT 47* 02/27/2014 0455   ALT 57* 02/26/2014 2006   ALKPHOS 177* 02/27/2014 0455   ALKPHOS 225* 02/26/2014 2006   BILITOT 0.4 02/27/2014 0455   BILITOT 0.4 02/26/2014 2006   PROT 6.9 02/27/2014 0455   PROT 7.6 02/26/2014 2006   ALBUMIN 3.0* 02/27/2014 0455   ALBUMIN 3.5 02/26/2014 2006     Studies/Results: Ct Image  Guided Drainage By Percutaneous Catheter  03/06/2014   CLINICAL DATA:  80 year old female hospitalized for right lower quadrant pain and abscess on CT imaging. She has been referred for percutaneous drainage.  EXAM: CT GUIDED DRAINAGE OF PELVIC ABSCESS  ANESTHESIA/SEDATION: 4.0 Mg IV Versed 50 mcg IV Fentanyl  Total Moderate Sedation Time:  20 minutes  PROCEDURE: The procedure, risks, benefits, and alternatives were explained to the patient. Questions regarding the procedure were encouraged and answered. The patient understands and consents to the procedure.  Scout CT of the pelvis was acquired for planning purposes.  The right lower quadrant was prepped with Betadinein a sterile fashion, and a sterile drape was applied covering the operative field. A sterile gown and sterile gloves were used for the procedure. Local anesthesia was provided with 1% Lidocaine.  Once the patient is prepped and draped sterilely and the skin and subcutaneous tissues were generously infiltrated with 1% lidocaine for local anesthesia, a fine needle was used to identify safe approach into the right lower quadrant/ pelvic abscess.  A small stab incision was made with 11 blade scalpel, and a trocar needle was advanced under CT guidance into the collection. Once we confirmed position, the stylet was removed and small amount of  purulent fluid was aspirated to confirm. An 035 Amplatz wire was advanced to the needle and the needle was removed. Serial dilation of the tract with 8 [place] and 10 French drains was performed. Finally, a 5 French pigtail catheter was advanced over the wire into the abscess.  Approximately 85 cc of frank pus was aspirated. A sample was sent to lab.  The catheter was sutured in position with a 2-0 Prolene suture. A drainage bag was attached.  The patient tolerated the procedure well and remained hemodynamically stable throughout.  No complications were encountered and no significant blood loss was encountered.   COMPLICATIONS: None  FINDINGS: Initial CT demonstrates fluid and gas collection in the right lower quadrant, compatible with abscess and present on the comparison.  Images during the case demonstrate safe placement of 12 French drain into the collection.  Final CT image demonstrates collapse of the cavity after aspiration of 85 cc of frank pus.  IMPRESSION: Status post CT-guided drainage of right lower quadrant abscess. A 12 French pigtail catheter was placed.  85 cc of frank pus was aspirated with a sample sent to the lab for culture.  Signed,  Dulcy Fanny. Earleen Newport, DO  [facility]  [facility]   Electronically Signed   By: Corrie Mckusick D.O.   On: 03/06/2014 11:30    Anti-infectives: Anti-infectives    Start     Dose/Rate Route Frequency Ordered Stop   02/27/14 2200  ertapenem (INVANZ) 1 g in sodium chloride 0.9 % 50 mL IVPB     1 g100 mL/hr over 30 Minutes Intravenous Every 24 hours 02/27/14 0341     02/27/14 0045  ertapenem (INVANZ) 1 g in sodium chloride 0.9 % 50 mL IVPB     1 g100 mL/hr over 30 Minutes Intravenous  Once 02/27/14 0031 02/27/14 0134   02/26/14 2300  piperacillin-tazobactam (ZOSYN) IVPB 3.375 g  Status:  Discontinued     3.375 g12.5 mL/hr over 240 Minutes Intravenous  Once 02/26/14 2256 02/26/14 2319   02/26/14 2245  ciprofloxacin (CIPRO) IVPB 400 mg  Status:  Discontinued     400 mg200 mL/hr over 60 Minutes Intravenous  Once 02/26/14 2244 02/26/14 2255   02/26/14 2245  metroNIDAZOLE (FLAGYL) IVPB 500 mg  Status:  Discontinued     500 mg100 mL/hr over 60 Minutes Intravenous  Once 02/26/14 2244 02/26/14 2255      Assessment Perforated appendicitis with abscess s/p percutaneous drain placement-GPC and GNR on gram stain with identification and sensitivity still pending; on InVanz; doing well overall; walked okay yesterday.  Rec:  Can switch to oral antibiotics that would cover GNR, anaerobes and MRSA.  Leave drain in.  Repeat CT 5 days  after drainage procedure and if abscess is resolved do injection through drain to make sure she does not have a fistula, if not, drain could be removed.  Follow up with Dr. Harlow Asa or myself in 2 weeks.  Will see again Monday if she is still here.  LOS: 10 days      Geeta Dworkin J 03/08/2014

## 2014-03-08 NOTE — Progress Notes (Signed)
Progress Note   Kristine Burnett LEX:[id number] DOB: [birth year] DOA: 02/26/2014 PCP: Alesia Richards, MD   Brief Narrative:   Kristine Burnett is an 80 y.o. female with history of diabetes mellitus, hypertension and hyperlipidemia who presented to [facility] ED with complaints of lower abdominal pain radiating to bilateral sides for past 2 days prior to this admission. Patient reported some nausea but no vomiting or fevers. On admission, patient was hemodynamically stable. Evaluation included CT abdomen and pelvis which was concerning for bowel perforation. She did not require surgical intervention as she was getting better with IV fluids and ertapenem. Repeat CT abdomen showed RLQ abscess and dilated appendix. IR consulted for percutaneous drainage of abscess.   Assessment/Plan:    Principal Problem:  Bowel perforation / right lower quadrant abscess / leukocytosis  - CT abdomen and pelvis with contrast showed free fluid and small amount of free air in the abdomen suspicious for bowel perforation. Pt initially improved with IV abx. Repeat CT abdomen showed RLQ abscess and dilated appendix. IR consulted for percutaneous drainage of abscess, which was done 03/06/14.  - Patient is status post CT-guided drainage of right lower quadrant abscess 03/06/2014 and fluid sent for culture analysis. - Continue ertapenem. Abscess fluid cultures pending.  Active problems:  Hypokalemia - Secondary to GI losses. Potassium was supplemented through IV fluids.  - Potassium remains within normal limits.  Essential hypertension - Continue home antihypertensive medications: atenolol 100 mg daily and Cardizem 120 mg PO Q 12 hours   Diabetes mellitus, controlled with no complications - S4H in 67/5916 6.1 indicating good glycemic control - Continue metformin.  DVT Prophylaxis  - SCD's bilaterally   Code Status: Full.  Family Communication: Plan of care discussed with the patient and her daughter by  phone 03/07/14. Disposition Plan: Home when stable.    IV Access:    Peripheral IV   Procedures and diagnostic studies:   Ct Abdomen Pelvis W Contrast 02/26/2014 Free fluid and small amount of free air in the abdomen suspicious for bowel perforation. Cause not identified. Appendix is not visualized but inflammatory changes are present in the right lower quadrant, possibly indicating perforated appendicitis. 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 Vascular compromise to the bowel is not excluded. Surgical absence of the gallbladder with prominent intra and extrahepatic bile duct dilatation. This is likely postoperative but occult noncalcified common duct stone is not excluded.   Ct Abdomen Pelvis W Contrast 03/05/2014 1. Two adjacent collections have developed in the right pelvis since the prior exam. The largest measures 7.7 cm in greatest dimension. It contains fluid and nondependent air. The smaller collection lies below this near the inguinal ligament measuring 2.8 cm in greatest dimension. The larger collection borders on the posterior lateral cecum. It also lies adjacent to a dilated tubular structure that appears to be blind-ending. This is concerning for a dilated appendix. There is surrounding inflammatory change. Findings are most suspicious for acute appendicitis with rupture and abscess formation. 2. Free air noted on the prior CT is no longer evident. 3. No other acute findings.   Ct Image Guided Drainage By Percutaneous Catheter 03/06/2014 Status post CT-guided drainage of right lower quadrant abscess. A 12 French pigtail catheter was placed. 85 cc of frank pus was aspirated with a sample sent to the lab for culture.    Medical Consultants:    Surgery   Interventional radiology   Anti-Infectives:  Ertapenem 02/27/2014 -->  Subjective:   [name] feels well. No current complaints. Appetite good.  Bowels are moving. No nausea. Some soreness at the drain site, but otherwise no significant pain.  Objective:    Filed Vitals:   03/07/14 1442 03/07/14 2215 03/08/14 0542 03/08/14 1428  BP: 126/69 157/66 149/62 148/66  Pulse: 67 85 70 70  Temp: 98.5 F (36.9 C) 98.1 F (36.7 C) 98 F (36.7 C) 97.9 F (36.6 C)  TempSrc: Oral Oral Oral Oral  Resp: 20 20 20 18   Height:      Weight:      SpO2: 100% 100% 100% 100%    Intake/Output Summary (Last 24 hours) at 03/08/14 1525 Last data filed at 03/08/14 1000  Gross per 24 hour  Intake    490 ml  Output      0 ml  Net    490 ml    Exam: Gen:  NAD Cardiovascular:  RRR, No M/R/G Respiratory:  Lungs CTAB Gastrointestinal:  Abdomen soft, NT/ND, + BS Extremities:  No C/E/C   Data Reviewed:    Labs: Basic Metabolic Panel:  Recent Labs Lab 03/03/14 0440 03/07/14 0448  NA 137 138  K 4.7 4.4  CL 102 99  CO2 28 28  GLUCOSE 109* 96  BUN 7 6  CREATININE 0.58 0.66  CALCIUM 8.7 9.1   GFR Estimated Creatinine Clearance: 52.7 mL/min (by C-G formula based on Cr of 0.66). Coagulation profile  Recent Labs Lab 03/05/14 1315  INR 1.06    CBC:  Recent Labs Lab 03/03/14 0440 03/06/14 0525 03/07/14 0448  WBC 9.1 10.6* 9.4  NEUTROABS  --  7.1  --   HGB 10.4* 10.3* 10.2*  HCT 31.9* 30.5* 30.6*  MCV 91.4 88.2 89.7  PLT 160 231 233   CBG:  Recent Labs Lab 03/07/14 1139 03/07/14 1646 03/08/14 0749 03/08/14 1006 03/08/14 1127  GLUCAP 117* 110* 64* 128* 101*   Microbiology Recent Results (from the past 240 hour(s))  Culture, routine-abscess     Status: None (Preliminary result)   Collection Time: 03/06/14  9:10 AM  Result Value Ref Range Status   Specimen Description ABSCESS  Final   Special Requests NONE  Final   Gram Stain   Final    ABUNDANT WBC PRESENT,BOTH PMN AND MONONUCLEAR NO SQUAMOUS EPITHELIAL CELLS SEEN FEW GRAM POSITIVE COCCI IN PAIRS IN CLUSTERS FEW GRAM NEGATIVE RODS Performed at [facility]    Culture   Final    Culture reincubated for better growth Performed at [facility]    Report Status PENDING  Incomplete  Anaerobic culture     Status: None (Preliminary result)   Collection Time: 03/06/14  9:10 AM  Result Value Ref Range Status   Specimen Description ABSCESS  Final   Special Requests NONE  Final   Gram Stain   Final    ABUNDANT WBC PRESENT,BOTH PMN AND MONONUCLEAR NO SQUAMOUS EPITHELIAL CELLS SEEN FEW GRAM POSITIVE COCCI IN PAIRS IN CLUSTERS FEW GRAM NEGATIVE RODS Performed at [facility]    Culture   Final    NO ANAEROBES ISOLATED; CULTURE IN PROGRESS FOR 5 DAYS Performed at [facility]    Report Status PENDING  Incomplete     Medications:   . antiseptic oral rinse  7 mL Mouth Rinse BID  . atenolol  100 mg Oral Daily  . diltiazem  120 mg Oral Q12H  . enoxaparin (LOVENOX) injection  40 mg Subcutaneous  Q24H  . ertapenem  1 g Intravenous Q24H  . insulin aspart  0-9 Units Subcutaneous TID WC  . metFORMIN  500 mg Oral BID WC   Continuous Infusions:   Time spent: 25 minutes.   LOS: 10 days   [facility] Pager [phone]. If unable to reach me by pager, please call my cell phone at [phone].  *Please refer to [email or web address], password [email or web address] to get updated schedule on who will round on this patient, as hospitalists switch teams weekly. If 7PM-7AM, please contact night-coverage at [email or web address], password [email or web address] for any overnight needs.  03/08/2014, 3:25 PM

## 2014-03-09 LAB — GLUCOSE, CAPILLARY
GLUCOSE-CAPILLARY: 83 mg/dL (ref 70–99)
Glucose-Capillary: 106 mg/dL — ABNORMAL HIGH (ref 70–99)

## 2014-03-09 MED ORDER — CIPROFLOXACIN HCL 500 MG PO TABS: 500.0000 mg | ORAL_TABLET | Freq: Two times a day (BID) | ORAL | Status: DC

## 2014-03-09 MED ORDER — METRONIDAZOLE 500 MG PO TABS
500.0000 mg | ORAL_TABLET | Freq: Three times a day (TID) | ORAL | Status: DC
  Administered 2014-03-09: 500 mg via ORAL
  Filled 2014-03-09 (×3): qty 1

## 2014-03-09 MED ORDER — CIPROFLOXACIN IN D5W 400 MG/200ML IV SOLN
400.0000 mg | Freq: Two times a day (BID) | INTRAVENOUS | Status: DC
  Filled 2014-03-09: qty 200

## 2014-03-09 MED ORDER — CIPROFLOXACIN HCL 500 MG PO TABS
500.0000 mg | ORAL_TABLET | Freq: Two times a day (BID) | ORAL | Status: DC
  Administered 2014-03-09: 500 mg via ORAL
  Filled 2014-03-09 (×3): qty 1

## 2014-03-09 MED ORDER — ACETAMINOPHEN 325 MG PO TABS: 650.0000 mg | ORAL_TABLET | Freq: Four times a day (QID) | ORAL | Status: DC | PRN

## 2014-03-09 MED ORDER — METRONIDAZOLE 500 MG PO TABS: 500.0000 mg | ORAL_TABLET | Freq: Three times a day (TID) | ORAL | Status: DC

## 2014-03-09 NOTE — Care Management (Signed)
CARE MANAGEMENT NOTE 03/09/2014  Patient:  Kristine Burnett, Kristine Burnett   Account Number:  [id number]  Date Initiated:  02/28/2014  Documentation initiated by:  Edwyna Shell  Subjective/Objective Assessment:   80 yo female admitted with bowel perforation from home, has family support and uses walker     Action/Plan:   discharge planning   Anticipated DC Date:  03/02/2014   Anticipated DC Plan:  [place]  CM consult      Choice offered to / List presented to:  C-1 Patient        [facility] arranged  HH-2 PT      [facility].   Status of service:  Completed, signed off Medicare Important Message given?  YES (If response is "NO", the following Medicare IM given date fields will be blank) Date Medicare IM given:  03/07/2014 Medicare IM given by:  Edwyna Shell Date Additional Medicare IM given:   Additional Medicare IM given by:    Discharge Disposition:  [place]  Per UR Regulation:    If discussed at Long Length of Stay Meetings, dates discussed:    Comments:  03/09/14 - Spoke with patient. Would like [facility]. [facility] notified of discharge scheduled for today. Venita Sheffield RN BSN CCM [phone]  03/07/14 Edwyna Shell RN BSN CM [phone] Patient and daughter feel that the patient  would benefit from home health PT and OT, they also understand that if patient is to go home with the drain she will need a Therapist, sports. They chose [facility] and the referral was communicated to [place], [facility] liaison  03/04/14 Edwyna Shell RN BSN CM [phone] Spoke with patient regarding recommendation for [facility] PT follow up and patient stated that she will have to discuss with her daughters. Patient stated that she lives at home, drives self, and has the support of her daughter. Will continue to follow  02/28/14 Edwyna Shell RN BSN CM [phone] No orders, no needs

## 2014-03-09 NOTE — Discharge Instructions (Signed)

## 2014-03-09 NOTE — Discharge Summary (Signed)
Physician Discharge Summary  Kristine Burnett WIO:[id number] DOB: 05-May-1929 DOA: 02/26/2014  PCP: Alesia Richards, MD  Admit date: 02/26/2014 Discharge date: 03/09/2014   Recommendations for Outpatient Follow-Up:   1. Home health RN, PT, aide set up at discharge. 2. F/U CT in 5 days needed to re-assess abscess. 3. F/U with IR/surgery for removal of abscess drain. 4. F/U final abscess cultures.  Preliminary culture + for Pseudomonas, negative for anaerobes to date but the patient was discharged on Cipro/Flagyl. Can discontinue Flagyl if anaerobic cultures are negative.   Discharge Diagnosis:   Principal Problem:    Bowel perforation (perforated appendicitis with abscess) status post percutaneous drain placement Active Problems:    Essential hypertension    CKD stage 2 due to type 2 diabetes mellitus    Diabetes mellitus type 2, controlled    Pelvic abscess in female    Right lower quadrant abdominal abscess    Diverticulitis of cecum    RLQ abdominal pain    Small bowel perforation   Discharge Condition: Improved.  Diet recommendation:  Carbohydrate-modified.    History of Present Illness:   Kristine Burnett is an 80 y.o. female with history of diabetes mellitus, hypertension and hyperlipidemia who presented to [facility] ED on 03/08/14 with a chief complaint of a 2 day history of lower abdominal pain radiating to bilateral sides. Patient reported some nausea but no vomiting or fevers. On admission, patient was hemodynamically stable. Evaluation included CT abdomen and pelvis which was concerning for bowel perforation. She did not require surgical intervention as she improved with IV fluids and ertapenem. Repeat CT abdomen showed RLQ abscess and dilated appendix. IR consulted for percutaneous drainage of abscess.   Hospital Course by Problem:   Principal Problem:  Bowel perforation / right lower quadrant abscess / leukocytosis  - CT abdomen and pelvis with contrast showed  free fluid and small amount of free air in the abdomen suspicious for bowel perforation. Pt initially improved with IV abx. Repeat CT abdomen showed RLQ abscess and dilated appendix. IR consulted for percutaneous drainage of abscess, which was done 03/06/14.  - Patient is status post CT-guided drainage of right lower quadrant abscess 03/06/2014 and fluid sent for culture analysis with cultures positive for Pseudomonas. - Treated with ertapenem. Discharged home on an additional seven-day course of Cipro/Flagyl.  Active problems:  Hypokalemia - Secondary to GI losses. Potassium was supplemented through IV fluids.  - Potassium within normal limits at discharge.  Essential hypertension - Continue home antihypertensive medications: atenolol 100 mg daily and Cardizem 120 mg PO Q 12 hours.   Diabetes mellitus, controlled with no complications - L8G in 53/6468 6.1 indicating good glycemic control - Continue metformin.    Medical Consultants:    Dr. Armandina Gemma, Surgery  Dr. Damita Dunnings, IR   Discharge Exam:   Filed Vitals:   03/09/14 1453  BP: 138/85  Pulse: 69  Temp: 97.9 F (36.6 C)  Resp: 16   Filed Vitals:   03/08/14 1428 03/08/14 2138 03/09/14 0556 03/09/14 1453  BP: 148/66 150/64 140/65 138/85  Pulse: 70 72 67 69  Temp: 97.9 F (36.6 C) 97.9 F (36.6 C) 98.2 F (36.8 C) 97.9 F (36.6 C)  TempSrc: Oral Oral Oral Oral  Resp: 18 16 16 16   Height:      Weight:      SpO2: 100% 100% 100% 100%    Gen:  NAD Cardiovascular:  RRR, No M/R/G Respiratory: Lungs CTAB Gastrointestinal: Abdomen soft, NT/ND  with normal active bowel sounds. Extremities: No C/E/C   The results of significant diagnostics from this hospitalization (including imaging, microbiology, ancillary and laboratory) are listed below for reference.     Procedures and Diagnostic Studies:   Ct Abdomen Pelvis W Contrast 02/26/2014 Free fluid and small amount of free air in the abdomen suspicious for  bowel perforation. Cause not identified. Appendix is not visualized but inflammatory changes are present in the right lower quadrant, possibly indicating perforated appendicitis. 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 Vascular compromise to the bowel is not excluded. Surgical absence of the gallbladder with prominent intra and extrahepatic bile duct dilatation. This is likely postoperative but occult noncalcified common duct stone is not excluded.   Ct Abdomen Pelvis W Contrast 03/05/2014 1. Two adjacent collections have developed in the right pelvis since the prior exam. The largest measures 7.7 cm in greatest dimension. It contains fluid and nondependent air. The smaller collection lies below this near the inguinal ligament measuring 2.8 cm in greatest dimension. The larger collection borders on the posterior lateral cecum. It also lies adjacent to a dilated tubular structure that appears to be blind-ending. This is concerning for a dilated appendix. There is surrounding inflammatory change. Findings are most suspicious for acute appendicitis with rupture and abscess formation. 2. Free air noted on the prior CT is no longer evident. 3. No other acute findings.   Ct Image Guided Drainage By Percutaneous Catheter 03/06/2014 Status post CT-guided drainage of right lower quadrant abscess. A 12 French pigtail catheter was placed. 85 cc of frank pus was aspirated with a sample sent to the lab for culture.   Labs:   Basic Metabolic Panel:  Recent Labs Lab 03/03/14 0440 03/07/14 0448  NA 137 138  K 4.7 4.4  CL 102 99  CO2 28 28  GLUCOSE 109* 96  BUN 7 6  CREATININE 0.58 0.66  CALCIUM 8.7 9.1   GFR Estimated Creatinine Clearance: 52.7 mL/min (by C-G formula based on Cr of 0.66).   Recent Labs Lab 03/05/14 1315  INR 1.06    CBC:  Recent Labs Lab 03/03/14 0440 03/06/14 0525 03/07/14 0448  WBC 9.1 10.6* 9.4  NEUTROABS  --   7.1  --   HGB 10.4* 10.3* 10.2*  HCT 31.9* 30.5* 30.6*  MCV 91.4 88.2 89.7  PLT 160 231 233    CBG:  Recent Labs Lab 03/08/14 1006 03/08/14 1127 03/08/14 1629 03/09/14 0746 03/09/14 1143  GLUCAP 128* 101* 110* 32 106*   Microbiology Recent Results (from the past 240 hour(s))  Culture, routine-abscess     Status: None (Preliminary result)   Collection Time: 03/06/14  9:10 AM  Result Value Ref Range Status   Specimen Description ABSCESS  Final   Special Requests NONE  Final   Gram Stain   Final    ABUNDANT WBC PRESENT,BOTH PMN AND MONONUCLEAR NO SQUAMOUS EPITHELIAL CELLS SEEN FEW GRAM POSITIVE COCCI IN PAIRS IN CLUSTERS FEW GRAM NEGATIVE RODS Performed at [facility]    Culture   Final    MODERATE PSEUDOMONAS AERUGINOSA Performed at [facility]    Report Status PENDING  Incomplete  Anaerobic culture     Status: None (Preliminary result)   Collection Time: 03/06/14  9:10 AM  Result Value Ref Range Status   Specimen Description ABSCESS  Final   Special Requests NONE  Final   Gram Stain   Final    ABUNDANT WBC  PRESENT,BOTH PMN AND MONONUCLEAR NO SQUAMOUS EPITHELIAL CELLS SEEN FEW GRAM POSITIVE COCCI IN PAIRS IN CLUSTERS FEW GRAM NEGATIVE RODS Performed at [facility]    Culture   Final    NO ANAEROBES ISOLATED; CULTURE IN PROGRESS FOR 5 DAYS Performed at [facility]    Report Status PENDING  Incomplete     Discharge Instructions:   Discharge Instructions    Call MD for:  extreme fatigue    Complete by:  As directed      Call MD for:  redness, tenderness, or signs of infection (pain, swelling, redness, odor or green/yellow discharge around incision site)    Complete by:  As directed      Call MD for:  severe uncontrolled pain    Complete by:  As directed      Call MD for:  temperature >100.4    Complete by:  As directed      Diet Carb Modified    Complete by:  As directed      Discharge instructions    Complete by:   As directed   A home health RN, nurse and nurse's aide will come to your home to help with your follow up care.  Keep the drain site clean, dry and intact.  We will set up an outpatient follow up CT scan for you.  The radiology department will call you with an appointment time.  Take all of your antibiotics until the pills are gone.    You were cared for by Dr. Jacquelynn Cree  (a hospitalist) during your hospital stay. If you have any questions about your discharge medications or the care you received while you were in the hospital after you are discharged, you can call the unit and ask to speak with the hospitalist on call if the hospitalist that took care of you is not available. Once you are discharged, your primary care physician will handle any further medical issues. Please note that NO REFILLS for any discharge medications will be authorized once you are discharged, as it is imperative that you return to your primary care physician (or establish a relationship with a primary care physician if you do not have one) for your aftercare needs so that they can reassess your need for medications and monitor your lab values.  Any outstanding tests can be reviewed by your PCP at your follow up visit.  It is also important to review any medicine changes with your PCP.  Please bring these d/c instructions with you to your next visit so your physician can review these changes with you.  If you do not have a primary care physician, you can call [phone] for a physician referral.  It is highly recommended that you obtain a PCP for hospital follow up.     Face-to-face encounter (required for Medicare/Medicaid patients)    Complete by:  As directed   I Darshawn Boateng certify that this patient is under my care and that I, or a nurse practitioner or physician's assistant working with me, had a face-to-face encounter that meets the physician face-to-face encounter requirements with this patient on 03/09/2014. The  encounter with the patient was in whole, or in part for the following medical condition(s) which is the primary reason for home health care (List medical condition): Prolonged hospitalization for intraabdominal abscess, deconditioning.  Has  drain in place which will need to be flushed with 10 cc NS TID.  The encounter with the patient was in whole, or in  part, for the following medical condition, which is the primary reason for home health care:  Intraabdominal abscess, prolonged hospital stay  I certify that, based on my findings, the following services are medically necessary home health services:   NursingPhysical therapy    Reason for Medically Necessary Home Health Services:   Skilled Nursing- Skilled Assessment/ObservationSkilled Nursing- Post-Surgical Wound Assessment and CareTherapy- Home Adaptation to Facilitate SafetyTherapy- Therapeutic Exercises to Increase Strength and Endurance    My clinical findings support the need for the above services:  Unable to leave home safely without assistance and/or assistive device  Further, I certify that my clinical findings support that this patient is homebound due to:  Unable to leave home safely without assistance     Home Health    Complete by:  As directed   To provide the following care/treatments:   PTRNHome Health Aide       Increase activity slowly    Complete by:  As directed             Medication List    TAKE these medications        acetaminophen 325 MG tablet  Commonly known as:  TYLENOL  Take 2 tablets (650 mg total) by mouth every 6 (six) hours as needed for mild pain (or Fever >/= 101).     alendronate 70 MG tablet  Commonly known as:  FOSAMAX  Take 1 tablet (70 mg total) by mouth once a week. Take with a full glass of water on an empty stomach.     aspirin 325 MG tablet  Take 325 mg by mouth daily.     atenolol 100 MG tablet  Commonly known as:  TENORMIN  TAKE 1 TABLET BY MOUTH DAILY FOR BLOOD PRESSURE      baclofen 10 MG tablet  Commonly known as:  LIORESAL  TAKE 1/2 TO 1 TABLET BY MOUTH THREE TIMES DAILY AS NEEDED FOR MUSCLE SPASM     ciprofloxacin 500 MG tablet  Commonly known as:  CIPRO  Take 1 tablet (500 mg total) by mouth 2 (two) times daily.     diltiazem 120 MG tablet  Commonly known as:  CARDIZEM  TAKE 1 TABLET BY MOUTH TWICE DAILY FOR HIGH BLOOD PRESSURE     enalapril 20 MG tablet  Commonly known as:  VASOTEC  TAKE 1 TABLET BY MOUTH TWICE DAILY     Flaxseed Oil 1000 MG Caps  Take 1,000 mg by mouth 4 (four) times daily.     freestyle lancets  Test glucose 1 time daily     hydrochlorothiazide 25 MG tablet  Commonly known as:  HYDRODIURIL  TAKE 1 TABLET BY MOUTH EVERY MORNING     MAG-OX PO  Take 500 mg by mouth 2 (two) times daily.     metFORMIN 500 MG tablet  Commonly known as:  GLUCOPHAGE  Take 1 tablet (500 mg total) by mouth 2 (two) times daily with a meal.     metroNIDAZOLE 500 MG tablet  Commonly known as:  FLAGYL  Take 1 tablet (500 mg total) by mouth every 8 (eight) hours.     MULTIVITAMIN & MINERAL PO  Take by mouth daily.     naproxen sodium 220 MG tablet  Commonly known as:  ANAPROX  Take 220 mg by mouth 2 (two) times daily with a meal.     oxybutynin 5 MG tablet  Commonly known as:  DITROPAN  TAKE 1 TABLET BY MOUTH TWICE DAILY FOR BLADDER  simvastatin 40 MG tablet  Commonly known as:  ZOCOR  TAKE 1 TABLET BY MOUTH EVERY NIGHT AT BEDTIME FOR CHOLESTEROL     Vitamin B-12 2500 MCG Subl  Place 1 tablet under the tongue once a week.     Vitamin D-3 1000 UNITS Caps  Take 4,000 Units by mouth daily.           Follow-up Information    Follow up with MCKEOWN,WILLIAM DAVID, MD. Schedule an appointment as soon as possible for a visit in 1 week.   Specialty:  Internal Medicine   Why:  Hospital follow up.   Contact information:   [address] [place] [address] [phone]       Follow up with Earnstine Regal, MD.  Schedule an appointment as soon as possible for a visit in 2 weeks.   Specialty:  General Surgery   Why:  To consider removal of the drain.   Contact information:   [address] [place] [address] [phone]       Follow up with [facility].   Why:  Home Health Physical Therapy, RN and aide   Contact information:   [address] [place] [address] [phone]        Time coordinating discharge: 35 minutes.  Signed:  Abner Ardis  Pager [phone] [facility] 03/09/2014, 3:51 PM

## 2014-03-09 NOTE — Progress Notes (Signed)
Medicare Important Message given? YES  (If response is "NO", the following Medicare IM given date fields will be blank)  Date Medicare IM given:  03/09/2014 Medicare IM given by: Anthony Roland  

## 2014-03-09 NOTE — Progress Notes (Signed)
Pt left at this time with her daughter at her side. Pt alert, oriented, and without c/o. Discharge instructions/prescriptions given/explained with pt verbalizing understanding. [place] set-up with [facility]. Followup appointments noted.  Drain care supplies for dressing to RLQ sent home with pt.

## 2014-03-10 LAB — CULTURE, ROUTINE-ABSCESS

## 2014-03-11 DIAGNOSIS — K631 Perforation of intestine (nontraumatic): Secondary | ICD-10-CM

## 2014-03-11 DIAGNOSIS — K353 Acute appendicitis with localized peritonitis: Secondary | ICD-10-CM

## 2014-03-11 DIAGNOSIS — Z48815 Encounter for surgical aftercare following surgery on the digestive system: Secondary | ICD-10-CM

## 2014-03-11 DIAGNOSIS — N739 Female pelvic inflammatory disease, unspecified: Secondary | ICD-10-CM

## 2014-03-11 LAB — ANAEROBIC CULTURE

## 2014-03-12 ENCOUNTER — Encounter: Payer: Self-pay | Admitting: Internal Medicine

## 2014-03-12 ENCOUNTER — Telehealth: Payer: Self-pay | Admitting: *Deleted

## 2014-03-12 ENCOUNTER — Ambulatory Visit (INDEPENDENT_AMBULATORY_CARE_PROVIDER_SITE_OTHER): Payer: Commercial Managed Care - HMO | Admitting: Internal Medicine

## 2014-03-12 VITALS — BP 144/72 | HR 64 | Temp 98.6°F | Resp 16 | Ht 66.0 in | Wt 154.8 lb

## 2014-03-12 DIAGNOSIS — E1122 Type 2 diabetes mellitus with diabetic chronic kidney disease: Secondary | ICD-10-CM

## 2014-03-12 DIAGNOSIS — I10 Essential (primary) hypertension: Secondary | ICD-10-CM

## 2014-03-12 DIAGNOSIS — Z79899 Other long term (current) drug therapy: Secondary | ICD-10-CM

## 2014-03-12 DIAGNOSIS — N182 Chronic kidney disease, stage 2 (mild): Secondary | ICD-10-CM

## 2014-03-12 LAB — CBC WITH DIFFERENTIAL/PLATELET
Basophils Absolute: 0.1 10*3/uL (ref 0.0–0.1)
Basophils Relative: 1 % (ref 0–1)
EOS PCT: 2 % (ref 0–5)
Eosinophils Absolute: 0.1 10*3/uL (ref 0.0–0.7)
HCT: 34.4 % — ABNORMAL LOW (ref 36.0–46.0)
HEMOGLOBIN: 11.8 g/dL — AB (ref 12.0–15.0)
LYMPHS PCT: 50 % — AB (ref 12–46)
Lymphs Abs: 2.7 10*3/uL (ref 0.7–4.0)
MCH: 29.9 pg (ref 26.0–34.0)
MCHC: 34.3 g/dL (ref 30.0–36.0)
MCV: 87.3 fL (ref 78.0–100.0)
MONOS PCT: 14 % — AB (ref 3–12)
MPV: 8.7 fL — ABNORMAL LOW (ref 9.4–12.4)
Monocytes Absolute: 0.7 10*3/uL (ref 0.1–1.0)
Neutro Abs: 1.7 10*3/uL (ref 1.7–7.7)
Neutrophils Relative %: 33 % — ABNORMAL LOW (ref 43–77)
PLATELETS: 479 10*3/uL — AB (ref 150–400)
RBC: 3.94 MIL/uL (ref 3.87–5.11)
RDW: 14.9 % (ref 11.5–15.5)
WBC: 5.3 10*3/uL (ref 4.0–10.5)

## 2014-03-12 LAB — BASIC METABOLIC PANEL WITH GFR
BUN: 21 mg/dL (ref 6–23)
CHLORIDE: 95 meq/L — AB (ref 96–112)
CO2: 29 meq/L (ref 19–32)
CREATININE: 0.96 mg/dL (ref 0.50–1.10)
Calcium: 9.5 mg/dL (ref 8.4–10.5)
GFR, Est African American: 64 mL/min
GFR, Est Non African American: 55 mL/min — ABNORMAL LOW
Glucose, Bld: 118 mg/dL — ABNORMAL HIGH (ref 70–99)
POTASSIUM: 4.9 meq/L (ref 3.5–5.3)
Sodium: 135 mEq/L (ref 135–145)

## 2014-03-12 MED ORDER — METFORMIN HCL ER 500 MG PO TB24: ORAL_TABLET | ORAL | Status: DC

## 2014-03-12 NOTE — Telephone Encounter (Signed)
Kristine Burnett called to report possible drug interaction between Simvastatin and Cardizem.  OK to continue meds per Dr Melford Aase.  Maria aware.

## 2014-03-15 ENCOUNTER — Emergency Department (HOSPITAL_COMMUNITY): Payer: Medicare HMO

## 2014-03-15 ENCOUNTER — Inpatient Hospital Stay (HOSPITAL_COMMUNITY)
Admission: EM | Admit: 2014-03-15 | Discharge: 2014-03-24 | DRG: 371 | Disposition: A | Discharge status: Home Health | Payer: Medicare HMO | Attending: Internal Medicine | Admitting: Internal Medicine

## 2014-03-15 ENCOUNTER — Encounter (HOSPITAL_COMMUNITY): Payer: Self-pay | Admitting: Emergency Medicine

## 2014-03-15 DIAGNOSIS — E785 Hyperlipidemia, unspecified: Secondary | ICD-10-CM | POA: Diagnosis present

## 2014-03-15 DIAGNOSIS — K56609 Unspecified intestinal obstruction, unspecified as to partial versus complete obstruction: Secondary | ICD-10-CM | POA: Diagnosis present

## 2014-03-15 DIAGNOSIS — Z87891 Personal history of nicotine dependence: Secondary | ICD-10-CM | POA: Diagnosis not present

## 2014-03-15 DIAGNOSIS — K353 Acute appendicitis with localized peritonitis: Secondary | ICD-10-CM | POA: Diagnosis present

## 2014-03-15 DIAGNOSIS — R112 Nausea with vomiting, unspecified: Secondary | ICD-10-CM | POA: Diagnosis present

## 2014-03-15 DIAGNOSIS — K651 Peritoneal abscess: Secondary | ICD-10-CM | POA: Diagnosis present

## 2014-03-15 DIAGNOSIS — K5660 Unspecified intestinal obstruction: Secondary | ICD-10-CM | POA: Diagnosis present

## 2014-03-15 DIAGNOSIS — Z6824 Body mass index (BMI) 24.0-24.9, adult: Secondary | ICD-10-CM | POA: Diagnosis not present

## 2014-03-15 DIAGNOSIS — K631 Perforation of intestine (nontraumatic): Secondary | ICD-10-CM | POA: Diagnosis present

## 2014-03-15 DIAGNOSIS — D649 Anemia, unspecified: Secondary | ICD-10-CM | POA: Diagnosis present

## 2014-03-15 DIAGNOSIS — E119 Type 2 diabetes mellitus without complications: Secondary | ICD-10-CM | POA: Diagnosis present

## 2014-03-15 DIAGNOSIS — M1990 Unspecified osteoarthritis, unspecified site: Secondary | ICD-10-CM | POA: Diagnosis present

## 2014-03-15 DIAGNOSIS — I10 Essential (primary) hypertension: Secondary | ICD-10-CM | POA: Diagnosis present

## 2014-03-15 DIAGNOSIS — E440 Moderate protein-calorie malnutrition: Secondary | ICD-10-CM | POA: Diagnosis not present

## 2014-03-15 DIAGNOSIS — J45909 Unspecified asthma, uncomplicated: Secondary | ICD-10-CM | POA: Diagnosis present

## 2014-03-15 DIAGNOSIS — M4800 Spinal stenosis, site unspecified: Secondary | ICD-10-CM | POA: Diagnosis present

## 2014-03-15 DIAGNOSIS — K529 Noninfective gastroenteritis and colitis, unspecified: Secondary | ICD-10-CM | POA: Diagnosis present

## 2014-03-15 DIAGNOSIS — Z8249 Family history of ischemic heart disease and other diseases of the circulatory system: Secondary | ICD-10-CM | POA: Diagnosis not present

## 2014-03-15 DIAGNOSIS — K3533 Acute appendicitis with perforation and localized peritonitis, with abscess: Secondary | ICD-10-CM

## 2014-03-15 DIAGNOSIS — E876 Hypokalemia: Secondary | ICD-10-CM | POA: Diagnosis present

## 2014-03-15 DIAGNOSIS — G43A0 Cyclical vomiting, not intractable: Secondary | ICD-10-CM

## 2014-03-15 HISTORY — DX: Unspecified intestinal obstruction, unspecified as to partial versus complete obstruction: K56.609

## 2014-03-15 LAB — CBC WITH DIFFERENTIAL/PLATELET
BASOS ABS: 0 10*3/uL (ref 0.0–0.1)
Basophils Relative: 0 % (ref 0–1)
EOS PCT: 0 % (ref 0–5)
Eosinophils Absolute: 0 10*3/uL (ref 0.0–0.7)
HEMATOCRIT: 27.2 % — AB (ref 36.0–46.0)
HEMOGLOBIN: 9.1 g/dL — AB (ref 12.0–15.0)
Lymphocytes Relative: 33 % (ref 12–46)
Lymphs Abs: 1.7 10*3/uL (ref 0.7–4.0)
MCH: 29.9 pg (ref 26.0–34.0)
MCHC: 33.5 g/dL (ref 30.0–36.0)
MCV: 89.5 fL (ref 78.0–100.0)
MONO ABS: 0.4 10*3/uL (ref 0.1–1.0)
MONOS PCT: 8 % (ref 3–12)
NEUTROS ABS: 3 10*3/uL (ref 1.7–7.7)
Neutrophils Relative %: 59 % (ref 43–77)
Platelets: 339 10*3/uL (ref 150–400)
RBC: 3.04 MIL/uL — ABNORMAL LOW (ref 3.87–5.11)
RDW: 14.4 % (ref 11.5–15.5)
WBC: 5.2 10*3/uL (ref 4.0–10.5)

## 2014-03-15 LAB — COMPREHENSIVE METABOLIC PANEL
ALT: 12 U/L (ref 0–35)
ANION GAP: 7 (ref 5–15)
AST: 23 U/L (ref 0–37)
Albumin: 2.8 g/dL — ABNORMAL LOW (ref 3.5–5.2)
Alkaline Phosphatase: 62 U/L (ref 39–117)
BILIRUBIN TOTAL: 0.7 mg/dL (ref 0.3–1.2)
BUN: 17 mg/dL (ref 6–23)
CALCIUM: 7.7 mg/dL — AB (ref 8.4–10.5)
CHLORIDE: 102 meq/L (ref 96–112)
CO2: 25 mmol/L (ref 19–32)
CREATININE: 0.66 mg/dL (ref 0.50–1.10)
GFR calc Af Amer: >90 mL/min (ref >90)
GFR, EST NON AFRICAN AMERICAN: 80 mL/min — AB (ref >90)
Glucose, Bld: 132 mg/dL — ABNORMAL HIGH (ref 70–99)
Potassium: 2.8 mmol/L — ABNORMAL LOW (ref 3.5–5.1)
Sodium: 134 mmol/L — ABNORMAL LOW (ref 135–145)
Total Protein: 6.1 g/dL (ref 6.0–8.3)

## 2014-03-15 LAB — LIPASE, BLOOD: LIPASE: 12 U/L (ref 11–59)

## 2014-03-15 LAB — GLUCOSE, CAPILLARY: GLUCOSE-CAPILLARY: 98 mg/dL (ref 70–99)

## 2014-03-15 MED ORDER — ONDANSETRON HCL 4 MG/2ML IJ SOLN: 4.0000 mg | Freq: Four times a day (QID) | INTRAMUSCULAR | Status: DC | PRN

## 2014-03-15 MED ORDER — INSULIN ASPART 100 UNIT/ML ~~LOC~~ SOLN
0.0000 [IU] | Freq: Three times a day (TID) | SUBCUTANEOUS | Status: AC
  Administered 2014-03-18: 1 [IU] via SUBCUTANEOUS

## 2014-03-15 MED ORDER — METRONIDAZOLE IN NACL 5-0.79 MG/ML-% IV SOLN
500.0000 mg | Freq: Three times a day (TID) | INTRAVENOUS | Status: AC
  Administered 2014-03-15 – 2014-03-18 (×9): 500 mg via INTRAVENOUS
  Filled 2014-03-15 (×10): qty 100

## 2014-03-15 MED ORDER — CIPROFLOXACIN IN D5W 400 MG/200ML IV SOLN
400.0000 mg | Freq: Once | INTRAVENOUS | Status: AC
  Administered 2014-03-15: 400 mg via INTRAVENOUS
  Filled 2014-03-15: qty 200

## 2014-03-15 MED ORDER — ONDANSETRON HCL 4 MG PO TABS: 4.0000 mg | ORAL_TABLET | Freq: Four times a day (QID) | ORAL | Status: DC | PRN

## 2014-03-15 MED ORDER — SODIUM CHLORIDE 0.9 % IV SOLN
1000.0000 mL | Freq: Once | INTRAVENOUS | Status: AC
  Administered 2014-03-15: 1000 mL via INTRAVENOUS

## 2014-03-15 MED ORDER — LIDOCAINE HCL 2 % EX GEL
CUTANEOUS | Status: AC
  Administered 2014-03-15: 15:00:00
  Filled 2014-03-15: qty 10

## 2014-03-15 MED ORDER — ATENOLOL 100 MG PO TABS
100.0000 mg | ORAL_TABLET | Freq: Every day | ORAL | Status: DC
  Administered 2014-03-16 – 2014-03-24 (×9): 100 mg via ORAL
  Filled 2014-03-15 (×10): qty 1

## 2014-03-15 MED ORDER — SODIUM CHLORIDE 0.9 % IV SOLN
INTRAVENOUS | Status: DC
  Administered 2014-03-15: 16:00:00 via INTRAVENOUS

## 2014-03-15 MED ORDER — SODIUM CHLORIDE 0.9 % IV SOLN
1000.0000 mL | INTRAVENOUS | Status: DC
  Administered 2014-03-15: 1000 mL via INTRAVENOUS

## 2014-03-15 MED ORDER — SODIUM CHLORIDE 0.9 % IV SOLN
INTRAVENOUS | Status: DC
  Administered 2014-03-15 – 2014-03-17 (×3): via INTRAVENOUS
  Filled 2014-03-15 (×4): qty 1000

## 2014-03-15 MED ORDER — IOHEXOL 300 MG/ML  SOLN
50.0000 mL | Freq: Once | INTRAMUSCULAR | Status: AC | PRN
  Administered 2014-03-15: 50 mL via ORAL

## 2014-03-15 MED ORDER — MORPHINE SULFATE 2 MG/ML IJ SOLN
1.0000 mg | INTRAMUSCULAR | Status: DC | PRN
  Administered 2014-03-15 – 2014-03-23 (×14): 1 mg via INTRAVENOUS
  Filled 2014-03-15 (×14): qty 1

## 2014-03-15 MED ORDER — ONDANSETRON HCL 4 MG/2ML IJ SOLN
4.0000 mg | Freq: Once | INTRAMUSCULAR | Status: AC
  Administered 2014-03-15: 4 mg via INTRAVENOUS
  Filled 2014-03-15: qty 2

## 2014-03-15 MED ORDER — IOHEXOL 300 MG/ML  SOLN
100.0000 mL | Freq: Once | INTRAMUSCULAR | Status: AC | PRN
  Administered 2014-03-15: 100 mL via INTRAVENOUS

## 2014-03-15 MED ORDER — CIPROFLOXACIN IN D5W 400 MG/200ML IV SOLN: 400.0000 mg | Freq: Two times a day (BID) | INTRAVENOUS | Status: DC

## 2014-03-15 MED ORDER — DILTIAZEM HCL 60 MG PO TABS
120.0000 mg | ORAL_TABLET | Freq: Two times a day (BID) | ORAL | Status: DC
  Administered 2014-03-16 – 2014-03-24 (×17): 120 mg via ORAL
  Filled 2014-03-15 (×20): qty 2

## 2014-03-15 MED ORDER — ENALAPRIL MALEATE 20 MG PO TABS
20.0000 mg | ORAL_TABLET | Freq: Every day | ORAL | Status: DC
  Administered 2014-03-16 – 2014-03-24 (×9): 20 mg via ORAL
  Filled 2014-03-15 (×10): qty 1

## 2014-03-15 MED ORDER — CIPROFLOXACIN IN D5W 400 MG/200ML IV SOLN
400.0000 mg | Freq: Two times a day (BID) | INTRAVENOUS | Status: AC
  Administered 2014-03-16 – 2014-03-18 (×5): 400 mg via INTRAVENOUS
  Filled 2014-03-15 (×7): qty 200

## 2014-03-15 NOTE — ED Notes (Signed)
Pt c/o lower abd pain, n/v since yesterday morning. Pt was admitted recently for intestional tear/abscess and currently still has drain.  Pt wasn't ablt to digest her food yesterday and vomited.

## 2014-03-15 NOTE — ED Provider Notes (Addendum)
CSN: [id number]     Arrival date & time 03/15/14  6283 History   First MD Initiated Contact with Patient 03/15/14 [phone]     Chief Complaint  Patient presents with  . Nausea  . Emesis  . Abdominal Pain      HPI Patient ports worsening lower abdominal pain since yesterday morning with associated nausea vomiting.  No diarrhea.  No bowel movement.  She was discharged from the hospital 6 days ago after treatment in the hospital for an intra-abdominal abscess.  She was treated conservatively with percutaneous drainage and a pigtail catheter into the abscess.  There is question initially as to whether or not the appendix was inflamed but they felt this was not a ruptured appendicitis.  She was treated with IV antibiotics in the hospital is currently taking ciprofloxacin and Flagyl.  She denies fevers or chills.  She reports decreased oral intake since yesterday secondary to pain and nausea and vomiting.  Symptoms are moderate in severity.  Her last several days she's only had 1 cc of output from her drain  Past Medical History  Diagnosis Date  . Hypertension   . Hyperlipidemia   . Diabetes mellitus without complication   . Vitamin D deficiency   . Asthma   . DDD (degenerative disc disease)   . Spinal stenosis   . Type II or unspecified type diabetes mellitus without mention of complication, not stated as uncontrolled 03/22/2013   Past Surgical History  Procedure Laterality Date  . Hernia repair    . Cyst excision  back  . Cholecystectomy    . Mini-laparotomy w/ tubal ligation     Family History  Problem Relation Age of Onset  . Heart attack Mother   . Cancer Mother     breast  . Hypertension Mother   . Heart attack Father   . Stroke Sister   . Cancer Sister     breast  . Heart attack Brother   . Heart attack Brother    History  Substance Use Topics  . Smoking status: Former Research scientist (life sciences)  . Smokeless tobacco: Not on file  . Alcohol Use: No   OB History    No data available      Review of Systems  All other systems reviewed and are negative.     Allergies  Ace inhibitors; Crestor; Minoxidil; Penicillins; and Grapefruit extract  Home Medications   Prior to Admission medications   Medication Sig Start Date End Date Taking? Authorizing Provider  aspirin 325 MG tablet Take 325 mg by mouth daily.   Yes Historical Provider, MD  atenolol (TENORMIN) 100 MG tablet TAKE 1 TABLET BY MOUTH DAILY FOR BLOOD PRESSURE 12/02/13  Yes Unk Pinto, MD  Cholecalciferol (VITAMIN D-3) 1000 UNITS CAPS Take 4,000 Units by mouth daily.   Yes Historical Provider, MD  ciprofloxacin (CIPRO) 500 MG tablet Take 1 tablet (500 mg total) by mouth 2 (two) times daily. 03/09/14  Yes Venetia Maxon Rama, MD  Cyanocobalamin (VITAMIN B-12) 2500 MCG SUBL Place 1 tablet under the tongue once a week. Thursdays   Yes Historical Provider, MD  diltiazem (CARDIZEM) 120 MG tablet TAKE 1 TABLET BY MOUTH TWICE DAILY FOR HIGH BLOOD PRESSURE 08/03/13  Yes Vicie Mutters, PA-C  enalapril (VASOTEC) 20 MG tablet TAKE 1 TABLET BY MOUTH TWICE DAILY 01/28/14  Yes Unk Pinto, MD  Flaxseed, Linseed, (FLAXSEED OIL) 1000 MG CAPS Take 1,000 mg by mouth 4 (four) times daily.   Yes Historical Provider, MD  Christianne Dolin  HIGH-DOSE 0.5 ML SUSY  12/09/13  Yes Historical Provider, MD  FREESTYLE LITE test strip 1 each by Other route daily.  01/28/14  Yes Historical Provider, MD  hydrochlorothiazide (HYDRODIURIL) 25 MG tablet TAKE 1 TABLET BY MOUTH EVERY MORNING 12/02/13  Yes Unk Pinto, MD  Lancets (FREESTYLE) lancets Test glucose 1 time daily 08/16/13  Yes Unk Pinto, MD  Magnesium Oxide (MAG-OX PO) Take 500 mg by mouth 2 (two) times daily.   Yes Historical Provider, MD  metFORMIN (GLUCOPHAGE XR) 500 MG 24 hr tablet Take 1 tablet with Breakfast AND Lunch and 2 tablets with Supper 03/12/14 03/13/15 Yes Unk Pinto, MD  metroNIDAZOLE (FLAGYL) 500 MG tablet Take 1 tablet (500 mg total) by mouth every 8 (eight) hours.  03/09/14  Yes Venetia Maxon Rama, MD  Multiple Vitamins-Minerals (MULTIVITAMIN & MINERAL PO) Take by mouth daily.   Yes Historical Provider, MD  naproxen sodium (ALEVE) 220 MG tablet Take 220 mg by mouth 2 (two) times daily.   Yes Historical Provider, MD  oxybutynin (DITROPAN) 5 MG tablet TAKE 1 TABLET BY MOUTH TWICE DAILY FOR BLADDER 01/28/14  Yes Unk Pinto, MD  simvastatin (ZOCOR) 40 MG tablet TAKE 1 TABLET BY MOUTH EVERY NIGHT AT BEDTIME FOR CHOLESTEROL 08/03/13  Yes Vicie Mutters, PA-C  acetaminophen (TYLENOL) 325 MG tablet Take 2 tablets (650 mg total) by mouth every 6 (six) hours as needed for mild pain (or Fever >/= 101). Patient not taking: Reported on 03/15/2014 03/09/14   Venetia Maxon Rama, MD  alendronate (FOSAMAX) 70 MG tablet Take 1 tablet (70 mg total) by mouth once a week. Take with a full glass of water on an empty stomach. Patient not taking: Reported on 03/15/2014 10/29/13   Unk Pinto, MD   BP 156/80 mmHg  Pulse 90  Temp(Src) 98.5 F (36.9 C) (Oral)  Resp 18  SpO2 98% Physical Exam  Constitutional: She is oriented to person, place, and time. She appears well-developed and well-nourished. No distress.  HENT:  Head: Normocephalic and atraumatic.  Eyes: EOM are normal.  Neck: Normal range of motion.  Cardiovascular: Normal rate, regular rhythm and normal heart sounds.   Pulmonary/Chest: Effort normal and breath sounds normal.  Abdominal: Soft. She exhibits no distension.  Mild tenderness in the right lower quadrant.  JP drain in place without surrounding signs of infection.  Musculoskeletal: Normal range of motion.  Neurological: She is alert and oriented to person, place, and time.  Skin: Skin is warm and dry.  Psychiatric: She has a normal mood and affect. Judgment normal.  Nursing note and vitals reviewed.   ED Course  Procedures (including critical care time) Labs Review Labs Reviewed  CBC WITH DIFFERENTIAL - Abnormal; Notable for the following:    RBC  3.04 (*)    Hemoglobin 9.1 (*)    HCT 27.2 (*)    All other components within normal limits  COMPREHENSIVE METABOLIC PANEL - Abnormal; Notable for the following:    Sodium 134 (*)    Potassium 2.8 (*)    Glucose, Bld 132 (*)    Calcium 7.7 (*)    Albumin 2.8 (*)    GFR calc non Af Amer 80 (*)    All other components within normal limits  LIPASE, BLOOD    Imaging Review Ct Abdomen Pelvis W Contrast  03/15/2014   CLINICAL DATA:  Post recent abscess drainage procedure 03/06/2014 right lower quadrant. New onset pain, nausea and vomiting 1 day.  EXAM: CT ABDOMEN AND PELVIS WITH CONTRAST  TECHNIQUE: Multidetector CT imaging of the abdomen and pelvis was performed using the standard protocol following bolus administration of intravenous contrast.  CONTRAST:  28mL OMNIPAQUE IOHEXOL 300 MG/ML SOLN, 144mL OMNIPAQUE IOHEXOL 300 MG/ML SOLN  COMPARISON:  03/06/2014, 03/05/2014, and 02/26/2014 as well as 12/30/2008.  FINDINGS: Lung bases demonstrate mild focal bronchiectatic change over the right lower lobe.  Abdominal images demonstrate evidence of previous cholecystectomy with stable prominence of the common bile duct and central intrahepatic ducts. Liver is otherwise unremarkable. Spleen, pancreas and adrenal glands are within normal. Kidneys are normal in size with a few small sub cm right renal cortical hypodensities unchanged and too small to characterize but likely cysts. There is minimal calcified plaque over the abdominal aorta and iliac arteries.  Patient's percutaneous drainage catheter is seen entering the right lower anterior abdominal wall extending into the right lower quadrant/ upper pelvis unchanged in position. There has been resolution of the previously noted abscess in the right lower quadrant. The previously suspected inflamed appendix has decreased in size and measures 5-6 mm in diameter.  There are multiple dilated small bowel loops compatible with a distal small bowel obstruction with  transition point in the right lower quadrant just below the cecum. There is minimal wall thickening and ill definition of the wall of the entire colon with sparing of the rectum as cannot exclude an acute colitis. There is free fluid over the right lower quadrant.  Remaining pelvic structures are unchanged. Remaining bones and soft tissues are unchanged.  IMPRESSION: Percutaneous pigtail drainage catheter over the right lower quadrant unchanged in position as there has been complete resolution of the previously noted right lower quadrant abscess. The previously noted suspected inflamed appendix is now normal in caliber measuring 5-6 mm.  New distal small bowel obstructive process with transition point over the right lower quadrant medially below the cecum. Mild associated free fluid in the right mid to lower abdomen.  Mild wall thickening and ill definition of the wall of the entire colon with exception of the rectum which may be due to an acute colitis.  Other ancillary findings as described unchanged.  These results were called by telephone at the time of interpretation on 03/15/2014 at 1:39 pm to Dr. Jola Schmidt , who verbally acknowledged these results.   Electronically Signed   By: Marin Olp M.D.   On: 03/15/2014 13:39     EKG Interpretation None      MDM   Final diagnoses:  Nausea and vomiting  Abdominal visceral abscess  Small bowel obstruction    Patient with a small bowel obstruction.  Abscess seems to have improved with the percutaneous drainage.  NG tube will be placed.  Nothing by mouth.  Admit to hospital for ongoing care.  IV hydration.  Will discuss case with general surgery.  Patient will likely best be managed on the hospitalist service.    Hoy Morn, MD 03/15/14 1347  General surgery: Dr Buzzy Han   Hoy Morn, MD 03/15/14 [id number]

## 2014-03-15 NOTE — Consult Note (Signed)
Re:   Kristine Burnett DOB:   April 09, 1929 MRN:   [id number]   [facility] Consultation  ASSESSMENT AND PLAN: 1.  SBO  Obstruction appears to be at the point of RLQ inflammation.  Agree with NGT, IVF, antibiotics.  Will get KUB tomorrow and follow.  2.  Appendiceal abscess  Perc drain on 03/06/2014 by Dr. Earleen Newport  Abscess appears to have resolved.  Will discuss with IR about possible study of drain to see if we can remove it.   3.  Mild wall thickening of entire colon wall - unclear what this means. 4.  HTN x 20 years 5.  DM - just started meds this past summer 6.  Weight loss over the last 6 months 7.  DJD with spinal stenosis 8.  Anemia - Hgb - 9.1 - 03/15/2014  Chief Complaint  Patient presents with  . Nausea  . Emesis  . Abdominal Pain   REFERRING PHYSICIAN: Virgil, MD  HISTORY OF PRESENT ILLNESS: Kristine Burnett is a 80 y.o. (DOB: 14-Aug-1929)  AA  female whose primary care physician is MCKEOWN,WILLIAM Zerick Prevette, MD and is admitted today for a SBO.  She was admitted on 02/27/2014 for lower abdominal pain, weight loss, and chronic diarrhea.  She was initially seen by Dr. Clent Demark of our service.  She was found to have a RLQ inflammatory process that appeared to be a contained perforation. After 6 days of antibiotics, she was clinically better, but a CT scan showed an increasing RLQ abscess and she underwent a percutaneous drain on 03/06/2014 by Dr. Rolla Plate. She was discharged 03/09/2014 with the drain in place.  She has not been seen back in our office.  The patient then represented on 03/15/2014 with lower abdominal pain and nausea and vomiting since yesterday AM.  Her last BM was yesterday.  She had a abd/pelvic CT scan 03/15/2014 - Percutaneous pigtail drainage catheter over the right lower quadrant unchanged in position as there has been complete resolution of the previously noted right lower quadrant abscess. The previously noted suspected inflamed appendix is now normal  in caliber measuring 5-6 mm. New distal small bowel obstructive process with transition point over the right lower quadrant medially below the cecum. Mild associated free fluid in the right mid to lower abdomen.  There is also some fecalization of the small bowel. Mild wall thickening and ill definition of the wall of the entire colon with exception the rectum.    Past Medical History  Diagnosis Date  . Hypertension   . Hyperlipidemia   . Diabetes mellitus without complication   . Vitamin D deficiency   . Asthma   . DDD (degenerative disc disease)   . Spinal stenosis   . Type II or unspecified type diabetes mellitus without mention of complication, not stated as uncontrolled 03/22/2013      Past Surgical History  Procedure Laterality Date  . Hernia repair    . Cyst excision  back  . Cholecystectomy    . Mini-laparotomy w/ tubal ligation        Current Facility-Administered Medications  Medication Dose Route Frequency Provider Last Rate Last Dose  . atenolol (TENORMIN) tablet 100 mg  100 mg Oral Daily Robbie Lis, MD   100 mg at 03/15/14 1802  . [START ON 03/16/2014] ciprofloxacin (CIPRO) IVPB 400 mg  400 mg Intravenous Q12H Robbie Lis, MD      . Derrill Memo ON 03/16/2014] ciprofloxacin (CIPRO) IVPB 400 mg  400 mg  Intravenous Q12H Robbie Lis, MD      . diltiazem (CARDIZEM) tablet 120 mg  120 mg Oral Q12H Robbie Lis, MD      . enalapril (VASOTEC) tablet 20 mg  20 mg Oral Daily Robbie Lis, MD   20 mg at 03/15/14 1802  . [START ON 03/16/2014] insulin aspart (novoLOG) injection 0-9 Units  0-9 Units Subcutaneous TID WC Robbie Lis, MD      . metroNIDAZOLE (FLAGYL) IVPB 500 mg  500 mg Intravenous Q8H Robbie Lis, MD      . morphine 2 MG/ML injection 1 mg  1 mg Intravenous Q2H PRN Robbie Lis, MD   1 mg at 03/15/14 1803  . ondansetron (ZOFRAN) tablet 4 mg  4 mg Oral Q6H PRN Robbie Lis, MD       Or  . ondansetron [facility]) injection 4 mg  4 mg Intravenous Q6H PRN Robbie Lis, MD      . sodium chloride 0.9 % 1,000 mL with potassium chloride 40 mEq infusion   Intravenous Continuous Robbie Lis, MD 75 mL/hr at 03/15/14 1805        Allergies  Allergen Reactions  . Ace Inhibitors Cough  . Crestor [Rosuvastatin]   . Minoxidil   . Penicillins     unknwon  . Grapefruit Extract Rash    REVIEW OF SYSTEMS: Skin:  No history of rash.  No history of abnormal moles. Infection:  No history of hepatitis or HIV.  No history of MRSA. Neurologic:  No history of stroke.  No history of seizure.  No history of headaches. Cardiac:  HTN x 20 years.  No history of seeing a cardiologist. Pulmonary:  Does not smoke cigarettes.  No asthma or bronchitis.  No OSA/CPAP.  Endocrine:  Diabetes started treatment this past summer.  No thyroid disease. Gastrointestinal:  No history of stomach disease.  No history of liver disease.  No history of gall bladder disease.  See HPI Urologic:  Saw Dr. Junious Silk for UTI.  But nothing was found and he has released her. Musculoskeletal:  DJD and spinal stenosis.  She has seen someone with ortho, but he refused to do surgery about 2 years ago.  She has had at least one injection, but this did not help much Hematologic:  No bleeding disorder.  No history of anemia.  Not anticoagulated. Psycho-social:  The patient is oriented.   The patient has no obvious psychologic or social impairment to understanding our conversation and plan.  SOCIAL and FAMILY HISTORY: Widowed. Lives by self. 3 daughters:  2 in room with her - [name] (from [place]) and Sander Radon (from [place]).  PHYSICAL EXAM: BP 132/72 mmHg  Pulse 85  Temp(Src) 98.6 F (37 C) (Oral)  Resp 18  Ht 5\' 6"  (1.676 m)  Wt 153 lb 3.5 oz (69.5 kg)  BMI 24.74 kg/m2  SpO2 98%  General: WN thin older AA F who is alert. HEENT: Normal. Pupils equal. Neck: Supple. No mass.  No thyroid mass. Lymph Nodes:  No supraclavicular or cervical nodes. Lungs: Clear to auscultation  and symmetric breath sounds. Heart:  RRR. No murmur or rub. Abdomen: Soft. No mass.  Bowel sounds present.  Drain in RLQ. Rectal: Not done. Extremities:  Good strength and ROM  in upper and lower extremities. Neurologic:  Grossly intact to motor and sensory function. Psychiatric: Has normal mood and affect. Behavior is normal.   DATA REVIEWED: Epic notes  Shanon Brow  Lucia Gaskins, MD,  [facility] Surgery, [place] [address],  [place], [place]    [place] Phone:  [phone] FAX:  [phone]

## 2014-03-15 NOTE — H&P (Signed)
[facility] History and Physical  GLADIE GRAVETTE NIO:[id number] DOB: 27-Apr-1929 DOA: 03/15/2014  Referring physician: ER physician PCP: Alesia Richards, MD   Chief Complaint: nausea and vomiting   HPI:  80 y.o. female with history of diabetes mellitus, hypertension and hyperlipidemia who presented to [facility] ED on 03/15/14 with a ongoing nausea and vomiting for past 24 hours prior to this admission. Patient did not have abdominal pain at home but started to develop abdominal pain in ED. Patient reported no fevers or chills. No diarrhea. No blood per rectum.  In ED, pt was hemodynamically stable. CT abdomen showed complete resolution f RLQ abscess and new distal small bowel obstruction, mild wall thickening and ill defined wall of the entire colon with exception of the rectum which may be due to an acute colitis. NG tube was placed in ED> Pt admitted for further management of SBO.  Assessment & Plan     Principal Problem: Small bowel obstruction - Started supportive care with NG tube placement, IV fluids, antiemetics and analgesia as needed. - Keep NPO except for BP meds - Appreciate surgery consult and recommendations.   Active problems:   Bowel perforation / right lower quadrant abscess / leukocytosis  - on recent admission. Pt underwent percutaneous drainage placement and was discharged on cipro and flagyl for 7 days on discahrge. She will complete this by 03/16/2014.  Hypokalemia - Secondary to GI losses. Potassium supplemented through IV fluids.  - follow up BMP in am.  Essential hypertension - Continue home antihypertensive medications: atenolol 100 mg daily, enalapril 20 mg daily and Cardizem 120 mg PO Q 12 hours.   Diabetes mellitus, controlled with no complications - G1W in 29/9371 6.1 indicating good glycemic control - Hold metformin and use SSI for now.   DVT prophylaxis:   SCD's for DVT prophylaxis   Radiological Exams on Admission: Ct Abdomen Pelvis W  Contrast 03/15/2014  Percutaneous pigtail drainage catheter over the right lower quadrant unchanged in position as there has been complete resolution of the previously noted right lower quadrant abscess. The previously noted suspected inflamed appendix is now normal in caliber measuring 5-6 mm.  New distal small bowel obstructive process with transition point over the right lower quadrant medially below the cecum. Mild associated free fluid in the right mid to lower abdomen.  Mild wall thickening and ill definition of the wall of the entire colon with exception of the rectum which may be due to an acute colitis.  Other ancillary findings as described unchanged.  Code Status: Full Family Communication: Plan of care discussed with the patient and her daughter  Disposition Plan: Admit for further evaluation  Leisa Lenz, MD  [facility] Pager [phone]  Review of Systems:  Constitutional: Negative for fever, chills and malaise/fatigue. Negative for diaphoresis.  HENT: Negative for hearing loss, ear pain, nosebleeds, congestion, sore throat, neck pain, tinnitus and ear discharge.   Eyes: Negative for blurred vision, double vision, photophobia, pain, discharge and redness.  Respiratory: Negative for cough, hemoptysis, sputum production, shortness of breath, wheezing and stridor.   Cardiovascular: Negative for chest pain, palpitations, orthopnea, claudication and leg swelling.  Gastrointestinal: per HPI.  Genitourinary: Negative for dysuria, urgency, frequency, hematuria and flank pain.  Musculoskeletal: Negative for myalgias, back pain, joint pain and falls.  Skin: Negative for itching and rash.  Neurological: Negative for dizziness and weakness. Negative for tingling, tremors, sensory change, speech change, focal weakness, loss of consciousness and headaches.  Endo/Heme/Allergies: Negative for environmental allergies and  polydipsia. Does not bruise/bleed easily.  Psychiatric/Behavioral:  Negative for suicidal ideas. The patient is not nervous/anxious.      Past Medical History  Diagnosis Date  . Hypertension   . Hyperlipidemia   . Diabetes mellitus without complication   . Vitamin D deficiency   . Asthma   . DDD (degenerative disc disease)   . Spinal stenosis   . Type II or unspecified type diabetes mellitus without mention of complication, not stated as uncontrolled 03/22/2013   Past Surgical History  Procedure Laterality Date  . Hernia repair    . Cyst excision  back  . Cholecystectomy    . Mini-laparotomy w/ tubal ligation     Social History:  reports that she has quit smoking. She does not have any smokeless tobacco history on file. She reports that she does not drink alcohol or use illicit drugs.  Allergies  Allergen Reactions  . Ace Inhibitors Cough  . Crestor [Rosuvastatin]   . Minoxidil   . Penicillins     unknwon  . Grapefruit Extract Rash    Family History:  Family History  Problem Relation Age of Onset  . Heart attack Mother   . Cancer Mother     breast  . Hypertension Mother   . Heart attack Father   . Stroke Sister   . Cancer Sister     breast  . Heart attack Brother   . Heart attack Brother      Prior to Admission medications   Medication Sig Start Date End Date Taking? Authorizing Provider  aspirin 325 MG tablet Take 325 mg by mouth daily.   Yes Historical Provider, MD  atenolol (TENORMIN) 100 MG tablet TAKE 1 TABLET BY MOUTH DAILY FOR BLOOD PRESSURE 12/02/13  Yes Unk Pinto, MD  Cholecalciferol (VITAMIN D-3) 1000 UNITS CAPS Take 4,000 Units by mouth daily.   Yes Historical Provider, MD  ciprofloxacin (CIPRO) 500 MG tablet Take 1 tablet (500 mg total) by mouth 2 (two) times daily. 03/09/14  Yes Venetia Maxon Rama, MD  Cyanocobalamin (VITAMIN B-12) 2500 MCG SUBL Place 1 tablet under the tongue once a week. Thursdays   Yes Historical Provider, MD  diltiazem (CARDIZEM) 120 MG tablet TAKE 1 TABLET BY MOUTH TWICE DAILY FOR HIGH BLOOD  PRESSURE 08/03/13  Yes Vicie Mutters, PA-C  enalapril (VASOTEC) 20 MG tablet TAKE 1 TABLET BY MOUTH TWICE DAILY 01/28/14  Yes Unk Pinto, MD  Flaxseed, Linseed, (FLAXSEED OIL) 1000 MG CAPS Take 1,000 mg by mouth 4 (four) times daily.   Yes Historical Provider, MD  FLUZONE HIGH-DOSE 0.5 ML SUSY  12/09/13  Yes Historical Provider, MD  FREESTYLE LITE test strip 1 each by Other route daily.  01/28/14  Yes Historical Provider, MD  hydrochlorothiazide (HYDRODIURIL) 25 MG tablet TAKE 1 TABLET BY MOUTH EVERY MORNING 12/02/13  Yes Unk Pinto, MD  Lancets (FREESTYLE) lancets Test glucose 1 time daily 08/16/13  Yes Unk Pinto, MD  Magnesium Oxide (MAG-OX PO) Take 500 mg by mouth 2 (two) times daily.   Yes Historical Provider, MD  metFORMIN (GLUCOPHAGE XR) 500 MG 24 hr tablet Take 1 tablet with Breakfast AND Lunch and 2 tablets with Supper 03/12/14 03/13/15 Yes Unk Pinto, MD  metroNIDAZOLE (FLAGYL) 500 MG tablet Take 1 tablet (500 mg total) by mouth every 8 (eight) hours. 03/09/14  Yes Venetia Maxon Rama, MD  Multiple Vitamins-Minerals (MULTIVITAMIN & MINERAL PO) Take by mouth daily.   Yes Historical Provider, MD  naproxen sodium (ALEVE) 220 MG tablet Take  220 mg by mouth 2 (two) times daily.   Yes Historical Provider, MD  oxybutynin (DITROPAN) 5 MG tablet TAKE 1 TABLET BY MOUTH TWICE DAILY FOR BLADDER 01/28/14  Yes Unk Pinto, MD  simvastatin (ZOCOR) 40 MG tablet TAKE 1 TABLET BY MOUTH EVERY NIGHT AT BEDTIME FOR CHOLESTEROL 08/03/13  Yes Vicie Mutters, PA-C  acetaminophen (TYLENOL) 325 MG tablet Take 2 tablets (650 mg total) by mouth every 6 (six) hours as needed for mild pain (or Fever >/= 101). Patient not taking: Reported on 03/15/2014 03/09/14   Venetia Maxon Rama, MD  alendronate (FOSAMAX) 70 MG tablet Take 1 tablet (70 mg total) by mouth once a week. Take with a full glass of water on an empty stomach. Patient not taking: Reported on 03/15/2014 10/29/13   Unk Pinto, MD    Physical Exam: Filed Vitals:   03/15/14 1003 03/15/14 1449 03/15/14 1550  BP: 156/80 159/94 140/95  Pulse: 90 87 85  Temp: 98.5 F (36.9 C)  98.6 F (37 C)  TempSrc: Oral  Oral  Resp: 18 18 18   Height:   5\' 6"  (1.676 m)  Weight:   69.5 kg (153 lb 3.5 oz)  SpO2: 98% 99% 98%    Physical Exam  Constitutional: Appears well-developed and well-nourished. No distress.  HENT: Normocephalic. No tonsillar erythema or exudates Eyes: Conjunctivae and EOM are normal. PERRLA, no scleral icterus.  Neck: Normal ROM. Neck supple. No JVD. No tracheal deviation. No thyromegaly.  CVS: RRR, S1/S2 +, no murmurs, no gallops, no carotid bruit.  Pulmonary: Effort and breath sounds normal, no stridor, rhonchi, wheezes, rales.  Abdominal: Soft. BS +,  no distension, tenderness in mid abdomen, NG tube (+), RLQ drain (+) Musculoskeletal: Normal range of motion. No edema and no tenderness.  Lymphadenopathy: No lymphadenopathy noted, cervical, inguinal. Neuro: Alert. Normal reflexes, muscle tone coordination. No focal neurologic deficits. Skin: Skin is warm and dry. No rash noted.  No erythema. No pallor.  Psychiatric: Normal mood and affect. Behavior, judgment, thought content normal.   Labs on Admission:  Basic Metabolic Panel:  Recent Labs Lab 03/12/14 1635 03/15/14 1114  NA 135 134*  K 4.9 2.8*  CL 95* 102  CO2 29 25  GLUCOSE 118* 132*  BUN 21 17  CREATININE 0.96 0.66  CALCIUM 9.5 7.7*   Liver Function Tests:  Recent Labs Lab 03/15/14 1114  AST 23  ALT 12  ALKPHOS 62  BILITOT 0.7  PROT 6.1  ALBUMIN 2.8*    Recent Labs Lab 03/15/14 1114  LIPASE 12   No results for input(s): AMMONIA in the last 168 hours. CBC:  Recent Labs Lab 03/12/14 1635 03/15/14 1114  WBC 5.3 5.2  NEUTROABS 1.7 3.0  HGB 11.8* 9.1*  HCT 34.4* 27.2*  MCV 87.3 89.5  PLT 479* 339   Cardiac Enzymes: No results for input(s): CKTOTAL, CKMB, CKMBINDEX, TROPONINI in the last 168 hours. BNP: Invalid  input(s): POCBNP CBG:  Recent Labs Lab 03/09/14 0746 03/09/14 1143  GLUCAP 83 106*    If 7PM-7AM, please contact night-coverage [email or web address] Password [facility] 03/15/2014, 5:14 PM

## 2014-03-16 ENCOUNTER — Inpatient Hospital Stay (HOSPITAL_COMMUNITY): Payer: Medicare HMO

## 2014-03-16 DIAGNOSIS — G43A1 Cyclical vomiting, intractable: Secondary | ICD-10-CM

## 2014-03-16 LAB — COMPREHENSIVE METABOLIC PANEL
ALK PHOS: 63 U/L (ref 39–117)
ALT: 13 U/L (ref 0–35)
AST: 23 U/L (ref 0–37)
Albumin: 2.9 g/dL — ABNORMAL LOW (ref 3.5–5.2)
Anion gap: 4 — ABNORMAL LOW (ref 5–15)
BUN: 15 mg/dL (ref 6–23)
CALCIUM: 8.5 mg/dL (ref 8.4–10.5)
CO2: 32 mmol/L (ref 19–32)
Chloride: 97 mEq/L (ref 96–112)
Creatinine, Ser: 0.73 mg/dL (ref 0.50–1.10)
GFR calc Af Amer: 90 mL/min — ABNORMAL LOW (ref >90)
GFR calc non Af Amer: 77 mL/min — ABNORMAL LOW (ref >90)
Glucose, Bld: 95 mg/dL (ref 70–99)
Potassium: 3.7 mmol/L (ref 3.5–5.1)
SODIUM: 133 mmol/L — AB (ref 135–145)
TOTAL PROTEIN: 6.3 g/dL (ref 6.0–8.3)
Total Bilirubin: 0.4 mg/dL (ref 0.3–1.2)

## 2014-03-16 LAB — GLUCOSE, CAPILLARY
GLUCOSE-CAPILLARY: 89 mg/dL (ref 70–99)
Glucose-Capillary: 101 mg/dL — ABNORMAL HIGH (ref 70–99)
Glucose-Capillary: 112 mg/dL — ABNORMAL HIGH (ref 70–99)
Glucose-Capillary: 92 mg/dL (ref 70–99)

## 2014-03-16 LAB — CBC
HCT: 30.5 % — ABNORMAL LOW (ref 36.0–46.0)
HEMOGLOBIN: 10.2 g/dL — AB (ref 12.0–15.0)
MCH: 29.8 pg (ref 26.0–34.0)
MCHC: 33.4 g/dL (ref 30.0–36.0)
MCV: 89.2 fL (ref 78.0–100.0)
Platelets: 337 10*3/uL (ref 150–400)
RBC: 3.42 MIL/uL — AB (ref 3.87–5.11)
RDW: 14.7 % (ref 11.5–15.5)
WBC: 5.8 10*3/uL (ref 4.0–10.5)

## 2014-03-16 NOTE — Progress Notes (Signed)
Received call from [name], PAC in IR. Wants to hold flushes for abcess drain until drail assessed by IR tomorrow-12/28.Kristine Burnett

## 2014-03-16 NOTE — Progress Notes (Addendum)
General Surgery Note  LOS: 1 day  POD -     Assessment/Plan: 1. SBO Obstruction appears to be at the point of RLQ inflammation.  KUB shows continued loops of dilated SB.  Will repeat KUB tomorrow.  Continue NGT.  Needs to walk in hall.  2. Appendiceal abscess Perc drain on 03/06/2014 by Dr. Earleen Newport.  It appears that drain has not been irrigated since discharge from hospital last week.  On Cipro/Flayl IV   WBC - 5,800 - 03/16/2014 Discussed with Dr. Barbie Banner.  Plan to study drain tomorrow with possible removal.  3. Mild wall thickening of entire colon wall - unclear what this means. 4. HTN x 20 years 5. DM - just started meds this past summer 6. Weight loss over the last 6 months 7. DJD with spinal stenosis 8. Anemia - Hgb - 9.1 - 03/15/2014    Active Problems:   SBO (small bowel obstruction)   Abdominal visceral abscess   Nausea and vomiting   Hypokalemia   Subjective:  Feels good.  But no flatus.  Has NGT.  Daughter in room. Objective:   Filed Vitals:   03/16/14 0550  BP: 129/71  Pulse: 81  Temp: 98.1 F (36.7 C)  Resp: 16     Intake/Output from previous day:  12/26 0701 - 12/27 0700 In: 0  Out: 1900 [Urine:900; Emesis/NG output:1000]  Intake/Output this shift:      Physical Exam:   General: WN older AA F who is alert and oriented.    HEENT: Normal. Pupils equal. .   Lungs: Clear.   Abdomen: Soft.  BS present.  Drain in RLQ.     Lab Results:    Recent Labs  03/15/14 1114 03/16/14 0411  WBC 5.2 5.8  HGB 9.1* 10.2*  HCT 27.2* 30.5*  PLT 339 337    BMET   Recent Labs  03/15/14 1114 03/16/14 0411  NA 134* 133*  K 2.8* 3.7  CL 102 97  CO2 25 32  GLUCOSE 132* 95  BUN 17 15  CREATININE 0.66 0.73  CALCIUM 7.7* 8.5    PT/INR  No results for input(s): LABPROT, INR in the last 72 hours.  ABG  No results for input(s): PHART, HCO3 in the last 72 hours.  Invalid input(s): PCO2, PO2   Studies/Results:  Ct  Abdomen Pelvis W Contrast  03/15/2014   CLINICAL DATA:  Post recent abscess drainage procedure 03/06/2014 right lower quadrant. New onset pain, nausea and vomiting 1 day.  EXAM: CT ABDOMEN AND PELVIS WITH CONTRAST  TECHNIQUE: Multidetector CT imaging of the abdomen and pelvis was performed using the standard protocol following bolus administration of intravenous contrast.  CONTRAST:  65mL OMNIPAQUE IOHEXOL 300 MG/ML SOLN, 152mL OMNIPAQUE IOHEXOL 300 MG/ML SOLN  COMPARISON:  03/06/2014, 03/05/2014, and 02/26/2014 as well as 12/30/2008.  FINDINGS: Lung bases demonstrate mild focal bronchiectatic change over the right lower lobe.  Abdominal images demonstrate evidence of previous cholecystectomy with stable prominence of the common bile duct and central intrahepatic ducts. Liver is otherwise unremarkable. Spleen, pancreas and adrenal glands are within normal. Kidneys are normal in size with a few small sub cm right renal cortical hypodensities unchanged and too small to characterize but likely cysts. There is minimal calcified plaque over the abdominal aorta and iliac arteries.  Patient's percutaneous drainage catheter is seen entering the right lower anterior abdominal wall extending into the right lower quadrant/ upper pelvis unchanged in position. There has been resolution of the previously noted abscess in  the right lower quadrant. The previously suspected inflamed appendix has decreased in size and measures 5-6 mm in diameter.  There are multiple dilated small bowel loops compatible with a distal small bowel obstruction with transition point in the right lower quadrant just below the cecum. There is minimal wall thickening and ill definition of the wall of the entire colon with sparing of the rectum as cannot exclude an acute colitis. There is free fluid over the right lower quadrant.  Remaining pelvic structures are unchanged. Remaining bones and soft tissues are unchanged.  IMPRESSION: Percutaneous pigtail  drainage catheter over the right lower quadrant unchanged in position as there has been complete resolution of the previously noted right lower quadrant abscess. The previously noted suspected inflamed appendix is now normal in caliber measuring 5-6 mm.  New distal small bowel obstructive process with transition point over the right lower quadrant medially below the cecum. Mild associated free fluid in the right mid to lower abdomen.  Mild wall thickening and ill definition of the wall of the entire colon with exception of the rectum which may be due to an acute colitis.  Other ancillary findings as described unchanged.  These results were called by telephone at the time of interpretation on 03/15/2014 at 1:39 pm to Dr. Jola Schmidt , who verbally acknowledged these results.   Electronically Signed   By: Marin Olp M.D.   On: 03/15/2014 13:39     Anti-infectives:   Anti-infectives    Start     Dose/Rate Route Frequency Ordered Stop   03/16/14 1000  ciprofloxacin (CIPRO) IVPB 400 mg  Status:  Discontinued     400 mg200 mL/hr over 60 Minutes Intravenous Every 12 hours 03/15/14 2017 03/15/14 2048   03/16/14 1000  ciprofloxacin (CIPRO) IVPB 400 mg     400 mg200 mL/hr over 60 Minutes Intravenous Every 12 hours 03/15/14 2046 03/18/14 2159   03/15/14 2130  ciprofloxacin (CIPRO) IVPB 400 mg     400 mg200 mL/hr over 60 Minutes Intravenous  Once 03/15/14 2047 03/15/14 2157   03/15/14 2030  metroNIDAZOLE (FLAGYL) IVPB 500 mg     500 mg100 mL/hr over 60 Minutes Intravenous Every 8 hours 03/15/14 2017 03/18/14 2029      Alphonsa Overall, MD, FACS Pager: [facility] Office: [phone] 03/16/2014

## 2014-03-16 NOTE — Progress Notes (Addendum)
Patient ID: MALLIKA SANMIGUEL, female   DOB: 1929-08-29, 80 y.o.   MRN: [id number] [facility] PROGRESS NOTE  CHELCY BOLDA LYY:[id number] DOB: Jun 03, 1929 DOA: 03/15/2014 PCP: Alesia Richards, MD  Brief narrative:    80 y.o. female with history of diabetes mellitus, hypertension and hyperlipidemia who presented to [facility] ED on 03/15/14 with a ongoing nausea and vomiting for past 24 hours prior to this admission. Patient did not have abdominal pain at home but started to develop abdominal pain in ED. She was found to have SBO based on CT scan. She was hemodynamically stable in ED. NG tube was placed.   Assessment/Plan:    Principal Problem: Small bowel obstruction - Started supportive care with NG tube placement, IV fluids, antiemetics and analgesia as needed. - follow up abdominal x ray results - keep NPO - appreciate surgery recommendations.   Active problems:   Bowel perforation / right lower quadrant abscess / leukocytosis  - on recent admission. Pt underwent percutaneous drainage placement and was discharged on cipro and flagyl for 7 days on discahrge. She will complete this by 03/16/2014.  Hypokalemia - Secondary to GI losses. Potassium supplemented through IV fluids.  - potassium WNL  Essential hypertension - Continue home antihypertensive medications: atenolol 100 mg daily, enalapril 20 mg daily and Cardizem 120 mg PO Q 12 hours.   Diabetes mellitus, controlled with no complications - L2X in 51/7001 6.1 indicating good glycemic control - continue to hold metformin and use SSI for now.   DVT prophylaxis:   SCD's for DVT prophylaxis  Code Status: Full.  Family Communication:  plan of care discussed with the patient Disposition Plan: Home when stable.   IV access:  Peripheral IV  Procedures and diagnostic studies:    Ct Abdomen Pelvis W Contrast 03/15/2014 Percutaneous pigtail drainage catheter over the right lower quadrant unchanged in position as there has  been complete resolution of the previously noted right lower quadrant abscess. The previously noted suspected inflamed appendix is now normal in caliber measuring 5-6 mm. New distal small bowel obstructive process with transition point over the right lower quadrant medially below the cecum. Mild associated free fluid in the right mid to lower abdomen. Mild wall thickening and ill definition of the wall of the entire colon with exception of the rectum which may be due to an acute colitis. Other ancillary findings as described unchanged.  Dg Abd 2 Views 03/16/2014 Persistent small bowel obstruction.  COPD changes with bibasilar atelectasis.     Medical Consultants:  Surgery   Other Consultants:  None   IAnti-Infectives:   Cipro 03/15/2014 --> Flagyl 03/15/2014 -->   Leisa Lenz, MD  [facility] Pager [phone]  If 7PM-7AM, please contact night-coverage [email or web address] Password [email or web address] 03/16/2014, 2:00 PM   LOS: 1 day    HPI/Subjective: No acute overnight events.  Objective: Filed Vitals:   03/15/14 2020 03/15/14 2129 03/16/14 0550 03/16/14 1351  BP: 132/72 136/67 129/71 123/62  Pulse:  80 81 65  Temp:  98.2 F (36.8 C) 98.1 F (36.7 C) 98.4 F (36.9 C)  TempSrc:  Oral Oral Oral  Resp:  18 16 16   Height:      Weight:   69.809 kg (153 lb 14.4 oz)   SpO2:  98% 99% 100%    Intake/Output Summary (Last 24 hours) at 03/16/14 1400 Last data filed at 03/16/14 0600  Gross per 24 hour  Intake      0 ml  Output   1900 ml  Net  -1900 ml    Exam:   General:  Pt is alert, follows commands appropriately, not in acute distress  Cardiovascular: Regular rate and rhythm, S1/S2, no murmurs  Respiratory: Clear to auscultation bilaterally, no wheezing  Abdomen: Soft, non tender, non distended, bowel sounds present; NG tube in place   Extremities: No edema, pulses DP and PT palpable bilaterally  Neuro: Grossly nonfocal  Data Reviewed: Basic Metabolic Panel:  Recent  Labs Lab 03/12/14 1635 03/15/14 1114 03/16/14 0411  NA 135 134* 133*  K 4.9 2.8* 3.7  CL 95* 102 97  CO2 29 25 32  GLUCOSE 118* 132* 95  BUN 21 17 15   CREATININE 0.96 0.66 0.73  CALCIUM 9.5 7.7* 8.5   Liver Function Tests:  Recent Labs Lab 03/15/14 1114 03/16/14 0411  AST 23 23  ALT 12 13  ALKPHOS 62 63  BILITOT 0.7 0.4  PROT 6.1 6.3  ALBUMIN 2.8* 2.9*    Recent Labs Lab 03/15/14 1114  LIPASE 12   No results for input(s): AMMONIA in the last 168 hours. CBC:  Recent Labs Lab 03/12/14 1635 03/15/14 1114 03/16/14 0411  WBC 5.3 5.2 5.8  NEUTROABS 1.7 3.0  --   HGB 11.8* 9.1* 10.2*  HCT 34.4* 27.2* 30.5*  MCV 87.3 89.5 89.2  PLT 479* 339 337   Cardiac Enzymes: No results for input(s): CKTOTAL, CKMB, CKMBINDEX, TROPONINI in the last 168 hours. BNP: Invalid input(s): POCBNP CBG:  Recent Labs Lab 03/15/14 2229 03/16/14 0750 03/16/14 1138  GLUCAP 98 101* 112*    No results found for this or any previous visit (from the past 240 hour(s)).   Scheduled Meds: . atenolol  100 mg Oral Daily  . ciprofloxacin  400 mg Intravenous Q12H  . diltiazem  120 mg Oral Q12H  . enalapril  20 mg Oral Daily  . insulin aspart  0-9 Units Subcutaneous TID WC  . metronidazole  500 mg Intravenous Q8H   Continuous Infusions: . sodium chloride 0.9 % 1,000 mL with potassium chloride 40 mEq infusion 75 mL/hr at 03/16/14 1023

## 2014-03-16 NOTE — H&P (Signed)
Reason for Consult: Appendiceal abscess drain Chief Complaint: Chief Complaint  Patient presents with  . Nausea  . Emesis  . Abdominal Pain    Referring Physician(s): [facility]  History of Present Illness: Kristine Burnett is a 80 y.o. female s/p RLQ perc drain placed 12/17 for ruptured appendicitis, patient states she was discharged on 12/20 and drain had minimal output clear yellowish in color. She states home health was suppose to flush daily, but only did dressing changes. She denies any significant output since discharge < 1 cc each day and was scheduled for f/u with [facility] on 03/26/13. A CT was ordered for 5 days post discharge by [facility] and was never done, however she did present to ED 12/26 with c/o abdominal pain, nausea and vomiting since 12/25 and CT revealed resolved RLQ collection. CT also revealed SBO, she denies any flatus.   Past Medical History  Diagnosis Date  . Hypertension   . Hyperlipidemia   . Diabetes mellitus without complication   . Vitamin D deficiency   . Asthma   . DDD (degenerative disc disease)   . Spinal stenosis   . Type II or unspecified type diabetes mellitus without mention of complication, not stated as uncontrolled 03/22/2013    Past Surgical History  Procedure Laterality Date  . Hernia repair    . Cyst excision  back  . Cholecystectomy    . Mini-laparotomy w/ tubal ligation      Allergies: Ace inhibitors; Crestor; Minoxidil; Penicillins; and Grapefruit extract  Medications: Prior to Admission medications   Medication Sig Start Date End Date Taking? Authorizing Provider  aspirin 325 MG tablet Take 325 mg by mouth daily.   Yes Historical Provider, MD  atenolol (TENORMIN) 100 MG tablet TAKE 1 TABLET BY MOUTH DAILY FOR BLOOD PRESSURE 12/02/13  Yes Unk Pinto, MD  Cholecalciferol (VITAMIN D-3) 1000 UNITS CAPS Take 4,000 Units by mouth daily.   Yes Historical Provider, MD  ciprofloxacin (CIPRO) 500 MG tablet Take 1 tablet (500 mg total) by mouth 2 (two)  times daily. 03/09/14  Yes Venetia Maxon Rama, MD  Cyanocobalamin (VITAMIN B-12) 2500 MCG SUBL Place 1 tablet under the tongue once a week. Thursdays   Yes Historical Provider, MD  diltiazem (CARDIZEM) 120 MG tablet TAKE 1 TABLET BY MOUTH TWICE DAILY FOR HIGH BLOOD PRESSURE 08/03/13  Yes Vicie Mutters, PA-C  enalapril (VASOTEC) 20 MG tablet TAKE 1 TABLET BY MOUTH TWICE DAILY 01/28/14  Yes Unk Pinto, MD  Flaxseed, Linseed, (FLAXSEED OIL) 1000 MG CAPS Take 1,000 mg by mouth 4 (four) times daily.   Yes Historical Provider, MD  FLUZONE HIGH-DOSE 0.5 ML SUSY  12/09/13  Yes Historical Provider, MD  FREESTYLE LITE test strip 1 each by Other route daily.  01/28/14  Yes Historical Provider, MD  hydrochlorothiazide (HYDRODIURIL) 25 MG tablet TAKE 1 TABLET BY MOUTH EVERY MORNING 12/02/13  Yes Unk Pinto, MD  Lancets (FREESTYLE) lancets Test glucose 1 time daily 08/16/13  Yes Unk Pinto, MD  Magnesium Oxide (MAG-OX PO) Take 500 mg by mouth 2 (two) times daily.   Yes Historical Provider, MD  metFORMIN (GLUCOPHAGE XR) 500 MG 24 hr tablet Take 1 tablet with Breakfast AND Lunch and 2 tablets with Supper 03/12/14 03/13/15 Yes Unk Pinto, MD  metroNIDAZOLE (FLAGYL) 500 MG tablet Take 1 tablet (500 mg total) by mouth every 8 (eight) hours. 03/09/14  Yes Venetia Maxon Rama, MD  Multiple Vitamins-Minerals (MULTIVITAMIN & MINERAL PO) Take by mouth daily.   Yes Historical Provider, MD  naproxen sodium (ALEVE) 220 MG tablet Take 220 mg by mouth 2 (two) times daily.   Yes Historical Provider, MD  oxybutynin (DITROPAN) 5 MG tablet TAKE 1 TABLET BY MOUTH TWICE DAILY FOR BLADDER 01/28/14  Yes Unk Pinto, MD  simvastatin (ZOCOR) 40 MG tablet TAKE 1 TABLET BY MOUTH EVERY NIGHT AT BEDTIME FOR CHOLESTEROL 08/03/13  Yes Vicie Mutters, PA-C  acetaminophen (TYLENOL) 325 MG tablet Take 2 tablets (650 mg total) by mouth every 6 (six) hours as needed for mild pain (or Fever >/= 101). Patient not taking: Reported on  03/15/2014 03/09/14   Venetia Maxon Rama, MD  alendronate (FOSAMAX) 70 MG tablet Take 1 tablet (70 mg total) by mouth once a week. Take with a full glass of water on an empty stomach. Patient not taking: Reported on 03/15/2014 10/29/13   Unk Pinto, MD    Family History  Problem Relation Age of Onset  . Heart attack Mother   . Cancer Mother     breast  . Hypertension Mother   . Heart attack Father   . Stroke Sister   . Cancer Sister     breast  . Heart attack Brother   . Heart attack Brother     History   Social History  . Marital Status: Widowed    Spouse Name: N/A    Number of Children: N/A  . Years of Education: N/A   Social History Main Topics  . Smoking status: Former Research scientist (life sciences)  . Smokeless tobacco: None  . Alcohol Use: No  . Drug Use: No  . Sexual Activity: None   Other Topics Concern  . None   Social History Narrative   Review of Systems: A 12 point ROS discussed and pertinent positives are indicated in the HPI above.  All other systems are negative.  Review of Systems  Vital Signs: BP 129/71 mmHg  Pulse 81  Temp(Src) 98.1 F (36.7 C) (Oral)  Resp 16  Ht 5\' 6"  (1.676 m)  Wt 153 lb 14.4 oz (69.809 kg)  BMI 24.85 kg/m2  SpO2 99%  Physical Exam General: A&Ox3, NAD, NGT intact with dark green bile Abd: RLQ drain intact with gravity bag and no output.  Imaging: Ct Abdomen Pelvis W Contrast  03/15/2014   CLINICAL DATA:  Post recent abscess drainage procedure 03/06/2014 right lower quadrant. New onset pain, nausea and vomiting 1 day.  EXAM: CT ABDOMEN AND PELVIS WITH CONTRAST  TECHNIQUE: Multidetector CT imaging of the abdomen and pelvis was performed using the standard protocol following bolus administration of intravenous contrast.  CONTRAST:  31mL OMNIPAQUE IOHEXOL 300 MG/ML SOLN, 160mL OMNIPAQUE IOHEXOL 300 MG/ML SOLN  COMPARISON:  03/06/2014, 03/05/2014, and 02/26/2014 as well as 12/30/2008.  FINDINGS: Lung bases demonstrate mild focal bronchiectatic  change over the right lower lobe.  Abdominal images demonstrate evidence of previous cholecystectomy with stable prominence of the common bile duct and central intrahepatic ducts. Liver is otherwise unremarkable. Spleen, pancreas and adrenal glands are within normal. Kidneys are normal in size with a few small sub cm right renal cortical hypodensities unchanged and too small to characterize but likely cysts. There is minimal calcified plaque over the abdominal aorta and iliac arteries.  Patient's percutaneous drainage catheter is seen entering the right lower anterior abdominal wall extending into the right lower quadrant/ upper pelvis unchanged in position. There has been resolution of the previously noted abscess in the right lower quadrant. The previously suspected inflamed appendix has decreased in size and measures 5-6 mm in  diameter.  There are multiple dilated small bowel loops compatible with a distal small bowel obstruction with transition point in the right lower quadrant just below the cecum. There is minimal wall thickening and ill definition of the wall of the entire colon with sparing of the rectum as cannot exclude an acute colitis. There is free fluid over the right lower quadrant.  Remaining pelvic structures are unchanged. Remaining bones and soft tissues are unchanged.  IMPRESSION: Percutaneous pigtail drainage catheter over the right lower quadrant unchanged in position as there has been complete resolution of the previously noted right lower quadrant abscess. The previously noted suspected inflamed appendix is now normal in caliber measuring 5-6 mm.  New distal small bowel obstructive process with transition point over the right lower quadrant medially below the cecum. Mild associated free fluid in the right mid to lower abdomen.  Mild wall thickening and ill definition of the wall of the entire colon with exception of the rectum which may be due to an acute colitis.  Other ancillary findings as  described unchanged.  These results were called by telephone at the time of interpretation on 03/15/2014 at 1:39 pm to Dr. Jola Schmidt , who verbally acknowledged these results.   Electronically Signed   By: Marin Olp M.D.   On: 03/15/2014 13:39   Ct Abdomen Pelvis W Contrast  03/05/2014   CLINICAL DATA:  Diverticulitis, lower abdominal pain, constipation, tubal ligation, cholecystectomy.  EXAM: CT ABDOMEN AND PELVIS WITH CONTRAST  TECHNIQUE: Multidetector CT imaging of the abdomen and pelvis was performed using the standard protocol following bolus administration of intravenous contrast.  CONTRAST:  149mL OMNIPAQUE IOHEXOL 300 MG/ML  SOLN  COMPARISON:  02/26/2014  FINDINGS: There is a well-defined collection right pelvis adjacent to the external iliac vessels and bordering on the posterior lateral margin of the cecum. This collection measures 7.7 cm x 3.8 cm x 4.5 cm. It contains nondependent air, but is mostly fluid filled. Wall is relatively thick and there is adjacent inflammatory change. There is a smaller collection inferiorly adjacent to the right inguinal ligament measuring 2.8 cm x 2 cm in greatest transverse dimensions.  The wall of the adjacent cecum is thickened vertically along its posterior lateral margin. There is a dilated tubular structure that extends from the posterior cecal tip along the posterior lateral margin of the cecum. It measures 10.5 mm in diameter. This is likely to reflect a dilated appendix.  The small bubbles of free air noted on the prior study are no longer evident.  Heart is mildly enlarged. There is mild chronic bronchiectasis with associated scarring in the right lower lobe, stable.  Chronic dilation of the intra and extrahepatic biliary tree is noted. Common bile duct measures a maximum of 12 mm. There is also dilation of the cystic duct remnant in this patient has had a cholecystectomy. These findings are stable.  No liver mass or focal lesion. Spleen is unremarkable.  No pancreatic mass or inflammatory change. No adrenal masses. Low-density, nonenhancing right renal lesions are noted consistent with cysts. These are stable. Mild dilation of both renal pelvises and portions of the ureters. This is most likely due to depressed peristalsis due to the pelvic inflammation. No ureteral stone. Bladder is unremarkable.  No pathologically enlarged lymph nodes.  IMPRESSION: 1. Two adjacent collections have developed in the right pelvis since the prior exam. The largest measures 7.7 cm in greatest dimension. It contains fluid and nondependent air. The smaller collection lies below this  near the inguinal ligament measuring 2.8 cm in greatest dimension. The larger collection borders on the posterior lateral cecum. It also lies adjacent to a dilated tubular structure that appears to be blind-ending. This is concerning for a dilated appendix. There is surrounding inflammatory change. Findings are most suspicious for acute appendicitis with rupture and abscess formation. 2. Free air noted on the prior CT is no longer evident. 3. No other acute findings.   Electronically Signed   By: Lajean Manes M.D.   On: 03/05/2014 11:01   Ct Abdomen Pelvis W Contrast  02/26/2014   CLINICAL DATA:  Right lower quadrant pain common nausea comminuted and vomiting.  EXAM: CT ABDOMEN AND PELVIS WITH CONTRAST  TECHNIQUE: Multidetector CT imaging of the abdomen and pelvis was performed using the standard protocol following bolus administration of intravenous contrast.  CONTRAST:  48mL OMNIPAQUE IOHEXOL 300 MG/ML SOLN, 119mL OMNIPAQUE IOHEXOL 300 MG/ML SOLN  COMPARISON:  12/30/2008  FINDINGS: Mild dependent atelectasis in the lung bases. Mild central bronchiectasis in the right lung base. Residual contrast material in the lower esophagus likely represents dysmotility or reflux. No esophageal dilatation.  Surgical absence of the gallbladder intra and extrahepatic bile duct dilatation. Bile duct dilatation is likely  physiologic in the postoperative setting however noncalcified stone is not excluded. No focal liver lesions. The pancreas, spleen, adrenal glands, abdominal aorta, inferior vena cava, and retroperitoneal lymph nodes are unremarkable. 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 Distal vessel appears patent. Stomach is unremarkable. Small bowel are mostly decompressed. Gas and stool filled colon. Small amount of free fluid in the upper abdomen along the pericolic gutters and extending into the pelvis. With small free gas bubbles are demonstrated in the abdomen. Appearance suggests bowel perforation. Cause is not determined.  Pelvis: The bladder wall is not thickened. No pelvic mass or lymphadenopathy identified. Appendix is not visualized. There is infiltration and focal free air demonstrated in the right lower quadrant and given the history of right lower quadrant pain, a perforated appendix should be considered. No evidence of diverticulitis. Degenerative changes in the lumbar spine. Slight anterior subluxation of L4 on L5 and L3 on L4, likely degenerative.  IMPRESSION: Free fluid and small amount of free air in the abdomen suspicious for bowel perforation. Cause not identified. Appendix is not visualized but inflammatory changes are present in the right lower quadrant, possibly indicating perforated appendicitis. 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 Vascular compromise to the bowel is not excluded. Surgical absence of the gallbladder with prominent intra and extrahepatic bile duct dilatation. This is likely postoperative but occult noncalcified common duct stone is not excluded.  These results were called by telephone at the time of interpretation on 02/26/2014 at 10:25 pm to Dr. Blanchie Dessert , who verbally acknowledged these results.   Electronically Signed   By: Lucienne Capers M.D.   On: 02/26/2014  22:31   Ct Image Guided Drainage By Percutaneous Catheter  03/06/2014   CLINICAL DATA:  80 year old female hospitalized for right lower quadrant pain and abscess on CT imaging. She has been referred for percutaneous drainage.  EXAM: CT GUIDED DRAINAGE OF PELVIC ABSCESS  ANESTHESIA/SEDATION: 4.0 Mg IV Versed 50 mcg IV Fentanyl  Total Moderate Sedation Time:  20 minutes  PROCEDURE: The procedure, risks, benefits, and alternatives were explained to the patient. Questions regarding the procedure were encouraged and answered. The patient understands and consents to the procedure.  Scout CT of the pelvis was acquired for planning purposes.  The right lower quadrant was prepped with Betadinein a sterile fashion, and a sterile drape was applied covering the operative field. A sterile gown and sterile gloves were used for the procedure. Local anesthesia was provided with 1% Lidocaine.  Once the patient is prepped and draped sterilely and the skin and subcutaneous tissues were generously infiltrated with 1% lidocaine for local anesthesia, a fine needle was used to identify safe approach into the right lower quadrant/ pelvic abscess.  A small stab incision was made with 11 blade scalpel, and a trocar needle was advanced under CT guidance into the collection. Once we confirmed position, the stylet was removed and small amount of purulent fluid was aspirated to confirm. An 035 Amplatz wire was advanced to the needle and the needle was removed. Serial dilation of the tract with 8 [place] and 10 French drains was performed. Finally, a 70 French pigtail catheter was advanced over the wire into the abscess.  Approximately 85 cc of frank pus was aspirated. A sample was sent to lab.  The catheter was sutured in position with a 2-0 Prolene suture. A drainage bag was attached.  The patient tolerated the procedure well and remained hemodynamically stable throughout.  No complications were encountered and no significant blood loss was  encountered.  COMPLICATIONS: None  FINDINGS: Initial CT demonstrates fluid and gas collection in the right lower quadrant, compatible with abscess and present on the comparison.  Images during the case demonstrate safe placement of 12 French drain into the collection.  Final CT image demonstrates collapse of the cavity after aspiration of 85 cc of frank pus.  IMPRESSION: Status post CT-guided drainage of right lower quadrant abscess. A 12 French pigtail catheter was placed.  85 cc of frank pus was aspirated with a sample sent to the lab for culture.  Signed,  Dulcy Fanny. Earleen Newport, DO  [facility]  [facility]   Electronically Signed   By: Corrie Mckusick D.O.   On: 03/06/2014 11:30    Labs:  CBC:  Recent Labs  03/07/14 0448 03/12/14 1635 03/15/14 1114 03/16/14 0411  WBC 9.4 5.3 5.2 5.8  HGB 10.2* 11.8* 9.1* 10.2*  HCT 30.6* 34.4* 27.2* 30.5*  PLT 233 479* 339 337    COAGS:  Recent Labs  03/05/14 1315  INR 1.06  APTT 36    BMP:  Recent Labs  03/07/14 0448 03/12/14 1635 03/15/14 1114 03/16/14 0411  NA 138 135 134* 133*  K 4.4 4.9 2.8* 3.7  CL 99 95* 102 97  CO2 28 29 25  32  GLUCOSE 96 118* 132* 95  BUN 6 21 17 15   CALCIUM 9.1 9.5 7.7* 8.5  CREATININE 0.66 0.96 0.66 0.73  GFRNONAA 80* 55* 80* 77*  GFRAA >90 64 >90 90*    LIVER FUNCTION TESTS:  Recent Labs  02/26/14 2006 02/27/14 0455 03/15/14 1114 03/16/14 0411  BILITOT 0.4 0.4 0.7 0.4  AST 60* 46* 23 23  ALT 57* 47* 12 13  ALKPHOS 225* 177* 62 63  PROT 7.6 6.9 6.1 6.3  ALBUMIN 3.5 3.0* 2.8* 2.9*   Assessment and Plan: SBO, symptoms since 03/14/14 Perforated Appendicitis with abscess s/p perc drain placed 12/17, discharged 12/20 [place] ordered F/U CT for 5 days post discharge F/u scheduled with [facility] for 03/26/13 CT 12/26 in ED with resolved RLQ collection Discharged with orders to flush daily, patient states home health did come and change dressing  but never flushed  since discharge and 1-2cc of clear yellow output is all she has seen at home. Scheduled for drain injection in IR tomorrow, will wait to flush drain until injection, d/w RN     I spent a total of 20 minutes face to face in clinical consultation, greater than 50% of which was counseling/coordinating care   Signed: Hedy Jacob 03/16/2014, 9:52 AM

## 2014-03-16 NOTE — Progress Notes (Signed)
Subjective:    Patient ID: Kristine Burnett, female    DOB: Jul 16, 1929, 80 y.o.   MRN: [id number]  HPI  Very nice 80 yo WBF for hospital f/u after hospitalization Dec 9- 20 for perforated viscus - Patient was treated medically and discharged in an improved state on antibiotics and presented today for close f/u feeling somewhat better w/o sx's  N/V/D/C/bloating. Apparently she did receive SS Insulin while hospitalized and was advise post hospital to monitor CBG's & resume previous meds.    Medication Sig  . acetaminophen (TYLENOL) 325 MG tablet Take 2 tablets (650 mg total) by mouth every 6 (six) hours as needed for mild pain (or Fever >/= 101). (Patient not taking: Reported on 03/15/2014)  . alendronate (FOSAMAX) 70 MG tablet Take 1 tablet (70 mg total) by mouth once a week. Take with a full glass of water on an empty stomach. (Patient not taking: Reported on 03/15/2014)  . aspirin 325 MG tablet Take 325 mg by mouth daily.  Marland Kitchen atenolol (TENORMIN) 100 MG tablet TAKE 1 TABLET BY MOUTH DAILY FOR BLOOD PRESSURE  . Cholecalciferol (VITAMIN D-3) 1000 UNITS CAPS Take 4,000 Units by mouth daily.  . ciprofloxacin (CIPRO) 500 MG tablet Take 1 tablet (500 mg total) by mouth 2 (two) times daily.  . Cyanocobalamin (VITAMIN B-12) 2500 MCG SUBL Place 1 tablet under the tongue once a week. Thursdays  . diltiazem (CARDIZEM) 120 MG tablet TAKE 1 TABLET BY MOUTH TWICE DAILY FOR HIGH BLOOD PRESSURE  . enalapril (VASOTEC) 20 MG tablet TAKE 1 TABLET BY MOUTH TWICE DAILY  . Flaxseed, Linseed, (FLAXSEED OIL) 1000 MG CAPS Take 1,000 mg by mouth 4 (four) times daily.  . Hydrochlorothiazide 25 MG tablet TAKE 1 TABLET BY MOUTH EVERY MORNING  . Lancets (FREESTYLE) lancets Test glucose 1 time daily  . Magnesium Oxide (MAG-OX PO) Take 500 mg by mouth 2 (two) times daily.  . metroNIDAZOLE (FLAGYL) 500 MG  Take 1 tablet (500 mg total) by mouth every 8 (eight) hours.  . Multiple Vitamins-Minerals ( Take by mouth daily.  Marland Kitchen  oxybutynin (DITROPAN) 5 MG tablet TAKE 1 TABLET BY MOUTH TWICE DAILY FOR BLADDER  . simvastatin (ZOCOR) 40 MG tablet TAKE 1 TABLET BY MOUTH EVERY NIGHT   . metFORMIN (GLUCOPHAGE) 500 MG tablet Take 1 tablet (500 mg total) by mouth 2 (two) times daily with a meal.  . naproxen sodium (ANAPROX) 220 MG tablet Take 220 mg by mouth 2 (two) times daily with a meal.  . baclofen (LIORESAL) 10 MG tablet TAKE 1/2 TO 1 TABLET BY MOUTH THREE TIMES DAILY AS NEEDED FOR MUSCLE SPASM   Allergies  Allergen Reactions  . Ace Inhibitors Cough  . Crestor [Rosuvastatin]   . Minoxidil   . Penicillins     unknwon  . Grapefruit Extract Rash   Review of Systems In addition to the HPI above,  No Fever-chills,  No Headache, No changes with Vision or hearing,  No problems swallowing food or Liquids,  No Chest pain or productive Cough or Shortness of Breath,  No Abdominal pain, No Nausea or Vomitting, Bowel movements are regular,  No Blood in stool or Urine, No dysuria,  No new skin rashes or bruises,  No new joints pains-aches,  No new weakness, tingling, numbness in any extremity,  No recent weight loss,  No polyuria, polydypsia or polyphagia,  No significant Mental Stressors.  A full 10 point Review of Systems was done, except as stated above, all other  Review of Systems were negative     Objective:   Physical Exam     BP 144/72 m  P 64  Temp 98.6 F   Resp 16  Ht 5\' 6"   Wt 154 lb 12.8 oz   BMI 25.00   HEENT - Eac's patent. TM's Nl. EOM's full. PERRLA. NasoOroPharynx clear. Neck - supple. Nl Thyroid. Carotids 2+ & No bruits, nodes, JVD Chest - Clear equal BS w/o Rales, rhonchi, wheezes. Cor - Nl HS. RRR w/o sig MGR. PP 1(+). No edema. Abd - Soft w/o palpable  masses or tenderness. BS nl. MS- FROM w/o deformities. Muscle power, tone and bulk Nl. Gait Nl. Neuro - No obvious Cr N abnormalities. Sensory, motor and Cerebellar functions appear Nl w/o focal abnormalities. Psyche - Mental status normal &  appropriate.    Assessment & Plan:   1. Essential hypertension   2. CKD stage 2 due to type 2 diabetes mellitus   3. Encounter for long-term (current) use of medications  - CBC with Differential - BASIC METABOLIC PANEL WITH GFR

## 2014-03-17 ENCOUNTER — Inpatient Hospital Stay (HOSPITAL_COMMUNITY): Payer: Medicare HMO

## 2014-03-17 LAB — GLUCOSE, CAPILLARY
GLUCOSE-CAPILLARY: 82 mg/dL (ref 70–99)
GLUCOSE-CAPILLARY: 78 mg/dL (ref 70–99)
Glucose-Capillary: 97 mg/dL (ref 70–99)
Glucose-Capillary: 59 mg/dL — ABNORMAL LOW (ref 70–99)

## 2014-03-17 LAB — CBC WITH DIFFERENTIAL/PLATELET
Basophils Absolute: 0 10*3/uL (ref 0.0–0.1)
Basophils Relative: 0 % (ref 0–1)
EOS PCT: 2 % (ref 0–5)
Eosinophils Absolute: 0.1 10*3/uL (ref 0.0–0.7)
HCT: 28.7 % — ABNORMAL LOW (ref 36.0–46.0)
HEMOGLOBIN: 9.7 g/dL — AB (ref 12.0–15.0)
LYMPHS ABS: 2.3 10*3/uL (ref 0.7–4.0)
Lymphocytes Relative: 40 % (ref 12–46)
MCH: 30.6 pg (ref 26.0–34.0)
MCHC: 33.8 g/dL (ref 30.0–36.0)
MCV: 90.5 fL (ref 78.0–100.0)
Monocytes Absolute: 0.7 10*3/uL (ref 0.1–1.0)
Monocytes Relative: 13 % — ABNORMAL HIGH (ref 3–12)
Neutro Abs: 2.6 10*3/uL (ref 1.7–7.7)
Neutrophils Relative %: 45 % (ref 43–77)
Platelets: 324 10*3/uL (ref 150–400)
RBC: 3.17 MIL/uL — ABNORMAL LOW (ref 3.87–5.11)
RDW: 15.1 % (ref 11.5–15.5)
WBC: 5.8 10*3/uL (ref 4.0–10.5)

## 2014-03-17 LAB — BASIC METABOLIC PANEL
ANION GAP: 2 — AB (ref 5–15)
BUN: 11 mg/dL (ref 6–23)
CO2: 31 mmol/L (ref 19–32)
Calcium: 8.2 mg/dL — ABNORMAL LOW (ref 8.4–10.5)
Chloride: 103 mEq/L (ref 96–112)
Creatinine, Ser: 0.59 mg/dL (ref 0.50–1.10)
GFR calc Af Amer: >90 mL/min (ref >90)
GFR calc non Af Amer: 83 mL/min — ABNORMAL LOW (ref >90)
GLUCOSE: 82 mg/dL (ref 70–99)
Potassium: 4 mmol/L (ref 3.5–5.1)
SODIUM: 136 mmol/L (ref 135–145)

## 2014-03-17 MED ORDER — SODIUM CHLORIDE 0.9 % IV SOLN: INTRAVENOUS | Status: DC

## 2014-03-17 MED ORDER — DEXTROSE-NACL 5-0.9 % IV SOLN
INTRAVENOUS | Status: AC
  Administered 2014-03-17 – 2014-03-18 (×2): via INTRAVENOUS

## 2014-03-17 MED ORDER — IOHEXOL 300 MG/ML  SOLN
10.0000 mL | Freq: Once | INTRAMUSCULAR | Status: AC | PRN
  Administered 2014-03-17: 10 mL

## 2014-03-17 MED ORDER — SODIUM CHLORIDE 0.9 % IV SOLN
INTRAVENOUS | Status: DC
  Administered 2014-03-17: 20:00:00 via INTRAVENOUS

## 2014-03-17 MED ORDER — SODIUM CHLORIDE 0.9 % IV SOLN
INTRAVENOUS | Status: DC
Start: 2014-03-17 — End: 2014-03-17

## 2014-03-17 NOTE — Procedures (Signed)
Contrast injection of pt's RLQ abscess drain demonstrates persistent communication with the adjacent cecum. As such, the drain was not removed. Gravity bag reconnected.   Would recommend to continue NOT to flush drain to encourage fistula healing. If pt discharged and pt deemed not a surgical candidate, she could go to [facility] IR drain clinic on 1/12 as clinically indicated.

## 2014-03-17 NOTE — Plan of Care (Signed)
Problem: Phase I Progression Outcomes Goal: Other Phase I Outcomes/Goals Outcome: Progressing Patient oob in chair most of the day and ambulated in hallway twice on day shift. No flatus yet. C/o gas pains

## 2014-03-17 NOTE — Progress Notes (Signed)
Subjective: She looks fine says she hasn't been able to eat or have BM since 12/25.  As always very pleasant about all of this.  Objective: Vital signs in last 24 hours: Temp:  [98.2 F (36.8 C)-98.7 F (37.1 C)] 98.7 F (37.1 C) (12/28 0530) Pulse Rate:  [64-75] 72 (12/28 0530) Resp:  [16-18] 18 (12/28 0530) BP: (123-158)/(53-62) 158/62 mmHg (12/28 0530) SpO2:  [98 %-100 %] 98 % (12/28 0530) Weight:  [70.3 kg (154 lb 15.7 oz)] 70.3 kg (154 lb 15.7 oz) (12/28 0530) Last BM Date: 03/13/14 1325 from the NG NPO Afebrile, VSS Labs OK this AM Intake/Output from previous day: 12/27 0701 - 12/28 0700 In: 2209 [P.O.:60; I.V.:1699; NG/GT:50; IV Piggyback:400] Out: 3950 [Urine:2625; Emesis/NG output:1325] Intake/Output this shift: Total I/O In: 30 [NG/GT:30] Out: 350 [Urine:300; Emesis/NG output:50]  General appearance: alert, cooperative and no distress GI: sore RLQ, NG working not allot of BS.  drain is clear right now.  Lab Results:   Recent Labs  03/16/14 0411 03/17/14 0505  WBC 5.8 5.8  HGB 10.2* 9.7*  HCT 30.5* 28.7*  PLT 337 324    BMET  Recent Labs  03/16/14 0411 03/17/14 0505  NA 133* 136  K 3.7 4.0  CL 97 103  CO2 32 31  GLUCOSE 95 82  BUN 15 11  CREATININE 0.73 0.59  CALCIUM 8.5 8.2*   PT/INR No results for input(s): LABPROT, INR in the last 72 hours.   Recent Labs Lab 03/15/14 1114 03/16/14 0411  AST 23 23  ALT 12 13  ALKPHOS 62 63  BILITOT 0.7 0.4  PROT 6.1 6.3  ALBUMIN 2.8* 2.9*     Lipase     Component Value Date/Time   LIPASE 12 03/15/2014 1114     Studies/Results: Ct Abdomen Pelvis W Contrast  03/15/2014   CLINICAL DATA:  Post recent abscess drainage procedure 03/06/2014 right lower quadrant. New onset pain, nausea and vomiting 1 day.  EXAM: CT ABDOMEN AND PELVIS WITH CONTRAST  TECHNIQUE: Multidetector CT imaging of the abdomen and pelvis was performed using the standard protocol following bolus administration of  intravenous contrast.  CONTRAST:  27mL OMNIPAQUE IOHEXOL 300 MG/ML SOLN, 171mL OMNIPAQUE IOHEXOL 300 MG/ML SOLN  COMPARISON:  03/06/2014, 03/05/2014, and 02/26/2014 as well as 12/30/2008.  FINDINGS: Lung bases demonstrate mild focal bronchiectatic change over the right lower lobe.  Abdominal images demonstrate evidence of previous cholecystectomy with stable prominence of the common bile duct and central intrahepatic ducts. Liver is otherwise unremarkable. Spleen, pancreas and adrenal glands are within normal. Kidneys are normal in size with a few small sub cm right renal cortical hypodensities unchanged and too small to characterize but likely cysts. There is minimal calcified plaque over the abdominal aorta and iliac arteries.  Patient's percutaneous drainage catheter is seen entering the right lower anterior abdominal wall extending into the right lower quadrant/ upper pelvis unchanged in position. There has been resolution of the previously noted abscess in the right lower quadrant. The previously suspected inflamed appendix has decreased in size and measures 5-6 mm in diameter.  There are multiple dilated small bowel loops compatible with a distal small bowel obstruction with transition point in the right lower quadrant just below the cecum. There is minimal wall thickening and ill definition of the wall of the entire colon with sparing of the rectum as cannot exclude an acute colitis. There is free fluid over the right lower quadrant.  Remaining pelvic structures are unchanged. Remaining bones and soft  tissues are unchanged.  IMPRESSION: Percutaneous pigtail drainage catheter over the right lower quadrant unchanged in position as there has been complete resolution of the previously noted right lower quadrant abscess. The previously noted suspected inflamed appendix is now normal in caliber measuring 5-6 mm.  New distal small bowel obstructive process with transition point over the right lower quadrant medially  below the cecum. Mild associated free fluid in the right mid to lower abdomen.  Mild wall thickening and ill definition of the wall of the entire colon with exception of the rectum which may be due to an acute colitis.  Other ancillary findings as described unchanged.  These results were called by telephone at the time of interpretation on 03/15/2014 at 1:39 pm to Dr. Jola Schmidt , who verbally acknowledged these results.   Electronically Signed   By: Marin Olp M.D.   On: 03/15/2014 13:39   Ir Sinus/fist Tube Chk-non Gi  03/17/2014   CLINICAL DATA:  History of ruptured appendicitis, post CT-guided percutaneous drainage catheter placement on 03/06/2014. Patient now admitted with small bowel obstruction. Patient reports only 1-2 cc of drainage from the percutaneous drainage catheter. The patient is not flushing the drainage catheter. Please perform a drain injection to evaluate for persistent fistulous connection.  EXAM: SINUS TRACT INJECTION/FISTULOGRAM  COMPARISON:  CT abdomen and pelvis - 03/15/2014; 03/05/2014; 02/26/2014; CT-guided percutaneous drainage catheter placement - 03/06/2014  CONTRAST:  20 cc Omnipaque 300-injected via the existing right lower quadrant percutaneous drainage catheter.  FLUOROSCOPY TIME:  1 min, 18 seconds (17 mGy).  TECHNIQUE: The patient was positioned supine on the fluoroscopy table.  A preprocedural spot radiographic image was obtained of the right lower abdomen and the existing percutaneous drainage catheter. Multiple spot fluoroscopic and radiographic images were obtained following the injection of a small amount of contrast via the existing percutaneous drainage catheter in various obliquities.  The drainage catheter was flushed with a small amount of saline and reconnected to a gravity bag. A dressing was placed. The patient tolerated the procedure well without immediate postprocedural complication.  FINDINGS: Preprocedural spot radiographic image demonstrates grossly  unchanged positioning of the percutaneous drainage catheter overlying the right lower abdominal quadrant.  Contrast injection demonstrates a persistent fistulous connection with the end of the percutaneous drainage catheter and the adjacent cecum. This was confirmed with subsequent delayed images and evaluated in various obliquities.  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  PLAN: - Would recommend to continue to NOT flush the percutaneous drainage catheter to encourage fistulous tract healing.  - If the patient is discharged and ultimately not deemed a surgical candidate, the patient may attend the drain clinic at the [place] [facility] on 04/01/2014 as clinically indicated.   Electronically Signed   By: Sandi Mariscal M.D.   On: 03/17/2014 11:01   Dg Abd 2 Views  03/16/2014   CLINICAL DATA:  Small bowel obstruction  EXAM: ABDOMEN - 2 VIEW  COMPARISON:  CT abdomen and pelvis 03/15/2014  FINDINGS: Nasogastric tube tip projects over mid stomach.  Persistent air-filled minimally distended loops of small bowel in LEFT upper quadrant consistent with small bowel obstruction.  No bowel wall thickening or free intraperitoneal air.  Lung bases are emphysematous with bibasilar atelectasis.  Surgical clips RIGHT upper quadrant question cholecystectomy.  Pigtail drainage catheter RIGHT pelvis again seen.  Excreted contrast material within bladder from prior CT.  Bones demineralized degenerative disc disease changes of  the lumbar spine.  IMPRESSION: Persistent small bowel obstruction.  COPD changes with bibasilar atelectasis.   Electronically Signed   By: Lavonia Dana M.D.   On: 03/16/2014 11:12    Medications: . atenolol  100 mg Oral Daily  . ciprofloxacin  400 mg Intravenous Q12H  . diltiazem  120 mg Oral Q12H  . enalapril  20 mg Oral Daily  . insulin aspart  0-9 Units Subcutaneous TID WC  .  metronidazole  500 mg Intravenous Q8H    Assessment/Plan 1. SBO 2. Appendiceal abscess, Perc drain on 03/06/2014 by Dr. Earleen Newport, Abscess appears to have resolved.  IR injection on 03/07/14 shows pt's RLQ abscess drain demonstrates persistent communication with the adjacent cecum.  As such, the drain was not removed.Gravity bag reconnected. Would recommend to continue NOT to flush drain to encourage fistula healing.   If pt discharged and pt deemed not a surgical candidate, she could go to [facility] IR drain clinic on 1/12 as clinically indicated.       3. Mild wall thickening of entire colon wall - unclear what this means. 4. HTN x 20 years 5. DM - just started meds this past summer 6. Weight loss over the last 6 months 7. DJD with spinal stenosis 8. Anemia - Hgb - 9.1 - 03/15/2014    Plan:  Continue bowel rest recheck labs and may want to consider TNA if she doesn't turn around soon. She would is on SCD for DVT, consider Lovenox or heparin also.   LOS: 2 days    Kristine Burnett 03/17/2014

## 2014-03-17 NOTE — Progress Notes (Addendum)
Patient ID: Kristine Burnett, female   DOB: 03-Sep-1929, 80 y.o.   MRN: [id number] [facility] PROGRESS NOTE  Kristine Burnett TDD:[id number] DOB: Feb 20, 1930 DOA: 03/15/2014 PCP: Alesia Richards, MD  Brief narrative:     80 y.o. female with history of diabetes mellitus, hypertension and hyperlipidemia who presented to [facility] ED on 03/15/14 with a ongoing nausea and vomiting for past 24 hours prior to this admission. Patient did not have abdominal pain at home but started to develop abdominal pain in ED. She was found to have SBO based on CT scan. She was hemodynamically stable in ED. NG tube was placed.  Interventional radiology evaluated if current percutaneous drain can be removed. 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 Per IR, would recommend to continue to NOT flush the percutaneous drainage catheter to encourage fistulous tract healing.  Assessment/Plan:     Principal Problem: Small bowel obstruction - Started supportive care with NG tube placement, IV fluids, antiemetics and analgesia as needed. - abdominal x ray showed SBO - appreciate surgery following  - keep NPO, NG tube in place    Active problems:   Bowel perforation / right lower quadrant abscess / leukocytosis / possible colitis - on recent admission. Pt underwent percutaneous drainage placement and was discharged on cipro and flagyl for 7 days on discahrge. Will continue cipro and flagyl IV for treatment of possible colitis and in regards to an abscess it looks like it has resolved based on CT. However, based on fluoroscopy still persistent fistulous connection seen of the RLQ and adjacent cecum. Will not remove drain.  Hypokalemia - Secondary to GI losses. Potassium supplemented through IV fluids.  - potassium WNL  Essential hypertension - Continue home antihypertensive medications: atenolol 100 mg daily, enalapril 20 mg daily and  Cardizem 120 mg PO Q 12 hours.   Diabetes mellitus, controlled with no complications - W2B in 76/2831 6.1 indicating good glycemic control - continue to hold metformin and use SSI  - CBG's in past 24 hours: 92, 89, 82    DVT prophylaxis:   SCD's for DVT prophylaxis  Code Status: Full.  Family Communication:  plan of care discussed with the patient Disposition Plan: Home when stable.   IV access:  Peripheral IV  Procedures and diagnostic studies:    Ct Abdomen Pelvis W Contrast 03/15/2014 Percutaneous pigtail drainage catheter over the right lower quadrant unchanged in position as there has been complete resolution of the previously noted right lower quadrant abscess. The previously noted suspected inflamed appendix is now normal in caliber measuring 5-6 mm. New distal small bowel obstructive process with transition point over the right lower quadrant medially below the cecum. Mild associated free fluid in the right mid to lower abdomen. Mild wall thickening and ill definition of the wall of the entire colon with exception of the rectum which may be due to an acute colitis. Other ancillary findings as described unchanged.  Dg Abd 2 Views 03/16/2014 Persistent small bowel obstruction.  COPD changes with bibasilar atelectasis.    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 PLAN: Would recommend to continue to NOT flush the percutaneous drainage catheter to encourage fistulous tract healing. If the patient is discharged and ultimately not deemed a surgical candidate, the patient may attend the drain clinic at the [place] [facility] on 04/01/2014 as clinically indicated.   Medical  Consultants:  Surgery  Interventional radiology   Other Consultants:  None   IAnti-Infectives:   Cipro 03/15/2014 --> Flagyl 03/15/2014 -->   Leisa Lenz, MD  [facility] Pager [phone]  If 7PM-7AM, please contact night-coverage [email or web address] Password [facility] 03/17/2014, 11:56 AM   LOS: 2 days    HPI/Subjective: No acute overnight events.  Objective: Filed Vitals:   03/16/14 1351 03/16/14 1953 03/16/14 2001 03/17/14 0530  BP: 123/62 136/53  158/62  Pulse: 65 75 64 72  Temp: 98.4 F (36.9 C) 98.2 F (36.8 C)  98.7 F (37.1 C)  TempSrc: Oral Oral  Oral  Resp: 16 18  18   Height:      Weight:    70.3 kg (154 lb 15.7 oz)  SpO2: 100% 98%  98%    Intake/Output Summary (Last 24 hours) at 03/17/14 1156 Last data filed at 03/17/14 1000  Gross per 24 hour  Intake   2209 ml  Output   3900 ml  Net  -1691 ml    Exam:   General:  Pt is alert, follows commands appropriately, not in acute distress  Cardiovascular: Regular rate and rhythm, S1/S2, no murmurs  Respiratory: Clear to auscultation bilaterally, no wheezing, no crackles, no rhonchi  Abdomen: Soft, non tender, non distended, bowel sounds present; NG tube in place  Extremities: No edema, pulses DP and PT palpable bilaterally  Neuro: Grossly nonfocal  Data Reviewed: Basic Metabolic Panel:  Recent Labs Lab 03/12/14 1635 03/15/14 1114 03/16/14 0411 03/17/14 0505  NA 135 134* 133* 136  K 4.9 2.8* 3.7 4.0  CL 95* 102 97 103  CO2 29 25 32 31  GLUCOSE 118* 132* 95 82  BUN 21 17 15 11   CREATININE 0.96 0.66 0.73 0.59  CALCIUM 9.5 7.7* 8.5 8.2*   Liver Function Tests:  Recent Labs Lab 03/15/14 1114 03/16/14 0411  AST 23 23  ALT 12 13  ALKPHOS 62 63  BILITOT 0.7 0.4  PROT 6.1 6.3  ALBUMIN 2.8* 2.9*    Recent Labs Lab 03/15/14 1114  LIPASE 12   No results for input(s): AMMONIA in the last 168 hours. CBC:  Recent Labs Lab 03/12/14 1635 03/15/14 1114 03/16/14 0411 03/17/14 0505  WBC 5.3 5.2 5.8 5.8  NEUTROABS 1.7 3.0  --  2.6  HGB 11.8* 9.1* 10.2* 9.7*  HCT 34.4* 27.2* 30.5* 28.7*  MCV 87.3 89.5 89.2 90.5  PLT 479* 339 337 324   Cardiac  Enzymes: No results for input(s): CKTOTAL, CKMB, CKMBINDEX, TROPONINI in the last 168 hours. BNP: Invalid input(s): POCBNP CBG:  Recent Labs Lab 03/16/14 0750 03/16/14 1138 03/16/14 1656 03/16/14 2101 03/17/14 0726  GLUCAP 101* 112* 92 89 82    No results found for this or any previous visit (from the past 240 hour(s)).   Scheduled Meds: . atenolol  100 mg Oral Daily  . ciprofloxacin  400 mg Intravenous Q12H  . diltiazem  120 mg Oral Q12H  . enalapril  20 mg Oral Daily  . insulin aspart  0-9 Units Subcutaneous TID WC  . metronidazole  500 mg Intravenous Q8H   Continuous Infusions: . sodium chloride 0.9 % 1,000 mL with potassium chloride 40 mEq infusion 75 mL/hr at 03/17/14 0424

## 2014-03-18 DIAGNOSIS — R112 Nausea with vomiting, unspecified: Secondary | ICD-10-CM

## 2014-03-18 LAB — GLUCOSE, CAPILLARY
Glucose-Capillary: 105 mg/dL — ABNORMAL HIGH (ref 70–99)
Glucose-Capillary: 124 mg/dL — ABNORMAL HIGH (ref 70–99)
Glucose-Capillary: 142 mg/dL — ABNORMAL HIGH (ref 70–99)
Glucose-Capillary: 150 mg/dL — ABNORMAL HIGH (ref 70–99)
Glucose-Capillary: 168 mg/dL — ABNORMAL HIGH (ref 70–99)

## 2014-03-18 LAB — COMPREHENSIVE METABOLIC PANEL
ALBUMIN: 2.7 g/dL — AB (ref 3.5–5.2)
ALT: 14 U/L (ref 0–35)
ANION GAP: 6 (ref 5–15)
AST: 25 U/L (ref 0–37)
Alkaline Phosphatase: 55 U/L (ref 39–117)
BILIRUBIN TOTAL: 0.6 mg/dL (ref 0.3–1.2)
BUN: 8 mg/dL (ref 6–23)
CHLORIDE: 104 meq/L (ref 96–112)
CO2: 26 mmol/L (ref 19–32)
CREATININE: 0.55 mg/dL (ref 0.50–1.10)
Calcium: 8.2 mg/dL — ABNORMAL LOW (ref 8.4–10.5)
GFR calc Af Amer: >90 mL/min (ref >90)
GFR calc non Af Amer: 85 mL/min — ABNORMAL LOW (ref >90)
Glucose, Bld: 97 mg/dL (ref 70–99)
Potassium: 3.4 mmol/L — ABNORMAL LOW (ref 3.5–5.1)
Sodium: 136 mmol/L (ref 135–145)
TOTAL PROTEIN: 6.1 g/dL (ref 6.0–8.3)

## 2014-03-18 LAB — PREALBUMIN: Prealbumin: 17.4 mg/dL — ABNORMAL LOW (ref 17.0–34.0)

## 2014-03-18 LAB — CBC
HCT: 28.9 % — ABNORMAL LOW (ref 36.0–46.0)
Hemoglobin: 9.6 g/dL — ABNORMAL LOW (ref 12.0–15.0)
MCH: 30.1 pg (ref 26.0–34.0)
MCHC: 33.2 g/dL (ref 30.0–36.0)
MCV: 90.6 fL (ref 78.0–100.0)
PLATELETS: 302 10*3/uL (ref 150–400)
RBC: 3.19 MIL/uL — ABNORMAL LOW (ref 3.87–5.11)
RDW: 15.4 % (ref 11.5–15.5)
WBC: 5.1 10*3/uL (ref 4.0–10.5)

## 2014-03-18 LAB — MAGNESIUM: Magnesium: 1.4 mg/dL — ABNORMAL LOW (ref 1.5–2.5)

## 2014-03-18 MED ORDER — SODIUM CHLORIDE 0.9 % IJ SOLN
10.0000 mL | Freq: Two times a day (BID) | INTRAMUSCULAR | Status: DC
  Administered 2014-03-18 – 2014-03-24 (×4): 10 mL

## 2014-03-18 MED ORDER — POTASSIUM CHLORIDE 10 MEQ/100ML IV SOLN
10.0000 meq | INTRAVENOUS | Status: AC
  Administered 2014-03-18 (×3): 10 meq via INTRAVENOUS
  Filled 2014-03-18 (×3): qty 100

## 2014-03-18 MED ORDER — SODIUM CHLORIDE 0.9 % IV SOLN
INTRAVENOUS | Status: AC
  Administered 2014-03-19: via INTRAVENOUS

## 2014-03-18 MED ORDER — SODIUM CHLORIDE 0.9 % IJ SOLN: 10.0000 mL | INTRAMUSCULAR | Status: DC | PRN

## 2014-03-18 MED ORDER — LIP MEDEX EX OINT
TOPICAL_OINTMENT | CUTANEOUS | Status: AC
  Administered 2014-03-18: 14:00:00
  Filled 2014-03-18: qty 7

## 2014-03-18 MED ORDER — TRACE MINERALS CR-CU-F-FE-I-MN-MO-SE-ZN IV SOLN
INTRAVENOUS | Status: AC
  Administered 2014-03-18: 17:00:00 via INTRAVENOUS
  Filled 2014-03-18: qty 1000

## 2014-03-18 MED ORDER — INSULIN ASPART 100 UNIT/ML ~~LOC~~ SOLN
0.0000 [IU] | SUBCUTANEOUS | Status: DC
  Administered 2014-03-18: 1 [IU] via SUBCUTANEOUS
  Administered 2014-03-19: 2 [IU] via SUBCUTANEOUS
  Administered 2014-03-19: 1 [IU] via SUBCUTANEOUS
  Administered 2014-03-19: 2 [IU] via SUBCUTANEOUS

## 2014-03-18 MED ORDER — POTASSIUM CHLORIDE 10 MEQ/100ML IV SOLN
10.0000 meq | INTRAVENOUS | Status: DC
  Filled 2014-03-18 (×3): qty 100

## 2014-03-18 MED ORDER — FAT EMULSION 20 % IV EMUL
240.0000 mL | INTRAVENOUS | Status: AC
  Administered 2014-03-18: 240 mL via INTRAVENOUS
  Filled 2014-03-18: qty 250

## 2014-03-18 MED ORDER — ENOXAPARIN SODIUM 40 MG/0.4ML ~~LOC~~ SOLN
40.0000 mg | SUBCUTANEOUS | Status: DC
  Administered 2014-03-18 – 2014-03-24 (×7): 40 mg via SUBCUTANEOUS
  Filled 2014-03-18 (×7): qty 0.4

## 2014-03-18 MED ORDER — MAGNESIUM SULFATE 2 GM/50ML IV SOLN
2.0000 g | Freq: Once | INTRAVENOUS | Status: AC
  Administered 2014-03-18: 2 g via INTRAVENOUS
  Filled 2014-03-18: qty 50

## 2014-03-18 NOTE — Progress Notes (Signed)
Subjective: She looks good, but dreaming of flatus, no flatus or BM.  Abdomen is soft, she didn't get to walk much yesterday.  Not very distended.  Objective: Vital signs in last 24 hours: Temp:  [98.4 F (36.9 C)-98.5 F (36.9 C)] 98.4 F (36.9 C) (12/29 0440) Pulse Rate:  [65-78] 65 (12/29 0440) Resp:  [16-18] 18 (12/29 0440) BP: (139-149)/(57-77) 141/59 mmHg (12/29 0440) SpO2:  [98 %-99 %] 98 % (12/29 0440) Weight:  [69.854 kg (154 lb)] 69.854 kg (154 lb) (12/29 0440) Last BM Date: 03/09/14 NG down to 525,  No BM recorded Urine output also recorded as 650, one shift recorded only Afebrile,  VSS K+ 3.4 Film yesterday shows SBO the same, contrast in the cecum from the injection of the drain. Intake/Output from previous day: 12/28 0701 - 12/29 0700 In: 330 [NG/GT:30; IV Piggyback:300] Out: 1175 [Urine:650; Emesis/NG output:525] Intake/Output this shift: Total I/O In: 0  Out: 100 [Urine:100]  General appearance: alert, cooperative and no distress Resp: clear to auscultation bilaterally GI: soft, not really distended or uncomfortable, no flatus, no BM, no BS  Lab Results:   Recent Labs  03/17/14 0505 03/18/14 0530  WBC 5.8 5.1  HGB 9.7* 9.6*  HCT 28.7* 28.9*  PLT 324 302    BMET  Recent Labs  03/17/14 0505 03/18/14 0530  NA 136 136  K 4.0 3.4*  CL 103 104  CO2 31 26  GLUCOSE 82 97  BUN 11 8  CREATININE 0.59 0.55  CALCIUM 8.2* 8.2*   PT/INR No results for input(s): LABPROT, INR in the last 72 hours.   Recent Labs Lab 03/15/14 1114 03/16/14 0411 03/18/14 0530  AST 23 23 25   ALT 12 13 14   ALKPHOS 62 63 55  BILITOT 0.7 0.4 0.6  PROT 6.1 6.3 6.1  ALBUMIN 2.8* 2.9* 2.7*     Lipase     Component Value Date/Time   LIPASE 12 03/15/2014 1114     Studies/Results: Ir Sinus/fist Tube Chk-non Gi  03/17/2014   CLINICAL DATA:  History of ruptured appendicitis, post CT-guided percutaneous drainage catheter placement on 03/06/2014. Patient now  admitted with small bowel obstruction. Patient reports only 1-2 cc of drainage from the percutaneous drainage catheter. The patient is not flushing the drainage catheter. Please perform a drain injection to evaluate for persistent fistulous connection.  EXAM: SINUS TRACT INJECTION/FISTULOGRAM  COMPARISON:  CT abdomen and pelvis - 03/15/2014; 03/05/2014; 02/26/2014; CT-guided percutaneous drainage catheter placement - 03/06/2014  CONTRAST:  20 cc Omnipaque 300-injected via the existing right lower quadrant percutaneous drainage catheter.  FLUOROSCOPY TIME:  1 min, 18 seconds (17 mGy).  TECHNIQUE: The patient was positioned supine on the fluoroscopy table.  A preprocedural spot radiographic image was obtained of the right lower abdomen and the existing percutaneous drainage catheter. Multiple spot fluoroscopic and radiographic images were obtained following the injection of a small amount of contrast via the existing percutaneous drainage catheter in various obliquities.  The drainage catheter was flushed with a small amount of saline and reconnected to a gravity bag. A dressing was placed. The patient tolerated the procedure well without immediate postprocedural complication.  FINDINGS: Preprocedural spot radiographic image demonstrates grossly unchanged positioning of the percutaneous drainage catheter overlying the right lower abdominal quadrant.  Contrast injection demonstrates a persistent fistulous connection with the end of the percutaneous drainage catheter and the adjacent cecum. This was confirmed with subsequent delayed images and evaluated in various obliquities.  IMPRESSION: Persistent fistulous connection of the right  lower quadrant percutaneous drainage catheter and the adjacent cecum.  PLAN: - Would recommend to continue to NOT flush the percutaneous drainage catheter to encourage fistulous tract healing.  - If the patient is discharged and ultimately not deemed a surgical candidate, the patient may  attend the drain clinic at the [place] [facility] on 04/01/2014 as clinically indicated.   Electronically Signed   By: Sandi Mariscal M.D.   On: 03/17/2014 11:01   Dg Abd 2 Views  03/17/2014   CLINICAL DATA:  Followup partial small bowel obstruction. Patient has an indwelling drainage catheter in the right lower quadrant from a previous abscess, and a persistent fistulous communication with the abscess cavity and cecum was demonstrated earlier today upon catheter injection.  EXAM: ABDOMEN - 2 VIEW  COMPARISON:  Images obtained at the time of abscess a Gram earlier today. Two-view abdomen x-rays yesterday. CT abdomen and pelvis 03/15/2014, 03/06/2014.  FINDINGS: Contrast material in the cecum and ascending colon from the abscessogram earlier today. Multiple dilated loops of small bowel throughout the abdomen, demonstrating air-fluid levels on the erect image, not significantly changed since yesterday. No evidence of free intraperitoneal air. Surgical clips in right upper quadrant from prior cholecystectomy. Nasogastric tube tip projects over the expected location of the body of the stomach.  IMPRESSION: Stable partial small bowel obstruction since yesterday. No free intraperitoneal air.   Electronically Signed   By: Evangeline Dakin M.D.   On: 03/17/2014 14:35    Medications: . atenolol  100 mg Oral Daily  . ciprofloxacin  400 mg Intravenous Q12H  . diltiazem  120 mg Oral Q12H  . enalapril  20 mg Oral Daily  . insulin aspart  0-9 Units Subcutaneous TID WC  . metronidazole  500 mg Intravenous Q8H  . potassium chloride  10 mEq Intravenous Q1 Hr x 3    Assessment/Plan 1. SBO 2. Appendiceal abscess, Perc drain on 03/06/2014 by Dr. Earleen Newport, Abscess appears to have resolved. IR injection on 03/07/14 shows pt's RLQ abscess drain demonstrates persistent communication with the adjacent cecum. As such, the drain was not removed.Gravity bag reconnected. Would recommend to  continue NOT to flush drain to encourage fistula healing.  3. Mild wall thickening of entire colon wall - unclear what this means. 4. HTN x 20 years 5. DM - just started meds this past summer 6. Weight loss over the last 6 months 7. DJD with spinal stenosis 8. Anemia - Hgb - 9.1 - 03/15/2014    Plan:  I am going to start TNA, her NG regulator isn't working and I have ask them to change it.  PUT her NG in further, and recheck films in AM. Pharmacy to start lovenox for DVT prophylaxis.       LOS: 3 days    Kristine Burnett 03/18/2014

## 2014-03-18 NOTE — Progress Notes (Addendum)
Patient ID: Kristine Burnett, female   DOB: 1929-11-16, 80 y.o.   MRN: [id number] [facility] PROGRESS NOTE  TIERRE GERARD SEG:[id number] DOB: 12-30-1929 DOA: 03/15/2014 PCP: Alesia Richards, Kristine Burnett  Brief narrative:    80 y.o. female with history of diabetes mellitus, hypertension and hyperlipidemia who presented to [facility] ED on 03/15/14 with a ongoing nausea and vomiting for past 24 hours prior to this admission. Patient did not have abdominal pain at home but started to develop abdominal pain in ED. She was found to have SBO based on CT scan. She was hemodynamically stable in ED. NG tube was placed.   Interventional radiology evaluated if current percutaneous drain can be removed. 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 Per IR, would recommend to continue to NOT flush the percutaneous drainage catheter to encourage fistulous tract healing.  Assessment/Plan:     Principal Problem: Small bowel obstruction  Started supportive care with NG tube placement, IV fluids, antiemetics and analgesia as needed.  She has less NG tube output this am; will follow up on surgery recommendations.  She is still NPO as of this morning.  Active problems:   Bowel perforation / right lower quadrant abscess / leukocytosis / possible colitis  On recent admission. Pt underwent percutaneous drainage placement and was discharged on cipro and flagyl for 7 days on discahrge. Will continue cipro and flagyl IV for treatment of possible colitis and in regards to an abscess it looks like it has resolved based on CT. However, based on fluoroscopy still persistent fistulous connection seen of the RLQ and adjacent cecum. Will not remove drain.  Patient still receiving cipro and flagyl.   Hypokalemia  Secondary to GI losses. Potassium supplemented.   Essential hypertension  Continue home antihypertensive medications: atenolol 100 mg  daily, enalapril 20 mg daily and Cardizem 120 mg PO Q 12 hours.   Diabetes mellitus, controlled with no complications  V3X in 12/6267 6.1 indicating good glycemic control  Continue to hold metformin and use SSI.  CBG's in past 24 hours: 78, 59, 124    DVT prophylaxis:   SCD's for DVT prophylaxis  Code Status: Full.  Family Communication:  plan of care discussed with the patient and her grandson at the bedside  Disposition Plan: Home when stable.   IV access:  Peripheral IV  Procedures and diagnostic studies:    Ct Abdomen Pelvis W Contrast 03/15/2014 Percutaneous pigtail drainage catheter over the right lower quadrant unchanged in position as there has been complete resolution of the previously noted right lower quadrant abscess. The previously noted suspected inflamed appendix is now normal in caliber measuring 5-6 mm. New distal small bowel obstructive process with transition point over the right lower quadrant medially below the cecum. Mild associated free fluid in the right mid to lower abdomen. Mild wall thickening and ill definition of the wall of the entire colon with exception of the rectum which may be due to an acute colitis. Other ancillary findings as described unchanged.  Dg Abd 2 Views 03/16/2014 Persistent small bowel obstruction.  COPD changes with bibasilar atelectasis.    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 PLAN: Would recommend to continue to NOT flush the percutaneous drainage catheter to encourage fistulous tract healing. If the patient is discharged and ultimately not deemed a surgical candidate, the patient may attend the drain clinic at the [facility] on 04/01/2014 as clinically indicated.  Dg Abd 2 Views 03/17/2014 Stable partial small bowel obstruction since yesterday. No free intraperitoneal air.  Medical  Consultants:  Surgery  Interventional radiology   Other Consultants:  None   IAnti-Infectives:   Cipro 03/15/2014 --> Flagyl 03/15/2014 -->  Kristine Lenz, Kristine Burnett  [facility] Pager [phone]  If 7PM-7AM, please contact night-coverage [email or web address] Password [facility] 03/18/2014, 6:32 AM   LOS: 3 days    HPI/Subjective: No acute overnight events.  Objective: Filed Vitals:   03/17/14 0530 03/17/14 1400 03/17/14 2044 03/18/14 0440  BP: 158/62 149/77 139/57 141/59  Pulse: 72 66 78 65  Temp: 98.7 F (37.1 C) 98.5 F (36.9 C) 98.5 F (36.9 C) 98.4 F (36.9 C)  TempSrc: Oral Oral Oral Oral  Resp: 18 18 16 18   Height:      Weight: 70.3 kg (154 lb 15.7 oz)   69.854 kg (154 lb)  SpO2: 98% 99% 99% 98%    Intake/Output Summary (Last 24 hours) at 03/18/14 0258 Last data filed at 03/18/14 0441  Gross per 24 hour  Intake    330 ml  Output   1175 ml  Net   -845 ml    Exam:   General:  Pt is alert, follows commands appropriately, not in acute distress  Cardiovascular: Regular rate and rhythm, S1/S2, no murmurs  Respiratory: no wheezing, bilateral air entry   Abdomen: Soft, non tender, non distended, bowel sounds present; (+) NG tube in place   Extremities: No edema, pulses DP and PT palpable bilaterally  Neuro: Grossly nonfocal  Data Reviewed: Basic Metabolic Panel:  Recent Labs Lab 03/12/14 1635 03/15/14 1114 03/16/14 0411 03/17/14 0505 03/18/14 0530  NA 135 134* 133* 136 136  K 4.9 2.8* 3.7 4.0 3.4*  CL 95* 102 97 103 104  CO2 29 25 32 31 26  GLUCOSE 118* 132* 95 82 97  BUN 21 17 15 11 8   CREATININE 0.96 0.66 0.73 0.59 0.55  CALCIUM 9.5 7.7* 8.5 8.2* 8.2*   Liver Function Tests:  Recent Labs Lab 03/15/14 1114 03/16/14 0411 03/18/14 0530  AST 23 23 25   ALT 12 13 14   ALKPHOS 62 63 55  BILITOT 0.7 0.4 0.6  PROT 6.1 6.3 6.1  ALBUMIN 2.8* 2.9* 2.7*    Recent Labs Lab 03/15/14 1114  LIPASE 12   No results for input(s): AMMONIA in the  last 168 hours. CBC:  Recent Labs Lab 03/12/14 1635 03/15/14 1114 03/16/14 0411 03/17/14 0505 03/18/14 0530  WBC 5.3 5.2 5.8 5.8 5.1  NEUTROABS 1.7 3.0  --  2.6  --   HGB 11.8* 9.1* 10.2* 9.7* 9.6*  HCT 34.4* 27.2* 30.5* 28.7* 28.9*  MCV 87.3 89.5 89.2 90.5 90.6  PLT 479* 339 337 324 302   Cardiac Enzymes: No results for input(s): CKTOTAL, CKMB, CKMBINDEX, TROPONINI in the last 168 hours. BNP: Invalid input(s): POCBNP CBG:  Recent Labs Lab 03/16/14 2101 03/17/14 0726 03/17/14 1216 03/17/14 1626 03/17/14 2108  GLUCAP 89 82 97 78 59*    No results found for this or any previous visit (from the past 240 hour(s)).   Scheduled Meds: . atenolol  100 mg Oral Daily  . ciprofloxacin  400 mg Intravenous Q12H  . diltiazem  120 mg Oral Q12H  . enalapril  20 mg Oral Daily  . insulin aspart  0-9 Units Subcutaneous TID WC  . metronidazole  500 mg Intravenous Q8H  . potassium chloride  10 mEq  Intravenous Q1 Hr x 3   Continuous Infusions: . dextrose 5 % and 0.9% NaCl 75 mL/hr at 03/17/14 2238

## 2014-03-18 NOTE — Progress Notes (Signed)
Pharmacy protocol: Lovenox Indication: VTE prophylaxis  A: 18 yoF with recent bowel perforation 2/2 appendicitis or diverticulitis, s/p IR guided drain placement on 12/17. Now with SBO. Pharmacy consulted to start lovenox for VTE px. No issues noted with renal fxn. Hgb low, stable. Plts WNL.   P: Start lovenox 40mg  SQ q24h. Pharmacy will sign-off, please re-consult if needed  Currie Paris, PharmD, BCPS Pager: [phone] Pharmacy: [phone] 03/18/2014 10:33 AM

## 2014-03-18 NOTE — Progress Notes (Signed)
INITIAL NUTRITION ASSESSMENT  DOCUMENTATION CODES Per approved criteria  -Non-severe (moderate) malnutrition in the context of acute illness or injury  Pt meets criteria for moderate MALNUTRITION in the context of acute illness as evidenced by moderate fat and muscle depletion and 11% weight loss x 6 months.  INTERVENTION: -TPN per pharmacy -Diet advancement per MD  -RD to continue to monitor  NUTRITION DIAGNOSIS: Inadequate oral intake related to inability to eat as evidenced by NPO status.   Goal: Pt to meet >/= 90% of their estimated nutrition needs   Monitor:  TPN initiation and tolerance, weight, labs, I/O's  Reason for Assessment: Consult for TPN  Admitting Dx: Nausea and Vomiting  ASSESSMENT: 80 y.o. female with history of diabetes mellitus, hypertension and hyperlipidemia who presented to [facility] ED on 03/15/14 with a ongoing nausea and vomiting for past 24 hours prior to this admission. Patient did not have abdominal pain at home but started to develop abdominal pain in ED.  Pt NPO d/t bowel rest. Picc line placed and TPN to be initiated today at 1800. Spoke with pharmacist about pt, provided nutritional recommendations.  Pt reports eating well prior to christmas day when she stopped tolerating foods. States that food just "wasn't digesting", so she would make herself vomit.   Pt with significant weight loss, 19 lb since June (11% weight loss x 6 months).   Nutrition Focused Physical Exam:  Subcutaneous Fat:  Orbital Region: WNL Upper Arm Region: moderate depletion Thoracic and Lumbar Region: NA  Muscle:  Temple Region: WNL Clavicle Bone Region: moderate depletion Clavicle and Acromion Bone Region: moderate depletion Scapular Bone Region: mild depletion Dorsal Hand: WNL Patellar Region: WNL Anterior Thigh Region: WNL Posterior Calf Region: mild depletion  Edema: no LE edema  Labs reviewed: Low K & Mg  Height: Ht Readings from Last 1 Encounters:  03/15/14  5\' 6"  (1.676 m)    Weight: Wt Readings from Last 1 Encounters:  03/18/14 154 lb (69.854 kg)    Ideal Body Weight: 130 lb  % Ideal Body Weight: 118%  Wt Readings from Last 10 Encounters:  03/18/14 154 lb (69.854 kg)  03/12/14 154 lb 12.8 oz (70.217 kg)  03/07/14 158 lb 4.8 oz (71.804 kg)  12/25/13 163 lb (73.936 kg)  11/27/13 160 lb (72.576 kg)  09/17/13 173 lb 9.6 oz (78.744 kg)  06/25/13 182 lb (82.555 kg)  05/21/13 183 lb (83.008 kg)  03/22/13 184 lb 6.4 oz (83.643 kg)    Usual Body Weight: 182 lb  % Usual Body Weight: 85%  BMI:  Body mass index is 24.87 kg/(m^2).  Estimated Nutritional Needs: Kcal: 1900-2100 Protein: 90-100g Fluid: 1.9L/day  Skin: abdominal catheter  Diet Order: Diet NPO time specified  EDUCATION NEEDS: -No education needs identified at this time   Intake/Output Summary (Last 24 hours) at 03/18/14 1159 Last data filed at 03/18/14 0915  Gross per 24 hour  Intake    300 ml  Output   1175 ml  Net   -875 ml    Last BM: 12/20  Labs:   Recent Labs Lab 03/16/14 0411 03/17/14 0505 03/18/14 0530  NA 133* 136 136  K 3.7 4.0 3.4*  CL 97 103 104  CO2 32 31 26  BUN 15 11 8   CREATININE 0.73 0.59 0.55  CALCIUM 8.5 8.2* 8.2*  MG  --   --  1.4*  GLUCOSE 95 82 97    CBG (last 3)   Recent Labs  03/17/14 1626 03/17/14 2108 03/18/14  0825  GLUCAP 78 59* 124*    Scheduled Meds: . atenolol  100 mg Oral Daily  . diltiazem  120 mg Oral Q12H  . enalapril  20 mg Oral Daily  . enoxaparin (LOVENOX) injection  40 mg Subcutaneous Q24H  . insulin aspart  0-9 Units Subcutaneous TID WC  . metronidazole  500 mg Intravenous Q8H    Continuous Infusions: . dextrose 5 % and 0.9% NaCl 75 mL/hr at 03/17/14 2238    Past Medical History  Diagnosis Date  . Hypertension   . Hyperlipidemia   . Diabetes mellitus without complication   . Vitamin D deficiency   . Asthma   . DDD (degenerative disc disease)   . Spinal stenosis   . Type II or  unspecified type diabetes mellitus without mention of complication, not stated as uncontrolled 03/22/2013    Past Surgical History  Procedure Laterality Date  . Hernia repair    . Cyst excision  back  . Cholecystectomy    . Mini-laparotomy w/ tubal ligation      Clayton Bibles, MS, RD, LDN Pager: [phone] After Hours Pager: [phone]

## 2014-03-18 NOTE — Progress Notes (Signed)
Suction  regulator changed, ngt draining well with dark brown fluid.Patient ambulated in hallway 3x today,Picc line in and TPN started.

## 2014-03-18 NOTE — Progress Notes (Signed)
Peripherally Inserted Central Catheter/Midline Placement  The IV Nurse has discussed with the patient and/or persons authorized to consent for the patient, the purpose of this procedure and the potential benefits and risks involved with this procedure.  The benefits include less needle sticks, lab draws from the catheter and patient may be discharged home with the catheter.  Risks include, but not limited to, infection, bleeding, blood clot (thrombus formation), and puncture of an artery; nerve damage and irregular heat beat.  Alternatives to this procedure were also discussed.  PICC/Midline Placement Documentation        Kristine Burnett 03/18/2014, 12:22 PM

## 2014-03-18 NOTE — Progress Notes (Addendum)
Came to visit patient at bedside on behalf of her [employer]. She was recently discharged from hospital. During last hospitalization, patient wanted to speak with her daughter about [employer] prior to consenting to services. At bedside visit today, patient still did not want to consent. States her daughter is at work and will not get off until 5pm. Asked that information be left again for her daughter to decide. Of note, patient does not want me to call her daughter at this time since she is at work. Patient's grandson was at bedside and he states he will give the packet to his aunt to over look. Will follow up tomorrow. Gave contact information for patient's daughter to call as well with questions.  Will follow up. Made inpatient RNCM aware.  Marthenia Rolling, [facility] [phone]

## 2014-03-18 NOTE — Progress Notes (Signed)
PARENTERAL NUTRITION CONSULT NOTE - INITIAL  Pharmacy Consult for TPN Indication: SBO  Allergies  Allergen Reactions  . Ace Inhibitors Cough  . Crestor [Rosuvastatin]   . Minoxidil   . Penicillins     unknwon  . Grapefruit Extract Rash    Patient Measurements: Height: 5\' 6"  (167.6 cm) Weight: 154 lb (69.854 kg) IBW/kg (Calculated) : 59.3 Usual Weight: 83.6kg on 03/22/13  Vital Signs: Temp: 98.4 F (36.9 C) (12/29 0440) Temp Source: Oral (12/29 0440) BP: 141/59 mmHg (12/29 0440) Pulse Rate: 65 (12/29 0440) Intake/Output from previous day: 12/28 0701 - 12/29 0700 In: 330 [NG/GT:30; IV Piggyback:300] Out: 1175 [Urine:650; Emesis/NG output:525] Intake/Output from this shift: Total I/O In: 0  Out: 350 [Urine:100; Drains:250]  Labs:  Recent Labs  03/16/14 0411 03/17/14 0505 03/18/14 0530  WBC 5.8 5.8 5.1  HGB 10.2* 9.7* 9.6*  HCT 30.5* 28.7* 28.9*  PLT 337 324 302     Recent Labs  03/15/14 1114 03/16/14 0411 03/17/14 0505 03/18/14 0530  NA 134* 133* 136 136  K 2.8* 3.7 4.0 3.4*  CL 102 97 103 104  CO2 25 32 31 26  GLUCOSE 132* 95 82 97  BUN 17 15 11 8   CREATININE 0.66 0.73 0.59 0.55  CALCIUM 7.7* 8.5 8.2* 8.2*  MG  --   --   --  1.4*  PROT 6.1 6.3  --  6.1  ALBUMIN 2.8* 2.9*  --  2.7*  AST 23 23  --  25  ALT 12 13  --  14  ALKPHOS 62 63  --  55  BILITOT 0.7 0.4  --  0.6   Estimated Creatinine Clearance: 50.8 mL/min (by C-G formula based on Cr of 0.55).    Recent Labs  03/17/14 1626 03/17/14 2108 03/18/14 0825  GLUCAP 78 59* 124*    Medical History: Past Medical History  Diagnosis Date  . Hypertension   . Hyperlipidemia   . Diabetes mellitus without complication   . Vitamin D deficiency   . Asthma   . DDD (degenerative disc disease)   . Spinal stenosis   . Type II or unspecified type diabetes mellitus without mention of complication, not stated as uncontrolled 03/22/2013    Medications:  Scheduled:  . atenolol  100 mg Oral Daily   . ciprofloxacin  400 mg Intravenous Q12H  . diltiazem  120 mg Oral Q12H  . enalapril  20 mg Oral Daily  . enoxaparin (LOVENOX) injection  40 mg Subcutaneous Q24H  . insulin aspart  0-9 Units Subcutaneous TID WC  . metronidazole  500 mg Intravenous Q8H  . potassium chloride  10 mEq Intravenous Q1 Hr x 3   Infusions:  . dextrose 5 % and 0.9% NaCl 75 mL/hr at 03/17/14 2238     Insulin Requirements:  Minimal sensitive SSI used  Current Nutrition:  NPO  IVF:  D5W with NS @ 75/hr  Central access: PICC placed 12/29 TPN start date: 12/29  ASSESSMENT  HPI:  Kristine Burnett admitted 12/26 with c/o abdominal pain, N/V x 1 day PTA. Pt with recent admit 12/10-12/20 for ruptured appendicitis and appendiceal abscess, pt is s/p RLQ percutaneous drain placed 12/17. Pt was scheduled for outpt follow up with [facility] on 1/6, but presented back to the ED. CT on admission showed resolved RLQ collection, but also shows SBO. Pharmacy is consulted to dose TPN. Noted patient with Hx diabetes  Significant events:   Today:    Glucose - CBGs < 150  Electrolytes - K slightly low at 3.4 (30 mEq repletion ordered by MD), Mag low at 1.4, CCa 9.24, other lytes WNL  Renal - SCr low- stable, CrCl ~50 ml/min  LFTs - WNL  TGs - pending  Prealbumin - 17.4 (12/29)  NUTRITIONAL GOALS                                                            RD recs: 1900-2100 kcal/d, 90-100 gm protein/d  Clinimix / at a goal rate of 50ml/hr + 20% fat emulsion at 47ml/hr to provide: 100g/day protein, 1894Kcal/day.  PLAN                                                                                         At 1800 today:  Start Clinimix E 5/15 at 43ml/hr.  20% fat emulsion at 75ml/hr.  Plan to advance as tolerated to the goal rate. Pt at low risk of refeeding due moderate PO intake up until the end of last week  TPN to  contain standard multivitamins and trace elements.  Magnesium 2g IV bolus x 1  Change IVF to NS @ 29ml/hr.  Change SSI and CBG checks to q4h   TPN lab panels in AM and on Mondays & Thursdays.  F/u daily.  Thank you for the consult.  Currie Paris, PharmD, BCPS Pager: [phone] Pharmacy: [phone] 03/18/2014 2:40 PM

## 2014-03-19 ENCOUNTER — Inpatient Hospital Stay (HOSPITAL_COMMUNITY): Payer: Medicare HMO

## 2014-03-19 DIAGNOSIS — E440 Moderate protein-calorie malnutrition: Secondary | ICD-10-CM | POA: Diagnosis present

## 2014-03-19 DIAGNOSIS — E876 Hypokalemia: Secondary | ICD-10-CM

## 2014-03-19 DIAGNOSIS — I10 Essential (primary) hypertension: Secondary | ICD-10-CM

## 2014-03-19 DIAGNOSIS — K5669 Other intestinal obstruction: Secondary | ICD-10-CM

## 2014-03-19 DIAGNOSIS — E119 Type 2 diabetes mellitus without complications: Secondary | ICD-10-CM

## 2014-03-19 DIAGNOSIS — K651 Peritoneal abscess: Principal | ICD-10-CM

## 2014-03-19 LAB — GLUCOSE, CAPILLARY
GLUCOSE-CAPILLARY: 137 mg/dL — AB (ref 70–99)
Glucose-Capillary: 147 mg/dL — ABNORMAL HIGH (ref 70–99)
Glucose-Capillary: 165 mg/dL — ABNORMAL HIGH (ref 70–99)
Glucose-Capillary: 137 mg/dL — ABNORMAL HIGH (ref 70–99)
Glucose-Capillary: 147 mg/dL — ABNORMAL HIGH (ref 70–99)

## 2014-03-19 LAB — COMPREHENSIVE METABOLIC PANEL
ALBUMIN: 2.8 g/dL — AB (ref 3.5–5.2)
ALK PHOS: 59 U/L (ref 39–117)
ALT: 13 U/L (ref 0–35)
ANION GAP: 8 (ref 5–15)
AST: 28 U/L (ref 0–37)
BILIRUBIN TOTAL: 0.5 mg/dL (ref 0.3–1.2)
BUN: 6 mg/dL (ref 6–23)
CO2: 28 mmol/L (ref 19–32)
CREATININE: 0.52 mg/dL (ref 0.50–1.10)
Calcium: 8.3 mg/dL — ABNORMAL LOW (ref 8.4–10.5)
Chloride: 98 mEq/L (ref 96–112)
GFR calc non Af Amer: 87 mL/min — ABNORMAL LOW (ref >90)
GLUCOSE: 163 mg/dL — AB (ref 70–99)
POTASSIUM: 3.3 mmol/L — AB (ref 3.5–5.1)
Sodium: 134 mmol/L — ABNORMAL LOW (ref 135–145)
Total Protein: 6.3 g/dL (ref 6.0–8.3)

## 2014-03-19 LAB — DIFFERENTIAL
Basophils Absolute: 0 10*3/uL (ref 0.0–0.1)
Basophils Relative: 0 % (ref 0–1)
EOS ABS: 0.2 10*3/uL (ref 0.0–0.7)
Eosinophils Relative: 4 % (ref 0–5)
Lymphocytes Relative: 33 % (ref 12–46)
Lymphs Abs: 1.8 10*3/uL (ref 0.7–4.0)
MONOS PCT: 12 % (ref 3–12)
Monocytes Absolute: 0.7 10*3/uL (ref 0.1–1.0)
NEUTROS ABS: 2.9 10*3/uL (ref 1.7–7.7)
Neutrophils Relative %: 51 % (ref 43–77)

## 2014-03-19 LAB — CBC
HEMATOCRIT: 29.9 % — AB (ref 36.0–46.0)
HEMOGLOBIN: 10.2 g/dL — AB (ref 12.0–15.0)
MCH: 30.9 pg (ref 26.0–34.0)
MCHC: 34.1 g/dL (ref 30.0–36.0)
MCV: 90.6 fL (ref 78.0–100.0)
Platelets: 295 10*3/uL (ref 150–400)
RBC: 3.3 MIL/uL — ABNORMAL LOW (ref 3.87–5.11)
RDW: 15.5 % (ref 11.5–15.5)
WBC: 5.6 10*3/uL (ref 4.0–10.5)

## 2014-03-19 LAB — TRIGLYCERIDES: Triglycerides: 69 mg/dL (ref <150)

## 2014-03-19 LAB — PREALBUMIN: PREALBUMIN: 17.5 mg/dL — AB (ref 17.0–34.0)

## 2014-03-19 LAB — PHOSPHORUS: Phosphorus: 2.5 mg/dL (ref 2.3–4.6)

## 2014-03-19 LAB — MAGNESIUM: Magnesium: 1.7 mg/dL (ref 1.5–2.5)

## 2014-03-19 MED ORDER — MAGNESIUM SULFATE IN D5W 10-5 MG/ML-% IV SOLN
1.0000 g | Freq: Once | INTRAVENOUS | Status: AC
  Administered 2014-03-19: 1 g via INTRAVENOUS
  Filled 2014-03-19: qty 100

## 2014-03-19 MED ORDER — POTASSIUM CHLORIDE IN NACL 40-0.9 MEQ/L-% IV SOLN
INTRAVENOUS | Status: DC
  Administered 2014-03-19 – 2014-03-21 (×3): 50 mL/h via INTRAVENOUS
  Filled 2014-03-19 (×4): qty 1000

## 2014-03-19 MED ORDER — TRACE MINERALS CR-CU-F-FE-I-MN-MO-SE-ZN IV SOLN
INTRAVENOUS | Status: AC
  Administered 2014-03-19: 18:00:00 via INTRAVENOUS
  Filled 2014-03-19: qty 1000

## 2014-03-19 MED ORDER — FAT EMULSION 20 % IV EMUL
240.0000 mL | INTRAVENOUS | Status: AC
  Administered 2014-03-19: 240 mL via INTRAVENOUS
  Filled 2014-03-19: qty 250

## 2014-03-19 MED ORDER — POTASSIUM CHLORIDE 10 MEQ/100ML IV SOLN
10.0000 meq | INTRAVENOUS | Status: AC
  Administered 2014-03-19 (×4): 10 meq via INTRAVENOUS
  Filled 2014-03-19 (×4): qty 100

## 2014-03-19 MED ORDER — INSULIN ASPART 100 UNIT/ML ~~LOC~~ SOLN
0.0000 [IU] | SUBCUTANEOUS | Status: DC
  Administered 2014-03-19 (×3): 2 [IU] via SUBCUTANEOUS
  Administered 2014-03-20: 3 [IU] via SUBCUTANEOUS
  Administered 2014-03-20 – 2014-03-23 (×14): 2 [IU] via SUBCUTANEOUS
  Administered 2014-03-23: 3 [IU] via SUBCUTANEOUS
  Administered 2014-03-23 – 2014-03-24 (×3): 2 [IU] via SUBCUTANEOUS

## 2014-03-19 NOTE — Progress Notes (Addendum)
Kristine Burnett NOTE  Pharmacy Consult for TPN Indication: SBO  Allergies  Allergen Reactions  . Ace Inhibitors Cough  . Crestor [Rosuvastatin]   . Minoxidil   . Penicillins     unknwon  . Grapefruit Extract Rash    Patient Measurements: Height: 5\' 6"  (167.6 cm) Weight: 154 lb (69.854 kg) IBW/kg (Calculated) : 59.3 Usual Weight: 83.6kg on 03/22/13  Vital Signs: Temp: 98.4 F (36.9 C) (12/30 0526) Temp Source: Oral (12/30 0526) BP: 145/58 mmHg (12/30 0526) Pulse Rate: 68 (12/30 0526) Intake/Output from previous day: 12/29 0701 - 12/30 0700 In: 0  Out: 1950 [Urine:1300; Emesis/NG output:400; Drains:250] Intake/Output from this shift:    Labs:  Recent Labs  03/17/14 0505 03/18/14 0530 03/19/14 0530  WBC 5.8 5.1 5.6  HGB 9.7* 9.6* 10.2*  HCT 28.7* 28.9* 29.9*  PLT 324 302 295     Recent Labs  03/17/14 0505 03/18/14 0530 03/19/14 0530  NA 136 136 134*  K 4.0 3.4* 3.3*  CL 103 104 98  CO2 31 26 28   GLUCOSE 82 97 163*  BUN 11 8 6   CREATININE 0.59 0.55 0.52  CALCIUM 8.2* 8.2* 8.3*  MG  --  1.4* 1.7  PHOS  --   --  2.5  PROT  --  6.1 6.3  ALBUMIN  --  2.7* 2.8*  AST  --  25 28  ALT  --  14 13  ALKPHOS  --  55 59  BILITOT  --  0.6 0.5  PREALBUMIN  --  17.4*  --   TRIG  --   --  69   Estimated Creatinine Clearance: 50.8 mL/min (by C-G formula based on Cr of 0.52).    Recent Labs  03/18/14 2354 03/19/14 0406 03/19/14 0828  GLUCAP 168* 147* 165*    Medical History: Past Medical History  Diagnosis Date  . Hypertension   . Hyperlipidemia   . Diabetes mellitus without complication   . Vitamin D deficiency   . Asthma   . DDD (degenerative disc disease)   . Spinal stenosis   . Type II or unspecified type diabetes mellitus without mention of complication, not stated as uncontrolled 03/22/2013    Medications:  Scheduled:  . atenolol  100 mg Oral Daily  . diltiazem  120 mg Oral Q12H  . enalapril  20 mg Oral Daily  . enoxaparin  (LOVENOX) injection  40 mg Subcutaneous Q24H  . insulin aspart  0-9 Units Subcutaneous 6 times per day  . sodium chloride  10-40 mL Intracatheter Q12H   Infusions:  . sodium chloride 50 mL/hr at 03/19/14 0025  . Marland KitchenTPN (CLINIMIX-E) Adult 40 mL/hr at 03/18/14 1706   And  . fat emulsion 240 mL (03/18/14 1707)     Insulin Requirements:  7 units sensitive SSI used since start of TPN  Current Nutrition:  NPO  IVF:  NS @ 50/hr  Central access: PICC placed 12/29 TPN start date: 12/29  ASSESSMENT  HPI:  14 yoF admitted 12/26 with c/o abdominal pain, N/V x 1 day PTA. Pt with recent admit 12/10-12/20 for ruptured appendicitis and appendiceal abscess, pt is s/p RLQ percutaneous drain placed 12/17. Pt was scheduled for outpt follow up with [facility] on 1/6, but presented back to the ED. CT on admission showed resolved RLQ collection, but also shows SBO. Pharmacy is consulted to dose TPN. Noted patient with Hx diabetes  Significant events:   Today:    Glucose - 3/4 CBGs > 150  Electrolytes - K slightly low at 3.3 (decreased despite some repletion), Mag improved to 1.7 (small improvement for repletion given), CCa 9.2, Na slightly low at 134 (cannot adjust in pre-made TPN), other lytes WNL  Renal - SCr low- stable, CrCl ~50 ml/min  LFTs - WNL  TGs - 69 (12/30)  Prealbumin - 17.4 (12/29)  NUTRITIONAL GOALS                                                            RD recs: 1900-2100 kcal/d, 90-100 gm protein/d  Clinimix / at a goal rate of 58ml/hr + 20% fat emulsion at 76ml/hr to provide: 100g/day protein, 1894Kcal/day.  PLAN                                                                                          Give KCl 10 mEq runs x4  Give Mag sulfate 1g IV bolus  Change IVF to NS w KCl 40 mEq @ 50 ml/hr  At 1800 today:  Continue Clinimix E 5/15 at 47ml/hr.  Will wait to  increase rate until electrolytes and glucose under better control  20% fat emulsion at 32ml/hr.  Plan to advance as tolerated to the goal rate. Pt at low risk of refeeding due moderate PO intake up until the end of last week  TPN to contain standard multivitamins and trace elements.  Change IVF as above  Change SSI to moderate scale and continue CBG checks q4h   TPN lab panels in AM and on Mondays & Thursdays.  F/u daily.  Thank you for the consult.  Currie Paris, PharmD, BCPS Pager: [phone] Pharmacy: [phone] 03/19/2014 8:38 AM

## 2014-03-19 NOTE — Progress Notes (Signed)
Subjective: No real change.  Having some pain which is more midabdomen, she says if feels like something coming out of her.  Modest MS use x 2 since last PM.  No flatus or BM.  Objective:  Vital signs in last 24 hours: Temp:  [98.1 F (36.7 C)-98.4 F (36.9 C)] 98.4 F (36.9 C) (12/30 0526) Pulse Rate:  [65-68] 68 (12/30 0526) Resp:  [18] 18 (12/30 0526) BP: (145-152)/(58-78) 145/58 mmHg (12/30 0526) SpO2:  [99 %-100 %] 99 % (12/30 0526) Last BM Date: 03/09/14 400 from NG 1st shift, nothing recorded for 2nd shift Drains 250 recorded. NPO/TNA Afebrile,VSS K+ 3.3 Films this AM:  There remain loops of mildly distended gas-filled small bowel in the<BR>mid abdomen. The volume of gas present is slightly greater than on<BR>the previous study. The esophagogastric tube tip lies in the region<BR>of the gastric fundus. There is residual contrast laden ascending<BR>colon on the right. There is a drainage catheter projecting over the<BR>medial aspect of the right iliac crest.<BR> Intake/Output from previous day: 12/29 0701 - 12/30 0700 In: 0  Out: 1950 [Urine:1300; Emesis/NG output:400; Drains:250] Intake/Output this shift:    General appearance: alert, cooperative and no distress GI: soft, not really tender, rare BS, drain bag is empty, but what is in the line is clear.  Lab Results:   Recent Labs  03/18/14 0530 03/19/14 0530  WBC 5.1 5.6  HGB 9.6* 10.2*  HCT 28.9* 29.9*  PLT 302 295    BMET  Recent Labs  03/18/14 0530 03/19/14 0530  NA 136 134*  K 3.4* 3.3*  CL 104 98  CO2 26 28  GLUCOSE 97 163*  BUN 8 6  CREATININE 0.55 0.52  CALCIUM 8.2* 8.3*   PT/INR No results for input(s): LABPROT, INR in the last 72 hours.   Recent Labs Lab 03/15/14 1114 03/16/14 0411 03/18/14 0530 03/19/14 0530  AST 23 23 25 28   ALT 12 13 14 13   ALKPHOS 62 63 55 59  BILITOT 0.7 0.4 0.6 0.5  PROT 6.1 6.3 6.1 6.3  ALBUMIN 2.8* 2.9* 2.7* 2.8*     Lipase     Component Value  Date/Time   LIPASE 12 03/15/2014 1114     Studies/Results: Ir Sinus/fist Tube Chk-non Gi  03/17/2014   CLINICAL DATA:  History of ruptured appendicitis, post CT-guided percutaneous drainage catheter placement on 03/06/2014. Patient now admitted with small bowel obstruction. Patient reports only 1-2 cc of drainage from the percutaneous drainage catheter. The patient is not flushing the drainage catheter. Please perform a drain injection to evaluate for persistent fistulous connection.  EXAM: SINUS TRACT INJECTION/FISTULOGRAM  COMPARISON:  CT abdomen and pelvis - 03/15/2014; 03/05/2014; 02/26/2014; CT-guided percutaneous drainage catheter placement - 03/06/2014  CONTRAST:  20 cc Omnipaque 300-injected via the existing right lower quadrant percutaneous drainage catheter.  FLUOROSCOPY TIME:  1 min, 18 seconds (17 mGy).  TECHNIQUE: The patient was positioned supine on the fluoroscopy table.  A preprocedural spot radiographic image was obtained of the right lower abdomen and the existing percutaneous drainage catheter. Multiple spot fluoroscopic and radiographic images were obtained following the injection of a small amount of contrast via the existing percutaneous drainage catheter in various obliquities.  The drainage catheter was flushed with a small amount of saline and reconnected to a gravity bag. A dressing was placed. The patient tolerated the procedure well without immediate postprocedural complication.  FINDINGS: Preprocedural spot radiographic image demonstrates grossly unchanged positioning of the percutaneous drainage catheter overlying the right lower abdominal quadrant.  Contrast injection demonstrates a persistent fistulous connection with the end of the percutaneous drainage catheter and the adjacent cecum. This was confirmed with subsequent delayed images and evaluated in various obliquities.  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  PLAN: - Would recommend to continue to NOT flush the percutaneous drainage catheter to encourage fistulous tract healing.  - If the patient is discharged and ultimately not deemed a surgical candidate, the patient may attend the drain clinic at the [facility] on 04/01/2014 as clinically indicated.   Electronically Signed   By: Sandi Mariscal M.D.   On: 03/17/2014 11:01   Dg Abd 2 Views  03/19/2014   CLINICAL DATA:  History small bowel obstruction, persistent abdominal pain and bloating  EXAM: ABDOMEN - 2 VIEW  COMPARISON:  Abdominal series of March 17, 2014  FINDINGS: There remain loops of mildly distended gas-filled small bowel in the mid abdomen. The volume of gas present is slightly greater than on the previous study. The esophagogastric tube tip lies in the region of the gastric fundus. There is residual contrast laden ascending colon on the right. There is a drainage catheter projecting over the medial aspect of the right iliac crest.  IMPRESSION: Persistent mid to distal small bowel obstruction. There has not been dramatic interval change since the previous study.   Electronically Signed   By: David  Martinique   On: 03/19/2014 08:20   Dg Abd 2 Views  03/17/2014   CLINICAL DATA:  Followup partial small bowel obstruction. Patient has an indwelling drainage catheter in the right lower quadrant from a previous abscess, and a persistent fistulous communication with the abscess cavity and cecum was demonstrated earlier today upon catheter injection.  EXAM: ABDOMEN - 2 VIEW  COMPARISON:  Images obtained at the time of abscess a Gram earlier today. Two-view abdomen x-rays yesterday. CT abdomen and pelvis 03/15/2014, 03/06/2014.  FINDINGS: Contrast material in the cecum and ascending colon from the abscessogram earlier today. Multiple dilated loops of small bowel throughout the abdomen, demonstrating air-fluid levels on the erect image, not significantly  changed since yesterday. No evidence of free intraperitoneal air. Surgical clips in right upper quadrant from prior cholecystectomy. Nasogastric tube tip projects over the expected location of the body of the stomach.  IMPRESSION: Stable partial small bowel obstruction since yesterday. No free intraperitoneal air.   Electronically Signed   By: Evangeline Dakin M.D.   On: 03/17/2014 14:35    Medications: . atenolol  100 mg Oral Daily  . diltiazem  120 mg Oral Q12H  . enalapril  20 mg Oral Daily  . enoxaparin (LOVENOX) injection  40 mg Subcutaneous Q24H  . insulin aspart  0-9 Units Subcutaneous 6 times per day  . sodium chloride  10-40 mL Intracatheter Q12H    Assessment/Plan 1. SBO 2. Appendiceal abscess, Perc drain on 03/06/2014 by Dr. Earleen Newport, Abscess appears to have resolved. IR injection on 03/07/14 shows pt's RLQ abscess drain demonstrates persistent communication with the adjacent cecum. As such, the drain was not removed.Gravity bag reconnected. Would recommend to continue NOT to flush drain to encourage fistula healing.  3. Mild wall thickening of entire colon wall - unclear what this means. 4. HTN x 20 years 5. DM - just started meds this past summer 6. Weight loss over the last 6 months 7. DJD with spinal stenosis 8. Anemia - Hgb - 9.1 - 03/15/2014    Plan:  No change in treatment from our standpoint, it looks like Cipro and Flagyl were stopped yesterday, but I'm not sure why.  Will talk with Dr. Excell Seltzer about this.  LOS: 4 days    Obert Espindola 03/19/2014

## 2014-03-19 NOTE — Progress Notes (Signed)
Took patient a bone pillow and had a lengthy visit with her. Offered support. Did assessment. Patient has a very strong [place] faith. 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 Her faith is her primary coping mechanism. She asked for prayer. I prayed and she prayed for me also. Will continue to follow.  Epifania Gore, PhD, [place]

## 2014-03-19 NOTE — Progress Notes (Signed)
Progress Note   Kristine Burnett XBD:[id number] DOB: November 14, 1929 DOA: 03/15/2014 PCP: Alesia Richards, MD   Brief Narrative:   Kristine Burnett is an 80 y.o. female with history of diabetes mellitus, hypertension, hyperlipidemia and recent history of bowel perforation with  RLQ abscess status post percutaneous drainage placement, discharged on 7 days of Cipro/Flagyl, who was admitted 03/15/14 with ongoing a chief complaint of a 24-hour history of nausea and vomiting. Patient did not have abdominal pain at home but started to develop abdominal pain in ED. She was found to have SBO based on CT scan. She was hemodynamically stable in ED. NG tube was placed.   Interventional radiology evaluated  the patient to see if current percutaneous drain can be removed. 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 Per IR, would recommend to continue to NOT flush the percutaneous drainage catheter to encourage fistulous tract healing.  Assessment/Plan:    Principal Problem: Small bowel obstruction  Started supportive care with NG tube placement, IV fluids, antiemetics and analgesia as needed.  Being followed by general surgery.  She is still NPO. TNA started.  Active problems:   Bowel perforation / right lower quadrant abscess / leukocytosis / possible colitis  On recent admission. Pt underwent percutaneous drainage placement and was discharged on a seven-day course of Cipro/Flagyl.  Abcess resolved. 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 Continue percutaneous drainage to gravity.  Malnutrition of moderate degree    TNA started 03/18/14.   Hypokalemia  Secondary to GI losses. Potassium supplemented.   Essential hypertension  Continue home antihypertensive medications: atenolol 100 mg daily, enalapril 20 mg daily and Cardizem 120 mg PO Q 12 hours.  Diabetes mellitus,  controlled with no complications  S3M in 19/6222 6.1 indicating good glycemic control.  Continue to hold metformin and use SSI.  CBG's in past 24 hours: 105-168.  DVT prophylaxis:   SCD's for DVT prophylaxis  Code Status: Full.  Family Communication: Plan of care discussed with the patient and her grandson at the bedside.  Disposition Plan: Home when stable.     IV Access:    PICC placed 03/18/14.   Procedures and diagnostic studies:   Ct Abdomen Pelvis W Contrast 03/15/2014 Percutaneous pigtail drainage catheter over the right lower quadrant unchanged in position as there has been complete resolution of the previously noted right lower quadrant abscess. The previously noted suspected inflamed appendix is now normal in caliber measuring 5-6 mm. New distal small bowel obstructive process with transition point over the right lower quadrant medially below the cecum. Mild associated free fluid in the right mid to lower abdomen. Mild wall thickening and ill definition of the wall of the entire colon with exception of the rectum which may be due to an acute colitis. Other ancillary findings as described unchanged.  Dg Abd 2 Views 03/16/2014 Persistent small bowel obstruction. COPD changes with bibasilar atelectasis.   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 PLAN: Would recommend to continue to NOT flush the percutaneous drainage catheter to encourage fistulous tract healing. If the patient is discharged and ultimately not deemed a surgical candidate, the patient may attend the drain clinic at the [facility] on 04/01/2014 as clinically indicated.  Dg Abd 2 Views 03/17/2014 Stable partial small bowel obstruction since yesterday. No free intraperitoneal air.  Medical Consultants:    Dr. Alphonsa Overall, Surgery  Anti-Infectives:    Cipro  03/15/14--->03/18/14  Flagyl 03/15/14--->03/18/14  Subjective:    [name] continues to have some abdominal pain and bloating. Has not had a BM or passed flatus. Her NG tube remains in place draining bilious material.  Objective:    Filed Vitals:   03/18/14 0440 03/18/14 1456 03/18/14 2156 03/19/14 0526  BP: 141/59 152/78 146/65 145/58  Pulse: 65 65 67 68  Temp: 98.4 F (36.9 C) 98.4 F (36.9 C) 98.1 F (36.7 C) 98.4 F (36.9 C)  TempSrc: Oral Oral Oral Oral  Resp: 18 18 18 18   Height:      Weight: 69.854 kg (154 lb)     SpO2: 98% 99% 100% 99%    Intake/Output Summary (Last 24 hours) at 03/19/14 0748 Last data filed at 03/19/14 0526  Gross per 24 hour  Intake      0 ml  Output   1950 ml  Net  -1950 ml    Exam: Gen:  NAD Cardiovascular:  RRR, No M/R/G Respiratory:  Lungs diminished Gastrointestinal:  Abdomen soft, NT/ND, + BS Extremities:  No C/E/C   Data Reviewed:    Labs: Basic Metabolic Panel:  Recent Labs Lab 03/15/14 1114 03/16/14 0411 03/17/14 0505 03/18/14 0530 03/19/14 0530  NA 134* 133* 136 136 134*  K 2.8* 3.7 4.0 3.4* 3.3*  CL 102 97 103 104 98  CO2 25 32 31 26 28   GLUCOSE 132* 95 82 97 163*  BUN 17 15 11 8 6   CREATININE 0.66 0.73 0.59 0.55 0.52  CALCIUM 7.7* 8.5 8.2* 8.2* 8.3*  MG  --   --   --  1.4* 1.7  PHOS  --   --   --   --  2.5   GFR Estimated Creatinine Clearance: 50.8 mL/min (by C-G formula based on Cr of 0.52). Liver Function Tests:  Recent Labs Lab 03/15/14 1114 03/16/14 0411 03/18/14 0530 03/19/14 0530  AST 23 23 25 28   ALT 12 13 14 13   ALKPHOS 62 63 55 59  BILITOT 0.7 0.4 0.6 0.5  PROT 6.1 6.3 6.1 6.3  ALBUMIN 2.8* 2.9* 2.7* 2.8*    Recent Labs Lab 03/15/14 1114  LIPASE 12   CBC:  Recent Labs Lab 03/12/14 1635 03/15/14 1114 03/16/14 0411 03/17/14 0505 03/18/14 0530 03/19/14 0530  WBC 5.3 5.2 5.8 5.8 5.1 5.6  NEUTROABS 1.7 3.0  --  2.6  --  2.9  HGB 11.8* 9.1* 10.2* 9.7* 9.6* 10.2*  HCT  34.4* 27.2* 30.5* 28.7* 28.9* 29.9*  MCV 87.3 89.5 89.2 90.5 90.6 90.6  PLT 479* 339 337 324 302 295   Microbiology No results found for this or any previous visit (from the past 240 hour(s)).   Medications:   . atenolol  100 mg Oral Daily  . diltiazem  120 mg Oral Q12H  . enalapril  20 mg Oral Daily  . enoxaparin (LOVENOX) injection  40 mg Subcutaneous Q24H  . insulin aspart  0-9 Units Subcutaneous 6 times per day  . sodium chloride  10-40 mL Intracatheter Q12H   Continuous Infusions: . sodium chloride 50 mL/hr at 03/19/14 0025  . Marland KitchenTPN (CLINIMIX-E) Adult 40 mL/hr at 03/18/14 1706   And  . fat emulsion 240 mL (03/18/14 1707)    Time spent: 25 minutes.   LOS: 4 days   [facility] Pager [phone]. If unable to reach me by pager, please call my  cell phone at [phone].  *Please refer to [email or web address], password [email or web address] to get updated schedule on who will round on this patient, as hospitalists switch teams weekly. If 7PM-7AM, please contact night-coverage at [email or web address], password [email or web address] for any overnight needs.  03/19/2014, 7:48 AM

## 2014-03-20 DIAGNOSIS — E440 Moderate protein-calorie malnutrition: Secondary | ICD-10-CM

## 2014-03-20 LAB — GLUCOSE, CAPILLARY
GLUCOSE-CAPILLARY: 130 mg/dL — AB (ref 70–99)
GLUCOSE-CAPILLARY: 139 mg/dL — AB (ref 70–99)
GLUCOSE-CAPILLARY: 151 mg/dL — AB (ref 70–99)
Glucose-Capillary: 146 mg/dL — ABNORMAL HIGH (ref 70–99)
Glucose-Capillary: 121 mg/dL — ABNORMAL HIGH (ref 70–99)
Glucose-Capillary: 124 mg/dL — ABNORMAL HIGH (ref 70–99)

## 2014-03-20 LAB — COMPREHENSIVE METABOLIC PANEL
ALBUMIN: 2.7 g/dL — AB (ref 3.5–5.2)
ALK PHOS: 55 U/L (ref 39–117)
ALT: 12 U/L (ref 0–35)
AST: 24 U/L (ref 0–37)
Anion gap: 4 — ABNORMAL LOW (ref 5–15)
BUN: 9 mg/dL (ref 6–23)
CO2: 29 mmol/L (ref 19–32)
Calcium: 8.3 mg/dL — ABNORMAL LOW (ref 8.4–10.5)
Chloride: 101 mEq/L (ref 96–112)
Creatinine, Ser: 0.47 mg/dL — ABNORMAL LOW (ref 0.50–1.10)
GFR calc non Af Amer: 90 mL/min — ABNORMAL LOW (ref >90)
Glucose, Bld: 140 mg/dL — ABNORMAL HIGH (ref 70–99)
POTASSIUM: 3.7 mmol/L (ref 3.5–5.1)
SODIUM: 134 mmol/L — AB (ref 135–145)
Total Bilirubin: 0.3 mg/dL (ref 0.3–1.2)
Total Protein: 6.1 g/dL (ref 6.0–8.3)

## 2014-03-20 LAB — PHOSPHORUS: Phosphorus: 2.8 mg/dL (ref 2.3–4.6)

## 2014-03-20 LAB — MAGNESIUM: Magnesium: 1.8 mg/dL (ref 1.5–2.5)

## 2014-03-20 MED ORDER — TRACE MINERALS CR-CU-F-FE-I-MN-MO-SE-ZN IV SOLN
INTRAVENOUS | Status: AC
  Administered 2014-03-20: 17:00:00 via INTRAVENOUS
  Filled 2014-03-20: qty 2000

## 2014-03-20 MED ORDER — TRACE MINERALS CR-CU-F-FE-I-MN-MO-SE-ZN IV SOLN
INTRAVENOUS | Status: DC
  Filled 2014-03-20: qty 1000

## 2014-03-20 MED ORDER — FAT EMULSION 20 % IV EMUL
240.0000 mL | INTRAVENOUS | Status: AC
  Administered 2014-03-20: 240 mL via INTRAVENOUS
  Filled 2014-03-20: qty 250

## 2014-03-20 MED ORDER — FAT EMULSION 20 % IV EMUL
240.0000 mL | INTRAVENOUS | Status: DC
  Filled 2014-03-20: qty 250

## 2014-03-20 NOTE — Progress Notes (Signed)
[facility] NOTE  Pharmacy Consult for TPN Indication: SBO  Allergies  Allergen Reactions  . Ace Inhibitors Cough  . Crestor [Rosuvastatin]   . Minoxidil   . Penicillins     unknwon  . Grapefruit Extract Rash   Patient Measurements: Height: 5\' 6"  (167.6 cm) Weight: 153 lb 7 oz (69.6 kg) IBW/kg (Calculated) : 59.3 Usual Weight: 83.6kg on 03/22/13  Vital Signs: Temp: 98.8 F (37.1 C) (12/31 0503) Temp Source: Oral (12/31 0503) BP: 135/65 mmHg (12/31 0503) Pulse Rate: 73 (12/31 0503) Intake/Output from previous day: 12/30 0701 - 12/31 0700 In: 0  Out: 1575 [Urine:575; Emesis/NG output:1000] Intake/Output from this shift:    Labs:  Recent Labs  03/18/14 0530 03/19/14 0530  WBC 5.1 5.6  HGB 9.6* 10.2*  HCT 28.9* 29.9*  PLT 302 295    Recent Labs  03/18/14 0530 03/19/14 0530 03/20/14 0530  NA 136 134* 134*  K 3.4* 3.3* 3.7  CL 104 98 101  CO2 26 28 29   GLUCOSE 97 163* 140*  BUN 8 6 9   CREATININE 0.55 0.52 0.47*  CALCIUM 8.2* 8.3* 8.3*  MG 1.4* 1.7 1.8  PHOS  --  2.5 2.8  PROT 6.1 6.3 6.1  ALBUMIN 2.7* 2.8* 2.7*  AST 25 28 24   ALT 14 13 12   ALKPHOS 55 59 55  BILITOT 0.6 0.5 0.3  PREALBUMIN 17.4* 17.5*  --   TRIG  --  69  --    Estimated Creatinine Clearance: 50.8 mL/min (by C-G formula based on Cr of 0.47).    Recent Labs  03/20/14 0015 03/20/14 0434 03/20/14 0751  GLUCAP 151* 139* 146*   Medical History: Past Medical History  Diagnosis Date  . Hypertension   . Hyperlipidemia   . Diabetes mellitus without complication   . Vitamin D deficiency   . Asthma   . DDD (degenerative disc disease)   . Spinal stenosis   . Type II or unspecified type diabetes mellitus without mention of complication, not stated as uncontrolled 03/22/2013   Medications:  Scheduled:  . atenolol  100 mg Oral Daily  . diltiazem  120 mg Oral Q12H  . enalapril  20 mg Oral Daily  . enoxaparin (LOVENOX) injection  40 mg Subcutaneous Q24H  . insulin  aspart  0-15 Units Subcutaneous 6 times per day  . sodium chloride  10-40 mL Intracatheter Q12H   Infusions:  . 0.9 % NaCl with KCl 40 mEq / L 50 mL/hr (03/19/14 1034)  . Marland KitchenTPN (CLINIMIX-E) Adult 40 mL/hr at 03/19/14 1746   And  . fat emulsion 240 mL (03/19/14 1745)   Insulin Requirements:  13 units moderate Novolog correction scale  Current Nutrition:  NPO  IVF:  NS + 40 mEq KCl @ 50/hr  Central access: PICC placed 12/29 TPN start date: 12/29  ASSESSMENT                                                                                                          HPI:  31 yoF admitted 12/26 with c/o  abdominal pain, N/V x 1 day PTA. Pt with recent admit 12/10-12/20 for ruptured appendicitis and appendiceal abscess, pt is s/p RLQ percutaneous drain placed 12/17. Pt was scheduled for outpt follow up with [facility] on 1/6, but presented back to the ED. CT on admission showed resolved RLQ collection, but also shows SBO. Pharmacy is consulted to dose TPN. Noted patient with Hx diabetes  Significant events:   Today:    Glucose - 2/6 CBGs > 150  Electrolytes - K + 3.7, repleted IV 12/29 & 12/30, Mag improved to 1.8, CCa 8.8, Na slightly low at 134 (cannot adjust in pre-made TPN), other lytes WNL  Renal - SCr low- stable, CrCl ~50 ml/min  LFTs - WNL  TGs - 69 (12/30)  Prealbumin - 17.4 (12/29)  NG output ~ 1L/24 hr  NUTRITIONAL GOALS                                                            RD recs: 1900-2100 kcal/d, 90-100 gm protein/d  Clinimix / at a goal rate of 30ml/hr + 20% fat emulsion at 13ml/hr to provide: 100g/day protein, 1894Kcal/day.  PLAN                                                                                          Advance TNA with 6pm bag today  Continue IVF of NS w KCl 40 mEq @ 50 ml/hr  At 1800 today:  Increase Clinimix E 5/15 to 47ml/hr.  20% fat emulsion at 76ml/hr.  Plan to advance as tolerated to the goal rate. Pt at low risk of refeeding due  moderate PO intake up until the end of last week  TPN to contain standard multivitamins and trace elements.  Continue SSI to moderate scale and continue CBG checks q4h   TPN lab panels in AM and on Mondays & Thursdays.  BMET, Mag, Phos levels in am  F/u daily.  Thank you for the consult.  Minda Ditto PharmD Pager [phone] 03/20/2014, 10:24 AM

## 2014-03-20 NOTE — Progress Notes (Signed)
Patient ambulated again in hall way, did 2 laps, well tolerated.

## 2014-03-20 NOTE — Progress Notes (Signed)
Third time visiting patient to engage for services. She states her daughter that Probation officer needs to speak with is in [place]. Explained services to another daughter at bedside nonetheless. No interest expressed. Second packet for [facility] has been provided. Patient and family have contact information if they become interested in the future.  Marthenia Rolling, MSN- [facility] [phone]

## 2014-03-20 NOTE — Progress Notes (Signed)
Progress Note   Kristine Burnett QBH:[id number] DOB: 07/25/1929 DOA: 03/15/2014 PCP: Alesia Richards, MD   Brief Narrative:   Kristine Burnett is an 80 y.o. female with history of diabetes mellitus, hypertension, hyperlipidemia and recent history of bowel perforation with  RLQ abscess status post percutaneous drainage placement, discharged on 7 days of Cipro/Flagyl, who was admitted 03/15/14 with ongoing a chief complaint of a 24-hour history of nausea and vomiting. Patient did not have abdominal pain at home but started to develop abdominal pain in ED. She was found to have SBO based on CT scan. She was hemodynamically stable in ED. NG tube was placed.   Interventional radiology evaluated  the patient to see if current percutaneous drain can be removed. 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 Per IR, would recommend to continue to NOT flush the percutaneous drainage catheter to encourage fistulous tract healing.  Assessment/Plan:    Principal Problem: Small bowel obstruction  Continue supportive care with NG tube placement, IV fluids, antiemetics and analgesia as needed.  Ambulating multiple times a day in hallway.  Being followed by general surgery.  No change in radiographic appearance on films done 03/19/14.  She is still NPO with NG tube in place for gastric decompression.   Continue TNA.  Active problems:   Bowel perforation / right lower quadrant abscess / leukocytosis / possible colitis  On recent admission. Pt underwent percutaneous drainage placement and was discharged on a seven-day course of Cipro/Flagyl.  Abcess resolved. 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 Continue percutaneous drainage to gravity.  Malnutrition of moderate degree    TNA started 03/18/14.   Hypokalemia  Secondary to GI losses. Potassium supplemented.    Essential hypertension  Continue home antihypertensive medications: atenolol 100 mg daily, enalapril 20 mg daily and Cardizem 120 mg PO Q 12 hours.  Diabetes mellitus, controlled with no complications  O9B in 35/3299 6.1 indicating good glycemic control.  Continue to hold metformin and use SSI.  CBG's in past 24 hours: 137-151.  DVT prophylaxis:   SCD's for DVT prophylaxis  Code Status: Full.  Family Communication: Plan of care discussed with the patient and her grandson at the bedside 03/19/14.  No family at bedside today.  Disposition Plan: Home when stable.     IV Access:    PICC placed 03/18/14.   Procedures and diagnostic studies:   Ct Abdomen Pelvis W Contrast 03/15/2014 Percutaneous pigtail drainage catheter over the right lower quadrant unchanged in position as there has been complete resolution of the previously noted right lower quadrant abscess. The previously noted suspected inflamed appendix is now normal in caliber measuring 5-6 mm. New distal small bowel obstructive process with transition point over the right lower quadrant medially below the cecum. Mild associated free fluid in the right mid to lower abdomen. Mild wall thickening and ill definition of the wall of the entire colon with exception of the rectum which may be due to an acute colitis. Other ancillary findings as described unchanged.  Dg Abd 2 Views 03/16/2014 Persistent small bowel obstruction. COPD changes with bibasilar atelectasis.   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 PLAN: Would recommend to continue to NOT flush the percutaneous drainage catheter to encourage fistulous tract healing. If the patient is discharged and ultimately not deemed a surgical candidate, the patient  may attend the drain clinic at the [facility] on 04/01/2014 as clinically  indicated.  Dg Abd 2 Views 03/17/2014 Stable partial small bowel obstruction since yesterday. No free intraperitoneal air.  Dg Abd 2 Views 03/19/2014: Persistent mid to distal small bowel obstruction. There has not been dramatic interval change since the previous study.      Medical Consultants:    Dr. Alphonsa Overall, Surgery  Anti-Infectives:    Cipro 03/15/14--->03/18/14  Flagyl 03/15/14--->03/18/14  Subjective:   Kristine Burnett continues to have occasional abdominal pain and bloating. Still has not had a BM or passed flatus. Her NG tube remains in place draining bilious material.  Tolerates clamping and short intervals to ambulate.  Objective:    Filed Vitals:   03/19/14 0526 03/19/14 1335 03/19/14 2110 03/20/14 0503  BP: 145/58 156/72 148/66 135/65  Pulse: 68 75 81 73  Temp: 98.4 F (36.9 C) 98.3 F (36.8 C) 98.8 F (37.1 C) 98.8 F (37.1 C)  TempSrc: Oral Oral Oral Oral  Resp: 18 16 16 16   Height:      Weight:    69.6 kg (153 lb 7 oz)  SpO2: 99% 100% 100% 99%    Intake/Output Summary (Last 24 hours) at 03/20/14 0804 Last data filed at 03/20/14 0503  Gross per 24 hour  Intake      0 ml  Output    575 ml  Net   -575 ml    Exam: Gen:  NAD Cardiovascular:  RRR, No M/R/G Respiratory:  Lungs diminished Gastrointestinal:  Abdomen soft, NT/ND, + BS Extremities:  No C/E/C   Data Reviewed:   Basic Metabolic Panel:  Recent Labs Lab 03/16/14 0411 03/17/14 0505 03/18/14 0530 03/19/14 0530 03/20/14 0530  NA 133* 136 136 134* 134*  K 3.7 4.0 3.4* 3.3* 3.7  CL 97 103 104 98 101  CO2 32 31 26 28 29   GLUCOSE 95 82 97 163* 140*  BUN 15 11 8 6 9   CREATININE 0.73 0.59 0.55 0.52 0.47*  CALCIUM 8.5 8.2* 8.2* 8.3* 8.3*  MG  --   --  1.4* 1.7 1.8  PHOS  --   --   --  2.5 2.8   GFR Estimated Creatinine Clearance: 50.8 mL/min (by C-G formula based on Cr of 0.47). Liver Function Tests:  Recent Labs Lab 03/15/14 1114 03/16/14 0411 03/18/14 0530  03/19/14 0530 03/20/14 0530  AST 23 23 25 28 24   ALT 12 13 14 13 12   ALKPHOS 62 63 55 59 55  BILITOT 0.7 0.4 0.6 0.5 0.3  PROT 6.1 6.3 6.1 6.3 6.1  ALBUMIN 2.8* 2.9* 2.7* 2.8* 2.7*    Recent Labs Lab 03/15/14 1114  LIPASE 12   No results for input(s): AMMONIA in the last 168 hours. Coagulation profile No results for input(s): INR, PROTIME in the last 168 hours.  CBC:  Recent Labs Lab 03/15/14 1114 03/16/14 0411 03/17/14 0505 03/18/14 0530 03/19/14 0530  WBC 5.2 5.8 5.8 5.1 5.6  NEUTROABS 3.0  --  2.6  --  2.9  HGB 9.1* 10.2* 9.7* 9.6* 10.2*  HCT 27.2* 30.5* 28.7* 28.9* 29.9*  MCV 89.5 89.2 90.5 90.6 90.6  PLT 339 337 324 302 295   CBG:  Recent Labs Lab 03/19/14 1608 03/19/14 2014 03/20/14 0015 03/20/14 0434 03/20/14 0751  GLUCAP 147* 137* 151* 139* 146*   Lipid Profile  Recent Labs  03/19/14 0530  TRIG 69   Microbiology No results found for this or  any previous visit (from the past 240 hour(s)).   Medications:   . atenolol  100 mg Oral Daily  . diltiazem  120 mg Oral Q12H  . enalapril  20 mg Oral Daily  . enoxaparin (LOVENOX) injection  40 mg Subcutaneous Q24H  . insulin aspart  0-15 Units Subcutaneous 6 times per day  . sodium chloride  10-40 mL Intracatheter Q12H   Continuous Infusions: . 0.9 % NaCl with KCl 40 mEq / L 50 mL/hr (03/19/14 1034)  . Marland KitchenTPN (CLINIMIX-E) Adult 40 mL/hr at 03/19/14 1746   And  . fat emulsion 240 mL (03/19/14 1745)    Time spent: 25 minutes.   LOS: 5 days   [facility] Pager [phone]. If unable to reach me by pager, please call my cell phone at [phone].  *Please refer to [email or web address], password [email or web address] to get updated schedule on who will round on this patient, as hospitalists switch teams weekly. If 7PM-7AM, please contact night-coverage at [email or web address], password [email or web address] for any overnight needs.  03/20/2014, 8:04 AM

## 2014-03-20 NOTE — Progress Notes (Signed)
Patient ambulated in hallway for 2 laps, well tolerated.

## 2014-03-20 NOTE — Progress Notes (Signed)
Subjective: She had some gas pain this Am, but she isn't having flatus and no peritonits at this point.    Objective: Vital signs in last 24 hours: Temp:  [98.3 F (36.8 C)-98.8 F (37.1 C)] 98.8 F (37.1 C) (12/31 0503) Pulse Rate:  [73-81] 73 (12/31 0503) Resp:  [16] 16 (12/31 0503) BP: (135-156)/(65-72) 135/65 mmHg (12/31 0503) SpO2:  [99 %-100 %] 99 % (12/31 0503) Weight:  [69.6 kg (153 lb 7 oz)] 69.6 kg (153 lb 7 oz) (12/31 0503) Last BM Date: 03/09/14 Nothing recorded on I/O - nurse reports about a liter removed last PM or early this AM.   Afebrile, VSS BMP OK,  Intake/Output from previous day: April 05, 2023 0701 - 12/31 0700 In: 0  Out: 575 [Urine:575] Intake/Output this shift:    General appearance: alert, cooperative and no distress GI: soft, non-tender; bowel sounds normal; no masses,  no organomegaly and exam is still benign.  Lab Results:   Recent Labs  03/18/14 0530 04/04/14 0530  WBC 5.1 5.6  HGB 9.6* 10.2*  HCT 28.9* 29.9*  PLT 302 295    BMET  Recent Labs  04-Apr-2014 0530 03/20/14 0530  NA 134* 134*  K 3.3* 3.7  CL 98 101  CO2 28 29  GLUCOSE 163* 140*  BUN 6 9  CREATININE 0.52 0.47*  CALCIUM 8.3* 8.3*   PT/INR No results for input(s): LABPROT, INR in the last 72 hours.   Recent Labs Lab 03/15/14 1114 03/16/14 0411 03/18/14 0530 04/04/14 0530 03/20/14 0530  AST 23 23 25 28 24   ALT 12 13 14 13 12   ALKPHOS 62 63 55 59 55  BILITOT 0.7 0.4 0.6 0.5 0.3  PROT 6.1 6.3 6.1 6.3 6.1  ALBUMIN 2.8* 2.9* 2.7* 2.8* 2.7*     Lipase     Component Value Date/Time   LIPASE 12 03/15/2014 1114     Studies/Results: Dg Abd 2 Views  04/04/14   CLINICAL DATA:  History small bowel obstruction, persistent abdominal pain and bloating  EXAM: ABDOMEN - 2 VIEW  COMPARISON:  Abdominal series of March 17, 2014  FINDINGS: There remain loops of mildly distended gas-filled small bowel in the mid abdomen. The volume of gas present is slightly greater than  on the previous study. The esophagogastric tube tip lies in the region of the gastric fundus. There is residual contrast laden ascending colon on the right. There is a drainage catheter projecting over the medial aspect of the right iliac crest.  IMPRESSION: Persistent mid to distal small bowel obstruction. There has not been dramatic interval change since the previous study.   Electronically Signed   By: David  Martinique   On: April 04, 2014 08:20    Medications: . atenolol  100 mg Oral Daily  . diltiazem  120 mg Oral Q12H  . enalapril  20 mg Oral Daily  . enoxaparin (LOVENOX) injection  40 mg Subcutaneous Q24H  . insulin aspart  0-15 Units Subcutaneous 6 times per day  . sodium chloride  10-40 mL Intracatheter Q12H    Assessment/Plan 1. SBO 2. Appendiceal abscess, Perc drain on 03/06/2014 by Dr. Earleen Newport, Abscess appears to have resolved. IR injection on 03/07/14 shows pt's RLQ abscess drain demonstrates persistent communication with the adjacent cecum. As such, the drain was not removed.Gravity bag reconnected. Would recommend to continue NOT to flush drain to encourage fistula healing.  3. Mild wall thickening of entire colon wall - unclear what this means. 4. HTN x 20 years 5. DM -  just started meds this past summer 6. Weight loss over the last 6 months 7. DJD with spinal stenosis 8. Anemia - Hgb - 9.1 - 03/15/2014   Plan:  Continue TNA, she needs to keep walking.  Staffing is the biggest issue here.  No real improvement.  I will recheck labs and film in AM.    LOS: 5 days    Kristine Burnett 03/20/2014

## 2014-03-20 NOTE — Care Management Note (Signed)
CARE MANAGEMENT NOTE 03/20/2014  Patient:  Kristine Burnett, Kristine Burnett   Account Number:  [id number]  Date Initiated:  03/20/2014  Documentation initiated by:  Marney Doctor  Subjective/Objective Assessment:   80 yo admitted with SBO     Action/Plan:   From home alone and is active with [facility] for [facility] and PT   Anticipated DC Date:  03/24/2014   Anticipated DC Plan:  [place]  CM consult      Choice offered to / List presented to:          [facility] arranged  HH-1 RN  [place].   Status of service:  In process, will continue to follow Medicare Important Message given?   (If response is "NO", the following Medicare IM given date fields will be blank) Date Medicare IM given:   Medicare IM given by:   Date Additional Medicare IM given:   Additional Medicare IM given by:    Discharge Disposition:    Per UR Regulation:    If discussed at Long Length of Stay Meetings, dates discussed:    Comments:  03/20/14 Marney Doctor RN,BSN,NCM [phone] Chart reviewed.  Pt admitted from home alone and has [facility] for HHPT/RN.  Will need resumption orders and face to face at DC.  Cm will continue to follow and assist with needs.

## 2014-03-20 NOTE — Progress Notes (Signed)
Ambulated with RN in hallway, did 2 laps, room to beach scene, then palliative section.Tolerated well.

## 2014-03-21 ENCOUNTER — Inpatient Hospital Stay (HOSPITAL_COMMUNITY): Payer: Medicare HMO

## 2014-03-21 LAB — CBC
HEMATOCRIT: 28.3 % — AB (ref 36.0–46.0)
Hemoglobin: 9.4 g/dL — ABNORMAL LOW (ref 12.0–15.0)
MCH: 30.3 pg (ref 26.0–34.0)
MCHC: 33.2 g/dL (ref 30.0–36.0)
MCV: 91.3 fL (ref 78.0–100.0)
Platelets: 215 10*3/uL (ref 150–400)
RBC: 3.1 MIL/uL — ABNORMAL LOW (ref 3.87–5.11)
RDW: 16.1 % — AB (ref 11.5–15.5)
WBC: 4.7 10*3/uL (ref 4.0–10.5)

## 2014-03-21 LAB — BASIC METABOLIC PANEL
Anion gap: 5 (ref 5–15)
BUN: 14 mg/dL (ref 6–23)
CO2: 27 mmol/L (ref 19–32)
Calcium: 8.2 mg/dL — ABNORMAL LOW (ref 8.4–10.5)
Chloride: 106 mEq/L (ref 96–112)
Creatinine, Ser: 0.47 mg/dL — ABNORMAL LOW (ref 0.50–1.10)
GFR calc non Af Amer: 90 mL/min — ABNORMAL LOW (ref >90)
Glucose, Bld: 145 mg/dL — ABNORMAL HIGH (ref 70–99)
Potassium: 3.9 mmol/L (ref 3.5–5.1)
Sodium: 138 mmol/L (ref 135–145)

## 2014-03-21 LAB — GLUCOSE, CAPILLARY
GLUCOSE-CAPILLARY: 116 mg/dL — AB (ref 70–99)
GLUCOSE-CAPILLARY: 147 mg/dL — AB (ref 70–99)
GLUCOSE-CAPILLARY: 97 mg/dL (ref 70–99)
Glucose-Capillary: 125 mg/dL — ABNORMAL HIGH (ref 70–99)
Glucose-Capillary: 133 mg/dL — ABNORMAL HIGH (ref 70–99)
Glucose-Capillary: 143 mg/dL — ABNORMAL HIGH (ref 70–99)

## 2014-03-21 LAB — PHOSPHORUS: PHOSPHORUS: 3.5 mg/dL (ref 2.3–4.6)

## 2014-03-21 LAB — MAGNESIUM: MAGNESIUM: 1.6 mg/dL (ref 1.5–2.5)

## 2014-03-21 LAB — ABO/RH: ABO/RH(D): O POS

## 2014-03-21 LAB — PROTIME-INR
INR: 1.2 (ref 0.00–1.49)
Prothrombin Time: 15.3 seconds — ABNORMAL HIGH (ref 11.6–15.2)

## 2014-03-21 LAB — APTT: APTT: 32 s (ref 24–37)

## 2014-03-21 LAB — TYPE AND SCREEN
ABO/RH(D): O POS
ANTIBODY SCREEN: NEGATIVE

## 2014-03-21 MED ORDER — TRACE MINERALS CR-CU-F-FE-I-MN-MO-SE-ZN IV SOLN
INTRAVENOUS | Status: AC
  Administered 2014-03-21: 17:00:00 via INTRAVENOUS
  Filled 2014-03-21: qty 2000

## 2014-03-21 MED ORDER — FAT EMULSION 20 % IV EMUL
240.0000 mL | INTRAVENOUS | Status: AC
  Administered 2014-03-21: 240 mL via INTRAVENOUS
  Filled 2014-03-21: qty 250

## 2014-03-21 NOTE — Progress Notes (Signed)
Patient ID: Kristine Burnett, female   DOB: 1929/06/22, 81 y.o.   MRN: [id number]  General Surgery - [facility] - Progress Note  Subjective: Patient happy this AM - passing gas yesterday evening, three small formed BM's overnight per patient.  No pain.  No nausea.  Objective: Vital signs in last 24 hours: Temp:  [98.2 F (36.8 C)-98.8 F (37.1 C)] 98.6 F (37 C) (01/01 0533) Pulse Rate:  [77-79] 77 (01/01 0533) Resp:  [16-18] 18 (01/01 0533) BP: (118-138)/(58-66) 133/58 mmHg (01/01 0533) SpO2:  [100 %] 100 % (01/01 0533) Weight:  [148 lb 2.4 oz (67.2 kg)] 148 lb 2.4 oz (67.2 kg) (01/01 0700) Last BM Date: 03/09/14  Intake/Output from previous day: 12/31 0701 - 01/01 0700 In: 2594 [P.O.:100; I.V.:1137.5; TPN:1356.5] Out: 2150 [Urine:1250; Emesis/NG output:900]  Exam: HEENT - clear, not icteric Neck - soft Chest - clear bilaterally Cor - RRR, no murmur Abd - soft without distension; BS present; small bilious in NG tube; no output from perc drain; non-tender to palpation Ext - no significant edema Neuro - grossly intact, no focal deficits  Lab Results:   Recent Labs  2014-04-14 0530 03/21/14 0545  WBC 5.6 4.7  HGB 10.2* 9.4*  HCT 29.9* 28.3*  PLT 295 215     Recent Labs  03/20/14 0530 03/21/14 0545  NA 134* 138  K 3.7 3.9  CL 101 106  CO2 29 27  GLUCOSE 140* 145*  BUN 9 14  CREATININE 0.47* 0.47*  CALCIUM 8.3* 8.2*    Studies/Results: Dg Abd 2 Views  04-14-14   CLINICAL DATA:  History small bowel obstruction, persistent abdominal pain and bloating  EXAM: ABDOMEN - 2 VIEW  COMPARISON:  Abdominal series of March 17, 2014  FINDINGS: There remain loops of mildly distended gas-filled small bowel in the mid abdomen. The volume of gas present is slightly greater than on the previous study. The esophagogastric tube tip lies in the region of the gastric fundus. There is residual contrast laden ascending colon on the right. There is a drainage  catheter projecting over the medial aspect of the right iliac crest.  IMPRESSION: Persistent mid to distal small bowel obstruction. There has not been dramatic interval change since the previous study.   Electronically Signed   By: David  Martinique   On: 04-14-14 08:20    Assessment / Plan: 1.  Small bowel obstruction  Clinical signs of improvement - await AXR this AM  If AXR improved, will remove NG tube today 2.  Perforated appendicitis  Drain remains in place, no irrigation due to fistulous connection  Abscess resolved - off abx's  Earnstine Regal, MD, [facility] Office: [phone]  03/21/2014

## 2014-03-21 NOTE — Progress Notes (Signed)
Pt has walked a total of 4 laps so far today, one walk with NT and one walk with RN.  Pt ambulating in room to BR.  Pt's NGT removed earlier and tolerated clear liq breakfast without any N/V.  Would like to order lunch tray later.

## 2014-03-21 NOTE — Progress Notes (Signed)
[place] NOTE  Pharmacy Consult for TPN Indication: SBO  Allergies  Allergen Reactions  . Ace Inhibitors Cough  . Crestor [Rosuvastatin]   . Minoxidil   . Penicillins     unknwon  . Grapefruit Extract Rash   Patient Measurements: Height: 5\' 6"  (167.6 cm) Weight: 148 lb 2.4 oz (67.2 kg) IBW/kg (Calculated) : 59.3 Usual Weight: 83.6kg on 03/22/13  Vital Signs: Temp: 98.6 F (37 C) (01/01 0533) Temp Source: Oral (01/01 0533) BP: 133/58 mmHg (01/01 0533) Pulse Rate: 77 (01/01 0533) Intake/Output from previous day: 12/31 0701 - 01/01 0700 In: 2594 [P.O.:100; I.V.:1137.5; TPN:1356.5] Out: 2150 [Urine:1250; Emesis/NG output:900] Intake/Output from this shift:    Labs:  Recent Labs  03/19/14 0530 03/21/14 0545  WBC 5.6 4.7  HGB 10.2* 9.4*  HCT 29.9* 28.3*  PLT 295 215  APTT  --  32  INR  --  1.20    Recent Labs  03/19/14 0530 03/20/14 0530 03/21/14 0545  NA 134* 134* 138  K 3.3* 3.7 3.9  CL 98 101 106  CO2 28 29 27   GLUCOSE 163* 140* 145*  BUN 6 9 14   CREATININE 0.52 0.47* 0.47*  CALCIUM 8.3* 8.3* 8.2*  MG 1.7 1.8 1.6  PHOS 2.5 2.8 3.5  PROT 6.3 6.1  --   ALBUMIN 2.8* 2.7*  --   AST 28 24  --   ALT 13 12  --   ALKPHOS 59 55  --   BILITOT 0.5 0.3  --   PREALBUMIN 17.5*  --   --   TRIG 69  --   --    Estimated Creatinine Clearance: 50.8 mL/min (by C-G formula based on Cr of 0.47).    Recent Labs  03/20/14 2018 03/21/14 0004 03/21/14 0429  GLUCAP 124* 133* 116*   Medical History: Past Medical History  Diagnosis Date  . Hypertension   . Hyperlipidemia   . Diabetes mellitus without complication   . Vitamin D deficiency   . Asthma   . DDD (degenerative disc disease)   . Spinal stenosis   . Type II or unspecified type diabetes mellitus without mention of complication, not stated as uncontrolled 03/22/2013   Medications:  Scheduled:  . atenolol  100 mg Oral Daily  . diltiazem  120 mg Oral Q12H  . enalapril  20 mg Oral  Daily  . enoxaparin (LOVENOX) injection  40 mg Subcutaneous Q24H  . insulin aspart  0-15 Units Subcutaneous 6 times per day  . sodium chloride  10-40 mL Intracatheter Q12H   Infusions:  . 0.9 % NaCl with KCl 40 mEq / L 50 mL/hr (03/20/14 1148)  . Marland KitchenTPN (CLINIMIX-E) Adult 55 mL/hr at 03/20/14 1726   And  . fat emulsion 240 mL (03/20/14 1725)   Insulin Requirements:  12 units moderate Novolog correction scale  Current Nutrition:  NPO Clinimix E 5/15 at 55 ml/hr Lipids 20% at 10 ml/hr  IVF:  NS + 40 mEq KCl @ 50/hr  Central access: PICC placed 12/29 TPN start date: 12/29  ASSESSMENT  HPI:  63 yoF admitted 12/26 with c/o abdominal pain, N/V x 1 day PTA. Pt with recent admit 12/10-12/20 for ruptured appendicitis and appendiceal abscess, pt is s/p RLQ percutaneous drain placed 12/17. Pt was scheduled for outpt follow up with [facility] on 1/6, but presented back to the ED. CT on admission showed resolved RLQ collection, but also shows SBO. Pharmacy is consulted to dose TPN. Noted patient with Hx diabetes  Significant events:  12/31: Not passing gas yet. Possible laparotomy in next few days if no improvement  Today 03/21/2014: Passing gas and having BMs, clinically improving, abdominal XR pending, possible NG tube removal today.   Glucose - at goal < 150 with moderate amounts of insulin  Electrolytes - K now WNL after repletion, all others WNL.  Renal - SCr low- stable, CrCl ~50 ml/min  LFTs - WNL  TGs - 69 (12/30)  Prealbumin - 17.4 (12/29)  NG output ~ 1.9L/24 hr  NUTRITIONAL GOALS                                                            RD recs: 1900-2100 kcal/d, 90-100 gm protein/d  Clinimix / at a goal rate of 43ml/hr + 20% fat emulsion at 21ml/hr to provide: 100g/day protein, 1894Kcal/day.  PLAN                                                                                           At 1800 today:  Increase Clinimix E 5/15 to 31ml/hr.  20% fat emulsion at 56ml/hr.  Decrease MIVF to 30 ml/hr  Plan to advance as tolerated to the goal rate.  TPN to contain standard multivitamins and trace elements.  Continue SSI to moderate scale and continue CBG checks q4h   TPN lab panels in AM and on Mondays & Thursdays.  BMET in am  F/u daily.  Peggyann Juba, PharmD, BCPS Pager: [phone]  03/21/2014, 7:46 AM

## 2014-03-21 NOTE — Progress Notes (Signed)
Progress Note   Kristine Burnett:[id number] DOB: [birth year] DOA: 03/15/2014 PCP: Alesia Richards, MD   Brief Narrative:   Kristine Burnett is an 81 y.o. female with history of diabetes mellitus, hypertension, hyperlipidemia and recent history of bowel perforation with  RLQ abscess status post percutaneous drainage placement, discharged on 7 days of Cipro/Flagyl, who was admitted 03/15/14 with ongoing a chief complaint of a 24-hour history of nausea and vomiting. Patient did not have abdominal pain at home but started to develop abdominal pain in ED. She was found to have SBO based on CT scan. She was hemodynamically stable in ED. NG tube was placed.   Interventional radiology evaluated  the patient to see if current percutaneous drain can be removed. 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 Per IR, would recommend to continue to NOT flush the percutaneous drainage catheter to encourage fistulous tract healing.  Assessment/Plan:    Principal Problem: Small bowel obstruction  Treated with supportive care with NG tube placement, IV fluids, antiemetics and analgesia as needed.  Ambulating multiple times a day in hallway.  Being followed by general surgery.  No change in radiographic appearance on films done 03/19/14, but films today show a normal bowel gas pattern.  Advance diet to CL and D/C NG tube.  Continue TNA.  Wean 03/22/13 if diet tolerated.  Active problems:   Bowel perforation / right lower quadrant abscess / leukocytosis / possible colitis  On recent admission. Pt underwent percutaneous drainage placement and was discharged on a seven-day course of Cipro/Flagyl.  Abcess resolved. 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 Continue percutaneous drainage to gravity.  Malnutrition of moderate degree    TNA started 03/18/14.    Hypokalemia  Secondary to GI losses. Potassium supplemented.   Essential hypertension  Continue home antihypertensive medications: atenolol 100 mg daily, enalapril 20 mg daily and Cardizem 120 mg PO Q 12 hours.  Diabetes mellitus, controlled with no complications  W1X in 91/4782 6.1 indicating good glycemic control.  Continue to hold metformin and use SSI.  CBG's in past 24 hours: 116-133.  DVT prophylaxis:   SCD's for DVT prophylaxis  Code Status: Full.  Family Communication: Plan of care discussed with the patient and her daughter at the bedside today.  Disposition Plan: Home when stable.     IV Access:    PICC placed 03/18/14.   Procedures and diagnostic studies:   Ct Abdomen Pelvis W Contrast 03/15/2014 Percutaneous pigtail drainage catheter over the right lower quadrant unchanged in position as there has been complete resolution of the previously noted right lower quadrant abscess. The previously noted suspected inflamed appendix is now normal in caliber measuring 5-6 mm. New distal small bowel obstructive process with transition point over the right lower quadrant medially below the cecum. Mild associated free fluid in the right mid to lower abdomen. Mild wall thickening and ill definition of the wall of the entire colon with exception of the rectum which may be due to an acute colitis. Other ancillary findings as described unchanged.  Dg Abd 2 Views 03/16/2014 Persistent small bowel obstruction. COPD changes with bibasilar atelectasis.   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 PLAN: Would recommend to continue to NOT flush the percutaneous drainage catheter to encourage fistulous tract healing. If the patient is discharged and ultimately not deemed  a surgical candidate, the patient may attend the drain clinic at the [facility] on 04/01/2014 as clinically indicated.  Dg Abd 2 Views 03/17/2014 Stable partial small bowel obstruction since yesterday. No free intraperitoneal air.  Dg Abd 2 Views 03/19/2014: Persistent mid to distal small bowel obstruction. There has not been dramatic interval change since the previous study.     Dg Abd 2 Views 03/21/2014: 1. Nonobstructive bowel gas pattern.  No free air. 2. Stable right lower quadrant drain catheter.     Medical Consultants:    Dr. Alphonsa Overall, Surgery  Anti-Infectives:    Cipro 03/15/14--->03/18/14  Flagyl 03/15/14--->03/18/14  Subjective:   [name] had a small BM and is now passing flatus.  No abdominal pain or nausea/vomiting.  Objective:    Filed Vitals:   03/20/14 1457 03/20/14 2148 03/21/14 0533 03/21/14 0700  BP: 118/66 138/62 133/58   Pulse: 77 79 77   Temp: 98.2 F (36.8 C) 98.8 F (37.1 C) 98.6 F (37 C)   TempSrc: Oral Oral Oral   Resp: 16 16 18    Height:      Weight:    67.2 kg (148 lb 2.4 oz)  SpO2: 100% 100% 100%     Intake/Output Summary (Last 24 hours) at 03/21/14 0729 Last data filed at 03/21/14 0658  Gross per 24 hour  Intake 2593.97 ml  Output   2150 ml  Net 443.97 ml    Exam: Gen:  NAD Cardiovascular:  RRR, No M/R/G Respiratory:  Lungs diminished Gastrointestinal:  Abdomen soft, NT/ND, + BS Extremities:  No C/E/C   Data Reviewed:   Basic Metabolic Panel:  Recent Labs Lab 03/17/14 0505 03/18/14 0530 03/19/14 0530 03/20/14 0530 03/21/14 0545  NA 136 136 134* 134* 138  K 4.0 3.4* 3.3* 3.7 3.9  CL 103 104 98 101 106  CO2 31 26 28 29 27   GLUCOSE 82 97 163* 140* 145*  BUN 11 8 6 9 14   CREATININE 0.59 0.55 0.52 0.47* 0.47*  CALCIUM 8.2* 8.2* 8.3* 8.3* 8.2*  MG  --  1.4* 1.7 1.8 1.6  PHOS  --   --  2.5 2.8 3.5   GFR Estimated Creatinine Clearance: 50.8 mL/min (by C-G formula based on Cr of 0.47). Liver Function Tests:  Recent Labs Lab 03/15/14 1114 03/16/14 0411 03/18/14 0530  03/19/14 0530 03/20/14 0530  AST 23 23 25 28 24   ALT 12 13 14 13 12   ALKPHOS 62 63 55 59 55  BILITOT 0.7 0.4 0.6 0.5 0.3  PROT 6.1 6.3 6.1 6.3 6.1  ALBUMIN 2.8* 2.9* 2.7* 2.8* 2.7*    Recent Labs Lab 03/15/14 1114  LIPASE 12   Coagulation profile  Recent Labs Lab 03/21/14 0545  INR 1.20    CBC:  Recent Labs Lab 03/15/14 1114 03/16/14 0411 03/17/14 0505 03/18/14 0530 03/19/14 0530 03/21/14 0545  WBC 5.2 5.8 5.8 5.1 5.6 4.7  NEUTROABS 3.0  --  2.6  --  2.9  --   HGB 9.1* 10.2* 9.7* 9.6* 10.2* 9.4*  HCT 27.2* 30.5* 28.7* 28.9* 29.9* 28.3*  MCV 89.5 89.2 90.5 90.6 90.6 91.3  PLT 339 337 324 302 295 215   CBG:  Recent Labs Lab 03/20/14 1245 03/20/14 1629 03/20/14 2018 03/21/14 0004 03/21/14 0429  GLUCAP 130* 121* 124* 133* 116*   Lipid Profile  Recent Labs  03/19/14 0530  TRIG 69   Microbiology No results found for this or any previous visit (from the  past 240 hour(s)).   Medications:   . atenolol  100 mg Oral Daily  . diltiazem  120 mg Oral Q12H  . enalapril  20 mg Oral Daily  . enoxaparin (LOVENOX) injection  40 mg Subcutaneous Q24H  . insulin aspart  0-15 Units Subcutaneous 6 times per day  . sodium chloride  10-40 mL Intracatheter Q12H   Continuous Infusions: . 0.9 % NaCl with KCl 40 mEq / L 50 mL/hr (03/20/14 1148)  . Marland KitchenTPN (CLINIMIX-E) Adult 55 mL/hr at 03/20/14 1726   And  . fat emulsion 240 mL (03/20/14 1725)    Time spent: 25 minutes.   LOS: 6 days   [employer] [facility] Pager [phone]. If unable to reach me by pager, please call my cell phone at [phone].  *Please refer to [email or web address], password [email or web address] to get updated schedule on who will round on this patient, as hospitalists switch teams weekly. If 7PM-7AM, please contact night-coverage at [email or web address], password [email or web address] for any overnight needs.  03/21/2014, 7:29 AM

## 2014-03-21 NOTE — Progress Notes (Signed)
Patient with + stool (loose pieces with several formed pieces of light brown stool mixed with urine). + flatus.

## 2014-03-21 NOTE — Progress Notes (Signed)
Patient ambulated in hall with walker (and with staff assist with IV pole). Made 2 full "laps" around unit. Even walked down to beach mural  To service elevators and back to unit.

## 2014-03-22 LAB — GLUCOSE, CAPILLARY
Glucose-Capillary: 114 mg/dL — ABNORMAL HIGH (ref 70–99)
Glucose-Capillary: 128 mg/dL — ABNORMAL HIGH (ref 70–99)
Glucose-Capillary: 134 mg/dL — ABNORMAL HIGH (ref 70–99)
Glucose-Capillary: 138 mg/dL — ABNORMAL HIGH (ref 70–99)
Glucose-Capillary: 144 mg/dL — ABNORMAL HIGH (ref 70–99)
Glucose-Capillary: 95 mg/dL (ref 70–99)

## 2014-03-22 LAB — BASIC METABOLIC PANEL
Anion gap: 5 (ref 5–15)
BUN: 14 mg/dL (ref 6–23)
CO2: 26 mmol/L (ref 19–32)
CREATININE: 0.5 mg/dL (ref 0.50–1.10)
Calcium: 8.4 mg/dL (ref 8.4–10.5)
Chloride: 106 mEq/L (ref 96–112)
GFR calc non Af Amer: 88 mL/min — ABNORMAL LOW (ref >90)
GLUCOSE: 87 mg/dL (ref 70–99)
POTASSIUM: 4.5 mmol/L (ref 3.5–5.1)
Sodium: 137 mmol/L (ref 135–145)

## 2014-03-22 MED ORDER — POTASSIUM CHLORIDE IN NACL 40-0.9 MEQ/L-% IV SOLN
INTRAVENOUS | Status: DC
  Administered 2014-03-23 – 2014-03-24 (×2): 30 mL/h via INTRAVENOUS
  Filled 2014-03-22 (×4): qty 1000

## 2014-03-22 MED ORDER — FAT EMULSION 20 % IV EMUL
250.0000 mL | INTRAVENOUS | Status: AC
  Administered 2014-03-22: 250 mL via INTRAVENOUS
  Filled 2014-03-22: qty 250

## 2014-03-22 MED ORDER — TRACE MINERALS CR-CU-F-FE-I-MN-MO-SE-ZN IV SOLN
INTRAVENOUS | Status: AC
  Administered 2014-03-22: 17:00:00 via INTRAVENOUS
  Filled 2014-03-22: qty 2000

## 2014-03-22 NOTE — Progress Notes (Addendum)
[facility] NOTE  Pharmacy Consult for TPN Indication: SBO  Allergies  Allergen Reactions  . Ace Inhibitors Cough  . Crestor [Rosuvastatin]   . Minoxidil   . Penicillins     unknwon  . Grapefruit Extract Rash   Patient Measurements: Height: 5\' 6"  (167.6 cm) Weight: 149 lb 9.6 oz (67.858 kg) IBW/kg (Calculated) : 59.3 Usual Weight: 83.6kg on 03/22/13  Vital Signs: Temp: 97.8 F (36.6 C) (01/02 0514) Temp Source: Oral (01/02 0514) BP: 122/55 mmHg (01/02 0514) Pulse Rate: 70 (01/02 0514) Intake/Output from previous day: 01/01 0701 - 01/02 0700 In: 2559.4 [P.O.:1680; TPN:879.4] Out: 2550 [Urine:2550] Intake/Output from this shift: Total I/O In: -  Out: 200 [Urine:200]  Labs:  Recent Labs  03/21/14 0545  WBC 4.7  HGB 9.4*  HCT 28.3*  PLT 215  APTT 32  INR 1.20    Recent Labs  03/20/14 0530 03/21/14 0545 03/22/14 0650  NA 134* 138 137  K 3.7 3.9 4.5  CL 101 106 106  CO2 29 27 26   GLUCOSE 140* 145* 87  BUN 9 14 14   CREATININE 0.47* 0.47* 0.50  CALCIUM 8.3* 8.2* 8.4  MG 1.8 1.6  --   PHOS 2.8 3.5  --   PROT 6.1  --   --   ALBUMIN 2.7*  --   --   AST 24  --   --   ALT 12  --   --   ALKPHOS 55  --   --   BILITOT 0.3  --   --    Estimated Creatinine Clearance: 50.8 mL/min (by C-G formula based on Cr of 0.5).    Recent Labs  03/22/14 0022 03/22/14 0423 03/22/14 0733  GLUCAP 128* 134* 95   Medical History: Past Medical History  Diagnosis Date  . Hypertension   . Hyperlipidemia   . Diabetes mellitus without complication   . Vitamin D deficiency   . Asthma   . DDD (degenerative disc disease)   . Spinal stenosis   . Type II or unspecified type diabetes mellitus without mention of complication, not stated as uncontrolled 03/22/2013   Medications:   Infusions:  . 0.9 % NaCl with KCl 40 mEq / L 50 mL/hr (03/21/14 0547)  . Marland KitchenTPN (CLINIMIX-E) Adult 75 mL/hr at 03/21/14 1700   And  . fat emulsion 240 mL (03/21/14 1701)    Insulin Requirements:  10 units moderate Novolog correction scale  Current Nutrition:  Regular diet 1/2 Clinimix E 5/15 at 75 ml/hr Lipids 20% at 10 ml/hr  IVF:  NS + 40 mEq KCl @ 50/hr  Central access: PICC placed 12/29 TPN start date: 12/29  ASSESSMENT                                                                                                          HPI:  34 yoF admitted 12/26 with c/o abdominal pain, N/V x 1 day PTA. Pt with recent admit 12/10-12/20 for ruptured appendicitis and appendiceal abscess, pt is s/p RLQ percutaneous drain placed 12/17. Pt was scheduled  for outpt follow up with [facility] on 1/6, but presented back to the ED. CT on admission showed resolved RLQ collection, but also shows SBO. Pharmacy is consulted to dose TPN. Noted patient with Hx diabetes  Significant events:  12/31: Not passing gas yet. Possible laparotomy in next few days if no improvement 1/1: NGT removed, passing gas, + BMs 1/2: advance from clear liquids to regular diet  Today 03/22/2014: tolerated clears 1/1, advance diet today, orders to start weaning TPN per TRH.    Glucose - at goal < 150 with moderate amounts of insulin  Electrolytes -  WNL  Renal - SCr low- stable, CrCl ~50 ml/min  LFTs - WNL  TGs - 69 (12/30)  Prealbumin - 17.4 (12/29)  NG output ~ 1.9L/24 hr  NUTRITIONAL GOALS                                                            RD recs: 1900-2100 kcal/d, 90-100 gm protein/d  Clinimix / at a goal rate of 64ml/hr + 20% fat emulsion at 22ml/hr to provide: 100g/day protein, 1894Kcal/day.  PLAN                                                                                          At 1800 today:  Orders to wean TPN with start of regular diet today, at 6pm tonight, will decrease TPN to Clinimix E 5/15 at 23ml/hr.  Decrease 20% fat emulsion at 76ml/hr.  Wean TPN as tolerates diet  Reduce MIVF to 33ml/hr as per 1/1 note  TPN to contain standard multivitamins and  trace elements.  Continue SSI moderate scale and continue CBG checks q4h   TPN lab panels in AM and on Mondays & Thursdays.  F/u daily.  Doreene Eland, PharmD, BCPS.   Pager: [phone] 03/22/2014, 8:30 AM

## 2014-03-22 NOTE — Progress Notes (Signed)
Pt walked 3 laps and walked to [address].  Per pt, she had BM (thought it was large and "it passed easily").  Stool not visualized by RN

## 2014-03-22 NOTE — Progress Notes (Signed)
Progress Note   Kristine Burnett DQQ:[id number] DOB: 03/16/1930 DOA: 03/15/2014 PCP: Alesia Richards, MD   Brief Narrative:   Kristine Burnett is an 81 y.o. female with history of diabetes mellitus, hypertension, hyperlipidemia and recent history of bowel perforation with  RLQ abscess status post percutaneous drainage placement, discharged on 7 days of Cipro/Flagyl, who was admitted 03/15/14 with ongoing a chief complaint of a 24-hour history of nausea and vomiting. Patient did not have abdominal pain at home but started to develop abdominal pain in ED. She was found to have SBO based on CT scan. She was hemodynamically stable in ED. NG tube was placed.   Interventional radiology evaluated  the patient to see if current percutaneous drain can be removed. 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 Per IR, would recommend to continue to NOT flush the percutaneous drainage catheter to encourage fistulous tract healing.  Assessment/Plan:    Principal Problem: Small bowel obstruction  Treated with supportive care with NG tube placement, IV fluids, antiemetics and analgesia as needed.  Ambulating multiple times a day in hallway.  Being followed by general surgery.  SBO resolved radiographically and clinically 03/21/14.  Tolerated clear liquids. Advance to regular.  Wean TNA.  Active problems:   Bowel perforation / right lower quadrant abscess / leukocytosis / possible colitis  On recent admission. Pt underwent percutaneous drainage placement and was discharged on a seven-day course of Cipro/Flagyl.  Abcess resolved. 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 Continue percutaneous drainage to gravity.  Malnutrition of moderate degree    TNA started 03/18/14. Will wean.  Hypokalemia  Secondary to GI losses. Potassium supplemented.   Essential  hypertension  Continue home antihypertensive medications: atenolol 100 mg daily, enalapril 20 mg daily and Cardizem 120 mg PO Q 12 hours.  Diabetes mellitus, controlled with no complications  J9E in 17/4081 6.1 indicating good glycemic control.  Continue to hold metformin and use SSI.  CBG's in past 24 hours: 95-143.  DVT prophylaxis:   SCD's for DVT prophylaxis  Code Status: Full.  Family Communication: No family present today.  Disposition Plan: Home when stable.     IV Access:    PICC placed 03/18/14.   Procedures and diagnostic studies:   Ct Abdomen Pelvis W Contrast 03/15/2014 Percutaneous pigtail drainage catheter over the right lower quadrant unchanged in position as there has been complete resolution of the previously noted right lower quadrant abscess. The previously noted suspected inflamed appendix is now normal in caliber measuring 5-6 mm. New distal small bowel obstructive process with transition point over the right lower quadrant medially below the cecum. Mild associated free fluid in the right mid to lower abdomen. Mild wall thickening and ill definition of the wall of the entire colon with exception of the rectum which may be due to an acute colitis. Other ancillary findings as described unchanged.  Dg Abd 2 Views 03/16/2014 Persistent small bowel obstruction. COPD changes with bibasilar atelectasis.   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 PLAN: Would recommend to continue to NOT flush the percutaneous drainage catheter to encourage fistulous tract healing. If the patient is discharged and ultimately not deemed a surgical candidate, the patient may attend the drain clinic at the [facility] on 04/01/2014 as clinically indicated.  Dg Abd 2 Views 03/17/2014 Stable  partial small bowel obstruction since yesterday. No free  intraperitoneal air.  Dg Abd 2 Views 03/19/2014: Persistent mid to distal small bowel obstruction. There has not been dramatic interval change since the previous study.     Dg Abd 2 Views 03/21/2014: 1. Nonobstructive bowel gas pattern.  No free air. 2. Stable right lower quadrant drain catheter.     Medical Consultants:    Dr. Alphonsa Overall, Surgery  Anti-Infectives:    Cipro 03/15/14--->03/18/14  Flagyl 03/15/14--->03/18/14  Subjective:   Kristine Burnett continues to be moving her bowels/passing flatus with no N/V or complaints of abdominal pain.    Objective:    Filed Vitals:   03/21/14 0700 03/21/14 1300 03/21/14 2026 03/22/14 0514  BP:  127/75 146/81 122/55  Pulse:  79 80 70  Temp:  98.7 F (37.1 C) 98.6 F (37 C) 97.8 F (36.6 C)  TempSrc:  Oral Oral Oral  Resp:  16 16 16   Height:      Weight: 67.2 kg (148 lb 2.4 oz)   67.858 kg (149 lb 9.6 oz)  SpO2:  100% 100% 100%    Intake/Output Summary (Last 24 hours) at 03/22/14 0759 Last data filed at 03/22/14 0600  Gross per 24 hour  Intake 2559.42 ml  Output   2550 ml  Net   9.42 ml    Exam: Gen:  NAD Cardiovascular:  RRR, No M/R/G Respiratory:  Lungs CTAB Gastrointestinal:  Abdomen soft, NT/ND, + BS Extremities:  No C/E/C   Data Reviewed:   Basic Metabolic Panel:  Recent Labs Lab 03/18/14 0530 03/19/14 0530 03/20/14 0530 03/21/14 0545 03/22/14 0650  NA 136 134* 134* 138 137  K 3.4* 3.3* 3.7 3.9 4.5  CL 104 98 101 106 106  CO2 26 28 29 27 26   GLUCOSE 97 163* 140* 145* 87  BUN 8 6 9 14 14   CREATININE 0.55 0.52 0.47* 0.47* 0.50  CALCIUM 8.2* 8.3* 8.3* 8.2* 8.4  MG 1.4* 1.7 1.8 1.6  --   PHOS  --  2.5 2.8 3.5  --    GFR Estimated Creatinine Clearance: 50.8 mL/min (by C-G formula based on Cr of 0.5). Liver Function Tests:  Recent Labs Lab 03/15/14 1114 03/16/14 0411 03/18/14 0530 03/19/14 0530 03/20/14 0530  AST 23 23 25 28 24   ALT 12 13 14 13 12   ALKPHOS 62 63 55 59 55  BILITOT 0.7  0.4 0.6 0.5 0.3  PROT 6.1 6.3 6.1 6.3 6.1  ALBUMIN 2.8* 2.9* 2.7* 2.8* 2.7*    Recent Labs Lab 03/15/14 1114  LIPASE 12   Coagulation profile  Recent Labs Lab 03/21/14 0545  INR 1.20    CBC:  Recent Labs Lab 03/15/14 1114 03/16/14 0411 03/17/14 0505 03/18/14 0530 03/19/14 0530 03/21/14 0545  WBC 5.2 5.8 5.8 5.1 5.6 4.7  NEUTROABS 3.0  --  2.6  --  2.9  --   HGB 9.1* 10.2* 9.7* 9.6* 10.2* 9.4*  HCT 27.2* 30.5* 28.7* 28.9* 29.9* 28.3*  MCV 89.5 89.2 90.5 90.6 90.6 91.3  PLT 339 337 324 302 295 215   CBG:  Recent Labs Lab 03/21/14 1610 03/21/14 2003 03/22/14 0022 03/22/14 0423 03/22/14 0733  GLUCAP 97 143* 128* 134* 95   Lipid Profile No results for input(s): CHOL, HDL, LDLCALC, TRIG, CHOLHDL, LDLDIRECT in the last 72 hours. Microbiology No results found for this or any previous visit (from the past 240 hour(s)).   Medications:   . atenolol  100 mg Oral Daily  .  diltiazem  120 mg Oral Q12H  . enalapril  20 mg Oral Daily  . enoxaparin (LOVENOX) injection  40 mg Subcutaneous Q24H  . insulin aspart  0-15 Units Subcutaneous 6 times per day  . sodium chloride  10-40 mL Intracatheter Q12H   Continuous Infusions: . 0.9 % NaCl with KCl 40 mEq / L 50 mL/hr (03/21/14 0547)  . Marland KitchenTPN (CLINIMIX-E) Adult 75 mL/hr at 03/21/14 1700   And  . fat emulsion 240 mL (03/21/14 1701)    Time spent: 25 minutes.   LOS: 7 days   [facility] Pager [phone]. If unable to reach me by pager, please call my cell phone at [phone].  *Please refer to [email or web address], password [email or web address] to get updated schedule on who will round on this patient, as hospitalists switch teams weekly. If 7PM-7AM, please contact night-coverage at [email or web address], password [email or web address] for any overnight needs.  03/22/2014, 7:59 AM

## 2014-03-22 NOTE — Progress Notes (Signed)
Walked 2 laps in hallway with nurse tech

## 2014-03-22 NOTE — Progress Notes (Signed)
Patient ID: Kristine Burnett, female   DOB: 12-Feb-1930, 81 y.o.   MRN: [id number]  General Surgery - [facility] - Progress Note  Subjective: Patient pleasant, no complaints.  Tolerated clear liquid diet yesterday.  Wants to eat solid food.  Continued flatus and BM's per patient.  Denies nausea or emesis.  Objective: Vital signs in last 24 hours: Temp:  [97.8 F (36.6 C)-98.7 F (37.1 C)] 97.8 F (36.6 C) (01/02 0514) Pulse Rate:  [70-80] 70 (01/02 0514) Resp:  [16] 16 (01/02 0514) BP: (122-146)/(55-81) 122/55 mmHg (01/02 0514) SpO2:  [100 %] 100 % (01/02 0514) Weight:  [149 lb 9.6 oz (67.858 kg)] 149 lb 9.6 oz (67.858 kg) (01/02 0514) Last BM Date: 04/11/2014  Intake/Output from previous day: 2022/04/11 0701 - 01/02 0700 In: 2559.4 [P.O.:1680; TPN:879.4] Out: 2550 [Urine:2550]  Exam: HEENT - clear, not icteric Neck - soft Chest - clear bilaterally Cor - RRR, no murmur Abd - soft without distension; few BS present; non-tender; no output from drain Ext - no significant edema Neuro - grossly intact, no focal deficits  Lab Results:   Recent Labs  2014-04-11 0545  WBC 4.7  HGB 9.4*  HCT 28.3*  PLT 215     Recent Labs  03/20/14 0530 11-Apr-2014 0545  NA 134* 138  K 3.7 3.9  CL 101 106  CO2 29 27  GLUCOSE 140* 145*  BUN 9 14  CREATININE 0.47* 0.47*  CALCIUM 8.3* 8.2*    Studies/Results: Dg Abd 2 Views  11-Apr-2014   CLINICAL DATA:  Small bowel obstruction  EXAM: ABDOMEN - 2 VIEW  COMPARISON:  03/19/2014  FINDINGS: Nasogastric tube stable in position. Stomach decompressed. Central line tip noted at the cavoatrial junction.  No free air. Visualized lung bases clear. Surgical clips right upper abdomen.  Normal bowel gas pattern. Progression of enteral contrast material to distal colon and nondilated rectum.  Pigtail drain catheter right lower quadrant.  Regional bones unremarkable.  IMPRESSION: 1. Nonobstructive bowel gas pattern.  No free air. 2. Stable right  lower quadrant drain catheter.   Electronically Signed   By: Arne Cleveland M.D.   On: 2014-04-11 09:41    Assessment / Plan: 1. Small bowel obstruction Continued signs of improvement Advance to regular diet today  If tolerated, will begin to wean TNA tomorrow 1/3 2. Perforated appendicitis Drain remains in place, no irrigation due to fistulous connection Abscess resolved - off abx's  Will discuss timing of repeat injection study with IR on Monday 1/4  Earnstine Regal, MD, [facility] Office: [phone]  03/22/2014

## 2014-03-22 NOTE — Progress Notes (Signed)
Ambulated in hallway x2 laps.  Tolerated well.

## 2014-03-23 LAB — GLUCOSE, CAPILLARY
GLUCOSE-CAPILLARY: 172 mg/dL — AB (ref 70–99)
GLUCOSE-CAPILLARY: 105 mg/dL — AB (ref 70–99)
Glucose-Capillary: 104 mg/dL — ABNORMAL HIGH (ref 70–99)
Glucose-Capillary: 118 mg/dL — ABNORMAL HIGH (ref 70–99)
Glucose-Capillary: 129 mg/dL — ABNORMAL HIGH (ref 70–99)
Glucose-Capillary: 130 mg/dL — ABNORMAL HIGH (ref 70–99)
Glucose-Capillary: 139 mg/dL — ABNORMAL HIGH (ref 70–99)

## 2014-03-23 LAB — BASIC METABOLIC PANEL
Anion gap: 3 — ABNORMAL LOW (ref 5–15)
BUN: 18 mg/dL (ref 6–23)
CALCIUM: 8.5 mg/dL (ref 8.4–10.5)
CO2: 28 mmol/L (ref 19–32)
CREATININE: 0.61 mg/dL (ref 0.50–1.10)
Chloride: 107 mEq/L (ref 96–112)
GFR calc Af Amer: >90 mL/min (ref >90)
GFR calc non Af Amer: 82 mL/min — ABNORMAL LOW (ref >90)
Glucose, Bld: 115 mg/dL — ABNORMAL HIGH (ref 70–99)
Potassium: 4.2 mmol/L (ref 3.5–5.1)
Sodium: 138 mmol/L (ref 135–145)

## 2014-03-23 MED ORDER — TRACE MINERALS CR-CU-F-FE-I-MN-MO-SE-ZN IV SOLN
INTRAVENOUS | Status: DC
  Administered 2014-03-23: 18:00:00 via INTRAVENOUS
  Filled 2014-03-23: qty 1000

## 2014-03-23 MED ORDER — FAT EMULSION 20 % IV EMUL
250.0000 mL | INTRAVENOUS | Status: DC
  Administered 2014-03-23: 250 mL via INTRAVENOUS
  Filled 2014-03-23: qty 250

## 2014-03-23 NOTE — Plan of Care (Signed)
Problem: Phase III Progression Outcomes Goal: Activity at appropriate level-compared to baseline (UP IN CHAIR FOR HEMODIALYSIS)  Outcome: Completed/Met Date Met:  03/23/14 Ambulating around the unit 3x every few hours

## 2014-03-23 NOTE — Progress Notes (Signed)
[place] NOTE  Pharmacy Consult for TPN Indication: SBO  Allergies  Allergen Reactions  . Ace Inhibitors Cough  . Crestor [Rosuvastatin]   . Minoxidil   . Penicillins     unknwon  . Grapefruit Extract Rash   Patient Measurements: Height: 5\' 6"  (167.6 cm) Weight: 148 lb 8 oz (67.359 kg) IBW/kg (Calculated) : 59.3 Usual Weight: 83.6kg on 03/22/13  Vital Signs: Temp: 98.8 F (37.1 C) (01/03 0627) Temp Source: Oral (01/03 0627) BP: 122/64 mmHg (01/03 0627) Pulse Rate: 74 (01/03 0627) Intake/Output from previous day: 01/02 0701 - 01/03 0700 In: 2885 [P.O.:1958; I.V.:160; TPN:765] Out: 2650 [Urine:2650] Intake/Output from this shift:    Labs:  Recent Labs  03/21/14 0545  WBC 4.7  HGB 9.4*  HCT 28.3*  PLT 215  APTT 32  INR 1.20    Recent Labs  03/21/14 0545 03/22/14 0650 03/23/14 0615  NA 138 137 138  K 3.9 4.5 4.2  CL 106 106 107  CO2 27 26 28   GLUCOSE 145* 87 115*  BUN 14 14 18   CREATININE 0.47* 0.50 0.61  CALCIUM 8.2* 8.4 8.5  MG 1.6  --   --   PHOS 3.5  --   --    Estimated Creatinine Clearance: 50.8 mL/min (by C-G formula based on Cr of 0.61).    Recent Labs  03/23/14 0010 03/23/14 0412 03/23/14 0737  GLUCAP 104* 129* 118*   Medical History: Past Medical History  Diagnosis Date  . Hypertension   . Hyperlipidemia   . Diabetes mellitus without complication   . Vitamin D deficiency   . Asthma   . DDD (degenerative disc disease)   . Spinal stenosis   . Type II or unspecified type diabetes mellitus without mention of complication, not stated as uncontrolled 03/22/2013   Medications:   Infusions:  . 0.9 % NaCl with KCl 40 mEq / L 30 mL/hr (03/23/14 0227)  . Marland KitchenTPN (CLINIMIX-E) Adult 50 mL/hr at 03/22/14 1725   And  . fat emulsion 250 mL (03/22/14 1725)   Insulin Requirements:  6 units moderate Novolog correction scale  Current Nutrition:  Regular diet 1/2 Clinimix E 5/15 at  50 ml/hr Lipids 20% at 8 ml/hr  IVF:   NS + 40 mEq KCl @ 30/hr  Central access: PICC placed 12/29 TPN start date: 12/29  ASSESSMENT                                                                                                          HPI:  30 yoF admitted 12/26 with c/o abdominal pain, N/V x 1 day PTA. Pt with recent admit 12/10-12/20 for ruptured appendicitis and appendiceal abscess, pt is s/p RLQ percutaneous drain placed 12/17. Pt was scheduled for outpt follow up with [facility] on 1/6, but presented back to the ED. CT on admission showed resolved RLQ collection, but also shows SBO. Pharmacy is consulted to dose TPN. Noted patient with Hx diabetes  Significant events:  12/31: Not passing gas yet. Possible laparotomy in next few days if no improvement  1/1: NGT removed, passing gas, + BMs 1/2: advance from clear liquids to regular diet 1/3: tolerating regular diet with formed BMs, IR to re-assess fistula 1/4  Today 03/23/2014: tolerated clears 1/1, advance diet today, orders to start weaning TPN per TRH.    Glucose - at goal < 150 with reduced need for insulin with TPN weaning  Electrolytes -  WNL  Renal - SCr low- stable, CrCl ~50 ml/min  LFTs - WNL  TGs - 69 (12/30)  Prealbumin - 17.4 (12/29)  NG output ~ 1.9L/24 hr  NUTRITIONAL GOALS                                                            RD recs: 1900-2100 kcal/d, 90-100 gm protein/d  Clinimix / at a goal rate of 46ml/hr + 20% fat emulsion at 19ml/hr to provide: 100g/day protein, 1894Kcal/day.  PLAN                                                                                          At 1800 today:  Continue to wean TPN with start of regular diet 1/2, at 6pm tonight, will decrease TPN to Clinimix E 5/15 at 36ml/hr.  Decrease 20% fat emulsion at 61ml/hr.  Wean TPN as tolerates diet  TPN to contain standard multivitamins and trace elements.  Continue SSI moderate scale and continue CBG checks q4h   TPN lab panels in AM and on Mondays &  Thursdays.  F/u daily.  Doreene Eland, PharmD, BCPS.   Pager: [phone] 03/23/2014, 11:39 AM

## 2014-03-23 NOTE — Progress Notes (Signed)
Patient ID: Kristine Burnett, female   DOB: 23-Sep-1929, 81 y.o.   MRN: [id number]  General Surgery - [facility] - Progress Note  Subjective: Patient in bed, family at bedside.  No complaints this AM.  Voiding and having formed BM's.  Ambulated several times yesterday.  Denies nausea or emesis.  No output from drain.  Objective: Vital signs in last 24 hours: Temp:  [97.8 F (36.6 C)-98.8 F (37.1 C)] 98.8 F (37.1 C) (01/03 0627) Pulse Rate:  [73-80] 74 (01/03 0627) Resp:  [16] 16 (01/03 0627) BP: (114-127)/(57-64) 122/64 mmHg (01/03 0627) SpO2:  [100 %] 100 % (01/03 0627) Weight:  [148 lb 8 oz (67.359 kg)] 148 lb 8 oz (67.359 kg) (01/03 0627) Last BM Date: 03/22/14  Intake/Output from previous day: 01/02 0701 - 01/03 0700 In: 2885 [P.O.:1960; I.V.:160; TPN:765] Out: 2650 [Urine:2650]  Exam: HEENT - clear, not icteric Neck - soft Chest - clear bilaterally Cor - RRR, no murmur Abd - soft without distension; non-tender; BS present Ext - no significant edema Neuro - grossly intact, no focal deficits  Lab Results:   Recent Labs  Mar 26, 2014 0545  WBC 4.7  HGB 9.4*  HCT 28.3*  PLT 215     Recent Labs  03/22/14 0650 03/23/14 0615  NA 137 138  K 4.5 4.2  CL 106 107  CO2 26 28  GLUCOSE 87 115*  BUN 14 18  CREATININE 0.50 0.61  CALCIUM 8.4 8.5    Studies/Results: Dg Abd 2 Views  03-26-14   CLINICAL DATA:  Small bowel obstruction  EXAM: ABDOMEN - 2 VIEW  COMPARISON:  03/19/2014  FINDINGS: Nasogastric tube stable in position. Stomach decompressed. Central line tip noted at the cavoatrial junction.  No free air. Visualized lung bases clear. Surgical clips right upper abdomen.  Normal bowel gas pattern. Progression of enteral contrast material to distal colon and nondilated rectum.  Pigtail drain catheter right lower quadrant.  Regional bones unremarkable.  IMPRESSION: 1. Nonobstructive bowel gas pattern.  No free air. 2. Stable right lower quadrant drain  catheter.   Electronically Signed   By: Arne Cleveland M.D.   On: 03/26/2014 09:41    Assessment / Plan: 1. Small bowel obstruction - resolved Regular diet  Weaning TNA 2. Perforated appendicitis Drain remains in place, no irrigation due to fistulous connection Abscess resolved - off abx's Will discuss timing of repeat injection study with IR on Monday 1/4  Earnstine Regal, MD, [facility] Office: [phone]  03/23/2014

## 2014-03-23 NOTE — Progress Notes (Signed)
Progress Note   Kristine Burnett:[id number] DOB: [birth year] DOA: 03/15/2014 PCP: Alesia Richards, MD   Brief Narrative:   Kristine Burnett is an 81 y.o. female with history of diabetes mellitus, hypertension, hyperlipidemia and recent history of bowel perforation with  RLQ abscess status post percutaneous drainage placement, discharged on 7 days of Cipro/Flagyl, who was admitted 03/15/14 with ongoing a chief complaint of a 24-hour history of nausea and vomiting. Patient did not have abdominal pain at home but started to develop abdominal pain in ED. She was found to have SBO based on CT scan. She was hemodynamically stable in ED. NG tube was placed.   Interventional radiology evaluated  the patient to see if current percutaneous drain can be removed. 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 Per IR, would recommend to continue to NOT flush the percutaneous drainage catheter to encourage fistulous tract healing.  Assessment/Plan:    Principal Problem: Small bowel obstruction  Treated with supportive care with NG tube placement, IV fluids, antiemetics and analgesia as needed.  Ambulating multiple times a day in hallway.  Being followed by general surgery.  SBO resolved radiographically and clinically 03/21/14.  Tolerated regular diet.  Weaning TNA.  Active problems:   Bowel perforation / right lower quadrant abscess / leukocytosis / possible colitis  On recent admission. Pt underwent percutaneous drainage placement and was discharged on a seven-day course of Cipro/Flagyl.  Abcess resolved. 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 Continue percutaneous drainage to gravity.  IR to re-assess 03/24/14.  ? Repeat injection study.  Malnutrition of moderate degree    TNA started 03/18/14. Weaning.  Hypokalemia  Secondary to GI losses. Potassium supplemented.    Essential hypertension  Continue home antihypertensive medications: atenolol 100 mg daily, enalapril 20 mg daily and Cardizem 120 mg PO Q 12 hours.  Diabetes mellitus, controlled with no complications  J0K in 93/8182 6.1 indicating good glycemic control.  Continue to hold metformin and use SSI.  CBG's in past 24 hours: 104-138.  DVT prophylaxis:   SCD's for DVT prophylaxis  Code Status: Full.  Family Communication: Daughter updated at the bedside.  Disposition Plan: Home when stable.     IV Access:    PICC placed 03/18/14.   Procedures and diagnostic studies:   Ct Abdomen Pelvis W Contrast 03/15/2014 Percutaneous pigtail drainage catheter over the right lower quadrant unchanged in position as there has been complete resolution of the previously noted right lower quadrant abscess. The previously noted suspected inflamed appendix is now normal in caliber measuring 5-6 mm. New distal small bowel obstructive process with transition point over the right lower quadrant medially below the cecum. Mild associated free fluid in the right mid to lower abdomen. Mild wall thickening and ill definition of the wall of the entire colon with exception of the rectum which may be due to an acute colitis. Other ancillary findings as described unchanged.  Dg Abd 2 Views 03/16/2014 Persistent small bowel obstruction. COPD changes with bibasilar atelectasis.   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 PLAN: Would recommend to continue to NOT flush the percutaneous drainage catheter to encourage fistulous tract healing. If the patient is discharged and ultimately not deemed a surgical candidate, the patient may attend the drain clinic at the [facility] on 04/01/2014 as clinically indicated.  Dg Abd 2 Views 03/17/2014 Stable partial small bowel obstruction  since yesterday. No free intraperitoneal air.  Dg Abd 2 Views 03/19/2014: Persistent mid to distal small bowel obstruction. There has not been dramatic interval change since the previous study.     Dg Abd 2 Views 03/21/2014: 1. Nonobstructive bowel gas pattern.  No free air. 2. Stable right lower quadrant drain catheter.     Medical Consultants:    Dr. Alphonsa Overall, Surgery  Anti-Infectives:    Cipro 03/15/14--->03/18/14  Flagyl 03/15/14--->03/18/14  Subjective:   Kristine Burnett continues to be moving her bowels/passing flatus with no N/V or complaints of abdominal pain.  Appetite good.  Ambulates in halls several times a day.  Objective:    Filed Vitals:   03/22/14 0514 03/22/14 1435 03/22/14 2200 03/23/14 0627  BP: 122/55 127/57 114/58 122/64  Pulse: 70 73 80 74  Temp: 97.8 F (36.6 C) 98.1 F (36.7 C) 97.8 F (36.6 C) 98.8 F (37.1 C)  TempSrc: Oral Oral Oral Oral  Resp: 16 16 16 16   Height:      Weight: 67.858 kg (149 lb 9.6 oz)   67.359 kg (148 lb 8 oz)  SpO2: 100% 100% 100% 100%    Intake/Output Summary (Last 24 hours) at 03/23/14 1014 Last data filed at 03/23/14 0607  Gross per 24 hour  Intake   2005 ml  Output   2250 ml  Net   -245 ml    Exam: Gen:  NAD Cardiovascular:  RRR, No M/R/G Respiratory:  Lungs CTAB Gastrointestinal:  Abdomen soft, NT/ND, + BS Extremities:  No C/E/C   Data Reviewed:   Basic Metabolic Panel:  Recent Labs Lab 03/18/14 0530 03/19/14 0530 03/20/14 0530 03/21/14 0545 03/22/14 0650 03/23/14 0615  NA 136 134* 134* 138 137 138  K 3.4* 3.3* 3.7 3.9 4.5 4.2  CL 104 98 101 106 106 107  CO2 26 28 29 27 26 28   GLUCOSE 97 163* 140* 145* 87 115*  BUN 8 6 9 14 14 18   CREATININE 0.55 0.52 0.47* 0.47* 0.50 0.61  CALCIUM 8.2* 8.3* 8.3* 8.2* 8.4 8.5  MG 1.4* 1.7 1.8 1.6  --   --   PHOS  --  2.5 2.8 3.5  --   --    GFR Estimated Creatinine Clearance: 50.8 mL/min (by C-G formula based on Cr of 0.61). Liver Function  Tests:  Recent Labs Lab 03/18/14 0530 03/19/14 0530 03/20/14 0530  AST 25 28 24   ALT 14 13 12   ALKPHOS 55 59 55  BILITOT 0.6 0.5 0.3  PROT 6.1 6.3 6.1  ALBUMIN 2.7* 2.8* 2.7*   Coagulation profile  Recent Labs Lab 03/21/14 0545  INR 1.20    CBC:  Recent Labs Lab 03/17/14 0505 03/18/14 0530 03/19/14 0530 03/21/14 0545  WBC 5.8 5.1 5.6 4.7  NEUTROABS 2.6  --  2.9  --   HGB 9.7* 9.6* 10.2* 9.4*  HCT 28.7* 28.9* 29.9* 28.3*  MCV 90.5 90.6 90.6 91.3  PLT 324 302 295 215   CBG:  Recent Labs Lab 03/22/14 1638 03/22/14 2040 03/23/14 0010 03/23/14 0412 03/23/14 0737  GLUCAP 114* 138* 104* 129* 118*    Medications:   . atenolol  100 mg Oral Daily  . diltiazem  120 mg Oral Q12H  . enalapril  20 mg Oral Daily  . enoxaparin (LOVENOX) injection  40 mg Subcutaneous Q24H  . insulin aspart  0-15 Units Subcutaneous 6 times per day  . sodium chloride  10-40 mL Intracatheter Q12H   Continuous Infusions: . 0.9 % NaCl with KCl 40 mEq / L 30 mL/hr (03/23/14 0227)  . Marland KitchenTPN (CLINIMIX-E) Adult 50 mL/hr at 03/22/14 1725   And  . fat emulsion 250 mL (03/22/14 1725)    Time spent: 25 minutes.   LOS: 8 days   [facility] Pager [phone]. If unable to reach me by pager, please call my cell phone at [phone].  *Please refer to [email or web address], password [email or web address] to get updated schedule on who will round on this patient, as hospitalists switch teams weekly. If 7PM-7AM, please contact night-coverage at [email or web address], password [email or web address] for any overnight needs.  03/23/2014, 10:14 AM

## 2014-03-24 ENCOUNTER — Inpatient Hospital Stay (HOSPITAL_COMMUNITY): Payer: Medicare HMO

## 2014-03-24 LAB — DIFFERENTIAL
Basophils Absolute: 0 10*3/uL (ref 0.0–0.1)
Basophils Relative: 1 % (ref 0–1)
EOS ABS: 0.2 10*3/uL (ref 0.0–0.7)
EOS PCT: 4 % (ref 0–5)
LYMPHS ABS: 2.3 10*3/uL (ref 0.7–4.0)
LYMPHS PCT: 50 % — AB (ref 12–46)
MONO ABS: 0.6 10*3/uL (ref 0.1–1.0)
MONOS PCT: 14 % — AB (ref 3–12)
Neutro Abs: 1.4 10*3/uL — ABNORMAL LOW (ref 1.7–7.7)
Neutrophils Relative %: 31 % — ABNORMAL LOW (ref 43–77)

## 2014-03-24 LAB — CBC
HCT: 26.4 % — ABNORMAL LOW (ref 36.0–46.0)
Hemoglobin: 8.8 g/dL — ABNORMAL LOW (ref 12.0–15.0)
MCH: 30.3 pg (ref 26.0–34.0)
MCHC: 33.3 g/dL (ref 30.0–36.0)
MCV: 91 fL (ref 78.0–100.0)
Platelets: 168 10*3/uL (ref 150–400)
RBC: 2.9 MIL/uL — ABNORMAL LOW (ref 3.87–5.11)
RDW: 16.5 % — AB (ref 11.5–15.5)
WBC: 4.4 10*3/uL (ref 4.0–10.5)

## 2014-03-24 LAB — GLUCOSE, CAPILLARY
GLUCOSE-CAPILLARY: 126 mg/dL — AB (ref 70–99)
GLUCOSE-CAPILLARY: 142 mg/dL — AB (ref 70–99)
GLUCOSE-CAPILLARY: 87 mg/dL (ref 70–99)
Glucose-Capillary: 94 mg/dL (ref 70–99)

## 2014-03-24 LAB — COMPREHENSIVE METABOLIC PANEL
ALT: 8 U/L (ref 0–35)
AST: 16 U/L (ref 0–37)
Albumin: 2.8 g/dL — ABNORMAL LOW (ref 3.5–5.2)
Alkaline Phosphatase: 50 U/L (ref 39–117)
Anion gap: 5 (ref 5–15)
BUN: 16 mg/dL (ref 6–23)
CALCIUM: 8.5 mg/dL (ref 8.4–10.5)
CO2: 26 mmol/L (ref 19–32)
CREATININE: 0.57 mg/dL (ref 0.50–1.10)
Chloride: 104 mEq/L (ref 96–112)
GFR calc Af Amer: >90 mL/min (ref >90)
GFR, EST NON AFRICAN AMERICAN: 84 mL/min — AB (ref >90)
Glucose, Bld: 122 mg/dL — ABNORMAL HIGH (ref 70–99)
Potassium: 3.8 mmol/L (ref 3.5–5.1)
Sodium: 135 mmol/L (ref 135–145)
Total Bilirubin: 0.6 mg/dL (ref 0.3–1.2)
Total Protein: 6 g/dL (ref 6.0–8.3)

## 2014-03-24 LAB — PREALBUMIN: Prealbumin: 16.9 mg/dL — ABNORMAL LOW (ref 17.0–34.0)

## 2014-03-24 LAB — MAGNESIUM: MAGNESIUM: 1.4 mg/dL — AB (ref 1.5–2.5)

## 2014-03-24 LAB — PHOSPHORUS: Phosphorus: 4.3 mg/dL (ref 2.3–4.6)

## 2014-03-24 LAB — TRIGLYCERIDES: Triglycerides: 100 mg/dL (ref <150)

## 2014-03-24 MED ORDER — IOHEXOL 300 MG/ML  SOLN
10.0000 mL | Freq: Once | INTRAMUSCULAR | Status: AC | PRN
  Administered 2014-03-24: 10 mL

## 2014-03-24 MED ORDER — MAGNESIUM OXIDE 400 (241.3 MG) MG PO TABS
400.0000 mg | ORAL_TABLET | Freq: Two times a day (BID) | ORAL | Status: DC
  Administered 2014-03-24: 400 mg via ORAL
  Filled 2014-03-24 (×2): qty 1

## 2014-03-24 MED ORDER — INSULIN ASPART 100 UNIT/ML ~~LOC~~ SOLN: 0.0000 [IU] | Freq: Three times a day (TID) | SUBCUTANEOUS | Status: DC

## 2014-03-24 NOTE — Progress Notes (Signed)
  Subjective: She looks good.  Eating regular diet. Weaning TNA.  I talked with IR and it's a week since last IR injection so we will repeat today.  Objective: Vital signs in last 24 hours: Temp:  [97.8 F (36.6 C)-98.6 F (37 C)] 98.5 F (36.9 C) (01/04 0423) Pulse Rate:  [72-97] 75 (01/04 0423) Resp:  [16-18] 16 (01/04 0423) BP: (135-155)/(56-69) 135/58 mmHg (01/04 0423) SpO2:  [100 %] 100 % (01/04 0423) Weight:  [67.5 kg (148 lb 13 oz)] 67.5 kg (148 lb 13 oz) (01/04 0649) Last BM Date: 03/23/14 960 Po Good urine output +BM x 1 Afebrile, VSS Labs OK mag is low Last sinus tract injection 12/28 CT abd 12 Diet: regular TNA being weaned Intake/Output from previous day: 01/03 0701 - 01/04 0700 In: 1720 [P.O.:960; I.V.:760] Out: 3275 [Urine:3275] Intake/Output this shift:    General appearance: alert, cooperative and no distress GI: soft, non-tender; bowel sounds normal; no masses,  no organomegaly and no complaints about drain discomfort.  Lab Results:   Recent Labs  03/24/14 0430  WBC 4.4  HGB 8.8*  HCT 26.4*  PLT 168    BMET  Recent Labs  03/23/14 0615 03/24/14 0430  NA 138 135  K 4.2 3.8  CL 107 104  CO2 28 26  GLUCOSE 115* 122*  BUN 18 16  CREATININE 0.61 0.57  CALCIUM 8.5 8.5   PT/INR No results for input(s): LABPROT, INR in the last 72 hours.   Recent Labs Lab 03/18/14 0530 03/19/14 0530 03/20/14 0530 03/24/14 0430  AST 25 28 24 16   ALT 14 13 12 8   ALKPHOS 55 59 55 50  BILITOT 0.6 0.5 0.3 0.6  PROT 6.1 6.3 6.1 6.0  ALBUMIN 2.7* 2.8* 2.7* 2.8*     Lipase     Component Value Date/Time   LIPASE 12 03/15/2014 1114     Studies/Results: No results found.  Medications: . atenolol  100 mg Oral Daily  . diltiazem  120 mg Oral Q12H  . enalapril  20 mg Oral Daily  . enoxaparin (LOVENOX) injection  40 mg Subcutaneous Q24H  . insulin aspart  0-15 Units Subcutaneous 6 times per day  . sodium chloride  10-40 mL Intracatheter Q12H     Assessment/Plan 1. SBO 2. Appendiceal abscess, Perc drain on 03/06/2014 by Dr. Earleen Newport, Abscess appears to have resolved. IR injection on 03/07/14 shows pt's RLQ abscess drain demonstrates persistent communication with the adjacent cecum. As such, the drain was not removed.Gravity bag reconnected. Would recommend to continue NOT to flush drain to encourage fistula healing.  3. Mild wall thickening of entire colon wall - unclear what this means. 4. HTN x 20 years 5. DM - just started meds this past summer 6. Weight loss over the last 6 months 7. DJD with spinal stenosis 8. Anemia - Hgb - 9.1 - 03/15/2014     Plan:  We are going to repeat IR drain injection, weaning off TNA, Hopefully we can decide on drain and disposition home.    LOS: 9 days    Kristine Burnett 03/24/2014

## 2014-03-24 NOTE — Progress Notes (Signed)
Discharge instructions explained to pt and daughter.Emphasized to hold METFORMIN until January 7,2016 because of the dye she had today for her test, it could harm her kidneys. They state understanding. Pt's appetite is good. She is in very good spirits to be going home.

## 2014-03-24 NOTE — Discharge Instructions (Signed)
Small Bowel Obstruction °A small bowel obstruction is a blockage (obstruction) of the small intestine (small bowel). The small bowel is a long, slender tube that connects the stomach to the colon. Its job is to absorb nutrients from the fluids and foods you consume into the bloodstream.  °CAUSES  °There are many causes of intestinal blockage. The most common ones include: °· Hernias. This is a more common cause in children than adults. °· Inflammatory bowel disease (enteritis and colitis). °· Twisting of the bowel (volvulus). °· Tumors. °· Scar tissue (adhesions) from previous surgery or radiation treatment. °· Recent surgery. This may cause an acute small bowel obstruction called an ileus. °SYMPTOMS  °· Abdominal pain. This may be dull cramps or sharp pain. It may occur in one area or may be present in the entire abdomen. Pain can range from mild to severe, depending on the degree of obstruction. °· Nausea and vomiting. Vomit may be greenish or yellow bile color. °· Distended or swollen stomach. Abdominal bloating is a common symptom. °· Constipation. °· Lack of passing gas. °· Frequent belching. °· Diarrhea. This may occur if runny stool is able to leak around the obstruction. °DIAGNOSIS  °Your caregiver can usually diagnose small bowel obstruction by taking a history, doing a physical exam, and taking X-rays. If the cause is unclear, a CT scan (computerized tomography) of your abdomen and pelvis may be needed. °TREATMENT  °Treatment of the blockage depends on the cause and how bad the problem is.  °· Sometimes, the obstruction improves with bed rest and intravenous (IV) fluids. °· Resting the bowel is very important. This means following a simple diet. Sometimes, a clear liquid diet may be required for several days. °· Sometimes, a small tube (nasogastric tube) is placed into the stomach to decompress the bowel. When the bowel is blocked, it usually swells up like a balloon filled with air and fluids.  Decompression means that the air and fluids are removed by suction through that tube. This can help with pain, discomfort, and nausea. It can also help the obstruction resolve faster. °· Surgery may be required if other treatments do not work. Bowel obstruction from a hernia may require early surgery and can be an emergency procedure. Adhesions that cause frequent or severe obstructions may also require surgery. °HOME CARE INSTRUCTIONS °If your bowel obstruction is only partial or incomplete, you may be allowed to go home. °· Get plenty of rest. °· Follow your diet as directed by your caregiver. °· Only consume clear liquids until your condition improves. °· Avoid solid foods as instructed. °SEEK IMMEDIATE MEDICAL CARE IF: °· You have increased pain or cramping. °· You vomit blood. °· You have uncontrolled vomiting or nausea. °· You cannot drink fluids due to vomiting or pain. °· You develop confusion. °· You begin feeling very dry or thirsty (dehydrated). °· You have severe bloating. °· You have chills. °· You have a fever. °· You feel extremely weak or you faint. °MAKE SURE YOU: °· Understand these instructions. °· Will watch your condition. °· Will get help right away if you are not doing well or get worse. °Document Released: 05/24/2005 Document Revised: 05/30/2011 Document Reviewed: 05/21/2010 °[employer]® Patient Information ©2015 [employer]. This information is not intended to replace advice given to you by your health care provider. Make sure you discuss any questions you have with your health care provider. ° °

## 2014-03-24 NOTE — Discharge Summary (Signed)
Physician Discharge Summary  Kristine Burnett:[id number] DOB: 02-Jul-1929 DOA: 03/15/2014  PCP: Alesia Richards, MD  Admit date: 03/15/2014 Discharge date: 03/24/2014   Recommendations for Outpatient Follow-Up:   1. Recommend routine F/U of usual chronic medical problems.   Discharge Diagnosis:   Principal Problem:    SBO (small bowel obstruction) Active Problems:    Essential hypertension    Diabetes mellitus type 2, controlled    Right lower quadrant abdominal abscess    Abdominal visceral abscess    Nausea and vomiting    Hypokalemia    Malnutrition of moderate degree    Hypomagnesemia   Discharge Condition: Improved.  Diet recommendation: Carbohydrate-modified.     History of Present Illness:   Kristine Burnett is an 81 y.o. female with history of diabetes mellitus, hypertension, hyperlipidemia and recent history of bowel perforation with RLQ abscess status post percutaneous drainage placement, discharged on 7 days of Cipro/Flagyl, who was admitted 03/15/14 with ongoing a chief complaint of a 24-hour history of nausea and vomiting. Patient did not have abdominal pain at home but started to develop abdominal pain in ED. She was found to have SBO based on CT scan.   Hospital Course by Problem:    Principal Problem: Small bowel obstruction  Treated with supportive care with NG tube placement, IV fluids, antiemetics and analgesia as needed.  Followed by general surgery.  SBO resolved radiographically and clinically 03/21/14.  Tolerated regular diet.  TNA has been weaned in anticipation of discharge home.  Active problems:   Bowel perforation / right lower quadrant abscess / leukocytosis / possible colitis  On recent admission. Pt underwent percutaneous drainage placement and was discharged on a seven-day course of Cipro/Flagyl.  Abcess resolved. 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 Continue percutaneous  drainage to gravity. IR re-assessed 03/24/14 with repeat dye study, fistula resolved and drain pulled.  Malnutrition of moderate degree   Received TNA through 03/18/14-03/23/14.  Hypokalemia / hypomagnesemia  Secondary to GI losses. Potassium & magnesium supplemented.   Essential hypertension  Continue home antihypertensive medications: atenolol 100 mg daily, enalapril 20 mg daily and Cardizem 120 mg PO Q 12 hours.  Diabetes mellitus, controlled with no complications  V6H in 20/9470 6.1 indicating good glycemic control.  Treated with SSI while in the hospital.  Resume Metformin 03/27/14.    Medical Consultants:    Dr. Alphonsa Overall, Surgery   Discharge Exam:   Filed Vitals:   03/24/14 1351  BP: 102/78  Pulse: 71  Temp: 98.6 F (37 C)  Resp: 16   Filed Vitals:   03/24/14 0649 03/24/14 1124 03/24/14 1126 03/24/14 1351  BP:  137/71 137/71 102/78  Pulse:  83  71  Temp:    98.6 F (37 C)  TempSrc:    Oral  Resp:    16  Height:      Weight: 67.5 kg (148 lb 13 oz)     SpO2:  100%  100%    Gen:  NAD Cardiovascular:  RRR, No M/R/G Respiratory: Lungs CTAB Gastrointestinal: Abdomen soft, NT/ND with normal active bowel sounds. Extremities: No C/E/C   The results of significant diagnostics from this hospitalization (including imaging, microbiology, ancillary and laboratory) are listed below for reference.     Procedures and Diagnostic Studies:   Ct Abdomen Pelvis W Contrast 03/15/2014 Percutaneous pigtail drainage catheter over the right lower quadrant unchanged in position as there has been complete resolution of the previously noted right  lower quadrant abscess. The previously noted suspected inflamed appendix is now normal in caliber measuring 5-6 mm. New distal small bowel obstructive process with transition point over the right lower quadrant medially below the cecum. Mild associated free fluid in the right mid to lower abdomen. Mild wall thickening and ill  definition of the wall of the entire colon with exception of the rectum which may be due to an acute colitis. Other ancillary findings as described unchanged.  Dg Abd 2 Views 03/16/2014 Persistent small bowel obstruction. COPD changes with bibasilar atelectasis.   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   Dg Abd 2 Views 03/17/2014 Stable partial small bowel obstruction since yesterday. No free intraperitoneal air.  Dg Abd 2 Views 03/19/2014: Persistent mid to distal small bowel obstruction. There has not been dramatic interval change since the previous study.   Dg Abd 2 Views 03/21/2014: 1. Nonobstructive bowel gas pattern. No free air. 2. Stable right lower quadrant drain catheter.   Ir Sinus/fist Tube Chk-non Gi 03/24/2014: There is no longer a fistula connection between the drain and colon. As a result, the drain was removed.       Labs:   Basic Metabolic Panel:  Recent Labs Lab 03/18/14 0530 03/19/14 0530 03/20/14 0530 03/21/14 0545 03/22/14 0650 03/23/14 0615 03/24/14 0430  NA 136 134* 134* 138 137 138 135  K 3.4* 3.3* 3.7 3.9 4.5 4.2 3.8  CL 104 98 101 106 106 107 104  CO2 26 28 29 27 26 28 26   GLUCOSE 97 163* 140* 145* 87 115* 122*  BUN 8 6 9 14 14 18 16   CREATININE 0.55 0.52 0.47* 0.47* 0.50 0.61 0.57  CALCIUM 8.2* 8.3* 8.3* 8.2* 8.4 8.5 8.5  MG 1.4* 1.7 1.8 1.6  --   --  1.4*  PHOS  --  2.5 2.8 3.5  --   --  4.3   GFR Estimated Creatinine Clearance: 50.8 mL/min (by C-G formula based on Cr of 0.57). Liver Function Tests:  Recent Labs Lab 03/18/14 0530 03/19/14 0530 03/20/14 0530 03/24/14 0430  AST 25 28 24 16   ALT 14 13 12 8   ALKPHOS 55 59 55 50  BILITOT 0.6 0.5 0.3 0.6  PROT 6.1 6.3 6.1 6.0  ALBUMIN 2.7* 2.8* 2.7* 2.8*   Coagulation profile  Recent Labs Lab 03/21/14 0545  INR 1.20    CBC:  Recent Labs Lab 03/18/14 0530 03/19/14 0530  03/21/14 0545 03/24/14 0430  WBC 5.1 5.6 4.7 4.4  NEUTROABS  --  2.9  --  1.4*  HGB 9.6* 10.2* 9.4* 8.8*  HCT 28.9* 29.9* 28.3* 26.4*  MCV 90.6 90.6 91.3 91.0  PLT 302 295 215 168   CBG:  Recent Labs Lab 03/23/14 2352 03/24/14 0420 03/24/14 0817 03/24/14 1158 03/24/14 1611  GLUCAP 139* 126* 142* 87 94   Lipid Profile  Recent Labs  03/24/14 0430  TRIG 100     Discharge Instructions:       Discharge Instructions    Call MD for:  persistant nausea and vomiting    Complete by:  As directed      Call MD for:  severe uncontrolled pain    Complete by:  As directed      Call MD for:  temperature >100.4    Complete by:  As directed      Diet Carb Modified    Complete by:  As directed  Discharge instructions    Complete by:  As directed   You were cared for by Dr. Jacquelynn Cree  (a hospitalist) during your hospital stay. If you have any questions about your discharge medications or the care you received while you were in the hospital after you are discharged, you can call the unit and ask to speak with the hospitalist on call if the hospitalist that took care of you is not available. Once you are discharged, your primary care physician will handle any further medical issues. Please note that NO REFILLS for any discharge medications will be authorized once you are discharged, as it is imperative that you return to your primary care physician (or establish a relationship with a primary care physician if you do not have one) for your aftercare needs so that they can reassess your need for medications and monitor your lab values.  Any outstanding tests can be reviewed by your PCP at your follow up visit.  It is also important to review any medicine changes with your PCP.  Please bring these d/c instructions with you to your next visit so your physician can review these changes with you.  If you do not have a primary care physician, you can call [phone] for a physician referral.   It is highly recommended that you obtain a PCP for hospital follow up.  Dr. Heath Gold, Dr. Jani Gravel and Dr. Thressa Sheller are possible options for a PCP.     Increase activity slowly    Complete by:  As directed             Medication List    STOP taking these medications        ciprofloxacin 500 MG tablet  Commonly known as:  CIPRO     metroNIDAZOLE 500 MG tablet  Commonly known as:  FLAGYL      TAKE these medications        acetaminophen 325 MG tablet  Commonly known as:  TYLENOL  Take 2 tablets (650 mg total) by mouth every 6 (six) hours as needed for mild pain (or Fever >/= 101).     alendronate 70 MG tablet  Commonly known as:  FOSAMAX  Take 1 tablet (70 mg total) by mouth once a week. Take with a full glass of water on an empty stomach.     ALEVE 220 MG tablet  Generic drug:  naproxen sodium  Take 220 mg by mouth 2 (two) times daily.     aspirin 325 MG tablet  Take 325 mg by mouth daily.     atenolol 100 MG tablet  Commonly known as:  TENORMIN  TAKE 1 TABLET BY MOUTH DAILY FOR BLOOD PRESSURE     diltiazem 120 MG tablet  Commonly known as:  CARDIZEM  TAKE 1 TABLET BY MOUTH TWICE DAILY FOR HIGH BLOOD PRESSURE     enalapril 20 MG tablet  Commonly known as:  VASOTEC  TAKE 1 TABLET BY MOUTH TWICE DAILY     Flaxseed Oil 1000 MG Caps  Take 1,000 mg by mouth 4 (four) times daily.     FLUZONE HIGH-DOSE 0.5 ML Susy  Generic drug:  Influenza Vac Split High-Dose     freestyle lancets  Test glucose 1 time daily     FREESTYLE LITE test strip  Generic drug:  glucose blood  1 each by Other route daily.     hydrochlorothiazide 25 MG tablet  Commonly known as:  HYDRODIURIL  TAKE 1 TABLET BY  MOUTH EVERY MORNING     MAG-OX PO  Take 500 mg by mouth 2 (two) times daily.     metFORMIN 500 MG 24 hr tablet  Commonly known as:  GLUCOPHAGE XR  Take 1 tablet with Breakfast AND Lunch and 2 tablets with Supper     MULTIVITAMIN & MINERAL PO  Take by mouth daily.      oxybutynin 5 MG tablet  Commonly known as:  DITROPAN  TAKE 1 TABLET BY MOUTH TWICE DAILY FOR BLADDER     simvastatin 40 MG tablet  Commonly known as:  ZOCOR  TAKE 1 TABLET BY MOUTH EVERY NIGHT AT BEDTIME FOR CHOLESTEROL     Vitamin B-12 2500 MCG Subl  Place 1 tablet under the tongue once a week. Thursdays     Vitamin D-3 1000 UNITS Caps  Take 4,000 Units by mouth daily.       Follow-up Information    Follow up with Alesia Richards, MD.   Specialty:  Internal Medicine   Why:  If symptoms worsen   Contact information:   [address] [place] [address] [phone]       Follow up with an alternative  PCP if  you want to make a switchl.  Make an appt to be seen ASAP. Marland Kitchen       Time coordinating discharge: 35 minutes.  Signed:  Patricia Fargo  Pager [phone] [facility] 03/24/2014, 6:17 PM

## 2014-03-24 NOTE — Care Management Note (Signed)
Medicare Important Message given?  YES (If response is "NO", the following Medicare IM given date fields will be blank) Date Medicare IM given:  03/24/2014 Medicare IM given by:  Marney Doctor

## 2014-03-24 NOTE — Progress Notes (Signed)
Progress Note   Kristine Burnett PRF:[id number] DOB: Jan 26, 1930 DOA: 03/15/2014 PCP: Alesia Richards, MD   Brief Narrative:   Kristine Burnett is an 81 y.o. female with history of diabetes mellitus, hypertension, hyperlipidemia and recent history of bowel perforation with  RLQ abscess status post percutaneous drainage placement, discharged on 7 days of Cipro/Flagyl, who was admitted 03/15/14 with ongoing a chief complaint of a 24-hour history of nausea and vomiting. Patient did not have abdominal pain at home but started to develop abdominal pain in ED. She was found to have SBO based on CT scan. She was hemodynamically stable in ED. NG tube was placed. Her bowel obstruction is now resolved after conservative therapy with our rest, NG tube gastric decompression, and nutritional supplementation with TNA.  Interventional radiology evaluated  the patient to see if current percutaneous abscess drain could be removed. 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 Per IR, would recommend to continue to NOT flush the percutaneous drainage catheter to encourage fistulous tract healing. She is scheduled for a repeat dye study 03/24/14 for further recommendations per IR.  Assessment/Plan:    Principal Problem: Small bowel obstruction  Treated with supportive care with NG tube placement, IV fluids, antiemetics and analgesia as needed.  Ambulating multiple times a day in hallway.  Being followed by general surgery.  SBO resolved radiographically and clinically 03/21/14.  Tolerated regular diet.  TNA has been weaned in anticipation of discharge home.  Active problems:   Bowel perforation / right lower quadrant abscess / leukocytosis / possible colitis  On recent admission. Pt underwent percutaneous drainage placement and was discharged on a seven-day course of Cipro/Flagyl.  Abcess resolved. However, a persistent  fistulous connection seen of the RLQ and adjacent cecum. Continue percutaneous drainage to gravity.  IR to re-assess 03/24/14 with repeat dye study.  Malnutrition of moderate degree    Received TNA through 03/18/14-03/23/14.  Hypokalemia / hypomagnesemia  Secondary to GI losses. Potassium supplemented.   We'll replete magnesium with magnesium oxide orally.  Essential hypertension  Continue home antihypertensive medications: atenolol 100 mg daily, enalapril 20 mg daily and Cardizem 120 mg PO Q 12 hours.  Diabetes mellitus, controlled with no complications  D3T in 70/1779 6.1 indicating good glycemic control.  Continue to hold metformin and use SSI.  CBG's in past 24 hours: 87-142.  DVT prophylaxis:   SCD's for DVT prophylaxis  Code Status: Full.  Family Communication: Daughter updated at the bedside 03/23/14, no family present today.  Disposition Plan: Home in the next 24 hours if stable.     IV Access:    PICC placed 03/18/14.   Procedures and diagnostic studies:   Ct Abdomen Pelvis W Contrast 03/15/2014 Percutaneous pigtail drainage catheter over the right lower quadrant unchanged in position as there has been complete resolution of the previously noted right lower quadrant abscess. The previously noted suspected inflamed appendix is now normal in caliber measuring 5-6 mm. New distal small bowel obstructive process with transition point over the right lower quadrant medially below the cecum. Mild associated free fluid in the right mid to lower abdomen. Mild wall thickening and ill definition of the wall of the entire colon with exception of the rectum which may be due to an acute colitis. Other ancillary findings as described unchanged.  Dg Abd 2 Views 03/16/2014 Persistent small bowel obstruction. COPD changes with bibasilar atelectasis.   Fluoroscopy 03/17/2014 - Persistent fistulous  connection of the right lower quadrant percutaneous drainage catheter and the  adjacent cecum. PLAN: Would recommend to continue to NOT flush the percutaneous drainage catheter to encourage fistulous tract healing. If the patient is discharged and ultimately not deemed a surgical candidate, the patient may attend the drain clinic at the [facility] on 04/01/2014 as clinically indicated.  Dg Abd 2 Views 03/17/2014 Stable partial small bowel obstruction since yesterday. No free intraperitoneal air.  Dg Abd 2 Views 03/19/2014: Persistent mid to distal small bowel obstruction. There has not been dramatic interval change since the previous study.     Dg Abd 2 Views 03/21/2014: 1. Nonobstructive bowel gas pattern.  No free air. 2. Stable right lower quadrant drain catheter.     Medical Consultants:    Dr. Alphonsa Overall, Surgery  Anti-Infectives:    Cipro 03/15/14--->03/18/14  Flagyl 03/15/14--->03/18/14  Subjective:   Kristine Burnett feels well and is anxious for discharge. Tolerating a regular diet. Continues to report flatus and bowel movements.  Objective:    Filed Vitals:   03/24/14 0423 03/24/14 0649 03/24/14 1124 03/24/14 1126  BP: 135/58  137/71 137/71  Pulse: 75  83   Temp: 98.5 F (36.9 C)     TempSrc: Oral     Resp: 16     Height:      Weight:  67.5 kg (148 lb 13 oz)    SpO2: 100%  100%     Intake/Output Summary (Last 24 hours) at 03/24/14 1445 Last data filed at 03/24/14 1412  Gross per 24 hour  Intake   1360 ml  Output   3151 ml  Net  -1791 ml    Exam: Gen:  NAD Cardiovascular:  RRR, No M/R/G Respiratory:  Lungs CTAB Gastrointestinal:  Abdomen soft, NT/ND, + BS Extremities:  No C/E/C   Data Reviewed:   Basic Metabolic Panel:  Recent Labs Lab 03/18/14 0530 03/19/14 0530 03/20/14 0530 03/21/14 0545 03/22/14 0650 03/23/14 0615 03/24/14 0430  NA 136 134* 134* 138 137 138 135  K 3.4* 3.3* 3.7 3.9 4.5 4.2 3.8  CL 104 98 101 106 106 107 104  CO2 26 28 29 27 26 28 26   GLUCOSE 97 163* 140* 145*  87 115* 122*  BUN 8 6 9 14 14 18 16   CREATININE 0.55 0.52 0.47* 0.47* 0.50 0.61 0.57  CALCIUM 8.2* 8.3* 8.3* 8.2* 8.4 8.5 8.5  MG 1.4* 1.7 1.8 1.6  --   --  1.4*  PHOS  --  2.5 2.8 3.5  --   --  4.3   GFR Estimated Creatinine Clearance: 50.8 mL/min (by C-G formula based on Cr of 0.57). Liver Function Tests:  Recent Labs Lab 03/18/14 0530 03/19/14 0530 03/20/14 0530 03/24/14 0430  AST 25 28 24 16   ALT 14 13 12 8   ALKPHOS 55 59 55 50  BILITOT 0.6 0.5 0.3 0.6  PROT 6.1 6.3 6.1 6.0  ALBUMIN 2.7* 2.8* 2.7* 2.8*   Coagulation profile  Recent Labs Lab 03/21/14 0545  INR 1.20    CBC:  Recent Labs Lab 03/18/14 0530 03/19/14 0530 03/21/14 0545 03/24/14 0430  WBC 5.1 5.6 4.7 4.4  NEUTROABS  --  2.9  --  1.4*  HGB 9.6* 10.2* 9.4* 8.8*  HCT 28.9* 29.9* 28.3* 26.4*  MCV 90.6 90.6 91.3 91.0  PLT 302 295 215 168   CBG:  Recent Labs Lab 03/23/14 1945 03/23/14 2352 03/24/14 0420 03/24/14 0817 03/24/14 1158  GLUCAP 105* 139*  126* 142* 87    Medications:   . atenolol  100 mg Oral Daily  . diltiazem  120 mg Oral Q12H  . enalapril  20 mg Oral Daily  . enoxaparin (LOVENOX) injection  40 mg Subcutaneous Q24H  . insulin aspart  0-15 Units Subcutaneous TID WC  . sodium chloride  10-40 mL Intracatheter Q12H   Continuous Infusions: . 0.9 % NaCl with KCl 40 mEq / L 30 mL/hr (03/24/14 1412)    Time spent: 25 minutes.   LOS: 9 days   [facility] Pager [phone]. If unable to reach me by pager, please call my cell phone at [phone].  *Please refer to [email or web address], password [email or web address] to get updated schedule on who will round on this patient, as hospitalists switch teams weekly. If 7PM-7AM, please contact night-coverage at [email or web address], password [email or web address] for any overnight needs.  03/24/2014, 2:45 PM

## 2014-03-28 ENCOUNTER — Telehealth: Payer: Self-pay | Admitting: *Deleted

## 2014-03-28 NOTE — Telephone Encounter (Signed)
[place] called to inform Dr Melford Aase that the patient has stopped her Metformin due to diarrhea and weight loss and glucose level is 80-90.  Patient is taking HCTZ 25 mg 1/2 tab also.  Per Dr Melford Aase, the med changes are OK and can take HCTZ only when she has edema.  Also, Dr Melford Aase is aware of the level 1 interaction between Simvastatin and Cardiozem.  Left message to inform nurse.

## 2014-04-04 ENCOUNTER — Ambulatory Visit: Payer: Self-pay | Admitting: Internal Medicine

## 2014-07-07 ENCOUNTER — Other Ambulatory Visit (HOSPITAL_COMMUNITY): Payer: Self-pay | Admitting: Internal Medicine

## 2014-07-07 DIAGNOSIS — Z1231 Encounter for screening mammogram for malignant neoplasm of breast: Secondary | ICD-10-CM

## 2014-07-14 ENCOUNTER — Ambulatory Visit (HOSPITAL_COMMUNITY)
Admission: RE | Admit: 2014-07-14 | Discharge: 2014-07-14 | Disposition: A | Discharge status: Home / Self Care | Payer: Medicare HMO | Source: Ambulatory Visit | Attending: Internal Medicine | Admitting: Internal Medicine

## 2014-07-14 DIAGNOSIS — Z1231 Encounter for screening mammogram for malignant neoplasm of breast: Secondary | ICD-10-CM | POA: Diagnosis present

## 2014-09-17 ENCOUNTER — Other Ambulatory Visit: Payer: Self-pay | Admitting: Internal Medicine

## 2014-09-25 ENCOUNTER — Encounter: Payer: Self-pay | Admitting: Internal Medicine

## 2014-11-15 ENCOUNTER — Inpatient Hospital Stay (HOSPITAL_COMMUNITY)
Admission: EM | Admit: 2014-11-15 | Discharge: 2014-11-18 | DRG: 330 | Disposition: A | Discharge status: Home Health | Payer: Medicare HMO | Attending: Surgery | Admitting: Surgery

## 2014-11-15 ENCOUNTER — Encounter (HOSPITAL_COMMUNITY): Payer: Self-pay | Admitting: Emergency Medicine

## 2014-11-15 ENCOUNTER — Emergency Department (HOSPITAL_COMMUNITY): Payer: Medicare HMO

## 2014-11-15 ENCOUNTER — Inpatient Hospital Stay (HOSPITAL_COMMUNITY): Payer: Medicare HMO | Admitting: Anesthesiology

## 2014-11-15 ENCOUNTER — Encounter (HOSPITAL_COMMUNITY): Admission: EM | Disposition: A | Discharge status: Home Health | Payer: Self-pay | Source: Home / Self Care

## 2014-11-15 DIAGNOSIS — E440 Moderate protein-calorie malnutrition: Secondary | ICD-10-CM | POA: Diagnosis present

## 2014-11-15 DIAGNOSIS — Z823 Family history of stroke: Secondary | ICD-10-CM

## 2014-11-15 DIAGNOSIS — R112 Nausea with vomiting, unspecified: Secondary | ICD-10-CM | POA: Diagnosis present

## 2014-11-15 DIAGNOSIS — Z8249 Family history of ischemic heart disease and other diseases of the circulatory system: Secondary | ICD-10-CM | POA: Diagnosis not present

## 2014-11-15 DIAGNOSIS — K352 Acute appendicitis with generalized peritonitis: Principal | ICD-10-CM | POA: Diagnosis present

## 2014-11-15 DIAGNOSIS — R339 Retention of urine, unspecified: Secondary | ICD-10-CM | POA: Diagnosis not present

## 2014-11-15 DIAGNOSIS — Z79899 Other long term (current) drug therapy: Secondary | ICD-10-CM | POA: Diagnosis not present

## 2014-11-15 DIAGNOSIS — E1122 Type 2 diabetes mellitus with diabetic chronic kidney disease: Secondary | ICD-10-CM | POA: Diagnosis present

## 2014-11-15 DIAGNOSIS — E119 Type 2 diabetes mellitus without complications: Secondary | ICD-10-CM

## 2014-11-15 DIAGNOSIS — Z803 Family history of malignant neoplasm of breast: Secondary | ICD-10-CM | POA: Diagnosis not present

## 2014-11-15 DIAGNOSIS — Z7982 Long term (current) use of aspirin: Secondary | ICD-10-CM

## 2014-11-15 DIAGNOSIS — I129 Hypertensive chronic kidney disease with stage 1 through stage 4 chronic kidney disease, or unspecified chronic kidney disease: Secondary | ICD-10-CM | POA: Diagnosis not present

## 2014-11-15 DIAGNOSIS — N182 Chronic kidney disease, stage 2 (mild): Secondary | ICD-10-CM | POA: Diagnosis present

## 2014-11-15 DIAGNOSIS — E559 Vitamin D deficiency, unspecified: Secondary | ICD-10-CM | POA: Diagnosis present

## 2014-11-15 DIAGNOSIS — E876 Hypokalemia: Secondary | ICD-10-CM | POA: Diagnosis not present

## 2014-11-15 DIAGNOSIS — K37 Unspecified appendicitis: Secondary | ICD-10-CM

## 2014-11-15 DIAGNOSIS — Z87442 Personal history of urinary calculi: Secondary | ICD-10-CM

## 2014-11-15 DIAGNOSIS — Z87891 Personal history of nicotine dependence: Secondary | ICD-10-CM | POA: Diagnosis not present

## 2014-11-15 DIAGNOSIS — Z888 Allergy status to other drugs, medicaments and biological substances status: Secondary | ICD-10-CM

## 2014-11-15 DIAGNOSIS — J45909 Unspecified asthma, uncomplicated: Secondary | ICD-10-CM | POA: Diagnosis present

## 2014-11-15 DIAGNOSIS — R1031 Right lower quadrant pain: Secondary | ICD-10-CM | POA: Diagnosis present

## 2014-11-15 DIAGNOSIS — E785 Hyperlipidemia, unspecified: Secondary | ICD-10-CM | POA: Diagnosis present

## 2014-11-15 DIAGNOSIS — Z880 Allergy status to penicillin: Secondary | ICD-10-CM

## 2014-11-15 DIAGNOSIS — R338 Other retention of urine: Secondary | ICD-10-CM

## 2014-11-15 DIAGNOSIS — K36 Other appendicitis: Secondary | ICD-10-CM

## 2014-11-15 HISTORY — DX: Peritoneal abscess: K65.1

## 2014-11-15 HISTORY — DX: Unspecified intestinal obstruction, unspecified as to partial versus complete obstruction: K56.609

## 2014-11-15 HISTORY — PX: LAPAROSCOPIC APPENDECTOMY: SHX408

## 2014-11-15 LAB — URINALYSIS, ROUTINE W REFLEX MICROSCOPIC
Bilirubin Urine: NEGATIVE
Glucose, UA: NEGATIVE mg/dL
Hgb urine dipstick: NEGATIVE
Ketones, ur: NEGATIVE mg/dL
Leukocytes, UA: NEGATIVE
NITRITE: NEGATIVE
PH: 8 (ref 5.0–8.0)
Protein, ur: 30 mg/dL — AB
SPECIFIC GRAVITY, URINE: 1.019 (ref 1.005–1.030)
Urobilinogen, UA: 0.2 mg/dL (ref 0.0–1.0)

## 2014-11-15 LAB — CBC
HEMATOCRIT: 41.7 % (ref 36.0–46.0)
Hemoglobin: 14 g/dL (ref 12.0–15.0)
MCH: 31.7 pg (ref 26.0–34.0)
MCHC: 33.6 g/dL (ref 30.0–36.0)
MCV: 94.3 fL (ref 78.0–100.0)
Platelets: 154 10*3/uL (ref 150–400)
RBC: 4.42 MIL/uL (ref 3.87–5.11)
RDW: 13.9 % (ref 11.5–15.5)
WBC: 2.5 10*3/uL — AB (ref 4.0–10.5)

## 2014-11-15 LAB — COMPREHENSIVE METABOLIC PANEL
ALBUMIN: 3.6 g/dL (ref 3.5–5.0)
ALT: 16 U/L (ref 14–54)
AST: 34 U/L (ref 15–41)
Alkaline Phosphatase: 37 U/L — ABNORMAL LOW (ref 38–126)
Anion gap: 8 (ref 5–15)
BUN: 25 mg/dL — AB (ref 6–20)
CHLORIDE: 100 mmol/L — AB (ref 101–111)
CO2: 28 mmol/L (ref 22–32)
Calcium: 9.7 mg/dL (ref 8.9–10.3)
Creatinine, Ser: 0.81 mg/dL (ref 0.44–1.00)
GFR calc Af Amer: >60 mL/min (ref >60)
GFR calc non Af Amer: >60 mL/min (ref >60)
GLUCOSE: 131 mg/dL — AB (ref 65–99)
POTASSIUM: 3.4 mmol/L — AB (ref 3.5–5.1)
Sodium: 136 mmol/L (ref 135–145)
Total Bilirubin: 1 mg/dL (ref 0.3–1.2)
Total Protein: 6.9 g/dL (ref 6.5–8.1)

## 2014-11-15 LAB — GLUCOSE, CAPILLARY
GLUCOSE-CAPILLARY: 144 mg/dL — AB (ref 65–99)
Glucose-Capillary: 154 mg/dL — ABNORMAL HIGH (ref 65–99)
Glucose-Capillary: 193 mg/dL — ABNORMAL HIGH (ref 65–99)

## 2014-11-15 LAB — SURGICAL PCR SCREEN
MRSA, PCR: NEGATIVE
STAPHYLOCOCCUS AUREUS: POSITIVE — AB

## 2014-11-15 LAB — URINE MICROSCOPIC-ADD ON

## 2014-11-15 LAB — LIPASE, BLOOD: LIPASE: 11 U/L — AB (ref 22–51)

## 2014-11-15 SURGERY — APPENDECTOMY, LAPAROSCOPIC
Anesthesia: General | Site: Abdomen

## 2014-11-15 SURGERY — Surgical Case
Anesthesia: *Unknown

## 2014-11-15 MED ORDER — METOPROLOL TARTRATE 12.5 MG HALF TABLET
12.5000 mg | ORAL_TABLET | Freq: Two times a day (BID) | ORAL | Status: DC | PRN
  Filled 2014-11-15: qty 1

## 2014-11-15 MED ORDER — FENTANYL CITRATE (PF) 100 MCG/2ML IJ SOLN
INTRAMUSCULAR | Status: DC | PRN
  Administered 2014-11-15 (×5): 50 ug via INTRAVENOUS

## 2014-11-15 MED ORDER — ACETAMINOPHEN 325 MG PO TABS: 650.0000 mg | ORAL_TABLET | Freq: Four times a day (QID) | ORAL | Status: DC | PRN

## 2014-11-15 MED ORDER — ACETAMINOPHEN 650 MG RE SUPP: 650.0000 mg | Freq: Four times a day (QID) | RECTAL | Status: DC | PRN

## 2014-11-15 MED ORDER — ROCURONIUM BROMIDE 100 MG/10ML IV SOLN
INTRAVENOUS | Status: AC
  Filled 2014-11-15: qty 1

## 2014-11-15 MED ORDER — PROPOFOL 10 MG/ML IV BOLUS
INTRAVENOUS | Status: AC
  Filled 2014-11-15: qty 20

## 2014-11-15 MED ORDER — LACTATED RINGERS IV BOLUS (SEPSIS): 1000.0000 mL | Freq: Three times a day (TID) | INTRAVENOUS | Status: AC | PRN

## 2014-11-15 MED ORDER — LIDOCAINE HCL (CARDIAC) 20 MG/ML IV SOLN
INTRAVENOUS | Status: AC
  Filled 2014-11-15: qty 5

## 2014-11-15 MED ORDER — CHLORHEXIDINE GLUCONATE 4 % EX LIQD: 1.0000 "application " | Freq: Once | CUTANEOUS | Status: DC

## 2014-11-15 MED ORDER — ONDANSETRON HCL 4 MG/2ML IJ SOLN
INTRAMUSCULAR | Status: DC | PRN
  Administered 2014-11-15: 4 mg via INTRAVENOUS

## 2014-11-15 MED ORDER — ONDANSETRON 4 MG PO TBDP: 4.0000 mg | ORAL_TABLET | Freq: Once | ORAL | Status: DC | PRN

## 2014-11-15 MED ORDER — IOHEXOL 300 MG/ML  SOLN
50.0000 mL | Freq: Once | INTRAMUSCULAR | Status: AC | PRN
  Administered 2014-11-15: 50 mL via ORAL

## 2014-11-15 MED ORDER — CIPROFLOXACIN IN D5W 400 MG/200ML IV SOLN
400.0000 mg | Freq: Two times a day (BID) | INTRAVENOUS | Status: DC
  Administered 2014-11-15 – 2014-11-18 (×7): 400 mg via INTRAVENOUS
  Filled 2014-11-15 (×7): qty 200

## 2014-11-15 MED ORDER — INFLUENZA VAC SPLIT QUAD 0.5 ML IM SUSY
0.5000 mL | PREFILLED_SYRINGE | INTRAMUSCULAR | Status: AC
  Administered 2014-11-16: 0.5 mL via INTRAMUSCULAR
  Filled 2014-11-15 (×2): qty 0.5

## 2014-11-15 MED ORDER — SODIUM CHLORIDE 0.9 % IJ SOLN: 3.0000 mL | INTRAMUSCULAR | Status: DC | PRN

## 2014-11-15 MED ORDER — SODIUM CHLORIDE 0.9 % IJ SOLN
3.0000 mL | Freq: Two times a day (BID) | INTRAMUSCULAR | Status: DC
  Administered 2014-11-15 – 2014-11-18 (×4): 3 mL via INTRAVENOUS

## 2014-11-15 MED ORDER — MENTHOL 3 MG MT LOZG
1.0000 | LOZENGE | OROMUCOSAL | Status: DC | PRN
Start: 2014-11-15 — End: 2014-11-18
  Filled 2014-11-15 (×2): qty 9

## 2014-11-15 MED ORDER — MEPERIDINE HCL 50 MG/ML IJ SOLN: 6.2500 mg | INTRAMUSCULAR | Status: DC | PRN

## 2014-11-15 MED ORDER — HYDROCODONE-ACETAMINOPHEN 5-325 MG PO TABS: 1.0000 | ORAL_TABLET | Freq: Four times a day (QID) | ORAL | Status: DC | PRN

## 2014-11-15 MED ORDER — ROCURONIUM BROMIDE 100 MG/10ML IV SOLN
INTRAVENOUS | Status: DC | PRN
  Administered 2014-11-15: 5 mg via INTRAVENOUS
  Administered 2014-11-15: 25 mg via INTRAVENOUS
  Administered 2014-11-15 (×2): 5 mg via INTRAVENOUS

## 2014-11-15 MED ORDER — ONDANSETRON HCL 4 MG/2ML IJ SOLN
4.0000 mg | Freq: Four times a day (QID) | INTRAMUSCULAR | Status: DC | PRN
  Administered 2014-11-15: 4 mg via INTRAVENOUS
  Filled 2014-11-15: qty 2

## 2014-11-15 MED ORDER — HEPARIN SODIUM (PORCINE) 5000 UNIT/ML IJ SOLN
5000.0000 [IU] | Freq: Three times a day (TID) | INTRAMUSCULAR | Status: DC
  Administered 2014-11-15 – 2014-11-18 (×8): 5000 [IU] via SUBCUTANEOUS
  Filled 2014-11-15 (×11): qty 1

## 2014-11-15 MED ORDER — SACCHAROMYCES BOULARDII 250 MG PO CAPS
250.0000 mg | ORAL_CAPSULE | Freq: Two times a day (BID) | ORAL | Status: DC
  Administered 2014-11-15 – 2014-11-18 (×7): 250 mg via ORAL
  Filled 2014-11-15 (×8): qty 1

## 2014-11-15 MED ORDER — METOPROLOL TARTRATE 1 MG/ML IV SOLN
5.0000 mg | Freq: Four times a day (QID) | INTRAVENOUS | Status: DC | PRN
  Filled 2014-11-15: qty 5

## 2014-11-15 MED ORDER — GLYCOPYRROLATE 0.2 MG/ML IJ SOLN
INTRAMUSCULAR | Status: DC | PRN
  Administered 2014-11-15: 0.6 mg via INTRAVENOUS

## 2014-11-15 MED ORDER — LACTATED RINGERS IV SOLN: INTRAVENOUS | Status: DC

## 2014-11-15 MED ORDER — MORPHINE SULFATE (PF) 4 MG/ML IV SOLN
4.0000 mg | Freq: Once | INTRAVENOUS | Status: AC
  Administered 2014-11-15: 4 mg via INTRAVENOUS
  Filled 2014-11-15: qty 1

## 2014-11-15 MED ORDER — HYDROMORPHONE HCL 1 MG/ML IJ SOLN
0.5000 mg | INTRAMUSCULAR | Status: DC | PRN
Start: 2014-11-15 — End: 2014-11-18

## 2014-11-15 MED ORDER — LIDOCAINE HCL (CARDIAC) 20 MG/ML IV SOLN
INTRAVENOUS | Status: DC | PRN
  Administered 2014-11-15: 50 mg via INTRAVENOUS

## 2014-11-15 MED ORDER — SODIUM CHLORIDE 0.9 % IV SOLN
INTRAVENOUS | Status: DC | PRN
  Administered 2014-11-15: 1000 mL via INTRAPERITONEAL

## 2014-11-15 MED ORDER — LACTATED RINGERS IR SOLN
Status: DC | PRN
  Administered 2014-11-15: 3000 mL

## 2014-11-15 MED ORDER — HYDROCODONE-ACETAMINOPHEN 5-325 MG PO TABS
1.0000 | ORAL_TABLET | Freq: Four times a day (QID) | ORAL | Status: DC | PRN
Start: 2014-11-15 — End: 2014-11-18

## 2014-11-15 MED ORDER — ENALAPRIL MALEATE 10 MG PO TABS
10.0000 mg | ORAL_TABLET | Freq: Every day | ORAL | Status: DC
  Administered 2014-11-16 – 2014-11-18 (×3): 10 mg via ORAL
  Filled 2014-11-15 (×3): qty 1

## 2014-11-15 MED ORDER — CIPROFLOXACIN IN D5W 400 MG/200ML IV SOLN
INTRAVENOUS | Status: AC
  Filled 2014-11-15: qty 200

## 2014-11-15 MED ORDER — MAGIC MOUTHWASH
15.0000 mL | Freq: Four times a day (QID) | ORAL | Status: DC | PRN
  Filled 2014-11-15: qty 15

## 2014-11-15 MED ORDER — OXYBUTYNIN CHLORIDE 5 MG PO TABS
5.0000 mg | ORAL_TABLET | Freq: Two times a day (BID) | ORAL | Status: DC
  Administered 2014-11-15 – 2014-11-18 (×7): 5 mg via ORAL
  Filled 2014-11-15 (×8): qty 1

## 2014-11-15 MED ORDER — SODIUM CHLORIDE 0.9 % IV SOLN
INTRAVENOUS | Status: DC
  Filled 2014-11-15: qty 6

## 2014-11-15 MED ORDER — INSULIN ASPART 100 UNIT/ML ~~LOC~~ SOLN: 0.0000 [IU] | Freq: Every day | SUBCUTANEOUS | Status: DC

## 2014-11-15 MED ORDER — IOHEXOL 300 MG/ML  SOLN
100.0000 mL | Freq: Once | INTRAMUSCULAR | Status: AC | PRN
  Administered 2014-11-15: 100 mL via INTRAVENOUS

## 2014-11-15 MED ORDER — PROMETHAZINE HCL 25 MG/ML IJ SOLN
6.2500 mg | INTRAMUSCULAR | Status: DC | PRN
  Filled 2014-11-15: qty 1

## 2014-11-15 MED ORDER — NEOSTIGMINE METHYLSULFATE 10 MG/10ML IV SOLN
INTRAVENOUS | Status: DC | PRN
  Administered 2014-11-15: 4 mg via INTRAVENOUS

## 2014-11-15 MED ORDER — ONDANSETRON HCL 4 MG/2ML IJ SOLN
4.0000 mg | Freq: Once | INTRAMUSCULAR | Status: AC
Start: 2014-11-15 — End: 2014-11-15
  Administered 2014-11-15: 4 mg via INTRAVENOUS
  Filled 2014-11-15: qty 2

## 2014-11-15 MED ORDER — SODIUM CHLORIDE 0.9 % IV SOLN: 250.0000 mL | INTRAVENOUS | Status: DC | PRN

## 2014-11-15 MED ORDER — LACTATED RINGERS IV SOLN
INTRAVENOUS | Status: DC | PRN
  Administered 2014-11-15 (×2): via INTRAVENOUS

## 2014-11-15 MED ORDER — FENTANYL CITRATE (PF) 100 MCG/2ML IJ SOLN: 25.0000 ug | INTRAMUSCULAR | Status: DC | PRN

## 2014-11-15 MED ORDER — PHENOL 1.4 % MT LIQD
2.0000 | OROMUCOSAL | Status: DC | PRN
  Filled 2014-11-15: qty 177

## 2014-11-15 MED ORDER — FENTANYL CITRATE (PF) 250 MCG/5ML IJ SOLN
INTRAMUSCULAR | Status: AC
  Filled 2014-11-15: qty 25

## 2014-11-15 MED ORDER — SODIUM CHLORIDE 0.9 % IV SOLN
8.0000 mg | Freq: Four times a day (QID) | INTRAVENOUS | Status: DC | PRN
  Filled 2014-11-15: qty 4

## 2014-11-15 MED ORDER — DIPHENHYDRAMINE HCL 50 MG/ML IJ SOLN: 12.5000 mg | Freq: Four times a day (QID) | INTRAMUSCULAR | Status: DC | PRN

## 2014-11-15 MED ORDER — BISACODYL 10 MG RE SUPP: 10.0000 mg | Freq: Two times a day (BID) | RECTAL | Status: DC | PRN

## 2014-11-15 MED ORDER — METRONIDAZOLE IN NACL 5-0.79 MG/ML-% IV SOLN
500.0000 mg | Freq: Three times a day (TID) | INTRAVENOUS | Status: DC
  Administered 2014-11-15 – 2014-11-18 (×10): 500 mg via INTRAVENOUS
  Filled 2014-11-15 (×13): qty 100

## 2014-11-15 MED ORDER — INSULIN ASPART 100 UNIT/ML ~~LOC~~ SOLN
0.0000 [IU] | Freq: Three times a day (TID) | SUBCUTANEOUS | Status: DC
  Administered 2014-11-16: 2 [IU] via SUBCUTANEOUS
  Administered 2014-11-17: 3 [IU] via SUBCUTANEOUS

## 2014-11-15 MED ORDER — LIP MEDEX EX OINT
1.0000 "application " | TOPICAL_OINTMENT | Freq: Two times a day (BID) | CUTANEOUS | Status: DC
  Administered 2014-11-15 – 2014-11-18 (×6): 1 via TOPICAL
  Filled 2014-11-15 (×2): qty 7

## 2014-11-15 MED ORDER — BUPIVACAINE-EPINEPHRINE 0.25% -1:200000 IJ SOLN
INTRAMUSCULAR | Status: AC
  Filled 2014-11-15: qty 1

## 2014-11-15 MED ORDER — ONDANSETRON HCL 4 MG/2ML IJ SOLN
INTRAMUSCULAR | Status: AC
  Filled 2014-11-15: qty 2

## 2014-11-15 MED ORDER — BUPIVACAINE-EPINEPHRINE 0.25% -1:200000 IJ SOLN
INTRAMUSCULAR | Status: DC | PRN
  Administered 2014-11-15: 50 mL

## 2014-11-15 MED ORDER — ALUM & MAG HYDROXIDE-SIMETH 200-200-20 MG/5ML PO SUSP: 30.0000 mL | Freq: Four times a day (QID) | ORAL | Status: DC | PRN

## 2014-11-15 MED ORDER — VITAMIN C 500 MG PO TABS
500.0000 mg | ORAL_TABLET | Freq: Two times a day (BID) | ORAL | Status: DC
  Administered 2014-11-15 – 2014-11-18 (×7): 500 mg via ORAL
  Filled 2014-11-15 (×8): qty 1

## 2014-11-15 MED ORDER — PROMETHAZINE HCL 25 MG/ML IJ SOLN: 6.2500 mg | INTRAMUSCULAR | Status: DC | PRN

## 2014-11-15 MED ORDER — 0.9 % SODIUM CHLORIDE (POUR BTL) OPTIME
TOPICAL | Status: DC | PRN
  Administered 2014-11-15: 1000 mL

## 2014-11-15 MED ORDER — PROPOFOL 10 MG/ML IV BOLUS
INTRAVENOUS | Status: DC | PRN
  Administered 2014-11-15: 160 mg via INTRAVENOUS

## 2014-11-15 MED ORDER — FENOFIBRATE 160 MG PO TABS
160.0000 mg | ORAL_TABLET | Freq: Every day | ORAL | Status: DC
  Administered 2014-11-16 – 2014-11-18 (×3): 160 mg via ORAL
  Filled 2014-11-15 (×3): qty 1

## 2014-11-15 MED ORDER — SUCCINYLCHOLINE CHLORIDE 20 MG/ML IJ SOLN
INTRAMUSCULAR | Status: DC | PRN
  Administered 2014-11-15: 100 mg via INTRAVENOUS

## 2014-11-15 SURGICAL SUPPLY — 48 items
APPLIER CLIP 5 13 M/L LIGAMAX5 (MISCELLANEOUS)
APPLIER CLIP ROT 10 11.4 M/L (STAPLE)
APR CLP MED LRG 11.4X10 (STAPLE)
APR CLP MED LRG 5 ANG JAW (MISCELLANEOUS)
BAG SPEC RTRVL LRG 6X4 10 (ENDOMECHANICALS) ×1
CLIP APPLIE 5 13 M/L LIGAMAX5 (MISCELLANEOUS) IMPLANT
CLIP APPLIE ROT 10 11.4 M/L (STAPLE) IMPLANT
COVER SURGICAL LIGHT HANDLE (MISCELLANEOUS) ×1 IMPLANT
CUTTER FLEX LINEAR 45M (STAPLE) IMPLANT
DECANTER SPIKE VIAL GLASS SM (MISCELLANEOUS) ×3 IMPLANT
DEVICE TROCAR PUNCTURE CLOSURE (ENDOMECHANICALS) IMPLANT
DRAPE LAPAROSCOPIC ABDOMINAL (DRAPES) ×3 IMPLANT
DRAPE WARM FLUID 44X44 (DRAPE) ×3 IMPLANT
DRSG TEGADERM 2-3/8X2-3/4 SM (GAUZE/BANDAGES/DRESSINGS) ×4 IMPLANT
DRSG TEGADERM 4X4.75 (GAUZE/BANDAGES/DRESSINGS) ×3 IMPLANT
ELECT REM PT RETURN 9FT ADLT (ELECTROSURGICAL) ×3
ELECTRODE REM PT RTRN 9FT ADLT (ELECTROSURGICAL) ×1 IMPLANT
ENDOLOOP SUT PDS II  0 18 (SUTURE)
ENDOLOOP SUT PDS II 0 18 (SUTURE) IMPLANT
GAUZE SPONGE 2X2 8PLY STRL LF (GAUZE/BANDAGES/DRESSINGS) ×1 IMPLANT
GLOVE ECLIPSE 8.0 STRL XLNG CF (GLOVE) ×3 IMPLANT
GLOVE INDICATOR 8.0 STRL GRN (GLOVE) ×3 IMPLANT
GOWN STRL REUS W/TWL XL LVL3 (GOWN DISPOSABLE) ×9 IMPLANT
KIT BASIN OR (CUSTOM PROCEDURE TRAY) ×3 IMPLANT
PEN SKIN MARKING BROAD (MISCELLANEOUS) ×3 IMPLANT
POUCH SPECIMEN RETRIEVAL 10MM (ENDOMECHANICALS) ×3 IMPLANT
RELOAD 45 VASCULAR/THIN (ENDOMECHANICALS) IMPLANT
RELOAD STAPLE 45 2.5 WHT GRN (ENDOMECHANICALS) IMPLANT
RELOAD STAPLE 45 3.5 BLU ETS (ENDOMECHANICALS) IMPLANT
RELOAD STAPLE 60 3.6 BLU REG (STAPLE) IMPLANT
RELOAD STAPLE TA45 3.5 REG BLU (ENDOMECHANICALS) IMPLANT
RELOAD STAPLER BLUE 60MM (STAPLE) ×1 IMPLANT
SCISSORS LAP 5X35 DISP (ENDOMECHANICALS) ×3 IMPLANT
SET IRRIG TUBING LAPAROSCOPIC (IRRIGATION / IRRIGATOR) ×3 IMPLANT
SHEARS HARMONIC ACE PLUS 36CM (ENDOMECHANICALS) ×2 IMPLANT
SLEEVE XCEL OPT CAN 5 100 (ENDOMECHANICALS) ×3 IMPLANT
SPONGE GAUZE 2X2 STER 10/PKG (GAUZE/BANDAGES/DRESSINGS) ×2
STAPLE ECHEON FLEX 60 POW ENDO (STAPLE) ×2 IMPLANT
STAPLER RELOAD BLUE 60MM (STAPLE) ×3
SUT MNCRL AB 4-0 PS2 18 (SUTURE) ×3 IMPLANT
SUT SILK 2 0 SH (SUTURE) IMPLANT
SUT VICRYL 0 UR6 27IN ABS (SUTURE) ×4 IMPLANT
TOWEL OR 17X26 10 PK STRL BLUE (TOWEL DISPOSABLE) ×3 IMPLANT
TRAY FOLEY W/METER SILVER 14FR (SET/KITS/TRAYS/PACK) ×3 IMPLANT
TRAY FOLEY W/METER SILVER 16FR (SET/KITS/TRAYS/PACK) ×1 IMPLANT
TRAY LAPAROSCOPIC (CUSTOM PROCEDURE TRAY) ×3 IMPLANT
TROCAR BLADELESS OPT 5 100 (ENDOMECHANICALS) ×3 IMPLANT
TROCAR XCEL 12X100 BLDLESS (ENDOMECHANICALS) ×3 IMPLANT

## 2014-11-15 NOTE — Anesthesia Procedure Notes (Signed)
Procedure Name: Intubation Date/Time: 11/15/2014 1:36 PM Performed by: Noralyn Pick D Pre-anesthesia Checklist: Patient identified, Emergency Drugs available, Suction available and Patient being monitored Patient Re-evaluated:Patient Re-evaluated prior to inductionOxygen Delivery Method: Circle System Utilized Preoxygenation: Pre-oxygenation with 100% oxygen Intubation Type: IV induction, Rapid sequence and Cricoid Pressure applied Laryngoscope Size: Mac and 3 Grade View: Grade II Tube type: Oral Tube size: 7.5 mm Number of attempts: 1 Airway Equipment and Method: Stylet and Oral airway Placement Confirmation: ETT inserted through vocal cords under direct vision,  positive ETCO2 and breath sounds checked- equal and bilateral Secured at: 20 cm Tube secured with: Tape Dental Injury: Teeth and Oropharynx as per pre-operative assessment

## 2014-11-15 NOTE — ED Notes (Signed)
Patient is complaining of nausea and abdominal pain. Patient started to feel sick after dinner around 8pm. Patient called about 0304am.

## 2014-11-15 NOTE — ED Provider Notes (Signed)
CSN: [id number]     Arrival date & time 11/15/14  0346 History   First MD Initiated Contact with Patient 11/15/14 [phone]     Chief Complaint  Patient presents with  . Abdominal Pain  . Nausea     (Consider location/radiation/quality/duration/timing/severity/associated sxs/prior Treatment) HPI 81 year old female presents to emergency department with complaint of lower abdominal pain starting last night after dinner.  Pain is sharp and stabbing in nature.  She has had nausea but no vomiting.  Patient reports regular bowel movements, has bowel movements each morning.  Past medical history significant for appendicitis with perforation 8 months ago.  No fevers or chills.  She denies any urinary complaints.  Remote history of kidney stones. Past Medical History  Diagnosis Date  . Hypertension   . Hyperlipidemia   . Diabetes mellitus without complication   . Vitamin D deficiency   . Asthma   . DDD (degenerative disc disease)   . Spinal stenosis   . Type II or unspecified type diabetes mellitus without mention of complication, not stated as uncontrolled 03/22/2013   Past Surgical History  Procedure Laterality Date  . Hernia repair    . Cyst excision  back  . Cholecystectomy    . Mini-laparotomy w/ tubal ligation     Family History  Problem Relation Age of Onset  . Heart attack Mother   . Cancer Mother     breast  . Hypertension Mother   . Heart attack Father   . Stroke Sister   . Cancer Sister     breast  . Heart attack Brother   . Heart attack Brother    Social History  Substance Use Topics  . Smoking status: Former Research scientist (life sciences)  . Smokeless tobacco: None  . Alcohol Use: No   OB History    No data available     Review of Systems  See History of Present Illness; otherwise all other systems are reviewed and negative   Allergies  Ace inhibitors; Crestor; Minoxidil; Penicillins; and Grapefruit extract  Home Medications   Prior to Admission medications   Medication Sig Start  Date End Date Taking? Authorizing Provider  atenolol (TENORMIN) 100 MG tablet TAKE 1 TABLET BY MOUTH DAILY FOR BLOOD PRESSURE 12/02/13  Yes Unk Pinto, MD  diltiazem (CARDIZEM) 120 MG tablet TAKE 1 TABLET BY MOUTH TWICE DAILY FOR HIGH BLOOD PRESSURE 08/03/13  Yes Vicie Mutters, PA-C  enalapril (VASOTEC) 20 MG tablet TAKE 1 TABLET BY MOUTH TWICE DAILY Patient taking differently: Take 20 mg by mouth daily. TAKE 1 TABLET BY MOUTH TWICE DAILY 01/28/14  Yes Unk Pinto, MD  hydrochlorothiazide (HYDRODIURIL) 25 MG tablet TAKE 1 TABLET BY MOUTH EVERY MORNING 12/02/13  Yes Unk Pinto, MD  oxybutynin (DITROPAN) 5 MG tablet TAKE 1 TABLET BY MOUTH TWICE DAILY FOR BLADDER 01/28/14  Yes Unk Pinto, MD  acetaminophen (TYLENOL) 325 MG tablet Take 2 tablets (650 mg total) by mouth every 6 (six) hours as needed for mild pain (or Fever >/= 101). Patient not taking: Reported on 03/15/2014 03/09/14   Venetia Maxon Rama, MD  alendronate (FOSAMAX) 70 MG tablet Take 1 tablet (70 mg total) by mouth once a week. Take with a full glass of water on an empty stomach. Patient not taking: Reported on 03/15/2014 10/29/13   Unk Pinto, MD  aspirin 325 MG tablet Take 325 mg by mouth daily.    Historical Provider, MD  Cholecalciferol (VITAMIN D-3) 1000 UNITS CAPS Take 4,000 Units by mouth daily.  Historical Provider, MD  Cyanocobalamin (VITAMIN B-12) 2500 MCG SUBL Place 1 tablet under the tongue once a week. Thursdays    Historical Provider, MD  fenofibrate 160 MG tablet  11/03/14   Historical Provider, MD  Flaxseed, Linseed, (FLAXSEED OIL) 1000 MG CAPS Take 1,000 mg by mouth 4 (four) times daily.    Historical Provider, MD  FLUZONE HIGH-DOSE 0.5 ML SUSY  12/09/13   Historical Provider, MD  FREESTYLE LITE test strip 1 each by Other route daily.  01/28/14   Historical Provider, MD  Lancets (FREESTYLE) lancets Test glucose 1 time daily 08/16/13   Unk Pinto, MD  Magnesium Oxide (MAG-OX PO) Take 500 mg by mouth  2 (two) times daily.    Historical Provider, MD  metFORMIN (GLUCOPHAGE XR) 500 MG 24 hr tablet Take 1 tablet with Breakfast AND Lunch and 2 tablets with Supper 03/12/14 03/13/15  Unk Pinto, MD  Multiple Vitamins-Minerals (MULTIVITAMIN & MINERAL PO) Take by mouth daily.    Historical Provider, MD  naproxen sodium (ALEVE) 220 MG tablet Take 220 mg by mouth 2 (two) times daily.    Historical Provider, MD  oxybutynin (DITROPAN) 5 MG tablet TAKE 1 TABLET BY MOUTH TWICE DAILY FOR BLADDER Patient not taking: Reported on 11/15/2014 09/17/14   Unk Pinto, MD  simvastatin (ZOCOR) 40 MG tablet TAKE 1 TABLET BY MOUTH EVERY NIGHT AT BEDTIME FOR CHOLESTEROL Patient not taking: Reported on 11/15/2014 08/03/13   Vicie Mutters, PA-C   BP 124/71 mmHg  Pulse 81  Temp(Src) 97.5 F (36.4 C) (Oral)  Resp 16  Ht 5\' 7"  (1.702 m)  Wt 150 lb (68.04 kg)  BMI 23.49 kg/m2  SpO2 98% Physical Exam  Constitutional: She is oriented to person, place, and time. She appears well-developed and well-nourished.  HENT:  Head: Normocephalic and atraumatic.  Nose: Nose normal.  Mouth/Throat: Oropharynx is clear and moist.  Eyes: Conjunctivae and EOM are normal. Pupils are equal, round, and reactive to light.  Neck: Normal range of motion. Neck supple. No JVD present. No tracheal deviation present. No thyromegaly present.  Cardiovascular: Normal rate, regular rhythm, normal heart sounds and intact distal pulses.  Exam reveals no gallop and no friction rub.   No murmur heard. Pulmonary/Chest: Effort normal and breath sounds normal. No stridor. No respiratory distress. She has no wheezes. She has no rales. She exhibits no tenderness.  Abdominal: Soft. Bowel sounds are normal. She exhibits no distension and no mass. There is tenderness (patient has significant tenderness across lower abdomen with voluntary guarding). There is no rebound and no guarding.  Musculoskeletal: Normal range of motion. She exhibits no edema or  tenderness.  Lymphadenopathy:    She has no cervical adenopathy.  Neurological: She is alert and oriented to person, place, and time. She displays normal reflexes. She exhibits normal muscle tone. Coordination normal.  Skin: Skin is warm and dry. No rash noted. No erythema. No pallor.  Psychiatric: She has a normal mood and affect. Her behavior is normal. Judgment and thought content normal.  Nursing note and vitals reviewed.   ED Course  Procedures (including critical care time) Labs Review Labs Reviewed  LIPASE, BLOOD - Abnormal; Notable for the following:    Lipase 11 (*)    All other components within normal limits  COMPREHENSIVE METABOLIC PANEL - Abnormal; Notable for the following:    Potassium 3.4 (*)    Chloride 100 (*)    Glucose, Bld 131 (*)    BUN 25 (*)  Alkaline Phosphatase 37 (*)    All other components within normal limits  CBC - Abnormal; Notable for the following:    WBC 2.5 (*)    All other components within normal limits  URINALYSIS, ROUTINE W REFLEX MICROSCOPIC (NOT AT [facility]) - Abnormal; Notable for the following:    APPearance TURBID (*)    Protein, ur 30 (*)    All other components within normal limits  URINE MICROSCOPIC-ADD ON - Abnormal; Notable for the following:    Squamous Epithelial / LPF FEW (*)    Bacteria, UA MANY (*)    Crystals TRIPLE PHOSPHATE CRYSTALS (*)    All other components within normal limits  URINE CULTURE    Imaging Review Ct Abdomen Pelvis W Contrast  11/15/2014   CLINICAL DATA:  81 year old female with lower abdominal pain and nausea, which began yesterday.  EXAM: CT ABDOMEN AND PELVIS WITH CONTRAST  TECHNIQUE: Multidetector CT imaging of the abdomen and pelvis was performed using the standard protocol following bolus administration of intravenous contrast.  CONTRAST:  68mL OMNIPAQUE IOHEXOL 300 MG/ML SOLN, 131mL OMNIPAQUE IOHEXOL 300 MG/ML SOLN  COMPARISON:  CT the abdomen and pelvis 03/15/2014.  FINDINGS: Lower chest: Again  noted is bronchiectasis and scarring in the right lower lobe where there appears to be chronic volume loss. Mild scarring in the left lower lobe is also similar to the prior study.  Hepatobiliary: Status post cholecystectomy. Moderate intrahepatic biliary ductal dilatation, similar to the prior examination. Common bile duct remains dilated, measuring up to 13 mm in the porta hepatis, similar to the prior examination. No suspicious cystic or solid hepatic lesions.  Pancreas: No pancreatic mass. No pancreatic ductal dilatation. No pancreatic or peripancreatic fluid or inflammatory changes.  Spleen: Unremarkable.  Adrenals/Urinary Tract: Bilateral adrenal glands are normal in appearance. 1 cm simple cyst in the anterior aspect of the interpolar region of the right kidney. Several tiny sub cm low-attenuation lesions are noted in the kidneys bilaterally, too small to definitively characterize, but similar to prior examinations, favored to represent small cysts. No hydroureteronephrosis. Urinary bladder is normal in appearance.  Stomach/Bowel: The appearance of the stomach is normal. No pathologic dilatation of small bowel or colon. The appendix appears enlarged, measuring in diameter, and there appears to be extensive enhancement of the appendiceal mucosa. Although there is a small amount of periappendiceal fluid, this is indistinguishable from the greater volume of ascites. No discrete periappendiceal abscess is identified at this time.  Vascular/Lymphatic: Extensive atherosclerosis throughout the abdominal and pelvic vasculature, without evidence of aneurysm or dissection. No lymphadenopathy noted in the abdomen or pelvis.  Reproductive: Uterus and ovaries are atrophic.  Other: Small volume of ascites.  No pneumoperitoneum.  Musculoskeletal: There are no aggressive appearing lytic or blastic lesions noted in the visualized portions of the skeleton.  IMPRESSION: 1. The appendix appears enlarged, and there is avid  enhancement of the appendiceal mucosa, which could be indicative of recurrent acute appendicitis. There is a small volume of ascites. However, there is no definite periappendiceal abscess noted at this time. 2. No other definite acute findings identified in the abdomen or pelvis. 3. Status post cholecystectomy. Chronic intra and extrahepatic biliary ductal dilatation, similar to the prior examination, presumably related to post cholecystectomy physiology. 4. Extensive atherosclerosis. 5. Additional incidental findings, as above, similar to prior examinations. These results were called by telephone at the time of interpretation on 11/15/2014 at 7:26 am to Dr. Linton Flemings, who verbally acknowledged these results.   Electronically  Signed   By: Vinnie Langton M.D.   On: 11/15/2014 07:27   I have personally reviewed and evaluated these images and lab results as part of my medical decision-making.   EKG Interpretation None      MDM   Final diagnoses:  Appendicitis, unspecified appendicitis type    81 year old female with lower abdominal pain.  Plan for labs, CT abdomen pelvis.  Patient has past history of perforated appendicitis and is at higher risk for adhesions/small bowel structure.   7:43 AM Case d/w Dr Johney Maine on call for general surgery.  He will see the patient.   Linton Flemings, MD 11/15/14 [phone]

## 2014-11-15 NOTE — Op Note (Signed)
3:31 PM  PATIENT:  Kristine Burnett  81 y.o. female  Patient Care Team: Unk Pinto, MD as PCP - General (Internal Medicine) Sharyne Peach, MD as Consulting Physician (Ophthalmology) Lafayette Dragon, MD as Consulting Physician (Gastroenterology) Armandina Gemma, MD as Consulting Physician (General Surgery)  PRE-OPERATIVE DIAGNOSIS:  appendicitis  POST-OPERATIVE DIAGNOSIS:  appendicitis, (previously ruptured appendix), abscess, peritonitis  PROCEDURE:  Procedure(s): APPENDECTOMY LAPAROSCOPIC with partial cecetomy  SURGEON:  Surgeon(s): Michael Boston, MD  ANESTHESIA:   local and general  EBL:  Total I/O In: 1300 [I.V.:1300] Out: 350 [Urine:350]  Delay start of Pharmacological VTE agent (>24hrs) due to surgical blood loss or risk of bleeding:  no  DRAINS: none   SPECIMEN:  Source of Specimen:   APPENDIX  DISPOSITION OF SPECIMEN:  PATHOLOGY  COUNTS:  YES  PLAN OF CARE: Admit to inpatient   PATIENT DISPOSITION:  PACU - hemodynamically stable.   INDICATIONS: Patient with concerning symptoms & work up suspicious for appendicitis.  Surgery was recommended:  The anatomy & physiology of the digestive tract was discussed.  The pathophysiology of appendicitis was discussed.  Natural history risks without surgery was discussed.   I feel the risks of no intervention will lead to serious problems that outweigh the operative risks; therefore, I recommended diagnostic laparoscopy with removal of appendix to remove the pathology.  Laparoscopic & open techniques were discussed.   I noted a good likelihood this will help address the problem.    Risks such as bleeding, infection, abscess, leak, reoperation, possible ostomy, hernia, heart attack, death, and other risks were discussed.  Goals of post-operative recovery were discussed as well.  We will work to minimize complications.  Questions were answered.  The patient expresses understanding & wishes to proceed with surgery.  OR FINDINGS:  Obvious inflammation and suprapubic and right lower quadrant region.  Walled off appendix with tip along the right pelvic brim.  Moderate volume of pus and pelvis.  Moderate old chronic adhesions and right upper quadrant from prior cholecystectomy/ERCP.  No evidence of bowel obstruction.  No interloop adhesions.  Inflammation on the cecum bed nowhere else in the colon.   DESCRIPTION:   The patient was identified & brought into the operating room. The patient was positioned supine with arms tucked. SCDs were active during the entire case. The patient underwent general anesthesia without any difficulty.  The abdomen was prepped and draped in a sterile fashion. A Surgical Timeout confirmed our plan.   I made a transverse incision through the superior umbilical fold.  I made a small transverse nick through the infraumbilical fascia and confirmed peritoneal entry.  I placed a 37mm port.  We induced carbon dioxide insufflation.  Camera inspection revealed no injury.  I placed additional ports under direct laparoscopic visualization.  Camera inspection revealed obvious inflammation of greater omentum densely adherent to the suprapubic and right lower quadrant region.  Moderate chronic adhesions in the right upper quadrant as well.  I freed some right upper quadrant adhesions to work my way down to the right lower quadrant.  Isolated he freed adhesions of greater omentum off the anterior abdominal wall and suprapubic region.  With that I could reflect the greater omentum out of the way.  Positioned the patient more in steep Trendelenburg.  Lyse most the small bowel the pelvis to reveal obvious frank pus in the pelvis.  This was aspirated.  Did numerous serial irrigations and aspirations of a liter to help clear that out.  Had obvious peritonitis.  I mobilized the terminal ileum to proximal ascending colon in a lateral to medial fashion.  I took care to avoid injuring any retroperitoneal structures.  With that I  could expose and appendix with the tip densely adherent to the retroperitoneum along the pelvic rim.  Came around the tip and freed that off.  The tip was not as inflamed as more of the base.  Phlegmon associated with it.  I freed the appendix off its attachments to the ascending colon and cecal mesentery.  I elevated the appendix. I transected the mesoappendix. I was able to free off the base of the appendix which was still viable.  The cecum had some outpouching with inflammation on it.  Most likely secondarily inflamed.  Distal cecum and ascending colon not inflamed.  I stapled the appendix off  with this inflamed proximal cecum using a 56mm laparoscopic stapler x 2 firings.  I took care to transect parallel to the ileocecal valve and avoid any kinking/narrowing there.  I placed the appendix and transected en bloc cecum inside an EndoCatch bag and removed out the 12 mm port.  I had to dilate the fascia to get the inflamed appendix and cecum out  I did copious irrigation. Hemostasis was good in the mesoappendix, colon mesentery, and retroperitoneum.  Controlled hemostasis on the greater omentum.  Staple line was intact on the cecum with no bleeding.   Been adherent to the appendix and pelvis.  Otherwise normal.  I freed adhesions in the right upper quadrant to free greater omentum out of the right upper quadrant related fall down more towards the pelvis.  I washed out the pelvis, retrohepatic space and right paracolic gutter. I washed out the left side as well.  Hemostasis is good. There was no perforation or injury. Because the area cleaned up well after irrigation, I did not place a drain.  I did a final irrigation of antibiotics solution (clindamycin/gentamicin) ease.  I let that rest in place for 10 minutes while I ran the small bowel.  I ran the small bowel from the ileocecal valve proximally to the ligament of Treitz.  There were no interloop adhesions.  No Meckel's diverticulum.  Mild ileal secondary  inflammation where he had been down in the pelvis.  I aspirated the carbon dioxide. I removed the ports. I closed the 12 mm fascia site using a #1 PDS & 0 Vicryl interrupted stitches. I closed skin using 4-0 monocryl stitch.  Patient was extubated and sent to the recovery room.  I discussed the operative findings with the patient's family. I suspect the patient is going used in the hospital at least overnight and will need antibiotics for 7 days. Questions answered. They expressed understanding and appreciation.  Adin Hector, M.D., F.A.C.S. Gastrointestinal and Minimally Invasive Surgery [facility] [address] [place], [place] [address] [phone] Main / Paging

## 2014-11-15 NOTE — Discharge Instructions (Signed)
You need a colonoscopy done in November to rule out any colon malignancy or other pathology.  Call your gastroenterologist with [facility], Dr Olevia Perches, or one of her partners.    LAPAROSCOPIC SURGERY: POST OP INSTRUCTIONS  1. DIET: Follow a light bland diet the first 24 hours after arrival home, such as soup, liquids, crackers, etc.  Be sure to include lots of fluids daily.  Avoid fast food or heavy meals as your are more likely to get nauseated.  Eat a low fat the next few days after surgery.   2. Take your usually prescribed home medications unless otherwise directed. 3. PAIN CONTROL: a. Pain is best controlled by a usual combination of three different methods TOGETHER: i. Ice/Heat ii. Over the counter pain medication iii. Prescription pain medication b. Most patients will experience some swelling and bruising around the incisions.  Ice packs or heating pads (30-60 minutes up to 6 times a day) will help. Use ice for the first few days to help decrease swelling and bruising, then switch to heat to help relax tight/sore spots and speed recovery.  Some people prefer to use ice alone, heat alone, alternating between ice & heat.  Experiment to what works for you.  Swelling and bruising can take several weeks to resolve.   c. It is helpful to take an over-the-counter pain medication regularly for the first few weeks.  Choose one of the following that works best for you: i. Naproxen (Aleve, etc)  Two 220mg  tabs twice a day ii. Ibuprofen (Advil, etc) Three 200mg  tabs four times a day (every meal & bedtime) iii. Acetaminophen (Tylenol, etc) 500-650mg  four times a day (every meal & bedtime) d. A  prescription for pain medication (such as oxycodone, hydrocodone, etc) should be given to you upon discharge.  Take your pain medication as prescribed.  i. If you are having problems/concerns with the prescription medicine (does not control pain, nausea, vomiting, rash, itching, etc), please call us [phone]  to see if we need to switch you to a different pain medicine that will work better for you and/or control your side effect better. ii. If you need a refill on your pain medication, please contact your pharmacy.  They will contact our office to request authorization. Prescriptions will not be filled after 5 pm or on week-ends. 4. Avoid getting constipated.  Between the surgery and the pain medications, it is common to experience some constipation.  Increasing fluid intake and taking a fiber supplement (such as Metamucil, Citrucel, FiberCon, MiraLax, etc) 1-2 times a day regularly will usually help prevent this problem from occurring.  A mild laxative (prune juice, Milk of Magnesia, MiraLax, etc) should be taken according to package directions if there are no bowel movements after 48 hours.   5. Watch out for diarrhea.  If you have many loose bowel movements, simplify your diet to bland foods & liquids for a few days.  Stop any stool softeners and decrease your fiber supplement.  Switching to mild anti-diarrheal medications (Kayopectate, Pepto Bismol) can help.  If this worsens or does not improve, please call us. 6. Wash / shower every day.  You may shower over the dressings as they are waterproof.  Continue to shower over incision(s) after the dressing is off. 7. Remove your waterproof bandages 5 days after surgery.  You may leave the incision open to air.  You may replace a dressing/Band-Aid to cover the incision for comfort if you wish.  8. ACTIVITIES as tolerated:  a. You may resume regular (light) daily activities beginning the next day--such as daily self-care, walking, climbing stairs--gradually increasing activities as tolerated.  If you can walk 30 minutes without difficulty, it is safe to try more intense activity such as jogging, treadmill, bicycling, low-impact aerobics, swimming, etc. b. Save the most intensive and strenuous activity for last such as sit-ups, heavy lifting, contact sports, etc   Refrain from any heavy lifting or straining until you are off narcotics for pain control.   c. DO NOT PUSH THROUGH PAIN.  Let pain be your guide: If it hurts to do something, don't do it.  Pain is your body warning you to avoid that activity for another week until the pain goes down. d. You may drive when you are no longer taking prescription pain medication, you can comfortably wear a seatbelt, and you can safely maneuver your car and apply brakes. e. Dennis Bast may have sexual intercourse when it is comfortable.  9. FOLLOW UP in our office a. Please call [facility] at [phone] to set up an appointment to see your surgeon in the office for a follow-up appointment approximately 2-3 weeks after your surgery. b. Make sure that you call for this appointment the day you arrive home to insure a convenient appointment time. 10. IF YOU HAVE DISABILITY OR FAMILY LEAVE FORMS, BRING THEM TO THE OFFICE FOR PROCESSING.  DO NOT GIVE THEM TO YOUR DOCTOR.   WHEN TO CALL us [phone]: 1. Poor pain control 2. Reactions / problems with new medications (rash/itching, nausea, etc)  3. Fever over 101.5 F (38.5 C) 4. Inability to urinate 5. Nausea and/or vomiting 6. Worsening swelling or bruising 7. Continued bleeding from incision. 8. Increased pain, redness, or drainage from the incision   The clinic staff is available to answer your questions during regular business hours (8:30am-5pm).  Please dont hesitate to call and ask to speak to one of our nurses for clinical concerns.   If you have a medical emergency, go to the nearest emergency room or call 911.  A surgeon from [facility] is always on call at the [facility], [place], [place], [place], [place]  [address] ? MAIN: [phone] ? TOLL FREE: [phone] ?  FAX [phone] [email or web address]   Appendicitis Appendicitis is when the appendix is swollen (inflamed). The  inflammation can lead to developing a hole (perforation) and a collection of pus (abscess). CAUSES  There is not always an obvious cause of appendicitis. Sometimes it is caused by an obstruction in the appendix. The obstruction can be caused by:  A small, hard, pea-sized ball of stool (fecalith).  Enlarged lymph glands in the appendix. SYMPTOMS   Pain around your belly button (navel) that moves toward your lower right belly (abdomen). The pain can become more severe and sharp as time passes.  Tenderness in the lower right abdomen. Pain gets worse if you cough or make a sudden movement.  Feeling sick to your stomach (nauseous).  Throwing up (vomiting).  Loss of appetite.  Fever.  Constipation.  Diarrhea.  Generally not feeling well. DIAGNOSIS   Physical exam.  Blood tests.  Urine test.  X-rays or a CT scan may confirm the diagnosis. TREATMENT  Once the diagnosis of appendicitis is made, the most common treatment is to remove the appendix as soon as possible. This procedure is called appendectomy. In an open appendectomy, a cut (incision) is made in the lower right abdomen  and the appendix is removed. In a laparoscopic appendectomy, usually 3 small incisions are made. Long, thin instruments and a camera tube are used to remove the appendix. Most patients go home in 24 to 48 hours after appendectomy. In some situations, the appendix may have already perforated and an abscess may have formed. The abscess may have a "wall" around it as seen on a CT scan. In this case, a drain may be placed into the abscess to remove fluid, and you may be treated with antibiotic medicines that kill germs. The medicine is given through a tube in your vein (IV). Once the abscess has resolved, it may or may not be necessary to have an appendectomy. You may need to stay in the hospital longer than 48 hours. Document Released: 03/07/2005 Document Revised: 09/06/2011 Document Reviewed: 06/02/2009 [facility]  Patient Information 2015 [place], [place]. This information is not intended to replace advice given to you by your health care provider. Make sure you discuss any questions you have with your health care provider.   Laparoscopic Appendectomy Appendectomy is surgery to remove the appendix. Laparoscopic surgery uses several small cuts (incisions) instead of one large incision. Laparoscopic surgery offers a shorter recovery time and less discomfort. LET YOUR CAREGIVER KNOW ABOUT:  Allergies to food or medicine.  Medicines taken, including vitamins, dietary supplements, herbs, eyedrops, over-the-counter medicines, and creams.  Use of steroids (by mouth or creams).  Previous problems with anesthetics or numbing medicines.  History of bleeding problems or blood clots.  Previous surgery.  Other health problems, including diabetes, heart problems, lung problems, and kidney problems.  Possibility of pregnancy, if this applies. RISKS AND COMPLICATIONS  Infection. A germ starts growing in the wound. This can usually be treated with antibiotics. In some cases, the wound will need to be opened and cleaned.  Bleeding.  Damage to other organs.  Sores (abscesses).  Chronic pain at the incision sites. This is defined as pain that lasts for more than 3 months.  Blood clots in the legs that may rarely travel to the lungs.  Infection in the lungs (pneumonia). BEFORE THE PROCEDURE Appendectomy is usually performed immediately after an inflamed appendix (appendicitis) is diagnosed. No preparation is necessary ahead of this procedure. PROCEDURE  You will be given medicine that makes you sleep (general anesthetic). After you are asleep, a flexible tube (catheter) may be inserted into your bladder to drain your urine during surgery. The tube is removed before you wake up after surgery. When you are asleep, carbondioxide gas will be used to inflate your abdomen. This will allow your surgeon to see  inside your abdomen and perform your surgery. Three small incisions will be made in your abdomen. Your surgeon will insert a thin, lighted tube (laparoscope) through one of the incisions. Your surgeon will look through the laparoscope while performing the surgery. Other tools will be inserted through the other incisions. Laparoscopic procedures may not be appropriate when:  There is major scarring from a previous surgery.  The patient has bleeding disorders.  A pregnancy is near term.  There are other conditions which make the laparoscopic procedure impossible, such as an advanced infection or a ruptured appendix. If your surgeon feels it is not safe to continue with the laparoscopic procedure, he or she will perform an open surgery instead. This gives the surgeon a larger view and more space to work. Open surgery requires a longer recovery time. After your appendix is removed, your incisions will be closed with stitches (sutures)  or skin adhesive. AFTER THE PROCEDURE You will be taken to a recovery room. When the anesthesia has worn off, you will be returned to your hospital room. You will be given pain medicines to keep you comfortable. Ask your caregiver how long your hospital stay will be. Document Released: 10/20/2003 Document Revised: 05/30/2011 Document Reviewed: 09/14/2010 [facility] Patient Information 2015 [place], [place]. This information is not intended to replace advice given to you by your health care provider. Make sure you discuss any questions you have with your health care provider.

## 2014-11-15 NOTE — Anesthesia Postprocedure Evaluation (Signed)
  Anesthesia Post-op Note  Patient: Kristine Burnett  Procedure(s) Performed: Procedure(s): APPENDECTOMY LAPAROSCOPIC (N/A)  Patient Location: PACU  Anesthesia Type:General  Level of Consciousness: awake, alert  and oriented  Airway and Oxygen Therapy: Patient Spontanous Breathing  Post-op Pain: mild  Post-op Assessment: Post-op Vital signs reviewed, Patient's Cardiovascular Status Stable, Respiratory Function Stable, Patent Airway, No signs of Nausea or vomiting and Pain level controlled              Post-op Vital Signs: Reviewed and stable  Last Vitals:  Filed Vitals:   11/15/14 1615  BP: 149/65  Pulse: 69  Temp: 36.1 C  Resp: 20    Complications: No apparent anesthesia complications

## 2014-11-15 NOTE — H&P (Signed)
[facility]  [address] [place], [place] [address] Phone: [phone] FAX: [phone]     MICHAL STRZELECKI  [birth year] [id number]  CARE TEAM:  PCP: Alesia Richards, MD  Outpatient Care Team: Patient Care Team: Unk Pinto, MD as PCP - General (Internal Medicine) Sharyne Peach, MD as Consulting Physician (Ophthalmology) Lafayette Dragon, MD as Consulting Physician (Gastroenterology) Armandina Gemma, MD as Consulting Physician (General Surgery)  Inpatient Treatment Team: Treatment Team: Attending Provider: Linton Flemings, MD; Registered Nurse: Beckie Busing, RN; Registered Nurse: Braulio Conte, RN; Technician: Lowry Bowl, NT; Consulting Physician: Nolon Nations, MD  This patient is a 81 y.o.female who presents today for surgical evaluation at the request of Dr Sharol Given.   Reason for evaluation: Recurrent appendicitis.  Pleasant elderly woman.  Here today with her daughters.  Had episode of abdominal pain.  Found to have evidence of perforation and peritonitis in the right lower quadrant and December.  Admitted.  Placed on IV antibiotics.  Formulate an abscess.  This is percutaneously drained.  Suspicious for acute appendicitis.  Followed by medicine and surgery.  Recommendation made for outpatient surgical follow-up.  Readmitted in late December for small bowel obstruction.  Stabilized.  IV antibiotics.  Abscess resolved.  Drain eventually removed.  Discharged in early January.  Patient never followed up with surgery afterwards as best I can gather.  She has not had her appendix removed.  She has not had a colonoscopy.  Patient notes that she had been eating more normally and back to her daily bowel movements.  No severe bouts of nausea or vomiting.  Yesterday she started knowing noting lower crampy abdominal pain.  About the same on both sides.  Worsening nausea as well.  No emesis.  Based on concerns of recurrent abdominal infection, she  asked her family to bring her to the emergency room this morning.  CT scan shows thickening and inflammation of the appendix and some free fluid suspicious for recurrent appendicitis.  No new abscess.  No obstruction.  No perforation.  Surgical consultation requested.  While elderly the patient is relatively independent.  Helps take care of a family member.  Drives.  Sometimes uses a walker or cane for balance but denies any history of heart or lung issues.  No sick contacts or travel history.  Normal colonoscopy in 2010 by Dr. Delfin Edis.  No episodes of hematemesis or coffee-ground emesis.  She had a tubal ligation decades ago.  She had a laparoscopic cholecystectomy a few years ago.  No other abdominal surgeries.  Past Medical History  Diagnosis Date  . Hypertension   . Hyperlipidemia   . Diabetes mellitus without complication   . Vitamin D deficiency   . Asthma   . DDD (degenerative disc disease)   . Spinal stenosis   . Type II or unspecified type diabetes mellitus without mention of complication, not stated as uncontrolled 03/22/2013  . Right lower quadrant abdominal abscess   . SBO (small bowel obstruction) 03/15/2014    Past Surgical History  Procedure Laterality Date  . Hernia repair    . Cyst excision  back  . Cholecystectomy    . Mini-laparotomy w/ tubal ligation      Social History   Social History  . Marital Status: Widowed    Spouse Name: N/A  . Number of Children: N/A  . Years of Education: N/A   Occupational History  . Not on file.   Social  History Main Topics  . Smoking status: Former Research scientist (life sciences)  . Smokeless tobacco: Not on file  . Alcohol Use: No  . Drug Use: No  . Sexual Activity: Not on file   Other Topics Concern  . Not on file   Social History Narrative    Family History  Problem Relation Age of Onset  . Heart attack Mother   . Cancer Mother     breast  . Hypertension Mother   . Heart attack Father   . Stroke Sister   . Cancer Sister      breast  . Heart attack Brother   . Heart attack Brother     Current Facility-Administered Medications  Medication Dose Route Frequency Provider Last Rate Last Dose  . ondansetron (ZOFRAN-ODT) disintegrating tablet 4 mg  4 mg Oral Once PRN Linton Flemings, MD       Current Outpatient Prescriptions  Medication Sig Dispense Refill  . aspirin 325 MG tablet Take 325 mg by mouth daily.    Marland Kitchen atenolol (TENORMIN) 100 MG tablet TAKE 1 TABLET BY MOUTH DAILY FOR BLOOD PRESSURE 90 tablet 1  . Cholecalciferol (VITAMIN D-3) 1000 UNITS CAPS Take 2,000 Units by mouth daily.     . Coenzyme Q10 200 MG capsule Take 200 mg by mouth daily.    . Cyanocobalamin (VITAMIN B-12) 2500 MCG SUBL Place 5,000 tablets under the tongue daily. Thursdays    . diltiazem (CARDIZEM) 120 MG tablet TAKE 1 TABLET BY MOUTH TWICE DAILY FOR HIGH BLOOD PRESSURE 180 tablet 98  . enalapril (VASOTEC) 20 MG tablet TAKE 1 TABLET BY MOUTH TWICE DAILY (Patient taking differently: Take 20 mg by mouth daily. TAKE 1 TABLET BY MOUTH TWICE DAILY) 60 tablet 2  . fenofibrate 160 MG tablet Take 160 mg by mouth daily.     . Flaxseed, Linseed, (FLAXSEED OIL) 1000 MG CAPS Take 1,000 mg by mouth 4 (four) times daily.    . hydrochlorothiazide (HYDRODIURIL) 25 MG tablet TAKE 1 TABLET BY MOUTH EVERY MORNING 90 tablet 98  . Magnesium Oxide (MAG-OX PO) Take 500 mg by mouth daily.     . Multiple Vitamins-Minerals (MULTIVITAMIN & MINERAL PO) Take by mouth daily.    . naproxen sodium (ALEVE) 220 MG tablet Take 440-660 mg by mouth 2 (two) times daily as needed (pain).     Marland Kitchen oxybutynin (DITROPAN) 5 MG tablet TAKE 1 TABLET BY MOUTH TWICE DAILY FOR BLADDER 60 tablet 5  . vitamin C (ASCORBIC ACID) 500 MG tablet Take 500 mg by mouth 2 (two) times daily.    Marland Kitchen acetaminophen (TYLENOL) 325 MG tablet Take 2 tablets (650 mg total) by mouth every 6 (six) hours as needed for mild pain (or Fever >/= 101). (Patient not taking: Reported on 03/15/2014)    . alendronate (FOSAMAX) 70  MG tablet Take 1 tablet (70 mg total) by mouth once a week. Take with a full glass of water on an empty stomach. (Patient not taking: Reported on 03/15/2014) 4 tablet 2  . FLUZONE HIGH-DOSE 0.5 ML SUSY     . FREESTYLE LITE test strip 1 each by Other route daily.     . Lancets (FREESTYLE) lancets Test glucose 1 time daily (Patient not taking: Reported on 11/15/2014) 100 each 1  . metFORMIN (GLUCOPHAGE XR) 500 MG 24 hr tablet Take 1 tablet with Breakfast AND Lunch and 2 tablets with Supper (Patient not taking: Reported on 11/15/2014) 360 tablet 99  . oxybutynin (DITROPAN) 5 MG tablet TAKE 1 TABLET BY  MOUTH TWICE DAILY FOR BLADDER (Patient not taking: Reported on 11/15/2014) 60 tablet 0  . simvastatin (ZOCOR) 40 MG tablet TAKE 1 TABLET BY MOUTH EVERY NIGHT AT BEDTIME FOR CHOLESTEROL (Patient not taking: Reported on 11/15/2014) 90 tablet 98     Allergies  Allergen Reactions  . Ace Inhibitors Cough  . Crestor [Rosuvastatin] Other (See Comments)    Legs cramp   . Minoxidil Other (See Comments)    Grow hair on face  . Penicillins     unknwon  . Grapefruit Extract Rash    ROS: Constitutional:  No fevers, chills, sweats.  Weight stable Eyes:  No vision changes, No discharge HENT:  No sore throats, nasal drainage Lymph: No neck swelling, No bruising easily Pulmonary:  No cough, productive sputum CV: No orthopnea, PND  Patient walks 20 minutes without difficulty.  No exertional chest/neck/shoulder/arm pain. GI:  No personal nor family history of GI/colon cancer, inflammatory bowel disease, irritable bowel syndrome, allergy such as Celiac Sprue, dietary/dairy problems, colitis, ulcers nor gastritis.  No recent sick contacts/gastroenteritis.  No travel outside the country.  No changes in diet. Renal: No UTIs, No hematuria Genital:  No drainage, bleeding, masses Musculoskeletal: No severe joint pain.  Good ROM major joints Skin:  No sores or lesions.  No rashes Heme/Lymph:  No easy bleeding.  No  swollen lymph nodes Neuro: No focal weakness/numbness.  No seizures Psych: No suicidal ideation.  No hallucinations  BP 137/66 mmHg  Pulse 81  Temp(Src) 97.5 F (36.4 C) (Oral)  Resp 16  Ht _0  (1.702 m)  Wt 68.04 kg (150 lb)  BMI 23.49 kg/m2  SpO2 90%  Physical Exam: General: Pt awake/alert/oriented x4 in no major acute distress Eyes: PERRL, normal EOM. Sclera nonicteric Neuro: CN II-XII intact w/o focal sensory/motor deficits. Lymph: No head/neck/groin lymphadenopathy Psych:  No delerium/psychosis/paranoia HENT: Normocephalic, Mucus membranes moist.  No thrush Neck: Supple, No tracheal deviation Chest: No pain.  Good respiratory excursion. CV:  Pulses intact.  Regular rhythm Abdomen: Soft, Nondistended.  Tenderness to palpation in lower abdomen.  Right lower quadrant slightly greater than suprapubic region.  Mild voluntary guarding times once.  No diffuse peritonitis.  No incisional hernias.  No umbilical hernia.  Rest of the abdomen is nontender  Genital: No inguinal or groin hernias.  No vaginal bleeding or discharge.. Ext:  SCDs BLE.  No significant edema.  No cyanosis Skin: No petechiae / purpurea.  No major sores Musculoskeletal: No severe joint pain.  Good ROM major joints   Results:   Labs: Results for orders placed or performed during the hospital encounter of 11/15/14 (from the past 48 hour(s))  Lipase, blood     Status: Abnormal   Collection Time: 11/15/14  4:58 AM  Result Value Ref Range   Lipase 11 (L) 22 - 51 U/L  Comprehensive metabolic panel     Status: Abnormal   Collection Time: 11/15/14  4:58 AM  Result Value Ref Range   Sodium 136 135 - 145 mmol/L   Potassium 3.4 (L) 3.5 - 5.1 mmol/L   Chloride 100 (L) 101 - 111 mmol/L   CO2 28 22 - 32 mmol/L   Glucose, Bld 131 (H) 65 - 99 mg/dL   BUN 25 (H) 6 - 20 mg/dL   Creatinine, Ser 0.81 0.44 - 1.00 mg/dL   Calcium 9.7 8.9 - 10.3 mg/dL   Total Protein 6.9 6.5 - 8.1 g/dL   Albumin 3.6 3.5 - 5.0 g/dL    AST 34  15 - 41 U/L   ALT 16 14 - 54 U/L   Alkaline Phosphatase 37 (L) 38 - 126 U/L   Total Bilirubin 1.0 0.3 - 1.2 mg/dL   GFR calc non Af Amer >60 >60 mL/min   GFR calc Af Amer >60 >60 mL/min    Comment: (NOTE) The eGFR has been calculated using the CKD EPI equation. This calculation has not been validated in all clinical situations. eGFR's persistently <60 mL/min signify possible Chronic Kidney Disease.    Anion gap 8 5 - 15  CBC     Status: Abnormal   Collection Time: 11/15/14  4:58 AM  Result Value Ref Range   WBC 2.5 (L) 4.0 - 10.5 K/uL   RBC 4.42 3.87 - 5.11 MIL/uL   Hemoglobin 14.0 12.0 - 15.0 g/dL   HCT 41.7 36.0 - 46.0 %   MCV 94.3 78.0 - 100.0 fL   MCH 31.7 26.0 - 34.0 pg   MCHC 33.6 30.0 - 36.0 g/dL   RDW 13.9 11.5 - 15.5 %   Platelets 154 150 - 400 K/uL  Urinalysis, Routine w reflex microscopic (not at [facility])     Status: Abnormal   Collection Time: 11/15/14  6:50 AM  Result Value Ref Range   Color, Urine YELLOW YELLOW   APPearance TURBID (A) CLEAR   Specific Gravity, Urine 1.019 1.005 - 1.030   pH 8.0 5.0 - 8.0   Glucose, UA NEGATIVE NEGATIVE mg/dL   Hgb urine dipstick NEGATIVE NEGATIVE   Bilirubin Urine NEGATIVE NEGATIVE   Ketones, ur NEGATIVE NEGATIVE mg/dL   Protein, ur 30 (A) NEGATIVE mg/dL   Urobilinogen, UA 0.2 0.0 - 1.0 mg/dL   Nitrite NEGATIVE NEGATIVE   Leukocytes, UA NEGATIVE NEGATIVE  Urine microscopic-add on     Status: Abnormal   Collection Time: 11/15/14  6:50 AM  Result Value Ref Range   Squamous Epithelial / LPF FEW (A) RARE   Bacteria, UA MANY (A) RARE   Crystals TRIPLE PHOSPHATE CRYSTALS (A) NEGATIVE   Urine-Other URINALYSIS PERFORMED ON SUPERNATANT     Imaging / Studies: Ct Abdomen Pelvis W Contrast  11/15/2014   CLINICAL DATA:  81 year old female with lower abdominal pain and nausea, which began yesterday.  EXAM: CT ABDOMEN AND PELVIS WITH CONTRAST  TECHNIQUE: Multidetector CT imaging of the abdomen and pelvis was performed using the  standard protocol following bolus administration of intravenous contrast.  CONTRAST:  24m OMNIPAQUE IOHEXOL 300 MG/ML SOLN, 1078mOMNIPAQUE IOHEXOL 300 MG/ML SOLN  COMPARISON:  CT the abdomen and pelvis 03/15/2014.  FINDINGS: Lower chest: Again noted is bronchiectasis and scarring in the right lower lobe where there appears to be chronic volume loss. Mild scarring in the left lower lobe is also similar to the prior study.  Hepatobiliary: Status post cholecystectomy. Moderate intrahepatic biliary ductal dilatation, similar to the prior examination. Common bile duct remains dilated, measuring up to 13 mm in the porta hepatis, similar to the prior examination. No suspicious cystic or solid hepatic lesions.  Pancreas: No pancreatic mass. No pancreatic ductal dilatation. No pancreatic or peripancreatic fluid or inflammatory changes.  Spleen: Unremarkable.  Adrenals/Urinary Tract: Bilateral adrenal glands are normal in appearance. 1 cm simple cyst in the anterior aspect of the interpolar region of the right kidney. Several tiny sub cm low-attenuation lesions are noted in the kidneys bilaterally, too small to definitively characterize, but similar to prior examinations, favored to represent small cysts. No hydroureteronephrosis. Urinary bladder is normal in appearance.  Stomach/Bowel: The appearance  of the stomach is normal. No pathologic dilatation of small bowel or colon. The appendix appears enlarged, measuring in diameter, and there appears to be extensive enhancement of the appendiceal mucosa. Although there is a small amount of periappendiceal fluid, this is indistinguishable from the greater volume of ascites. No discrete periappendiceal abscess is identified at this time.  Vascular/Lymphatic: Extensive atherosclerosis throughout the abdominal and pelvic vasculature, without evidence of aneurysm or dissection. No lymphadenopathy noted in the abdomen or pelvis.  Reproductive: Uterus and ovaries are atrophic.  Other:  Small volume of ascites.  No pneumoperitoneum.  Musculoskeletal: There are no aggressive appearing lytic or blastic lesions noted in the visualized portions of the skeleton.  IMPRESSION: 1. The appendix appears enlarged, and there is avid enhancement of the appendiceal mucosa, which could be indicative of recurrent acute appendicitis. There is a small volume of ascites. However, there is no definite periappendiceal abscess noted at this time. 2. No other definite acute findings identified in the abdomen or pelvis. 3. Status post cholecystectomy. Chronic intra and extrahepatic biliary ductal dilatation, similar to the prior examination, presumably related to post cholecystectomy physiology. 4. Extensive atherosclerosis. 5. Additional incidental findings, as above, similar to prior examinations. These results were called by telephone at the time of interpretation on 11/15/2014 at 7:26 am to Dr. Linton Flemings, who verbally acknowledged these results.   Electronically Signed   By: Vinnie Langton M.D.   On: 11/15/2014 07:27    Medications / Allergies: per chart  Antibiotics: Anti-infectives    None      Assessment  [name]  81 y.o. female     Procedure(s): APPENDECTOMY LAPAROSCOPIC  Problem List:  Principal Problem:   Recurrent appendicitis Active Problems:   CKD stage 2 due to type 2 diabetes mellitus   Diabetes mellitus type 2, controlled   Nausea and vomiting   Malnutrition of moderate degree   Recurrent inflammation and swelling in right lower quadrant suspicious for recurrent appendicitis.  No evidence of perforation obstruction or abscess.  Less than 24-hour history of symptoms with over 6 months of being asymptomatic.  Plan:   Admit.  IV fluids.  IV antibiotics.  Cipro and Flagyl given penicillin allergy.  Diagnostic laparoscopy with removal of appendix.  I do not see any contraindications to surgery at this time.  There is no abscess.  There is no obstruction.  There is  no perforation.  Symptoms are only less than 23 hours this time around.  Would proceed with appendectomy first.  The patient wishes to be aggressive and proceed with surgery.  She has decent performance status and is functional with no major medical contraindications in my mind.  Daughters agree with surgery as well:  The anatomy & physiology of the digestive tract was discussed.  The pathophysiology of appendicitis and other appendiceal disorders were discussed.  Natural history risks without surgery was discussed.   I feel the risks of no intervention will lead to serious problems that outweigh the operative risks; therefore, I recommended diagnostic laparoscopy with removal of appendix to remove the pathology.  Laparoscopic & open techniques were discussed.   I noted a good likelihood this will help address the problem.   Risks such as bleeding, infection, abscess, leak, reoperation, possible ostomy, hernia, heart attack, stroke, death, and other risks were discussed.  Goals of post-operative recovery were discussed as well.  We will work to minimize complications.  Questions were answered.  The patient expresses understanding & wishes to proceed with  surgery.   Follow-up colonoscopy 6 weeks out from surgery to rule out other intra-abdominal pathology given advanced age and not classically typical presentation.  Diabetic control.  Sliding scale insulin.  Control hypertension.  Send urine for C&S  Follow mental status perioperatively given advanced age.  No evidence of dementia or delirium at this time  VTE prophylaxis- SCDs, etc  -mobilize as tolerated to help recovery    Adin Hector, M.D., F.A.C.S. Gastrointestinal and Minimally Invasive Surgery [facility] [address], [place], [place] [address] [phone] Main / Paging   11/15/2014  Note: Portions of this report may have been transcribed using voice recognition software. Every effort  was made to ensure accuracy; however, inadvertent computerized transcription errors may be present.   Any transcriptional errors that result from this process are unintentional.

## 2014-11-15 NOTE — Transfer of Care (Signed)
Immediate Anesthesia Transfer of Care Note  Patient: Kristine Burnett  Procedure(s) Performed: Procedure(s): APPENDECTOMY LAPAROSCOPIC (N/A)  Patient Location: PACU  Anesthesia Type:General  Level of Consciousness: awake, alert  and oriented  Airway & Oxygen Therapy: Patient Spontanous Breathing and Patient connected to face mask oxygen  Post-op Assessment: Report given to RN and Post -op Vital signs reviewed and stable  Post vital signs: Reviewed and stable  Last Vitals:  Filed Vitals:   11/15/14 1232  BP: 120/77  Pulse: 65  Temp: 36.7 C  Resp: 18    Complications: No apparent anesthesia complications

## 2014-11-15 NOTE — Anesthesia Preprocedure Evaluation (Addendum)
Anesthesia Evaluation  Patient identified by MRN, date of birth, ID band Patient awake    Reviewed: Allergy & Precautions, NPO status , Patient's Chart, lab work & pertinent test results, reviewed documented beta blocker date and time   Airway Mallampati: II  TM Distance: >3 FB Neck ROM: Full    Dental no notable dental hx. (+) Edentulous Upper, Edentulous Lower   Pulmonary asthma , former smoker,  breath sounds clear to auscultation  Pulmonary exam normal       Cardiovascular hypertension, Pt. on medications and Pt. on home beta blockers Normal cardiovascular examRhythm:Regular Rate:Normal + Systolic murmurs    Neuro/Psych negative neurological ROS  negative psych ROS   GI/Hepatic Recurrent appendicitis   Endo/Other  diabetes, Well Controlled, Type 2, Oral Hypoglycemic AgentsHyperlipidemia  Renal/GU Renal InsufficiencyRenal disease  negative genitourinary   Musculoskeletal  (+) Arthritis -, DDD Spinal stenosis   Abdominal (+)  Abdomen: soft and tender.    Peds  Hematology  (+) anemia ,   Anesthesia Other Findings   Reproductive/Obstetrics                            Anesthesia Physical Anesthesia Plan  ASA: III and emergent  Anesthesia Plan: General   Post-op Pain Management:    Induction: Intravenous, Rapid sequence and Cricoid pressure planned  Airway Management Planned: Oral ETT  Additional Equipment:   Intra-op Plan:   Post-operative Plan: Extubation in OR  Informed Consent: I have reviewed the patients History and Physical, chart, labs and discussed the procedure including the risks, benefits and alternatives for the proposed anesthesia with the patient or authorized representative who has indicated his/her understanding and acceptance.   Dental advisory given  Plan Discussed with: CRNA, Anesthesiologist and Surgeon  Anesthesia Plan Comments:          Anesthesia Quick Evaluation

## 2014-11-16 DIAGNOSIS — R338 Other retention of urine: Secondary | ICD-10-CM

## 2014-11-16 DIAGNOSIS — K352 Acute appendicitis with generalized peritonitis: Secondary | ICD-10-CM | POA: Diagnosis not present

## 2014-11-16 LAB — CBC
HCT: 32 % — ABNORMAL LOW (ref 36.0–46.0)
Hemoglobin: 10.8 g/dL — ABNORMAL LOW (ref 12.0–15.0)
MCH: 31.7 pg (ref 26.0–34.0)
MCHC: 33.8 g/dL (ref 30.0–36.0)
MCV: 93.8 fL (ref 78.0–100.0)
PLATELETS: 161 10*3/uL (ref 150–400)
RBC: 3.41 MIL/uL — AB (ref 3.87–5.11)
RDW: 14.2 % (ref 11.5–15.5)
WBC: 8.1 10*3/uL (ref 4.0–10.5)

## 2014-11-16 LAB — BASIC METABOLIC PANEL
Anion gap: 6 (ref 5–15)
BUN: 21 mg/dL — ABNORMAL HIGH (ref 6–20)
CALCIUM: 8.7 mg/dL — AB (ref 8.9–10.3)
CO2: 28 mmol/L (ref 22–32)
CREATININE: 0.85 mg/dL (ref 0.44–1.00)
Chloride: 100 mmol/L — ABNORMAL LOW (ref 101–111)
Glucose, Bld: 122 mg/dL — ABNORMAL HIGH (ref 65–99)
Potassium: 3.3 mmol/L — ABNORMAL LOW (ref 3.5–5.1)
SODIUM: 134 mmol/L — AB (ref 135–145)

## 2014-11-16 LAB — MAGNESIUM: Magnesium: 1.4 mg/dL — ABNORMAL LOW (ref 1.7–2.4)

## 2014-11-16 LAB — GLUCOSE, CAPILLARY
GLUCOSE-CAPILLARY: 153 mg/dL — AB (ref 65–99)
GLUCOSE-CAPILLARY: 119 mg/dL — AB (ref 65–99)
GLUCOSE-CAPILLARY: 148 mg/dL — AB (ref 65–99)
Glucose-Capillary: 108 mg/dL — ABNORMAL HIGH (ref 65–99)

## 2014-11-16 MED ORDER — DILTIAZEM HCL 60 MG PO TABS
120.0000 mg | ORAL_TABLET | Freq: Two times a day (BID) | ORAL | Status: DC
  Administered 2014-11-16 – 2014-11-18 (×4): 120 mg via ORAL
  Filled 2014-11-16 (×6): qty 2

## 2014-11-16 MED ORDER — POTASSIUM CHLORIDE 10 MEQ/100ML IV SOLN
10.0000 meq | INTRAVENOUS | Status: AC
  Administered 2014-11-16 (×4): 10 meq via INTRAVENOUS
  Filled 2014-11-16 (×4): qty 100

## 2014-11-16 MED ORDER — TAB-A-VITE/IRON PO TABS
1.0000 | ORAL_TABLET | Freq: Every day | ORAL | Status: DC
  Administered 2014-11-16 – 2014-11-18 (×3): 1 via ORAL
  Filled 2014-11-16 (×3): qty 1

## 2014-11-16 MED ORDER — ASPIRIN 325 MG PO TABS
325.0000 mg | ORAL_TABLET | Freq: Every day | ORAL | Status: DC
  Administered 2014-11-16 – 2014-11-18 (×3): 325 mg via ORAL
  Filled 2014-11-16 (×3): qty 1

## 2014-11-16 MED ORDER — DILTIAZEM HCL 60 MG PO TABS: 60.0000 mg | ORAL_TABLET | Freq: Two times a day (BID) | ORAL | Status: DC

## 2014-11-16 NOTE — Progress Notes (Signed)
Pt unable to void after trying a couple of times. Bladder scanned a little over 400 cc urine. Inserted foley per order, approx. 430 cc urine drained in bag.

## 2014-11-16 NOTE — Progress Notes (Addendum)
General Surgery Note  LOS: 1 day  POD -  1 Day Post-Op  Assessment/Plan: 1.  APPENDECTOMY LAPAROSCOPIC - 11/15/2014 - [facility]  For recurrent appendicitis  WBC - 8,100 - 11/16/2014  On Cipro/Flagyl   Tolerating po's  2.  Urinary retention  Foley placed around 01:00.  Will d/c foley and see how she does.  She has seen Dr. Junious Silk in the past  3.  DVT prophylaxis - on SQ Heparin 4.  Hypokalemia - K+ - 3.3 - 11/16/2014  Given some IV K+   Principal Problem:   Recurrent appendicitis s/p lap appy 11/15/2014 Active Problems:   CKD stage 2 due to type 2 diabetes mellitus   Diabetes mellitus type 2, controlled   Nausea and vomiting   Malnutrition of moderate degree   Acute urinary retention  Subjective:  Doing well, except that she had trouble voiding.  Family in room. Objective:   Filed Vitals:   11/16/14 0609  BP: 103/47  Pulse: 77  Temp: 99.2 F (37.3 C)  Resp: 16     Intake/Output from previous day:  08/27 0701 - 08/28 0700 In: 2540 [P.O.:540; I.V.:1400; IV Piggyback:600] Out: 675 [Urine:675]  Intake/Output this shift:  Total I/O In: 240 [P.O.:240] Out: -    Physical Exam:   General: WN AA F who is alert and oriented.    HEENT: Normal. Pupils equal. .   Lungs: Clear   Abdomen: Soft   Wound: Clean     Lab Results:    Recent Labs  11/15/14 0458 11/16/14 0532  WBC 2.5* 8.1  HGB 14.0 10.8*  HCT 41.7 32.0*  PLT 154 161    BMET   Recent Labs  11/15/14 0458 11/16/14 0532  NA 136 134*  K 3.4* 3.3*  CL 100* 100*  CO2 28 28  GLUCOSE 131* 122*  BUN 25* 21*  CREATININE 0.81 0.85  CALCIUM 9.7 8.7*    PT/INR  No results for input(s): LABPROT, INR in the last 72 hours.  ABG  No results for input(s): PHART, HCO3 in the last 72 hours.  Invalid input(s): PCO2, PO2   Studies/Results:  Ct Abdomen Pelvis W Contrast  11/15/2014   CLINICAL DATA:  81 year old female with lower abdominal pain and nausea, which began yesterday.  EXAM: CT ABDOMEN AND PELVIS WITH  CONTRAST  TECHNIQUE: Multidetector CT imaging of the abdomen and pelvis was performed using the standard protocol following bolus administration of intravenous contrast.  CONTRAST:  8mL OMNIPAQUE IOHEXOL 300 MG/ML SOLN, 131mL OMNIPAQUE IOHEXOL 300 MG/ML SOLN  COMPARISON:  CT the abdomen and pelvis 03/15/2014.  FINDINGS: Lower chest: Again noted is bronchiectasis and scarring in the right lower lobe where there appears to be chronic volume loss. Mild scarring in the left lower lobe is also similar to the prior study.  Hepatobiliary: Status post cholecystectomy. Moderate intrahepatic biliary ductal dilatation, similar to the prior examination. Common bile duct remains dilated, measuring up to 13 mm in the porta hepatis, similar to the prior examination. No suspicious cystic or solid hepatic lesions.  Pancreas: No pancreatic mass. No pancreatic ductal dilatation. No pancreatic or peripancreatic fluid or inflammatory changes.  Spleen: Unremarkable.  Adrenals/Urinary Tract: Bilateral adrenal glands are normal in appearance. 1 cm simple cyst in the anterior aspect of the interpolar region of the right kidney. Several tiny sub cm low-attenuation lesions are noted in the kidneys bilaterally, too small to definitively characterize, but similar to prior examinations, favored to represent small cysts. No hydroureteronephrosis. Urinary bladder is  normal in appearance.  Stomach/Bowel: The appearance of the stomach is normal. No pathologic dilatation of small bowel or colon. The appendix appears enlarged, measuring in diameter, and there appears to be extensive enhancement of the appendiceal mucosa. Although there is a small amount of periappendiceal fluid, this is indistinguishable from the greater volume of ascites. No discrete periappendiceal abscess is identified at this time.  Vascular/Lymphatic: Extensive atherosclerosis throughout the abdominal and pelvic vasculature, without evidence of aneurysm or dissection. No  lymphadenopathy noted in the abdomen or pelvis.  Reproductive: Uterus and ovaries are atrophic.  Other: Small volume of ascites.  No pneumoperitoneum.  Musculoskeletal: There are no aggressive appearing lytic or blastic lesions noted in the visualized portions of the skeleton.  IMPRESSION: 1. The appendix appears enlarged, and there is avid enhancement of the appendiceal mucosa, which could be indicative of recurrent acute appendicitis. There is a small volume of ascites. However, there is no definite periappendiceal abscess noted at this time. 2. No other definite acute findings identified in the abdomen or pelvis. 3. Status post cholecystectomy. Chronic intra and extrahepatic biliary ductal dilatation, similar to the prior examination, presumably related to post cholecystectomy physiology. 4. Extensive atherosclerosis. 5. Additional incidental findings, as above, similar to prior examinations. These results were called by telephone at the time of interpretation on 11/15/2014 at 7:26 am to Dr. Linton Flemings, who verbally acknowledged these results.   Electronically Signed   By: Vinnie Langton M.D.   On: 11/15/2014 07:27     Anti-infectives:   Anti-infectives    Start     Dose/Rate Route Frequency Ordered Stop   11/15/14 1443  clindamycin (CLEOCIN) 900 mg, gentamicin (GARAMYCIN) 240 mg in sodium chloride 0.9 % 1,000 mL for intraperitoneal lavage  Status:  Discontinued       As needed 11/15/14 1443 11/15/14 1522   11/15/14 1000  metroNIDAZOLE (FLAGYL) IVPB 500 mg     500 mg 100 mL/hr over 60 Minutes Intravenous Every 8 hours 11/15/14 0818     11/15/14 0900  ciprofloxacin (CIPRO) IVPB 400 mg     400 mg 200 mL/hr over 60 Minutes Intravenous Every 12 hours 11/15/14 0818     11/15/14 0830  clindamycin (CLEOCIN) 900 mg, gentamicin (GARAMYCIN) 240 mg in sodium chloride 0.9 % 1,000 mL for intraperitoneal lavage  Status:  Discontinued    Comments:  Pharmacy may adjust dosing strength, schedule, rate of  infusion, etc as needed to optimize therapy    Intraperitoneal To Surgery 11/15/14 0819 11/15/14 1611      Alphonsa Overall, MD, FACS Pager: [facility] Office: [phone] 11/16/2014

## 2014-11-16 NOTE — Progress Notes (Signed)
Foley catheter d/c'd intact. Pt up to bathroom with walker to void. Perineal self care done by patient.

## 2014-11-17 ENCOUNTER — Encounter (HOSPITAL_COMMUNITY): Payer: Self-pay | Admitting: Surgery

## 2014-11-17 LAB — URINE CULTURE: Special Requests: NORMAL

## 2014-11-17 LAB — CBC
HCT: 29.7 % — ABNORMAL LOW (ref 36.0–46.0)
HEMOGLOBIN: 10.1 g/dL — AB (ref 12.0–15.0)
MCH: 31.9 pg (ref 26.0–34.0)
MCHC: 34 g/dL (ref 30.0–36.0)
MCV: 93.7 fL (ref 78.0–100.0)
PLATELETS: 156 10*3/uL (ref 150–400)
RBC: 3.17 MIL/uL — AB (ref 3.87–5.11)
RDW: 14.4 % (ref 11.5–15.5)
WBC: 9.4 10*3/uL (ref 4.0–10.5)

## 2014-11-17 LAB — CREATININE, SERUM
CREATININE: 0.99 mg/dL (ref 0.44–1.00)
GFR calc Af Amer: 59 mL/min — ABNORMAL LOW (ref >60)
GFR, EST NON AFRICAN AMERICAN: 51 mL/min — AB (ref >60)

## 2014-11-17 LAB — HEMOGLOBIN A1C
Hgb A1c MFr Bld: 6 % — ABNORMAL HIGH (ref 4.8–5.6)
MEAN PLASMA GLUCOSE: 126 mg/dL

## 2014-11-17 LAB — POTASSIUM: POTASSIUM: 3.5 mmol/L (ref 3.5–5.1)

## 2014-11-17 LAB — GLUCOSE, CAPILLARY
GLUCOSE-CAPILLARY: 114 mg/dL — AB (ref 65–99)
Glucose-Capillary: 205 mg/dL — ABNORMAL HIGH (ref 65–99)
Glucose-Capillary: 157 mg/dL — ABNORMAL HIGH (ref 65–99)

## 2014-11-17 NOTE — Care Management Important Message (Signed)
Important Message  Patient Details  Name: Kristine Burnett MRN: [id number] Date of Birth: February 16, 1930   Medicare Important Message Given:  Yes-second notification given    Camillo Flaming 11/17/2014, 12:24 [place] Message  Patient Details  Name: Kristine Burnett MRN: [id number] Date of Birth: 08-22-1929   Medicare Important Message Given:  Yes-second notification given    Camillo Flaming 11/17/2014, 12:23 PM

## 2014-11-17 NOTE — Progress Notes (Signed)
Patient ID: Kristine Burnett, female   DOB: 06-11-1929, 81 y.o.   MRN: [id number]     [facility]      [address] [place], [place] [address]    Phone: [phone] FAX: [phone]     Subjective: Temp max 99.2.  No chills or sweats.  No dysuria.  Walking.  No flatus.  No n/v.  Tolerating fulls.   Objective:  Vital signs:  Filed Vitals:   11/16/14 1228 11/16/14 1546 11/16/14 2130 11/17/14 0445  BP: 124/57 151/67 117/70 127/61  Pulse: 77 80 73 82  Temp: 99.1 F (37.3 C) 99 F (37.2 C) 97.9 F (36.6 C) 98.9 F (37.2 C)  TempSrc: Oral Oral Oral Oral  Resp: '18 18 18 16  ' Height:      Weight:      SpO2: 100% 97% 100% 100%    Last BM Date: 11/14/14  Intake/Output   Yesterday:  08/28 0701 - 08/29 0700 In: 1300 [P.O.:600; IV Piggyback:700] Out: 425 [Urine:425] This shift:    I/O last 3 completed shifts: In: 2340 [P.O.:1140; IV Piggyback:1200] Out: 750 [Urine:750]    Physical Exam: General: Pt awake/alert/oriented x4 in no acute distress Chest: cta. No chest wall pain w good excursion CV:  Pulses intact.  Regular rhythm Abdomen: Soft.  Nondistended.   Mildly tender at incisions only.  Incisions are c/d/i.  No evidence of peritonitis.  No incarcerated hernias. Ext:  SCDs BLE.  No mjr edema.  No cyanosis Skin: No petechiae / purpura   Problem List:   Principal Problem:   Recurrent appendicitis s/p lap appy 11/15/2014 Active Problems:   CKD stage 2 due to type 2 diabetes mellitus   Diabetes mellitus type 2, controlled   Nausea and vomiting   Malnutrition of moderate degree   Acute urinary retention    Results:   Labs: Results for orders placed or performed during the hospital encounter of 11/15/14 (from the past 48 hour(s))  Glucose, capillary     Status: Abnormal   Collection Time: 11/15/14 12:09 PM  Result Value Ref Range   Glucose-Capillary 144 (H) 65 - 99 mg/dL  Surgical pcr screen     Status: Abnormal    Collection Time: 11/15/14 12:40 PM  Result Value Ref Range   MRSA, PCR NEGATIVE NEGATIVE   Staphylococcus aureus POSITIVE (A) NEGATIVE    Comment:        The Xpert SA Assay (FDA approved for NASAL specimens in patients over 1 years of age), is one component of a comprehensive surveillance program.  Test performance has been validated by [facility] for patients greater than or equal to 31 year old. It is not intended to diagnose infection nor to guide or monitor treatment.   Glucose, capillary     Status: Abnormal   Collection Time: 11/15/14  5:46 PM  Result Value Ref Range   Glucose-Capillary 154 (H) 65 - 99 mg/dL  Glucose, capillary     Status: Abnormal   Collection Time: 11/15/14 11:00 PM  Result Value Ref Range   Glucose-Capillary 193 (H) 65 - 99 mg/dL  Basic metabolic panel     Status: Abnormal   Collection Time: 11/16/14  5:32 AM  Result Value Ref Range   Sodium 134 (L) 135 - 145 mmol/L   Potassium 3.3 (L) 3.5 - 5.1 mmol/L   Chloride 100 (L) 101 - 111 mmol/L   CO2 28 22 - 32 mmol/L   Glucose, Bld 122 (  H) 65 - 99 mg/dL   BUN 21 (H) 6 - 20 mg/dL   Creatinine, Ser 0.85 0.44 - 1.00 mg/dL   Calcium 8.7 (L) 8.9 - 10.3 mg/dL   GFR calc non Af Amer >60 >60 mL/min   GFR calc Af Amer >60 >60 mL/min    Comment: (NOTE) The eGFR has been calculated using the CKD EPI equation. This calculation has not been validated in all clinical situations. eGFR's persistently <60 mL/min signify possible Chronic Kidney Disease.    Anion gap 6 5 - 15  CBC     Status: Abnormal   Collection Time: 11/16/14  5:32 AM  Result Value Ref Range   WBC 8.1 4.0 - 10.5 K/uL   RBC 3.41 (L) 3.87 - 5.11 MIL/uL   Hemoglobin 10.8 (L) 12.0 - 15.0 g/dL    Comment: DELTA CHECK NOTED REPEATED TO VERIFY    HCT 32.0 (L) 36.0 - 46.0 %   MCV 93.8 78.0 - 100.0 fL   MCH 31.7 26.0 - 34.0 pg   MCHC 33.8 30.0 - 36.0 g/dL   RDW 14.2 11.5 - 15.5 %   Platelets 161 150 - 400 K/uL  Magnesium     Status: Abnormal    Collection Time: 11/16/14  5:32 AM  Result Value Ref Range   Magnesium 1.4 (L) 1.7 - 2.4 mg/dL  Glucose, capillary     Status: Abnormal   Collection Time: 11/16/14  8:38 AM  Result Value Ref Range   Glucose-Capillary 153 (H) 65 - 99 mg/dL  Glucose, capillary     Status: Abnormal   Collection Time: 11/16/14 12:24 PM  Result Value Ref Range   Glucose-Capillary 108 (H) 65 - 99 mg/dL  Glucose, capillary     Status: Abnormal   Collection Time: 11/16/14  5:39 PM  Result Value Ref Range   Glucose-Capillary 119 (H) 65 - 99 mg/dL  Glucose, capillary     Status: Abnormal   Collection Time: 11/16/14  9:15 PM  Result Value Ref Range   Glucose-Capillary 148 (H) 65 - 99 mg/dL  CBC     Status: Abnormal   Collection Time: 11/17/14  5:15 AM  Result Value Ref Range   WBC 9.4 4.0 - 10.5 K/uL   RBC 3.17 (L) 3.87 - 5.11 MIL/uL   Hemoglobin 10.1 (L) 12.0 - 15.0 g/dL   HCT 29.7 (L) 36.0 - 46.0 %   MCV 93.7 78.0 - 100.0 fL   MCH 31.9 26.0 - 34.0 pg   MCHC 34.0 30.0 - 36.0 g/dL   RDW 14.4 11.5 - 15.5 %   Platelets 156 150 - 400 K/uL  Creatinine, serum     Status: Abnormal   Collection Time: 11/17/14  5:15 AM  Result Value Ref Range   Creatinine, Ser 0.99 0.44 - 1.00 mg/dL   GFR calc non Af Amer 51 (L) >60 mL/min   GFR calc Af Amer 59 (L) >60 mL/min    Comment: (NOTE) The eGFR has been calculated using the CKD EPI equation. This calculation has not been validated in all clinical situations. eGFR's persistently <60 mL/min signify possible Chronic Kidney Disease.   Potassium     Status: None   Collection Time: 11/17/14  5:15 AM  Result Value Ref Range   Potassium 3.5 3.5 - 5.1 mmol/L    Imaging / Studies: No results found.  Medications / Allergies:  Scheduled Meds: . aspirin  325 mg Oral Daily  . ciprofloxacin  400 mg Intravenous Q12H  And  . metronidazole  500 mg Intravenous Q8H  . diltiazem  120 mg Oral Q12H  . enalapril  10 mg Oral Daily  . fenofibrate  160 mg Oral Daily  .  heparin  5,000 Units Subcutaneous 3 times per day  . insulin aspart  0-5 Units Subcutaneous QHS  . insulin aspart  0-9 Units Subcutaneous TID WC  . lip balm  1 application Topical BID  . multivitamins with iron  1 tablet Oral Daily  . oxybutynin  5 mg Oral BID  . saccharomyces boulardii  250 mg Oral BID  . sodium chloride  3 mL Intravenous Q12H  . vitamin C  500 mg Oral BID   Continuous Infusions:  PRN Meds:.sodium chloride, acetaminophen **OR** acetaminophen, alum & mag hydroxide-simeth, bisacodyl, diphenhydrAMINE, HYDROcodone-acetaminophen, HYDROmorphone (DILAUDID) injection, lactated ringers, lactated ringers, magic mouthwash, menthol-cetylpyridinium, metoprolol, metoprolol tartrate, ondansetron (ZOFRAN) IV **OR** ondansetron (ZOFRAN) IV, ondansetron, phenol, promethazine, sodium chloride  Antibiotics: Anti-infectives    Start     Dose/Rate Route Frequency Ordered Stop   11/15/14 1443  clindamycin (CLEOCIN) 900 mg, gentamicin (GARAMYCIN) 240 mg in sodium chloride 0.9 % 1,000 mL for intraperitoneal lavage  Status:  Discontinued       As needed 11/15/14 1443 11/15/14 1522   11/15/14 1000  metroNIDAZOLE (FLAGYL) IVPB 500 mg     500 mg 100 mL/hr over 60 Minutes Intravenous Every 8 hours 11/15/14 0818     11/15/14 0900  ciprofloxacin (CIPRO) IVPB 400 mg     400 mg 200 mL/hr over 60 Minutes Intravenous Every 12 hours 11/15/14 0818     11/15/14 0830  clindamycin (CLEOCIN) 900 mg, gentamicin (GARAMYCIN) 240 mg in sodium chloride 0.9 % 1,000 mL for intraperitoneal lavage  Status:  Discontinued    Comments:  Pharmacy may adjust dosing strength, schedule, rate of infusion, etc as needed to optimize therapy    Intraperitoneal To Surgery 11/15/14 0819 11/15/14 1611        Assessment/Plan Recurrent appendicitis  POD#2 laparoscopic appendectomy---Dr. Johney Maine -advance to soft diet -continue mobilization(walked 3x this AM) -pulmonary toilet ID-cipro/flagyl continue IV for additional 24h,  transition to PO tomorrow for total of 7D DM II-CBGs/SSI.  Resume metformin at DC HTN-stable.  Resume atenolol then HCTZ if BP up Urinary retention-resolved  FEN-advance diet.  SLIV Dispo-DC in AM if able to tolerate POs and remains afebrile.    Erby Pian, [facility] Surgery Pager [phone]([id number])   11/17/2014 7:53 AM

## 2014-11-18 LAB — GLUCOSE, CAPILLARY: GLUCOSE-CAPILLARY: 109 mg/dL — AB (ref 65–99)

## 2014-11-18 MED ORDER — METRONIDAZOLE 500 MG PO TABS: 500.0000 mg | ORAL_TABLET | Freq: Three times a day (TID) | ORAL | Status: DC

## 2014-11-18 MED ORDER — CIPROFLOXACIN HCL 500 MG PO TABS: 500.0000 mg | ORAL_TABLET | Freq: Two times a day (BID) | ORAL | Status: DC

## 2014-11-18 NOTE — Discharge Summary (Signed)
Physician Discharge Summary  Patient ID: Kristine Burnett MRN: [id number] DOB/AGE: 04-19-1929 81 y.o.  Admit date: 11/15/2014 Discharge date: 11/18/2014  Admitting Diagnosis:  Recurrent appendicitis   Discharge Diagnosis Patient Active Problem List   Diagnosis Date Noted  . Acute urinary retention 11/16/2014  . Recurrent appendicitis s/p lap appy 11/15/2014 11/15/2014  . Hypomagnesemia 03/24/2014  . Malnutrition of moderate degree 03/19/2014  . Nausea and vomiting   . Hypokalemia   . Diabetes mellitus type 2, controlled 02/27/2014  . CKD stage 2 due to type 2 diabetes mellitus 12/25/2013  . Toe fracture, right 11/28/2013  . Encounter for long-term (current) use of medications 09/17/2013  . Essential hypertension 03/22/2013  . Hyperlipidemia 03/22/2013  . Vitamin D deficiency 03/22/2013  . Extrinsic asthma, unspecified 03/22/2013    Consultants none  Imaging: No results found.  Procedures laparoscopic appendectomy---Dr. Radene Ou Course:  Kristine Burnett was found to have evidence of perforation and peritonitis in the right lower quadrant and December 2015. Placed on IV antibiotics and percutaneous drainage of the abscess and subsequently discharged home.  She was readmitted for a small bowel obstruction.  Abscess had resolved and the drain was removed.  She was discharged to follow up on outpatient basis for an interval appendectomy, however, lost in follow up.  She came [facility] to [facility] with recurrent abdominal pain.  CT showed thickening and inflammation of the appendix.  Patient was admitted and underwent procedure listed above.  Tolerated procedure well and was transferred to the floor.  Diet was advanced as tolerated.  Kept on IV antibiotics.  She was kept on On POD#2, the patient was voiding well, tolerating diet, ambulating well, pain well controlled, vital signs stable, incisions c/d/i and felt stable for discharge home.  Medication risks, benefits and therapeutic  alternatives were reviewed with the patient.  She/he verbalizes understanding.  She was given additional 4 days of antibiotics.  Patient will follow up in our office in 2 weeks and knows to call with questions or concerns.  Physical Exam: General:  Alert, NAD, pleasant, comfortable Abd:  Soft, ND, mild tenderness, incisions C/D/I.    Medication List    TAKE these medications        acetaminophen 325 MG tablet  Commonly known as:  TYLENOL  Take 2 tablets (650 mg total) by mouth every 6 (six) hours as needed for mild pain (or Fever >/= 101).     alendronate 70 MG tablet  Commonly known as:  FOSAMAX  Take 1 tablet (70 mg total) by mouth once a week. Take with a full glass of water on an empty stomach.     ALEVE 220 MG tablet  Generic drug:  naproxen sodium  Take 440-660 mg by mouth 2 (two) times daily as needed (pain).     aspirin 325 MG tablet  Take 325 mg by mouth daily.     atenolol 100 MG tablet  Commonly known as:  TENORMIN  TAKE 1 TABLET BY MOUTH DAILY FOR BLOOD PRESSURE     ciprofloxacin 500 MG tablet  Commonly known as:  CIPRO  Take 1 tablet (500 mg total) by mouth 2 (two) times daily.     Coenzyme Q10 200 MG capsule  Take 200 mg by mouth daily.     diltiazem 120 MG tablet  Commonly known as:  CARDIZEM  TAKE 1 TABLET BY MOUTH TWICE DAILY FOR HIGH BLOOD PRESSURE     fenofibrate 160 MG tablet  Take 160 mg by mouth  daily.     Flaxseed Oil 1000 MG Caps  Take 1,000 mg by mouth 4 (four) times daily.     FLUZONE HIGH-DOSE 0.5 ML Susy  Generic drug:  Influenza Vac Split High-Dose     freestyle lancets  Test glucose 1 time daily     FREESTYLE LITE test strip  Generic drug:  glucose blood  1 each by Other route daily.     hydrochlorothiazide 25 MG tablet  Commonly known as:  HYDRODIURIL  TAKE 1 TABLET BY MOUTH EVERY MORNING     HYDROcodone-acetaminophen 5-325 MG per tablet  Commonly known as:  NORCO/VICODIN  Take 1-2 tablets by mouth every 6 (six) hours as  needed for moderate pain or severe pain.     MAG-OX PO  Take 500 mg by mouth daily.     metroNIDAZOLE 500 MG tablet  Commonly known as:  FLAGYL  Take 1 tablet (500 mg total) by mouth 3 (three) times daily.     MULTIVITAMIN & MINERAL PO  Take by mouth daily.     oxybutynin 5 MG tablet  Commonly known as:  DITROPAN  TAKE 1 TABLET BY MOUTH TWICE DAILY FOR BLADDER     simvastatin 40 MG tablet  Commonly known as:  ZOCOR  TAKE 1 TABLET BY MOUTH EVERY NIGHT AT BEDTIME FOR CHOLESTEROL     Vitamin B-12 2500 MCG Subl  Place 5,000 tablets under the tongue daily. Thursdays     vitamin C 500 MG tablet  Commonly known as:  ASCORBIC ACID  Take 500 mg by mouth 2 (two) times daily.     Vitamin D-3 1000 UNITS Caps  Take 2,000 Units by mouth daily.             Follow-up Information    Follow up with [facility]. Schedule an appointment as soon as possible for a visit on 12/08/2014.   Specialty:  General Surgery   Why:  arrive by 2PM for a 2:30PM appointment. To follow up after your operation, To follow up after your hospital stay   Contact information:   [place] [address] [phone]       Signed: Erby Pian, [facility] Surgery [phone]  11/18/2014, 7:50 AM

## 2014-11-18 NOTE — Progress Notes (Signed)
Nursing Discharge Summary  Patient ID: AVYN ADEN MRN: [id number] DOB/AGE: 79-Aug-1931 79 y.o.  Admit date: 11/15/2014 Discharge date: 11/18/2014  Discharged Condition: good  Disposition: 06-Home-Health Care Svc  Follow-up Information    Follow up with [facility]. Schedule an appointment as soon as possible for a visit on 12/08/2014.   Specialty:  General Surgery   Why:  arrive by 2PM for a 2:30PM appointment. To follow up after your operation, To follow up after your hospital stay   Contact information:   [address] [place] [address] [phone]       Prescriptions Given: Prescriptions given for Cipro, Flagyl, and Vicodin. Patient medications and follow up appointments discussed. Patient verbalized understanding without further questions  Means of Discharge: patient will be transported home via private vehicle with daughter.   Signed: Buel Ream 11/18/2014, 12:00 PM

## 2014-12-11 ENCOUNTER — Encounter: Payer: Self-pay | Admitting: Gastroenterology

## 2015-01-09 ENCOUNTER — Other Ambulatory Visit: Payer: Self-pay | Admitting: Internal Medicine

## 2015-01-30 ENCOUNTER — Ambulatory Visit (INDEPENDENT_AMBULATORY_CARE_PROVIDER_SITE_OTHER): Payer: Medicare HMO | Admitting: Gastroenterology

## 2015-01-30 ENCOUNTER — Encounter: Payer: Self-pay | Admitting: Gastroenterology

## 2015-01-30 VITALS — BP 180/82 | HR 68 | Ht 66.0 in | Wt 146.8 lb

## 2015-01-30 DIAGNOSIS — K639 Disease of intestine, unspecified: Secondary | ICD-10-CM | POA: Diagnosis not present

## 2015-01-30 DIAGNOSIS — K3580 Unspecified acute appendicitis: Secondary | ICD-10-CM | POA: Diagnosis not present

## 2015-01-30 NOTE — Progress Notes (Signed)
HPI :  81 y/o female seen in consultation for abnormal appearing colon following recent surgery from Dr. Johney Maine. She has had a prior hospitalization for a perforated appendix last December, was discharged but lost to follow up, came back with recurrent appendicitis in August. She underwent appendectomy, noted to have "cecal wall inflammation" and her surgeons have recommend a colonoscopy to ensure no primary colonic pathology.   She has recovered from her appednectomy well. She has not had any problems with her bowels since the procedure. She previously had diarrhea related to metformin and has since stopped it. She reports her diarrhea had resolved. She reports normal bowel habits since doing this. No blood in the stools. No abdominal pains. She overall feels well. She has spinal stenosis which she states is her main complaint. Uses a walker to ambulate.  Last colonoscopy in 05/2008 was normal other than melanosis colis. No polyps. Sister had colon cancer, diagnosed at age 70  She has no history of CVA or MI. Takes regular 325mg  aspirin daily although she is not sure why she takes this dosing. She also takes aleve every day for back pains  .     Past Medical History  Diagnosis Date  . Hypertension   . Hyperlipidemia   . Diabetes mellitus without complication ([place])   . Vitamin D deficiency   . Asthma   . DDD (degenerative disc disease)   . Spinal stenosis   . Type II or unspecified type diabetes mellitus without mention of complication, not stated as uncontrolled 03/22/2013  . Right lower quadrant abdominal abscess ([place])   . SBO (small bowel obstruction) ([place]) 03/15/2014     Past Surgical History  Procedure Laterality Date  . Hernia repair    . Cyst excision  back  . Cholecystectomy    . Mini-laparotomy w/ tubal ligation  1964  . Laparoscopic appendectomy N/A 11/15/2014    Procedure: APPENDECTOMY LAPAROSCOPIC;  Surgeon: Michael Boston, MD;  Location: [facility];  Service: General;   Laterality: N/A;   Family History  Problem Relation Age of Onset  . Heart attack Mother   . Colon cancer Sister   . Hypertension Mother   . Heart attack Father   . Stroke Sister   . Breast cancer Sister   . Heart attack Brother     x 2   Social History  Substance Use Topics  . Smoking status: Never Smoker   . Smokeless tobacco: None  . Alcohol Use: No   Current Outpatient Prescriptions  Medication Sig Dispense Refill  . acetaminophen (TYLENOL) 325 MG tablet Take 2 tablets (650 mg total) by mouth every 6 (six) hours as needed for mild pain (or Fever >/= 101).    Marland Kitchen aspirin 325 MG tablet Take 325 mg by mouth daily.    Marland Kitchen atenolol (TENORMIN) 100 MG tablet TAKE 1 TABLET BY MOUTH DAILY FOR BLOOD PRESSURE 90 tablet 1  . Cholecalciferol (VITAMIN D-3) 1000 UNITS CAPS Take 2,000 Units by mouth daily.     . Coenzyme Q10 200 MG capsule Take 200 mg by mouth daily.    . Cyanocobalamin (VITAMIN B-12) 2500 MCG SUBL Place 5,000 tablets under the tongue daily. Thursdays    . diltiazem (CARDIZEM) 120 MG tablet TAKE 1 TABLET BY MOUTH TWICE DAILY FOR HIGH BLOOD PRESSURE 180 tablet 98  . fenofibrate 160 MG tablet Take 160 mg by mouth daily.     . Flaxseed, Linseed, (FLAXSEED OIL) 1000 MG CAPS Take 1,000 mg by mouth 4 (  four) times daily.    Marland Kitchen FLUZONE HIGH-DOSE 0.5 ML SUSY     . FREESTYLE LITE test strip 1 each by Other route daily.     . hydrochlorothiazide (HYDRODIURIL) 25 MG tablet TAKE 1 TABLET BY MOUTH EVERY MORNING 90 tablet 97  . Lancets (FREESTYLE) lancets Test glucose 1 time daily 100 each 1  . Magnesium Oxide (MAG-OX PO) Take 500 mg by mouth daily.     . Multiple Vitamins-Minerals (MULTIVITAMIN & MINERAL PO) Take by mouth daily.    . naproxen sodium (ALEVE) 220 MG tablet Take 440-660 mg by mouth 2 (two) times daily as needed (pain).     . simvastatin (ZOCOR) 40 MG tablet TAKE 1 TABLET BY MOUTH EVERY NIGHT AT BEDTIME FOR CHOLESTEROL 90 tablet 98   No current facility-administered medications  for this visit.   Allergies  Allergen Reactions  . Ace Inhibitors Cough    Enalapril OK  . Crestor [Rosuvastatin] Other (See Comments)    Legs cramp   . Metformin And Related Diarrhea  . Penicillins Other (See Comments)    unknown  . Grapefruit Extract Rash  . Minoxidil Other (See Comments)    Grow hair on face     Review of Systems: All systems reviewed and negative except where noted in HPI.   Lab Results  Component Value Date   WBC 9.4 11/17/2014   HGB 10.1* 11/17/2014   HCT 29.7* 11/17/2014   MCV 93.7 11/17/2014   PLT 156 11/17/2014    Lab Results  Component Value Date   CREATININE 0.99 11/17/2014   BUN 21* 11/16/2014   NA 134* 11/16/2014   K 3.5 11/17/2014   CL 100* 11/16/2014   CO2 28 11/16/2014    Lab Results  Component Value Date   ALT 16 11/15/2014   AST 34 11/15/2014   ALKPHOS 37* 11/15/2014   BILITOT 1.0 11/15/2014     Physical Exam: BP 180/82 mmHg  Pulse 68  Ht 5\' 6"  (1.676 m)  Wt 146 lb 12.8 oz (66.588 kg)  BMI 23.71 kg/m2 Constitutional: Pleasant,well-developed, female in no acute distress, with walker HEENT: Normocephalic and atraumatic. Conjunctivae are normal. No scleral icterus. Neck supple.  Cardiovascular: Normal rate, regular rhythm.  Pulmonary/chest: Effort normal and breath sounds normal. No wheezing, rales or rhonchi. Abdominal: Soft, nondistended, nontender. Bowel sounds active throughout. There are no masses palpable.  Extremities: no edema Lymphadenopathy: No cervical adenopathy noted. Neurological: Alert and oriented to person place and time. Skin: Skin is warm and dry. No rashes noted. Psychiatric: Normal mood and affect. Behavior is normal.   ASSESSMENT AND PLAN: 81 y/o female with history of recurrent appendicitis s/p appendectomy, with a reported abnormal appearing cecum during the surgery although I don't have details of its appearance. She was referred for a colonoscopy to ensure a normal cecum. She last had a  colonoscopy in 2010 which was normal without polyps, and for colon cancer screening she was not planning on having another colonoscopy given her age. At this time she otherwise feels well and wanted to avoid a colonoscopy it at all possible. I told her I would discuss her case with Dr. Johney Maine to get a better understanding of what was seen, and get back to her. If there is a concern for possible malignancy, etc, she said she would agree to it if needed. I will get back to her about this.   Otherwise, the patient is taking aspirin 325mg  daily along with Aleve daily for back pain. Recommend  she stop Aleve if she needs to take aspirin daily for prophylaxis. It is unclear to me her indication for 325mg  dosing of aspirin daily, she will discuss with PCM to see if she can safely lower to 81mg . Otherwise if she needs high dose aspirin  I offered her PPI prophylaxis. She prefers to go to low dose aspirin, will confirm if okay with PCM, and avoid PPI prophylaxis if possible. Emlenton Cellar, MD [facility] Pager [phone]

## 2015-01-30 NOTE — Patient Instructions (Signed)
Thank you for choosing [facility] to care for you.  

## 2015-02-05 ENCOUNTER — Telehealth: Payer: Self-pay | Admitting: Gastroenterology

## 2015-02-05 NOTE — Telephone Encounter (Signed)
Called patient. I heard back from Dr. Johney Maine regarding her surgical findings. She had some nonspecific inflammatory changes in the cecum. It did not appear c/w malignancy, but they were thinking a follow up colonoscopy to help rule out any chronic changes in the area. I discussed this with the patient - unclear what this represents, potentially just inflammatory changes related to the appendicitis. She reports she feels great without any symptoms and did not want a colonoscopy at this time. I asked her to follow up with me in 6 months for reassessment, or if she has symptoms in the interim or changes her mind and wants to have the colonoscopy I asked her to call me. She agreed and verbalized understanding.

## 2015-08-26 ENCOUNTER — Other Ambulatory Visit: Payer: Self-pay | Admitting: Internal Medicine

## 2015-08-26 DIAGNOSIS — Z1231 Encounter for screening mammogram for malignant neoplasm of breast: Secondary | ICD-10-CM

## 2015-09-07 ENCOUNTER — Ambulatory Visit
Admission: RE | Admit: 2015-09-07 | Discharge: 2015-09-07 | Disposition: A | Discharge status: Home / Self Care | Payer: Medicare HMO | Source: Ambulatory Visit | Attending: Internal Medicine | Admitting: Internal Medicine

## 2015-09-07 DIAGNOSIS — Z1231 Encounter for screening mammogram for malignant neoplasm of breast: Secondary | ICD-10-CM

## 2016-01-01 IMAGING — CT CT ABD-PELV W/ CM
2 of 5 series · 15 of 46 positions shown, 17 images · IV contrast (OMNIPAQUE 300)
Comparison: CT the abdomen and pelvis 03/15/2014.

CLINICAL DATA: 85-year-old female with lower abdominal pain and
nausea, which began yesterday.

EXAM:
CT ABDOMEN AND PELVIS WITH CONTRAST
TECHNIQUE: Multidetector CT imaging of the abdomen and pelvis was performed
using the standard protocol following bolus administration of
intravenous contrast.
CONTRAST:  50mL OMNIPAQUE IOHEXOL 300 MG/ML SOLN, 100mL OMNIPAQUE
IOHEXOL 300 MG/ML SOLN

[Series 2: abd/pel with · axial · 0.74mm/px · z∈[-402,+3]mm · 12 of 91 slices shown, 14 images]
[im 5/91  soft-tissue]
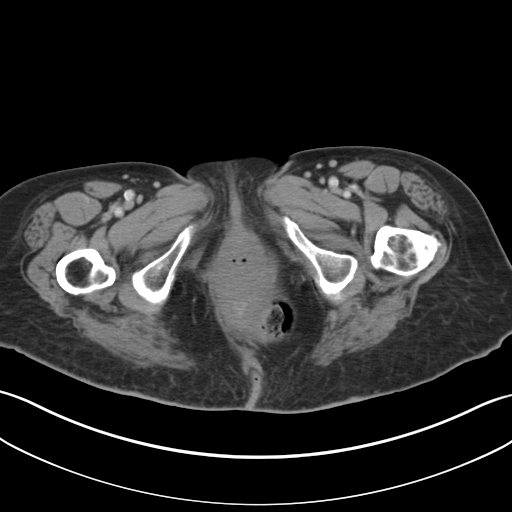
[im 5/91  bone]
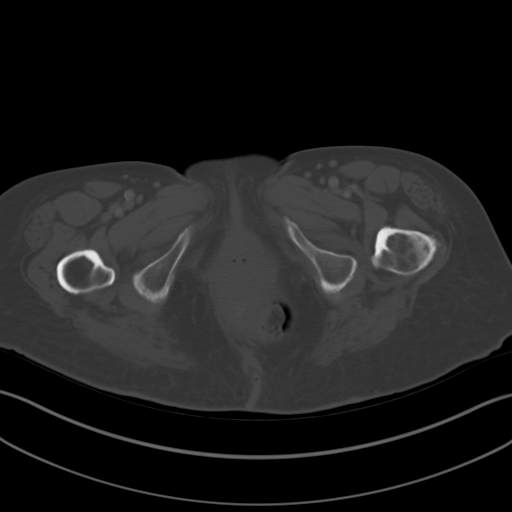
[im 15/91  soft-tissue]
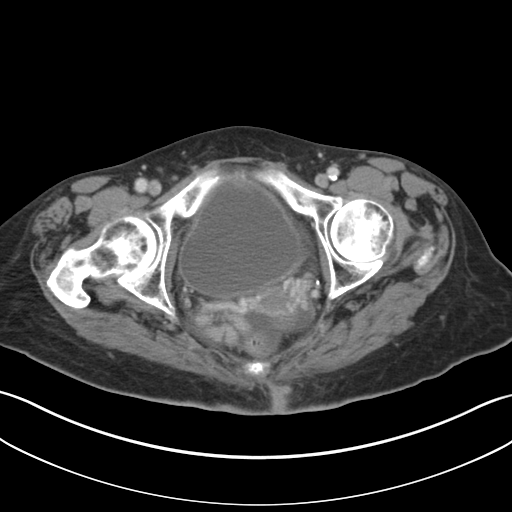
[im 19/91  soft-tissue]
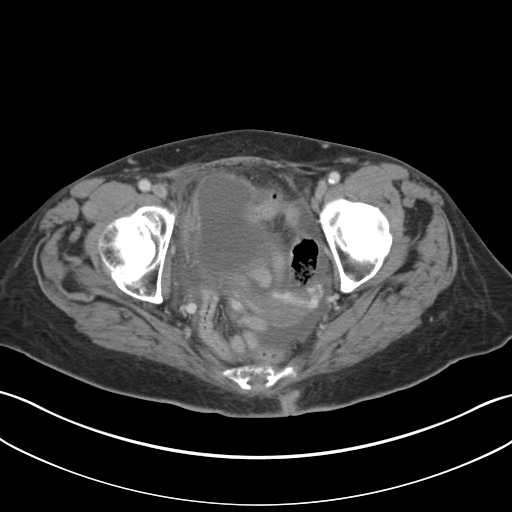
[im 29/91  soft-tissue]
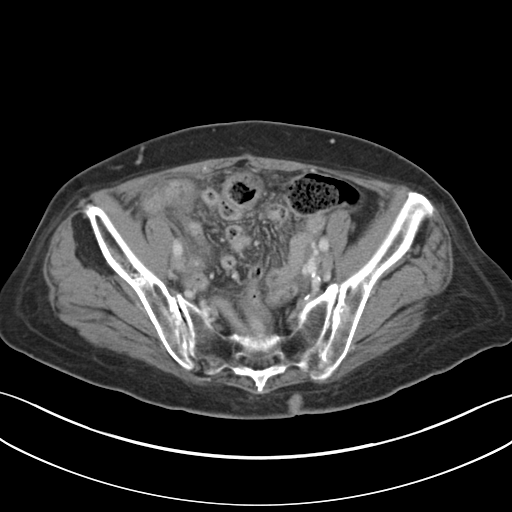
[im 34/91  soft-tissue]
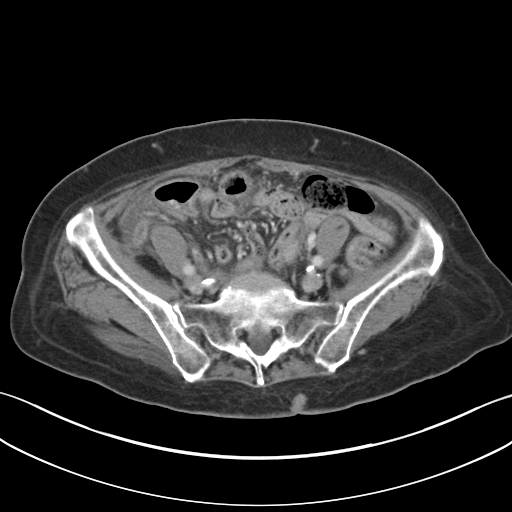
[im 43/91  soft-tissue]
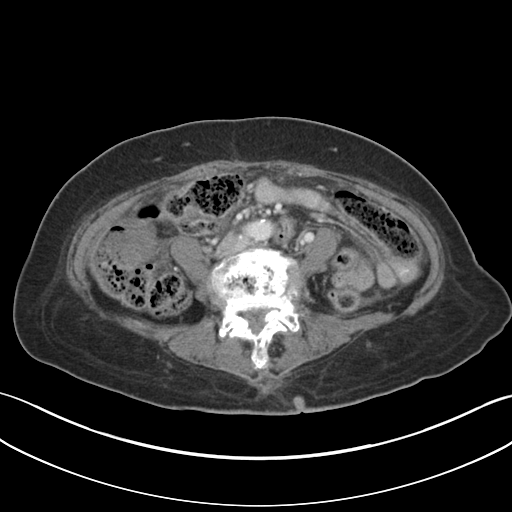
[im 48/91  soft-tissue]
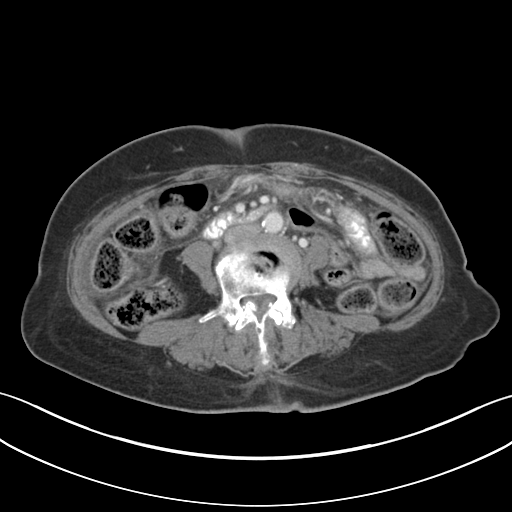
[im 57/91  soft-tissue]
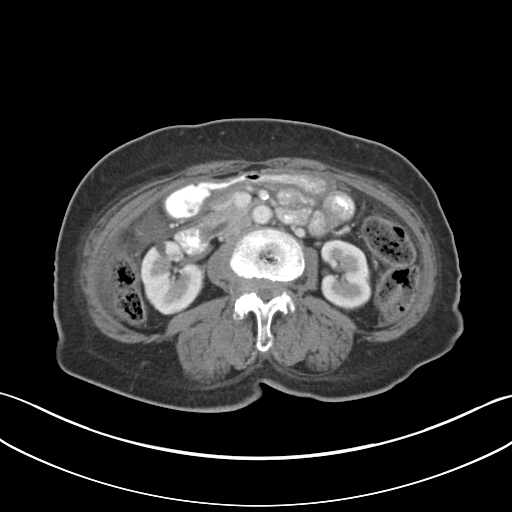
[im 62/91  soft-tissue]
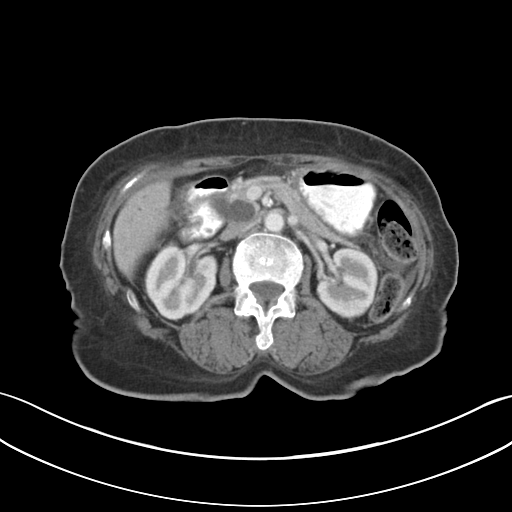
[im 62/91  bone]
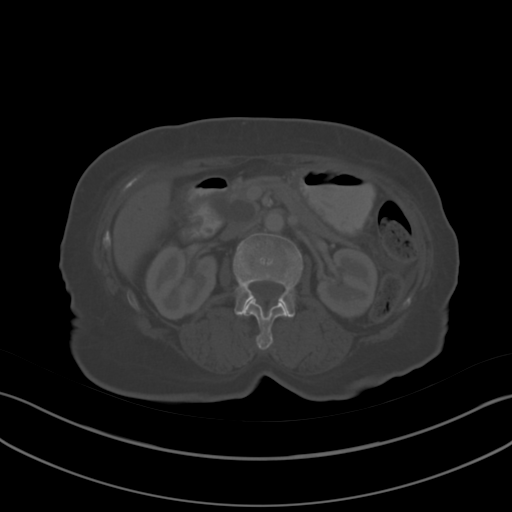
[im 72/91  soft-tissue]
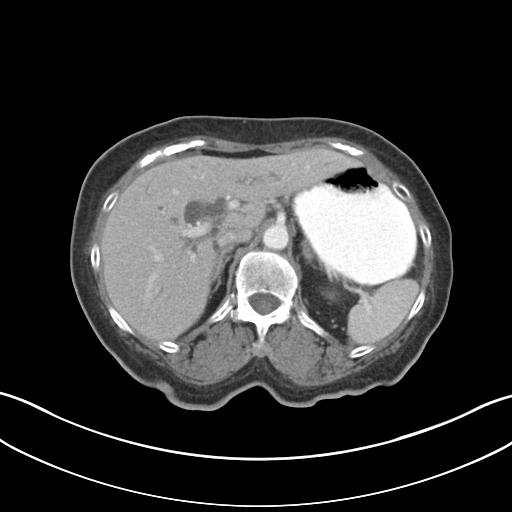
[im 76/91  soft-tissue]
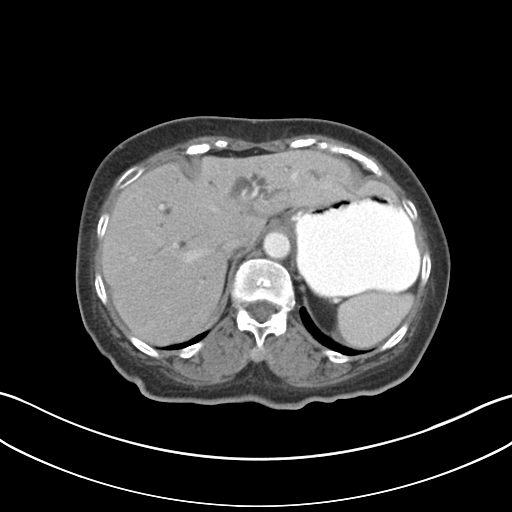
[im 86/91  soft-tissue]
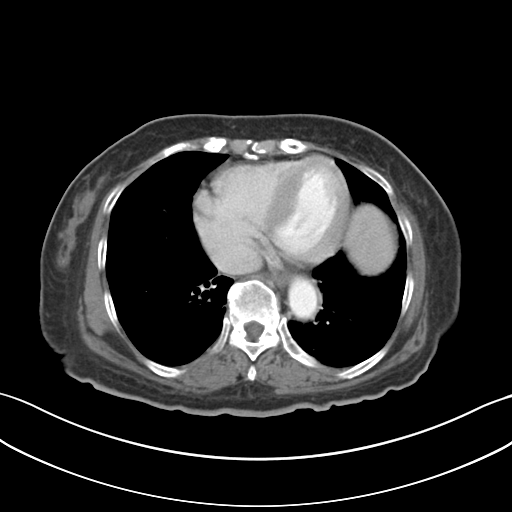

[Series 5: coronal a/|p · coronal · 0.74mm/px · 3 of 73 slices shown]
[im 25/73  soft-tissue]
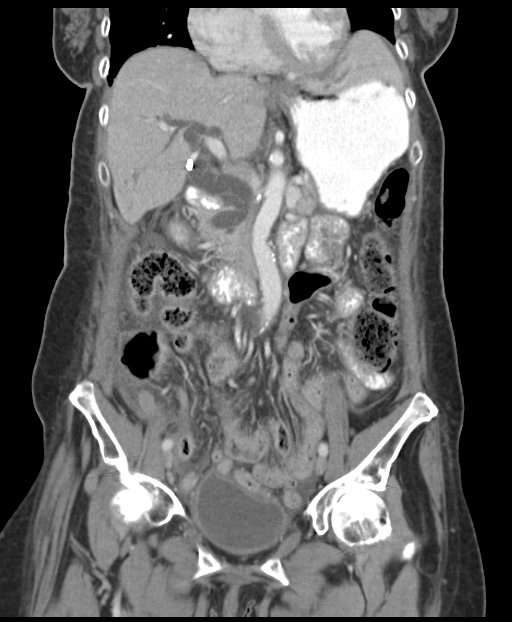
[im 33/73  soft-tissue]
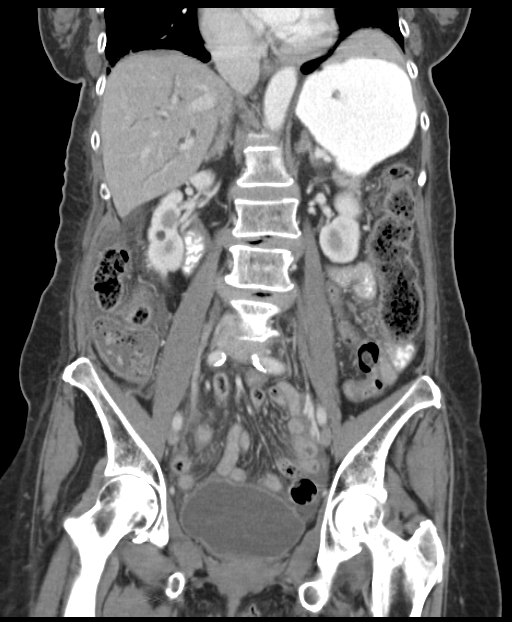
[im 41/73  soft-tissue]
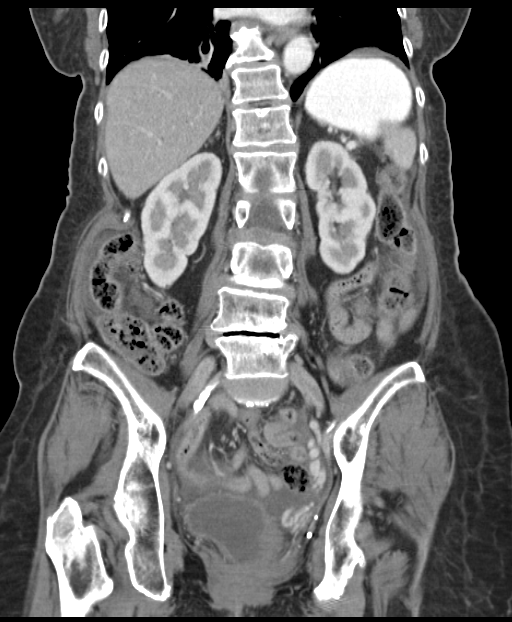

[15 of 46 positions shown; findings below may reference images not displayed]

FINDINGS: Lower chest: Again noted is bronchiectasis and scarring in the right
lower lobe where there appears to be chronic volume loss. Mild
scarring in the left lower lobe is also similar to the prior study.

Hepatobiliary: Status post cholecystectomy. Moderate intrahepatic
biliary ductal dilatation, similar to the prior examination. Common
bile duct remains dilated, measuring up to 13 mm in the porta
hepatis, similar to the prior examination. No suspicious cystic or
solid hepatic lesions.

Pancreas: No pancreatic mass. No pancreatic ductal dilatation. No
pancreatic or peripancreatic fluid or inflammatory changes.

Spleen: Unremarkable.

Adrenals/Urinary Tract: Bilateral adrenal glands are normal in
appearance. 1 cm simple cyst in the anterior aspect of the
interpolar region of the right kidney. Several tiny sub cm
low-attenuation lesions are noted in the kidneys bilaterally, too
small to definitively characterize, but similar to prior
examinations, favored to represent small cysts. No
hydroureteronephrosis. Urinary bladder is normal in appearance.

Stomach/Bowel: The appearance of the stomach is normal. No
pathologic dilatation of small bowel or colon. The appendix appears
enlarged, measuring in diameter, and there appears to be extensive
enhancement of the appendiceal mucosa. Although there is a small
amount of periappendiceal fluid, this is indistinguishable from the
greater volume of ascites. No discrete periappendiceal abscess is
identified at this time.

Vascular/Lymphatic: Extensive atherosclerosis throughout the
abdominal and pelvic vasculature, without evidence of aneurysm or
dissection. No lymphadenopathy noted in the abdomen or pelvis.

Reproductive: Uterus and ovaries are atrophic.

Other: Small volume of ascites.  No pneumoperitoneum.

Musculoskeletal: There are no aggressive appearing lytic or blastic
lesions noted in the visualized portions of the skeleton.
IMPRESSION: 1. The appendix appears enlarged, and there is avid enhancement of
the appendiceal mucosa, which could be indicative of recurrent acute
appendicitis. There is a small volume of ascites. However, there is
no definite periappendiceal abscess noted at this time.
2. No other definite acute findings identified in the abdomen or
pelvis.
3. Status post cholecystectomy. Chronic intra and extrahepatic
biliary ductal dilatation, similar to the prior examination,
presumably related to post cholecystectomy physiology.
4. Extensive atherosclerosis.
5. Additional incidental findings, as above, similar to prior
examinations.
These results were called by telephone at the time of interpretation
on 11/15/2014 at [DATE] to Dr. SENTILIAN DEVITA, who verbally acknowledged
these results.

## 2016-09-28 ENCOUNTER — Other Ambulatory Visit: Payer: Self-pay | Admitting: Internal Medicine

## 2016-09-28 DIAGNOSIS — M79661 Pain in right lower leg: Secondary | ICD-10-CM

## 2016-09-28 DIAGNOSIS — M79669 Pain in unspecified lower leg: Secondary | ICD-10-CM

## 2016-09-28 DIAGNOSIS — M79662 Pain in left lower leg: Principal | ICD-10-CM

## 2016-09-30 ENCOUNTER — Other Ambulatory Visit: Payer: Self-pay | Admitting: Internal Medicine

## 2016-09-30 ENCOUNTER — Ambulatory Visit
Admission: RE | Admit: 2016-09-30 | Discharge: 2016-09-30 | Disposition: A | Discharge status: Home / Self Care | Payer: Medicare HMO | Source: Ambulatory Visit | Attending: Internal Medicine | Admitting: Internal Medicine

## 2016-09-30 DIAGNOSIS — M79669 Pain in unspecified lower leg: Secondary | ICD-10-CM

## 2016-11-04 ENCOUNTER — Other Ambulatory Visit: Payer: Self-pay | Admitting: Internal Medicine

## 2016-11-04 ENCOUNTER — Ambulatory Visit
Admission: RE | Admit: 2016-11-04 | Discharge: 2016-11-04 | Disposition: A | Discharge status: Home / Self Care | Payer: Medicare HMO | Source: Ambulatory Visit | Attending: Internal Medicine | Admitting: Internal Medicine

## 2016-11-04 DIAGNOSIS — Z1231 Encounter for screening mammogram for malignant neoplasm of breast: Secondary | ICD-10-CM

## 2017-08-25 ENCOUNTER — Other Ambulatory Visit: Payer: Self-pay | Admitting: Internal Medicine

## 2017-08-29 ENCOUNTER — Other Ambulatory Visit: Payer: Self-pay | Admitting: Internal Medicine

## 2017-08-29 DIAGNOSIS — R202 Paresthesia of skin: Secondary | ICD-10-CM

## 2017-11-06 ENCOUNTER — Other Ambulatory Visit: Payer: Self-pay | Admitting: Internal Medicine

## 2017-11-06 DIAGNOSIS — Z1231 Encounter for screening mammogram for malignant neoplasm of breast: Secondary | ICD-10-CM

## 2017-11-08 ENCOUNTER — Other Ambulatory Visit: Payer: Self-pay | Admitting: Internal Medicine

## 2017-11-08 DIAGNOSIS — E2839 Other primary ovarian failure: Secondary | ICD-10-CM

## 2017-12-31 ENCOUNTER — Other Ambulatory Visit: Payer: Self-pay

## 2017-12-31 ENCOUNTER — Emergency Department (HOSPITAL_COMMUNITY): Payer: Medicare HMO

## 2017-12-31 ENCOUNTER — Encounter (HOSPITAL_COMMUNITY): Payer: Self-pay | Admitting: Emergency Medicine

## 2017-12-31 ENCOUNTER — Observation Stay (HOSPITAL_COMMUNITY)
Admission: EM | Admit: 2017-12-31 | Discharge: 2018-01-01 | Disposition: A | Discharge status: Home / Self Care | Payer: Medicare HMO | Attending: Internal Medicine | Admitting: Internal Medicine

## 2017-12-31 ENCOUNTER — Observation Stay (HOSPITAL_COMMUNITY): Payer: Medicare HMO

## 2017-12-31 DIAGNOSIS — E1122 Type 2 diabetes mellitus with diabetic chronic kidney disease: Secondary | ICD-10-CM | POA: Insufficient documentation

## 2017-12-31 DIAGNOSIS — E785 Hyperlipidemia, unspecified: Secondary | ICD-10-CM | POA: Diagnosis not present

## 2017-12-31 DIAGNOSIS — N3281 Overactive bladder: Secondary | ICD-10-CM | POA: Diagnosis not present

## 2017-12-31 DIAGNOSIS — R109 Unspecified abdominal pain: Secondary | ICD-10-CM | POA: Diagnosis present

## 2017-12-31 DIAGNOSIS — N182 Chronic kidney disease, stage 2 (mild): Secondary | ICD-10-CM | POA: Diagnosis not present

## 2017-12-31 DIAGNOSIS — K831 Obstruction of bile duct: Secondary | ICD-10-CM | POA: Diagnosis present

## 2017-12-31 DIAGNOSIS — E559 Vitamin D deficiency, unspecified: Secondary | ICD-10-CM | POA: Diagnosis not present

## 2017-12-31 DIAGNOSIS — I129 Hypertensive chronic kidney disease with stage 1 through stage 4 chronic kidney disease, or unspecified chronic kidney disease: Secondary | ICD-10-CM | POA: Insufficient documentation

## 2017-12-31 DIAGNOSIS — E1151 Type 2 diabetes mellitus with diabetic peripheral angiopathy without gangrene: Secondary | ICD-10-CM | POA: Diagnosis not present

## 2017-12-31 DIAGNOSIS — R1011 Right upper quadrant pain: Principal | ICD-10-CM | POA: Insufficient documentation

## 2017-12-31 DIAGNOSIS — Z79899 Other long term (current) drug therapy: Secondary | ICD-10-CM | POA: Insufficient documentation

## 2017-12-31 DIAGNOSIS — Z7982 Long term (current) use of aspirin: Secondary | ICD-10-CM | POA: Diagnosis not present

## 2017-12-31 DIAGNOSIS — D649 Anemia, unspecified: Secondary | ICD-10-CM | POA: Diagnosis not present

## 2017-12-31 DIAGNOSIS — K759 Inflammatory liver disease, unspecified: Secondary | ICD-10-CM | POA: Diagnosis not present

## 2017-12-31 DIAGNOSIS — R112 Nausea with vomiting, unspecified: Secondary | ICD-10-CM

## 2017-12-31 DIAGNOSIS — R748 Abnormal levels of other serum enzymes: Secondary | ICD-10-CM | POA: Diagnosis present

## 2017-12-31 DIAGNOSIS — R52 Pain, unspecified: Secondary | ICD-10-CM

## 2017-12-31 DIAGNOSIS — E876 Hypokalemia: Secondary | ICD-10-CM | POA: Diagnosis not present

## 2017-12-31 LAB — URINALYSIS, ROUTINE W REFLEX MICROSCOPIC
BILIRUBIN URINE: NEGATIVE
Glucose, UA: 150 mg/dL — AB
HGB URINE DIPSTICK: NEGATIVE
Ketones, ur: NEGATIVE mg/dL
Leukocytes, UA: NEGATIVE
NITRITE: NEGATIVE
PROTEIN: 30 mg/dL — AB
Specific Gravity, Urine: 1.012 (ref 1.005–1.030)
pH: 8 (ref 5.0–8.0)

## 2017-12-31 LAB — CBC
HEMATOCRIT: 35.2 % — AB (ref 36.0–46.0)
Hemoglobin: 11.4 g/dL — ABNORMAL LOW (ref 12.0–15.0)
MCH: 30.4 pg (ref 26.0–34.0)
MCHC: 32.4 g/dL (ref 30.0–36.0)
MCV: 93.9 fL (ref 80.0–100.0)
Platelets: 233 10*3/uL (ref 150–400)
RBC: 3.75 MIL/uL — ABNORMAL LOW (ref 3.87–5.11)
RDW: 14.6 % (ref 11.5–15.5)
WBC: 6.4 10*3/uL (ref 4.0–10.5)
nRBC: 0 % (ref 0.0–0.2)

## 2017-12-31 LAB — GLUCOSE, CAPILLARY
Glucose-Capillary: 143 mg/dL — ABNORMAL HIGH (ref 70–99)
Glucose-Capillary: 129 mg/dL — ABNORMAL HIGH (ref 70–99)
Glucose-Capillary: 116 mg/dL — ABNORMAL HIGH (ref 70–99)

## 2017-12-31 LAB — COMPREHENSIVE METABOLIC PANEL
ALK PHOS: 67 U/L (ref 38–126)
ALT: 200 U/L — AB (ref 0–44)
AST: 565 U/L — ABNORMAL HIGH (ref 15–41)
Albumin: 3.9 g/dL (ref 3.5–5.0)
Anion gap: 13 (ref 5–15)
BUN: 35 mg/dL — ABNORMAL HIGH (ref 8–23)
CALCIUM: 9.9 mg/dL (ref 8.9–10.3)
CO2: 27 mmol/L (ref 22–32)
CREATININE: 1 mg/dL (ref 0.44–1.00)
Chloride: 99 mmol/L (ref 98–111)
GFR calc Af Amer: 57 mL/min — ABNORMAL LOW (ref >60)
GFR, EST NON AFRICAN AMERICAN: 50 mL/min — AB (ref >60)
Glucose, Bld: 180 mg/dL — ABNORMAL HIGH (ref 70–99)
Potassium: 3 mmol/L — ABNORMAL LOW (ref 3.5–5.1)
Sodium: 139 mmol/L (ref 135–145)
Total Bilirubin: 1.3 mg/dL — ABNORMAL HIGH (ref 0.3–1.2)
Total Protein: 7.9 g/dL (ref 6.5–8.1)

## 2017-12-31 LAB — I-STAT CHEM 8, ED
BUN: 30 mg/dL — ABNORMAL HIGH (ref 8–23)
CALCIUM ION: 1.16 mmol/L (ref 1.15–1.40)
Chloride: 99 mmol/L (ref 98–111)
Creatinine, Ser: 1 mg/dL (ref 0.44–1.00)
Glucose, Bld: 190 mg/dL — ABNORMAL HIGH (ref 70–99)
HEMATOCRIT: 37 % (ref 36.0–46.0)
HEMOGLOBIN: 12.6 g/dL (ref 12.0–15.0)
Potassium: 3 mmol/L — ABNORMAL LOW (ref 3.5–5.1)
SODIUM: 139 mmol/L (ref 135–145)
TCO2: 27 mmol/L (ref 22–32)

## 2017-12-31 LAB — HEMOGLOBIN A1C
Hgb A1c MFr Bld: 6.4 % — ABNORMAL HIGH (ref 4.8–5.6)
Mean Plasma Glucose: 136.98 mg/dL

## 2017-12-31 LAB — SALICYLATE LEVEL: SALICYLATE LVL: 8.2 mg/dL (ref 2.8–30.0)

## 2017-12-31 LAB — LIPASE, BLOOD: LIPASE: 46 U/L (ref 11–51)

## 2017-12-31 LAB — ACETAMINOPHEN LEVEL

## 2017-12-31 LAB — I-STAT TROPONIN, ED: TROPONIN I, POC: 0.02 ng/mL (ref 0.00–0.08)

## 2017-12-31 LAB — MAGNESIUM: Magnesium: 1.4 mg/dL — ABNORMAL LOW (ref 1.7–2.4)

## 2017-12-31 MED ORDER — FENTANYL CITRATE (PF) 100 MCG/2ML IJ SOLN
50.0000 ug | Freq: Once | INTRAMUSCULAR | Status: AC
  Administered 2017-12-31: 50 ug via INTRAVENOUS
  Filled 2017-12-31: qty 2

## 2017-12-31 MED ORDER — ROSUVASTATIN CALCIUM 20 MG PO TABS
20.0000 mg | ORAL_TABLET | Freq: Every day | ORAL | Status: DC
  Administered 2017-12-31 – 2018-01-01 (×2): 20 mg via ORAL
  Filled 2017-12-31 (×2): qty 1

## 2017-12-31 MED ORDER — DICYCLOMINE HCL 10 MG/ML IM SOLN
20.0000 mg | Freq: Once | INTRAMUSCULAR | Status: AC
  Administered 2017-12-31: 20 mg via INTRAMUSCULAR
  Filled 2017-12-31: qty 2

## 2017-12-31 MED ORDER — INSULIN ASPART 100 UNIT/ML ~~LOC~~ SOLN
0.0000 [IU] | Freq: Three times a day (TID) | SUBCUTANEOUS | Status: DC
  Administered 2017-12-31 (×2): 1 [IU] via SUBCUTANEOUS

## 2017-12-31 MED ORDER — MAGNESIUM SULFATE 2 GM/50ML IV SOLN
2.0000 g | Freq: Once | INTRAVENOUS | Status: AC
  Administered 2017-12-31: 2 g via INTRAVENOUS
  Filled 2017-12-31: qty 50

## 2017-12-31 MED ORDER — IRBESARTAN 150 MG PO TABS
150.0000 mg | ORAL_TABLET | Freq: Every day | ORAL | Status: DC
  Administered 2017-12-31 – 2018-01-01 (×2): 150 mg via ORAL
  Filled 2017-12-31 (×2): qty 1

## 2017-12-31 MED ORDER — AMLODIPINE BESYLATE 10 MG PO TABS
10.0000 mg | ORAL_TABLET | Freq: Every day | ORAL | Status: DC
  Administered 2017-12-31 – 2018-01-01 (×2): 10 mg via ORAL
  Filled 2017-12-31 (×2): qty 1

## 2017-12-31 MED ORDER — POTASSIUM CHLORIDE 10 MEQ/100ML IV SOLN
INTRAVENOUS | Status: AC
  Filled 2017-12-31: qty 100

## 2017-12-31 MED ORDER — HYDROCHLOROTHIAZIDE 25 MG PO TABS
25.0000 mg | ORAL_TABLET | Freq: Every day | ORAL | Status: DC
  Administered 2017-12-31 – 2018-01-01 (×2): 25 mg via ORAL
  Filled 2017-12-31 (×2): qty 1

## 2017-12-31 MED ORDER — FENTANYL CITRATE (PF) 100 MCG/2ML IJ SOLN
100.0000 ug | Freq: Once | INTRAMUSCULAR | Status: DC
Start: 2017-12-31 — End: 2018-01-01
  Filled 2017-12-31: qty 2

## 2017-12-31 MED ORDER — CILOSTAZOL 100 MG PO TABS
50.0000 mg | ORAL_TABLET | Freq: Every day | ORAL | Status: DC
  Administered 2018-01-01: 50 mg via ORAL
  Filled 2017-12-31: qty 1

## 2017-12-31 MED ORDER — KETOROLAC TROMETHAMINE 30 MG/ML IJ SOLN
15.0000 mg | Freq: Once | INTRAMUSCULAR | Status: AC
  Administered 2017-12-31: 15 mg via INTRAVENOUS
  Filled 2017-12-31: qty 1

## 2017-12-31 MED ORDER — FENOFIBRATE 160 MG PO TABS
160.0000 mg | ORAL_TABLET | Freq: Every day | ORAL | Status: DC
  Administered 2017-12-31: 160 mg via ORAL
  Filled 2017-12-31: qty 1

## 2017-12-31 MED ORDER — GADOBUTROL 1 MMOL/ML IV SOLN
10.0000 mL | Freq: Once | INTRAVENOUS | Status: AC | PRN
  Administered 2017-12-31: 8 mL via INTRAVENOUS

## 2017-12-31 MED ORDER — ATENOLOL 50 MG PO TABS
100.0000 mg | ORAL_TABLET | Freq: Every day | ORAL | Status: DC
  Administered 2017-12-31: 100 mg via ORAL
  Filled 2017-12-31: qty 2

## 2017-12-31 MED ORDER — CILOSTAZOL 100 MG PO TABS
50.0000 mg | ORAL_TABLET | Freq: Every day | ORAL | Status: DC
  Filled 2017-12-31: qty 1

## 2017-12-31 MED ORDER — OXYBUTYNIN CHLORIDE 5 MG PO TABS
5.0000 mg | ORAL_TABLET | Freq: Two times a day (BID) | ORAL | Status: DC
  Administered 2017-12-31 – 2018-01-01 (×3): 5 mg via ORAL
  Filled 2017-12-31 (×3): qty 1

## 2017-12-31 MED ORDER — DILTIAZEM HCL 60 MG PO TABS
120.0000 mg | ORAL_TABLET | Freq: Every day | ORAL | Status: DC
  Administered 2017-12-31 – 2018-01-01 (×2): 120 mg via ORAL
  Filled 2017-12-31 (×2): qty 2

## 2017-12-31 MED ORDER — VALSARTAN-HYDROCHLOROTHIAZIDE 160-25 MG PO TABS: 1.0000 | ORAL_TABLET | Freq: Every day | ORAL | Status: DC

## 2017-12-31 MED ORDER — COENZYME Q10 200 MG PO CAPS: 200.0000 mg | ORAL_CAPSULE | Freq: Every day | ORAL | Status: DC

## 2017-12-31 MED ORDER — INSULIN ASPART 100 UNIT/ML ~~LOC~~ SOLN: 0.0000 [IU] | Freq: Every day | SUBCUTANEOUS | Status: DC

## 2017-12-31 MED ORDER — GI COCKTAIL ~~LOC~~
30.0000 mL | Freq: Once | ORAL | Status: AC
  Administered 2017-12-31: 30 mL via ORAL
  Filled 2017-12-31: qty 30

## 2017-12-31 MED ORDER — FENOFIBRATE 160 MG PO TABS
160.0000 mg | ORAL_TABLET | Freq: Every day | ORAL | Status: DC
  Filled 2017-12-31: qty 1

## 2017-12-31 MED ORDER — INFLUENZA VAC SPLIT HIGH-DOSE 0.5 ML IM SUSY
0.5000 mL | PREFILLED_SYRINGE | INTRAMUSCULAR | Status: DC
  Filled 2017-12-31: qty 0.5

## 2017-12-31 MED ORDER — POTASSIUM CHLORIDE 10 MEQ/100ML IV SOLN
10.0000 meq | INTRAVENOUS | Status: AC
  Administered 2017-12-31 (×5): 10 meq via INTRAVENOUS
  Filled 2017-12-31 (×4): qty 100

## 2017-12-31 MED ORDER — ENOXAPARIN SODIUM 40 MG/0.4ML ~~LOC~~ SOLN
40.0000 mg | SUBCUTANEOUS | Status: DC
  Administered 2017-12-31 – 2018-01-01 (×2): 40 mg via SUBCUTANEOUS
  Filled 2017-12-31 (×2): qty 0.4

## 2017-12-31 MED ORDER — ASPIRIN EC 81 MG PO TBEC
81.0000 mg | DELAYED_RELEASE_TABLET | Freq: Every day | ORAL | Status: DC
  Administered 2017-12-31 – 2018-01-01 (×2): 81 mg via ORAL
  Filled 2017-12-31 (×2): qty 1

## 2017-12-31 NOTE — Progress Notes (Signed)
PHARMACIST - PHYSICIAN ORDER COMMUNICATION ° °CONCERNING: [employer] Medication Policy on Herbal Medications ° °DESCRIPTION:  This patient’s order for:  CoEnzyme Q10  has been noted. ° °This product(s) is classified as an “herbal” or natural product. °Due to a lack of definitive safety studies or FDA approval, nonstandard manufacturing practices, plus the potential risk of unknown drug-drug interactions while on inpatient medications, the Pharmacy and Therapeutics Committee does not permit the use of “herbal” or natural products of this type within [place]. °  °ACTION TAKEN: °The pharmacy department is unable to verify this order at this time and your patient has been informed of this safety policy. °Please reevaluate patient’s clinical condition at discharge and address if the herbal or natural product(s) should be resumed at that time.  ° °Ryann Pauli, PharmD °

## 2017-12-31 NOTE — ED Notes (Signed)
Patient transported to CT 

## 2017-12-31 NOTE — H&P (Addendum)
History and Physical    Kristine Burnett [id number] DOB: 1929-09-14 DOA: 12/31/2017  PCP: Willey Blade, MD  Patient coming from: home  I have personally briefly reviewed patient's old medical records in [place]  Chief Complaint: abdominal pain   HPI: Kristine Burnett is Kristine Burnett 84 y.o. female with medical history significant of T2DM, HTN, HLD, SBO, s/p appendectomy, s/p cholecystectomy, and multiple other medical problems presenting with abdominal pain.  She notes her pain started last night after she ate Boniface Goffe hot dog.  She was unable to rest because of the pain.  She vomited undigested food.  She describes the pain is being across the top of her abdomen most in the epigastric region but also radiating to her back.  She could not get comfortable because the pain.  She describes the pain as achy, coming and going.  She notes that the pain has only recently subsides up subsided after she got to the emergency department after she urinated.  She denies any fevers, chills, cough, cold, chest pain, shortness of breath, pain with urination, recent travel, sick contacts.  She takes 650 mg APAP twice daily.  Denies any smoking, drinking,  or other drugs.   ED Course: Labs, imaging.  Admit for elevated liver enzymes.  Review of Systems: As per HPI otherwise 10 point review of systems negative.   Past Medical History:  Diagnosis Date  . Asthma   . DDD (degenerative disc disease)   . Diabetes mellitus without complication ([place])   . Hyperlipidemia   . Hypertension   . Right lower quadrant abdominal abscess ([place])   . SBO (small bowel obstruction) ([place]) 03/15/2014  . Spinal stenosis   . Type II or unspecified type diabetes mellitus without mention of complication, not stated as uncontrolled 03/22/2013  . Vitamin D deficiency     Past Surgical History:  Procedure Laterality Date  . CHOLECYSTECTOMY    . CYST EXCISION  back  . HERNIA REPAIR    . LAPAROSCOPIC APPENDECTOMY N/Lauramae Kneisley 11/15/2014   Procedure: APPENDECTOMY LAPAROSCOPIC;  Surgeon: Michael Boston, MD;  Location: [facility];  Service: General;  Laterality: N/Shawnice Tilmon;  . MINI-LAPAROTOMY W/ TUBAL LIGATION  1964     reports that she has never smoked. She has never used smokeless tobacco. She reports that she does not drink alcohol or use drugs.  Allergies  Allergen Reactions  . Ace Inhibitors Cough    Enalapril OK  . Crestor [Rosuvastatin] Other (See Comments)    Legs cramp   . Metformin And Related Diarrhea  . Penicillins Other (See Comments)    unknown Has patient had Cashlyn Huguley PCN reaction causing immediate rash, facial/tongue/throat swelling, SOB or lightheadedness with hypotension: No Has patient had Dallin Mccorkel PCN reaction causing severe rash involving mucus membranes or skin necrosis: No Has patient had Amaru Burroughs PCN reaction that required hospitalization: No Has patient had Darel Ricketts PCN reaction occurring within the last 10 years: No If all of the above answers are "NO", then may proceed with Cephalosporin use.   . Grapefruit Extract Rash  . Minoxidil Other (See Comments)    Grow hair on face    Family History  Problem Relation Age of Onset  . Heart attack Mother   . Hypertension Mother   . Heart attack Father   . Stroke Sister   . Breast cancer Sister   . Heart attack Brother        x 2  . Colon cancer Sister    Prior to Admission medications  Medication Sig Start Date End Date Taking? Authorizing Provider  amLODipine (NORVASC) 10 MG tablet Take 10 mg by mouth daily. 11/08/17  Yes [provider]  aspirin EC 81 MG tablet Take 81 mg by mouth daily.   Yes [provider]  atenolol (TENORMIN) 100 MG tablet TAKE 1 TABLET BY MOUTH DAILY FOR BLOOD PRESSURE 12/02/13  Yes Unk Pinto, MD  Cholecalciferol (VITAMIN D-3) 1000 UNITS CAPS Take 2,000 Units by mouth daily.    Yes [provider]  cilostazol (PLETAL) 50 MG tablet Take 50 mg by mouth daily. 12/29/17  Yes [provider]  Coenzyme Q10 200 MG capsule  Take 200 mg by mouth daily.   Yes [provider]  diltiazem (CARDIZEM) 120 MG tablet TAKE 1 TABLET BY MOUTH TWICE DAILY FOR HIGH BLOOD PRESSURE Patient taking differently: Take 120 mg by mouth daily.  08/03/13  Yes Vicie Mutters, PA-C  fenofibrate 160 MG tablet Take 160 mg by mouth daily.  11/03/14  Yes [provider]  Flaxseed, Linseed, (FLAXSEED OIL) 1000 MG CAPS Take 1,000 mg by mouth 4 (four) times daily.   Yes [provider]  Lancets (FREESTYLE) lancets Test glucose 1 time daily 08/16/13  Yes Unk Pinto, MD  Magnesium Oxide (MAG-OX PO) Take 1 tablet by mouth daily.    Yes [provider]  oxybutynin (DITROPAN) 5 MG tablet Take 5 mg by mouth 2 (two) times daily. 12/22/17  Yes [provider]  rosuvastatin (CRESTOR) 20 MG tablet Take 20 mg by mouth daily. 11/03/17  Yes [provider]  valsartan-hydrochlorothiazide (DIOVAN-HCT) 160-25 MG tablet Take 1 tablet by mouth daily. 11/18/17  Yes [provider]    Physical Exam: Vitals:   12/31/17 0630 12/31/17 0700 12/31/17 0730 12/31/17 0845  BP: (!) 163/78 (!) 157/76 134/70 132/66  Pulse: 98 (!) 101 95 (!) 102  Resp: 17 (!) '25 20 20  ' Temp:    100.1 F (37.8 C)  TempSrc:    Oral  SpO2: 99% 99% 99% 98%  Weight:      Height:        Constitutional: NAD, calm, comfortable Vitals:   12/31/17 0630 12/31/17 0700 12/31/17 0730 12/31/17 0845  BP: (!) 163/78 (!) 157/76 134/70 132/66  Pulse: 98 (!) 101 95 (!) 102  Resp: 17 (!) '25 20 20  ' Temp:    100.1 F (37.8 C)  TempSrc:    Oral  SpO2: 99% 99% 99% 98%  Weight:      Height:       Eyes: PERRL, lids and conjunctivae normal ENMT: Mucous membranes are moist. Posterior pharynx clear of any exudate or lesions.Normal dentition.  Neck: normal, supple, no masses, no thyromegaly Respiratory: clear to auscultation bilaterally, no wheezing, no crackles Cardiovascular: Regular rate and rhythm, no murmurs / rubs / gallops. No  extremity edema. 2+ pedal pulses.  Abdomen: no tenderness, no masses palpated. No hepatosplenomegaly. Bowel sounds positive.  Musculoskeletal: no clubbing / cyanosis. No joint deformity upper and lower extremities. Good ROM, no contractures. Normal muscle tone.  Skin: no rashes, lesions, ulcers. No induration Neurologic: CN 2-12 grossly intact. Sensation intact. Strength 5/5 in all 4.  Psychiatric: Normal judgment and insight. Alert and oriented x 3. Normal mood.   Labs on Admission: I have personally reviewed following labs and imaging studies  CBC: Recent Labs  Lab 12/31/17 0416 12/31/17 0426  WBC 6.4  --   HGB 11.4* 12.6  HCT 35.2* 37.0  MCV 93.9  --  PLT 233  --    Basic Metabolic Panel: Recent Labs  Lab 12/31/17 0416 12/31/17 0426  NA 139 139  K 3.0* 3.0*  CL 99 99  CO2 27  --   GLUCOSE 180* 190*  BUN 35* 30*  CREATININE 1.00 1.00  CALCIUM 9.9  --    GFR: Estimated Creatinine Clearance: 40.9 mL/min (by C-G formula based on SCr of 1 mg/dL). Liver Function Tests: Recent Labs  Lab 12/31/17 0416  AST 565*  ALT 200*  ALKPHOS 67  BILITOT 1.3*  PROT 7.9  ALBUMIN 3.9   Recent Labs  Lab 12/31/17 0416  LIPASE 46   No results for input(s): AMMONIA in the last 168 hours. Coagulation Profile: No results for input(s): INR, PROTIME in the last 168 hours. Cardiac Enzymes: No results for input(s): CKTOTAL, CKMB, CKMBINDEX, TROPONINI in the last 168 hours. BNP (last 3 results) No results for input(s): PROBNP in the last 8760 hours. HbA1C: No results for input(s): HGBA1C in the last 72 hours. CBG: No results for input(s): GLUCAP in the last 168 hours. Lipid Profile: No results for input(s): CHOL, HDL, LDLCALC, TRIG, CHOLHDL, LDLDIRECT in the last 72 hours. Thyroid Function Tests: No results for input(s): TSH, T4TOTAL, FREET4, T3FREE, THYROIDAB in the last 72 hours. Anemia Panel: No results for input(s): VITAMINB12, FOLATE, FERRITIN, TIBC, IRON, RETICCTPCT in the  last 72 hours. Urine analysis:    Component Value Date/Time   COLORURINE YELLOW 12/31/2017 0624   APPEARANCEUR HAZY (Sherine Cortese) 12/31/2017 0624   LABSPEC 1.012 12/31/2017 0624   PHURINE 8.0 12/31/2017 0624   GLUCOSEU 150 (Kayhan Boardley) 12/31/2017 0624   HGBUR NEGATIVE 12/31/2017 0624   BILIRUBINUR NEGATIVE 12/31/2017 0624   KETONESUR NEGATIVE 12/31/2017 0624   PROTEINUR 30 (Kashis Penley) 12/31/2017 0624   UROBILINOGEN 0.2 11/15/2014 0650   NITRITE NEGATIVE 12/31/2017 0624   LEUKOCYTESUR NEGATIVE 12/31/2017 0624    Radiological Exams on Admission: Ct Renal Stone Study  Result Date: 12/31/2017 CLINICAL DATA:  Right scapular pain radiating beneath the ribs. Umbilical pain. EXAM: CT ABDOMEN AND PELVIS WITHOUT CONTRAST TECHNIQUE: Multidetector CT imaging of the abdomen and pelvis was performed following the standard protocol without IV contrast. COMPARISON:  11/15/2014 FINDINGS: LOWER CHEST: There is no basilar pleural or apical pericardial effusion. HEPATOBILIARY: Intrahepatic and extrahepatic biliary dilatation minimally decreased compared to 11/15/2014. Common bile duct measures 14 mm. No focal abnormal density of the liver. Status post cholecystectomy. PANCREAS: The pancreatic parenchymal contours are normal and there is no ductal dilatation. There is no peripancreatic fluid collection. SPLEEN: Normal. ADRENALS/URINARY TRACT: --Adrenal glands: Normal. --Right kidney/ureter: No hydronephrosis, nephroureterolithiasis, perinephric stranding or solid renal mass. --Left kidney/ureter: No hydronephrosis, nephroureterolithiasis, perinephric stranding or solid renal mass. --Urinary bladder: Normal for degree of distention STOMACH/BOWEL: --Stomach/Duodenum: There is no hiatal hernia or other gastric abnormality. The duodenal course and caliber are normal. --Small bowel: No dilatation or inflammation. --Colon: No focal abnormality. --Appendix: Normal. VASCULAR/LYMPHATIC: Atherosclerotic calcification is present within the  non-aneurysmal abdominal aorta, without hemodynamically significant stenosis. No abdominal or pelvic lymphadenopathy. REPRODUCTIVE: Normal uterus and ovaries. MUSCULOSKELETAL. Grade 1 anterolisthesis at L3-4 and L4-5 secondary to facet hypertrophy. OTHER: None. IMPRESSION: 1. No obstructive uropathy or nephrolithiasis. 2. No acute abnormality of the abdomen or pelvis. 3. Persistent biliary dilatation, minimally increased since 11/15/2014 and likely due to post cholecystectomy status. 4.  Aortic Atherosclerosis (ICD10-I70.0). Electronically Signed   By: Ulyses Jarred M.D.   On: 12/31/2017 06:16    EKG: Independently reviewed. Normal Sinus Rhythm, appears c/w priors.  Assessment/Plan Active Problems:   Biliary obstruction   Elevated liver enzymes   Abdominal Pain  Elevated Liver Enzymes:  Abdominal pain in setting of elevated liver enzymes.  She's had hx of cholecystectomy.  Imaging with evidence of mildly increased biliary dilatation, question if she may have recently passed stone with elevated liver enzymes (despite only mildly elevated bili and normal alk phos).  Pain interestingly resolved after pt urinated.   CT without acute finding Follow acute hepatitis panel, ASA, APAP levels Pain is currently resolved, continue to monitor  Hypertension: continue amlodipine, atenolol, diltiazem. Will hold valsartan-HCTZ for now.  HLD: crestor  Overactive bladder: continue oxybutynin  Hypokalemia: follow mag, replete, follow  Anemia: follow  T2DM: follow A1c, SSI   Peripheral Artery Disease: continue pletal  Continue ASA for what appears to be for primary prevention vs PAD  DVT prophylaxis: lovenox Code Status: full, confirmed with pt and daughter  Family Communication: daughter at bedsdie  Disposition Plan: pending improvement, possibly tomorrow  Consults called: none  Admission status: observation    Fayrene Helper MD [facility] Pager [phone]  If 7PM-7AM, please  contact night-coverage [email or web address] Password [email or web address]  12/31/2017, 10:57 AM

## 2017-12-31 NOTE — ED Notes (Signed)
ED TO INPATIENT HANDOFF REPORT  Name/Age/Gender Kristine Burnett 84 y.o. female  Code Status Code Status History    Date Active Date Inactive Code Status Order ID Comments User Context   11/15/2014 0818 11/18/2014 1632 Full Code [id number]  Michael Boston, MD ED   03/22/2014 1633 03/24/2014 2317 Full Code [id number]  Sandi Mariscal, MD Inpatient   03/15/2014 1613 03/22/2014 1633 Full Code [id number]  Robbie Lis, MD Inpatient   03/06/2014 0909 03/09/2014 1903 Full Code [id number]  Corrie Mckusick, DO Inpatient   02/27/2014 0341 03/06/2014 0909 Full Code [id number]  Rise Patience, MD Inpatient      Home/SNF/Other Uknown  Chief Complaint abdominal pain  Level of Care/Admitting Diagnosis ED Disposition    ED Disposition Condition [facility]: [facility] [[id number]]  Level of Care: Telemetry [5]  Admit to tele based on following criteria: Monitor QTC interval  Diagnosis: Biliary obstruction [272536]  Admitting Physician: Vianne Bulls [[id number]]  Attending Physician: Vianne Bulls [[id number]]  PT Class (Do Not Modify): Observation [104]  PT Acc Code (Do Not Modify): Observation [10022]       Medical History Past Medical History:  Diagnosis Date  . Asthma   . DDD (degenerative disc disease)   . Diabetes mellitus without complication ([place])   . Hyperlipidemia   . Hypertension   . Right lower quadrant abdominal abscess ([place])   . SBO (small bowel obstruction) ([place]) 03/15/2014  . Spinal stenosis   . Type II or unspecified type diabetes mellitus without mention of complication, not stated as uncontrolled 03/22/2013  . Vitamin D deficiency     Allergies Allergies  Allergen Reactions  . Ace Inhibitors Cough    Enalapril OK  . Crestor [Rosuvastatin] Other (See Comments)    Legs cramp   . Metformin And Related Diarrhea  . Other   . Penicillins Other (See Comments)    unknown  . Grapefruit Extract Rash  . Minoxidil Other (See Comments)     Grow hair on face    IV Location/Drains/Wounds Patient Lines/Drains/Airways Status   Active Line/Drains/Airways    Name:   Placement date:   Placement time:   Site:   Days:   Peripheral IV 12/31/17 Right Antecubital   12/31/17    0415    Antecubital   less than 1   Incision (Closed) 11/15/14 Abdomen Other (Comment)   11/15/14    1405     1142   Incision - 3 Ports Abdomen 1: Lower;Left 2: Umbilicus 3: Left;Upper   11/15/14    1359     1142          Labs/Imaging Results for orders placed or performed during the hospital encounter of 12/31/17 (from the past 48 hour(s))  Lipase, blood     Status: None   Collection Time: 12/31/17  4:16 AM  Result Value Ref Range   Lipase 46 11 - 51 U/L    Comment: Performed at [facility], [place] [address], [place], [place] [address]  Comprehensive metabolic panel     Status: Abnormal   Collection Time: 12/31/17  4:16 AM  Result Value Ref Range   Sodium 139 135 - 145 mmol/L   Potassium 3.0 (L) 3.5 - 5.1 mmol/L   Chloride 99 98 - 111 mmol/L   CO2 27 22 - 32 mmol/L   Glucose, Bld 180 (H) 70 - 99 mg/dL   BUN 35 (H) 8 - 23 mg/dL  Creatinine, Ser 1.00 0.44 - 1.00 mg/dL   Calcium 9.9 8.9 - 10.3 mg/dL   Total Protein 7.9 6.5 - 8.1 g/dL   Albumin 3.9 3.5 - 5.0 g/dL   AST 565 (H) 15 - 41 U/L   ALT 200 (H) 0 - 44 U/L   Alkaline Phosphatase 67 38 - 126 U/L   Total Bilirubin 1.3 (H) 0.3 - 1.2 mg/dL   GFR calc non Af Amer 50 (L) >60 mL/min   GFR calc Af Amer 57 (L) >60 mL/min    Comment: (NOTE) The eGFR has been calculated using the CKD EPI equation. This calculation has not been validated in all clinical situations. eGFR's persistently <60 mL/min signify possible Chronic Kidney Disease.    Anion gap 13 5 - 15    Comment: Performed at [facility], [place] [address], [place], [place] [address]  CBC     Status: Abnormal   Collection Time: 12/31/17  4:16 AM  Result Value Ref Range   WBC 6.4 4.0 - 10.5 K/uL   RBC  3.75 (L) 3.87 - 5.11 MIL/uL   Hemoglobin 11.4 (L) 12.0 - 15.0 g/dL   HCT 35.2 (L) 36.0 - 46.0 %   MCV 93.9 80.0 - 100.0 fL   MCH 30.4 26.0 - 34.0 pg   MCHC 32.4 30.0 - 36.0 g/dL   RDW 14.6 11.5 - 15.5 %   Platelets 233 150 - 400 K/uL   nRBC 0.0 0.0 - 0.2 %    Comment: Performed at [facility], [place] [address] [place], [place] [address]  I-stat troponin, ED     Status: None   Collection Time: 12/31/17  4:25 AM  Result Value Ref Range   Troponin i, poc 0.02 0.00 - 0.08 ng/mL   Comment 3            Comment: Due to the release kinetics of cTnI, a negative result within the first hours of the onset of symptoms does not rule out myocardial infarction with certainty. If myocardial infarction is still suspected, repeat the test at appropriate intervals.   I-Stat Chem 8, ED     Status: Abnormal   Collection Time: 12/31/17  4:26 AM  Result Value Ref Range   Sodium 139 135 - 145 mmol/L   Potassium 3.0 (L) 3.5 - 5.1 mmol/L   Chloride 99 98 - 111 mmol/L   BUN 30 (H) 8 - 23 mg/dL   Creatinine, Ser 1.00 0.44 - 1.00 mg/dL   Glucose, Bld 190 (H) 70 - 99 mg/dL   Calcium, Ion 1.16 1.15 - 1.40 mmol/L   TCO2 27 22 - 32 mmol/L   Hemoglobin 12.6 12.0 - 15.0 g/dL   HCT 37.0 36.0 - 46.0 %  Urinalysis, Routine w reflex microscopic     Status: Abnormal   Collection Time: 12/31/17  6:24 AM  Result Value Ref Range   Color, Urine YELLOW YELLOW   APPearance HAZY (A) CLEAR   Specific Gravity, Urine 1.012 1.005 - 1.030   pH 8.0 5.0 - 8.0   Glucose, UA 150 (A) NEGATIVE mg/dL   Hgb urine dipstick NEGATIVE NEGATIVE   Bilirubin Urine NEGATIVE NEGATIVE   Ketones, ur NEGATIVE NEGATIVE mg/dL   Protein, ur 30 (A) NEGATIVE mg/dL   Nitrite NEGATIVE NEGATIVE   Leukocytes, UA NEGATIVE NEGATIVE   RBC / HPF 0-5 0 - 5 RBC/hpf   WBC, UA 0-5 0 - 5 WBC/hpf   Bacteria, UA RARE (A) NONE SEEN  Mucus PRESENT     Comment: Performed at [facility], [place] [address]  [place], [place] [address]   Ct Renal Stone Study  Result Date: 12/31/2017 CLINICAL DATA:  Right scapular pain radiating beneath the ribs. Umbilical pain. EXAM: CT ABDOMEN AND PELVIS WITHOUT CONTRAST TECHNIQUE: Multidetector CT imaging of the abdomen and pelvis was performed following the standard protocol without IV contrast. COMPARISON:  11/15/2014 FINDINGS: LOWER CHEST: There is no basilar pleural or apical pericardial effusion. HEPATOBILIARY: Intrahepatic and extrahepatic biliary dilatation minimally decreased compared to 11/15/2014. Common bile duct measures 14 mm. No focal abnormal density of the liver. Status post cholecystectomy. PANCREAS: The pancreatic parenchymal contours are normal and there is no ductal dilatation. There is no peripancreatic fluid collection. SPLEEN: Normal. ADRENALS/URINARY TRACT: --Adrenal glands: Normal. --Right kidney/ureter: No hydronephrosis, nephroureterolithiasis, perinephric stranding or solid renal mass. --Left kidney/ureter: No hydronephrosis, nephroureterolithiasis, perinephric stranding or solid renal mass. --Urinary bladder: Normal for degree of distention STOMACH/BOWEL: --Stomach/Duodenum: There is no hiatal hernia or other gastric abnormality. The duodenal course and caliber are normal. --Small bowel: No dilatation or inflammation. --Colon: No focal abnormality. --Appendix: Normal. VASCULAR/LYMPHATIC: Atherosclerotic calcification is present within the non-aneurysmal abdominal aorta, without hemodynamically significant stenosis. No abdominal or pelvic lymphadenopathy. REPRODUCTIVE: Normal uterus and ovaries. MUSCULOSKELETAL. Grade 1 anterolisthesis at L3-4 and L4-5 secondary to facet hypertrophy. OTHER: None. IMPRESSION: 1. No obstructive uropathy or nephrolithiasis. 2. No acute abnormality of the abdomen or pelvis. 3. Persistent biliary dilatation, minimally increased since 11/15/2014 and likely due to post cholecystectomy status. 4.  Aortic Atherosclerosis (ICD10-I70.0).  Electronically Signed   By: Ulyses Jarred M.D.   On: 12/31/2017 06:16   None  Pending Labs Unresulted Labs (From admission, onward)    Start     Ordered   12/31/17 0716  Hepatitis panel, acute  Add-on,   R     12/31/17 0715   12/31/17 0716  Acetaminophen level  Add-on,   R     12/31/17 0715   76/16/07 3710  Salicylate level  Add-on,   R     12/31/17 0715          Vitals/Pain Today's Vitals   12/31/17 0514 12/31/17 0630 12/31/17 0700 12/31/17 0730  BP:  (!) 163/78 (!) 157/76 134/70  Pulse:  98 (!) 101 95  Resp:  17 (!) 25 20  Temp:      TempSrc:      SpO2:  99% 99% 99%  Weight:      Height:      PainSc: Asleep       Isolation Precautions No active isolations  Medications Medications  fentaNYL (SUBLIMAZE) injection 100 mcg (0 mcg Intravenous Hold 12/31/17 0623)  potassium chloride 10 mEq in 100 mL IVPB (10 mEq Intravenous New Bag/Given 12/31/17 0700)  fentaNYL (SUBLIMAZE) injection 50 mcg (50 mcg Intravenous Given 12/31/17 0442)  ketorolac (TORADOL) 30 MG/ML injection 15 mg (15 mg Intravenous Given 12/31/17 0658)  gi cocktail (Maalox,Lidocaine,Donnatal) (30 mLs Oral Given 12/31/17 0659)  dicyclomine (BENTYL) injection 20 mg (20 mg Intramuscular Given 12/31/17 0658)    Mobility Unknown

## 2017-12-31 NOTE — ED Triage Notes (Signed)
Patient is complaining of upper abdominal pain that started around 10 pm. Patient states that the pain radiates to her back. Patient made herself throw up because she thought it might been something she ate. Patient states after she threw up, she did not feel any better.

## 2017-12-31 NOTE — ED Notes (Signed)
EKG given to EDP,Palumbo,MD., for review. 

## 2017-12-31 NOTE — Plan of Care (Signed)
Patient without abdominal pain, vomiting or diarrhea since admission.  Has tolerated clear liquids well.  Up to bathroom to void multiple times.  Daughters at bedside.

## 2017-12-31 NOTE — ED Provider Notes (Signed)
[facility] DEPT Provider Note   CSN: [id number] Arrival date & time: 12/31/17  0316     History   Chief Complaint Chief Complaint  Patient presents with  . Abdominal Pain    HPI Kristine Burnett is a 84 y.o. female.  The history is provided by the patient.  Abdominal Pain   This is a new problem. The current episode started 6 to 12 hours ago. The problem occurs constantly. The problem has not changed since onset.The pain is associated with eating (ate hot dogs thought they were bad and induced vomiting but pain is not any better.  Vomiting in the ED. ). The pain is located in the RUQ. The quality of the pain is sharp. The pain is at a severity of 10/10. The pain is severe. Associated symptoms include nausea and vomiting. Pertinent negatives include fever and dysuria. Nothing aggravates the symptoms. Nothing relieves the symptoms. Past workup includes surgery. Her past medical history does not include ulcerative colitis.    Past Medical History:  Diagnosis Date  . Asthma   . DDD (degenerative disc disease)   . Diabetes mellitus without complication ([place])   . Hyperlipidemia   . Hypertension   . Right lower quadrant abdominal abscess ([place])   . SBO (small bowel obstruction) ([place]) 03/15/2014  . Spinal stenosis   . Type II or unspecified type diabetes mellitus without mention of complication, not stated as uncontrolled 03/22/2013  . Vitamin D deficiency     Patient Active Problem List   Diagnosis Date Noted  . Acute urinary retention 11/16/2014  . Recurrent appendicitis s/p lap appy 11/15/2014 11/15/2014  . Hypomagnesemia 03/24/2014  . Malnutrition of moderate degree ([place]) 03/19/2014  . Nausea and vomiting   . Hypokalemia   . Diabetes mellitus type 2, controlled ([place]) 02/27/2014  . CKD stage 2 due to type 2 diabetes mellitus ([place]) 12/25/2013  . Toe fracture, right 11/28/2013  . Encounter for long-term (current) use of medications 09/17/2013  .  Essential hypertension 03/22/2013  . Hyperlipidemia 03/22/2013  . Vitamin D deficiency 03/22/2013  . Extrinsic asthma, unspecified 03/22/2013    Past Surgical History:  Procedure Laterality Date  . CHOLECYSTECTOMY    . CYST EXCISION  back  . HERNIA REPAIR    . LAPAROSCOPIC APPENDECTOMY N/A 11/15/2014   Procedure: APPENDECTOMY LAPAROSCOPIC;  Surgeon: Michael Boston, MD;  Location: [facility] ORS;  Service: General;  Laterality: N/A;  . MINI-LAPAROTOMY W/ TUBAL LIGATION  1964     OB History   None      Home Medications    Prior to Admission medications   Medication Sig Start Date End Date Taking? Authorizing Provider  acetaminophen (TYLENOL) 325 MG tablet Take 2 tablets (650 mg total) by mouth every 6 (six) hours as needed for mild pain (or Fever >/= 101). 03/09/14   Rama, Venetia Maxon, MD  aspirin 325 MG tablet Take 325 mg by mouth daily.    [provider]  atenolol (TENORMIN) 100 MG tablet TAKE 1 TABLET BY MOUTH DAILY FOR BLOOD PRESSURE 12/02/13   Unk Pinto, MD  Cholecalciferol (VITAMIN D-3) 1000 UNITS CAPS Take 2,000 Units by mouth daily.     [provider]  Coenzyme Q10 200 MG capsule Take 200 mg by mouth daily.    [provider]  Cyanocobalamin (VITAMIN B-12) 2500 MCG SUBL Place 5,000 tablets under the tongue daily. Thursdays    [provider]  diltiazem (CARDIZEM) 120 MG tablet TAKE 1 TABLET BY  MOUTH TWICE DAILY FOR HIGH BLOOD PRESSURE 08/03/13   Vicie Mutters, PA-C  fenofibrate 160 MG tablet Take 160 mg by mouth daily.  11/03/14   [provider]  Flaxseed, Linseed, (FLAXSEED OIL) 1000 MG CAPS Take 1,000 mg by mouth 4 (four) times daily.    [provider]  FLUZONE HIGH-DOSE 0.5 ML SUSY  12/09/13   [provider]  FREESTYLE LITE test strip 1 each by Other route daily.  01/28/14   [provider]  hydrochlorothiazide (HYDRODIURIL) 25 MG tablet TAKE 1 TABLET BY MOUTH EVERY MORNING 01/09/15   Vicie Mutters,  PA-C  Lancets (FREESTYLE) lancets Test glucose 1 time daily 08/16/13   Unk Pinto, MD  Magnesium Oxide (MAG-OX PO) Take 500 mg by mouth daily.     [provider]  Multiple Vitamins-Minerals (MULTIVITAMIN & MINERAL PO) Take by mouth daily.    [provider]  naproxen sodium (ALEVE) 220 MG tablet Take 440-660 mg by mouth 2 (two) times daily as needed (pain).     [provider]  simvastatin (ZOCOR) 40 MG tablet TAKE 1 TABLET BY MOUTH EVERY NIGHT AT BEDTIME FOR CHOLESTEROL 08/03/13   Vicie Mutters, PA-C    Family History Family History  Problem Relation Age of Onset  . Heart attack Mother   . Hypertension Mother   . Heart attack Father   . Stroke Sister   . Breast cancer Sister   . Heart attack Brother        x 2  . Colon cancer Sister     Social History Social History   Tobacco Use  . Smoking status: Never Smoker  . Smokeless tobacco: Never Used  Substance Use Topics  . Alcohol use: No    Alcohol/week: 0.0 standard drinks  . Drug use: No     Allergies   Ace inhibitors; Crestor [rosuvastatin]; Metformin and related; Other; Penicillins; Grapefruit extract; and Minoxidil   Review of Systems Review of Systems  Constitutional: Negative for fever.  Respiratory: Negative for shortness of breath.   Cardiovascular: Negative for chest pain.  Gastrointestinal: Positive for abdominal pain, nausea and vomiting.  Genitourinary: Negative for dysuria.  All other systems reviewed and are negative.    Physical Exam Updated Vital Signs BP (!) 158/75 (BP Location: Left Arm)   Pulse 89   Temp 98.7 F (37.1 C) (Oral)   Resp 15   Ht 5\' 4"  (1.626 m)   Wt 78.3 kg   SpO2 98%   BMI 29.63 kg/m   Physical Exam  Constitutional: She is oriented to person, place, and time. She appears well-developed and well-nourished.  HENT:  Head: Normocephalic and atraumatic.  Mouth/Throat: Oropharynx is clear and moist.  Eyes: Pupils are equal, round, and  reactive to light. Conjunctivae are normal.  Neck: Normal range of motion. Neck supple.  Cardiovascular: Normal rate, regular rhythm, normal heart sounds and intact distal pulses.  Pulmonary/Chest: Effort normal and breath sounds normal. No stridor. She has no wheezes. She has no rales.  Abdominal: She exhibits no mass. Bowel sounds are increased. There is no rigidity, no rebound, no tenderness at McBurney's point and negative Murphy's sign. No hernia.  Musculoskeletal: Normal range of motion.  Neurological: She is alert and oriented to person, place, and time. She displays normal reflexes.  Skin: Skin is warm. Capillary refill takes less than 2 seconds. She is not diaphoretic.  Psychiatric: She has a normal mood and affect.     ED Treatments / Results  Labs (  all labs ordered are listed, but only abnormal results are displayed) Results for orders placed or performed during the hospital encounter of 12/31/17  Lipase, blood  Result Value Ref Range   Lipase 46 11 - 51 U/L  Comprehensive metabolic panel  Result Value Ref Range   Sodium 139 135 - 145 mmol/L   Potassium 3.0 (L) 3.5 - 5.1 mmol/L   Chloride 99 98 - 111 mmol/L   CO2 27 22 - 32 mmol/L   Glucose, Bld 180 (H) 70 - 99 mg/dL   BUN 35 (H) 8 - 23 mg/dL   Creatinine, Ser 1.00 0.44 - 1.00 mg/dL   Calcium 9.9 8.9 - 10.3 mg/dL   Total Protein 7.9 6.5 - 8.1 g/dL   Albumin 3.9 3.5 - 5.0 g/dL   AST 565 (H) 15 - 41 U/L   ALT 200 (H) 0 - 44 U/L   Alkaline Phosphatase 67 38 - 126 U/L   Total Bilirubin 1.3 (H) 0.3 - 1.2 mg/dL   GFR calc non Af Amer 50 (L) >60 mL/min   GFR calc Af Amer 57 (L) >60 mL/min   Anion gap 13 5 - 15  CBC  Result Value Ref Range   WBC 6.4 4.0 - 10.5 K/uL   RBC 3.75 (L) 3.87 - 5.11 MIL/uL   Hemoglobin 11.4 (L) 12.0 - 15.0 g/dL   HCT 35.2 (L) 36.0 - 46.0 %   MCV 93.9 80.0 - 100.0 fL   MCH 30.4 26.0 - 34.0 pg   MCHC 32.4 30.0 - 36.0 g/dL   RDW 14.6 11.5 - 15.5 %   Platelets 233 150 - 400 K/uL   nRBC 0.0 0.0  - 0.2 %  I-Stat Chem 8, ED  Result Value Ref Range   Sodium 139 135 - 145 mmol/L   Potassium 3.0 (L) 3.5 - 5.1 mmol/L   Chloride 99 98 - 111 mmol/L   BUN 30 (H) 8 - 23 mg/dL   Creatinine, Ser 1.00 0.44 - 1.00 mg/dL   Glucose, Bld 190 (H) 70 - 99 mg/dL   Calcium, Ion 1.16 1.15 - 1.40 mmol/L   TCO2 27 22 - 32 mmol/L   Hemoglobin 12.6 12.0 - 15.0 g/dL   HCT 37.0 36.0 - 46.0 %  I-stat troponin, ED  Result Value Ref Range   Troponin i, poc 0.02 0.00 - 0.08 ng/mL   Comment 3           Ct Renal Stone Study  Result Date: 12/31/2017 CLINICAL DATA:  Right scapular pain radiating beneath the ribs. Umbilical pain. EXAM: CT ABDOMEN AND PELVIS WITHOUT CONTRAST TECHNIQUE: Multidetector CT imaging of the abdomen and pelvis was performed following the standard protocol without IV contrast. COMPARISON:  11/15/2014 FINDINGS: LOWER CHEST: There is no basilar pleural or apical pericardial effusion. HEPATOBILIARY: Intrahepatic and extrahepatic biliary dilatation minimally decreased compared to 11/15/2014. Common bile duct measures 14 mm. No focal abnormal density of the liver. Status post cholecystectomy. PANCREAS: The pancreatic parenchymal contours are normal and there is no ductal dilatation. There is no peripancreatic fluid collection. SPLEEN: Normal. ADRENALS/URINARY TRACT: --Adrenal glands: Normal. --Right kidney/ureter: No hydronephrosis, nephroureterolithiasis, perinephric stranding or solid renal mass. --Left kidney/ureter: No hydronephrosis, nephroureterolithiasis, perinephric stranding or solid renal mass. --Urinary bladder: Normal for degree of distention STOMACH/BOWEL: --Stomach/Duodenum: There is no hiatal hernia or other gastric abnormality. The duodenal course and caliber are normal. --Small bowel: No dilatation or inflammation. --Colon: No focal abnormality. --Appendix: Normal. VASCULAR/LYMPHATIC: Atherosclerotic calcification is present within the non-aneurysmal  abdominal aorta, without  hemodynamically significant stenosis. No abdominal or pelvic lymphadenopathy. REPRODUCTIVE: Normal uterus and ovaries. MUSCULOSKELETAL. Grade 1 anterolisthesis at L3-4 and L4-5 secondary to facet hypertrophy. OTHER: None. IMPRESSION: 1. No obstructive uropathy or nephrolithiasis. 2. No acute abnormality of the abdomen or pelvis. 3. Persistent biliary dilatation, minimally increased since 11/15/2014 and likely due to post cholecystectomy status. 4.  Aortic Atherosclerosis (ICD10-I70.0). Electronically Signed   By: Ulyses Jarred M.D.   On: 12/31/2017 06:16    EKG None  Radiology Ct Renal Stone Study  Result Date: 12/31/2017 CLINICAL DATA:  Right scapular pain radiating beneath the ribs. Umbilical pain. EXAM: CT ABDOMEN AND PELVIS WITHOUT CONTRAST TECHNIQUE: Multidetector CT imaging of the abdomen and pelvis was performed following the standard protocol without IV contrast. COMPARISON:  11/15/2014 FINDINGS: LOWER CHEST: There is no basilar pleural or apical pericardial effusion. HEPATOBILIARY: Intrahepatic and extrahepatic biliary dilatation minimally decreased compared to 11/15/2014. Common bile duct measures 14 mm. No focal abnormal density of the liver. Status post cholecystectomy. PANCREAS: The pancreatic parenchymal contours are normal and there is no ductal dilatation. There is no peripancreatic fluid collection. SPLEEN: Normal. ADRENALS/URINARY TRACT: --Adrenal glands: Normal. --Right kidney/ureter: No hydronephrosis, nephroureterolithiasis, perinephric stranding or solid renal mass. --Left kidney/ureter: No hydronephrosis, nephroureterolithiasis, perinephric stranding or solid renal mass. --Urinary bladder: Normal for degree of distention STOMACH/BOWEL: --Stomach/Duodenum: There is no hiatal hernia or other gastric abnormality. The duodenal course and caliber are normal. --Small bowel: No dilatation or inflammation. --Colon: No focal abnormality. --Appendix: Normal. VASCULAR/LYMPHATIC: Atherosclerotic  calcification is present within the non-aneurysmal abdominal aorta, without hemodynamically significant stenosis. No abdominal or pelvic lymphadenopathy. REPRODUCTIVE: Normal uterus and ovaries. MUSCULOSKELETAL. Grade 1 anterolisthesis at L3-4 and L4-5 secondary to facet hypertrophy. OTHER: None. IMPRESSION: 1. No obstructive uropathy or nephrolithiasis. 2. No acute abnormality of the abdomen or pelvis. 3. Persistent biliary dilatation, minimally increased since 11/15/2014 and likely due to post cholecystectomy status. 4.  Aortic Atherosclerosis (ICD10-I70.0). Electronically Signed   By: Ulyses Jarred M.D.   On: 12/31/2017 06:16    Procedures Procedures (including critical care time)  Medications Ordered in ED Medications  fentaNYL (SUBLIMAZE) injection 100 mcg (0 mcg Intravenous Hold 12/31/17 0623)  ketorolac (TORADOL) 30 MG/ML injection 15 mg (has no administration in time range)  gi cocktail (Maalox,Lidocaine,Donnatal) (has no administration in time range)  dicyclomine (BENTYL) injection 20 mg (has no administration in time range)  potassium chloride 10 mEq in 100 mL IVPB (has no administration in time range)  fentaNYL (SUBLIMAZE) injection 50 mcg (50 mcg Intravenous Given 12/31/17 0442)      Final Clinical Impressions(s) / ED Diagnoses   Final diagnoses:  Hepatitis  Nausea and vomiting, intractability of vomiting not specified, unspecified vomiting type  Right upper quadrant abdominal pain    Hepatitis and RUQ pain in the setting of no gall bladder. No elevation of lipase.  Pain is intractable at this time.     Philippe Gang, MD 12/31/17 [phone]

## 2017-12-31 NOTE — ED Notes (Signed)
Hospitalist at bedside with patient and family. RN will transport upstairs ASAP.

## 2018-01-01 DIAGNOSIS — R112 Nausea with vomiting, unspecified: Secondary | ICD-10-CM

## 2018-01-01 DIAGNOSIS — R52 Pain, unspecified: Secondary | ICD-10-CM

## 2018-01-01 DIAGNOSIS — K759 Inflammatory liver disease, unspecified: Secondary | ICD-10-CM

## 2018-01-01 DIAGNOSIS — K831 Obstruction of bile duct: Secondary | ICD-10-CM

## 2018-01-01 LAB — CBC
HCT: 32.9 % — ABNORMAL LOW (ref 36.0–46.0)
Hemoglobin: 10.7 g/dL — ABNORMAL LOW (ref 12.0–15.0)
MCH: 30.7 pg (ref 26.0–34.0)
MCHC: 32.5 g/dL (ref 30.0–36.0)
MCV: 94.5 fL (ref 80.0–100.0)
Platelets: 173 10*3/uL (ref 150–400)
RBC: 3.48 MIL/uL — ABNORMAL LOW (ref 3.87–5.11)
RDW: 15.1 % (ref 11.5–15.5)
WBC: 13.6 10*3/uL — AB (ref 4.0–10.5)
nRBC: 0 % (ref 0.0–0.2)

## 2018-01-01 LAB — COMPREHENSIVE METABOLIC PANEL
ALBUMIN: 3.1 g/dL — AB (ref 3.5–5.0)
ALT: 164 U/L — ABNORMAL HIGH (ref 0–44)
AST: 211 U/L — AB (ref 15–41)
Alkaline Phosphatase: 61 U/L (ref 38–126)
Anion gap: 11 (ref 5–15)
BUN: 18 mg/dL (ref 8–23)
CHLORIDE: 103 mmol/L (ref 98–111)
CO2: 23 mmol/L (ref 22–32)
Calcium: 9.2 mg/dL (ref 8.9–10.3)
Creatinine, Ser: 0.78 mg/dL (ref 0.44–1.00)
GFR calc Af Amer: >60 mL/min (ref >60)
GLUCOSE: 88 mg/dL (ref 70–99)
Potassium: 3.7 mmol/L (ref 3.5–5.1)
SODIUM: 137 mmol/L (ref 135–145)
Total Bilirubin: 2.8 mg/dL — ABNORMAL HIGH (ref 0.3–1.2)
Total Protein: 6.8 g/dL (ref 6.5–8.1)

## 2018-01-01 LAB — GLUCOSE, CAPILLARY
GLUCOSE-CAPILLARY: 106 mg/dL — AB (ref 70–99)
Glucose-Capillary: 94 mg/dL (ref 70–99)

## 2018-01-01 LAB — HEPATITIS PANEL, ACUTE
Hep A IgM: NEGATIVE
Hep B C IgM: NEGATIVE
Hepatitis B Surface Ag: NEGATIVE

## 2018-01-01 MED ORDER — LOPERAMIDE HCL 2 MG PO CAPS
2.0000 mg | ORAL_CAPSULE | Freq: Four times a day (QID) | ORAL | Status: DC | PRN
  Administered 2018-01-01: 2 mg via ORAL
  Filled 2018-01-01: qty 1

## 2018-01-01 NOTE — Discharge Summary (Signed)
Physician Discharge Summary  Kristine Burnett:[id number] DOB: 1929-08-25 DOA: 12/31/2017  PCP: Willey Blade, MD  Admit date: 12/31/2017 Discharge date: 01/01/2018  Admitted From home Disposition: Home Recommendations for Outpatient Follow-up:  1. Follow up with PCP in 1-2 weeks 2. Please obtain CMP/CBC in one week Home Health: None Equipment/Devices none  Discharge Condition stable CODE STATUS: Full code Diet recommendation: Cardiac Brief/Interim Summary:84 y.o. female with medical history significant of T2DM, HTN, HLD, SBO, s/p appendectomy, s/p cholecystectomy, and multiple other medical problems presenting with abdominal pain.  She notes her pain started last night after she ate a hot dog.  She was unable to rest because of the pain.  She vomited undigested food.  She describes the pain is being across the top of her abdomen most in the epigastric region but also radiating to her back.  She could not get comfortable because the pain.  She describes the pain as achy, coming and going.  She notes that the pain has only recently subsides up subsided after she got to the emergency department after she urinated.  She denies any fevers, chills, cough, cold, chest pain, shortness of breath, pain with urination, recent travel, sick contacts.  She takes 650 mg APAP twice daily.  Denies any smoking, drinking,  or other drugs.   ED Course: Labs, imaging.  Admit for elevated liver enzymes.  Discharge Diagnoses:  Active Problems:   Biliary obstruction   Elevated liver enzymes  #1 abdominal pain patient admitted with abdominal pain with elevated AST and ALT which has come down with IV hydration.  Patient has a history of cholecystectomy.  She already ate a good breakfast with no nausea vomiting abdominal pain diarrhea.  UA was negative for infection.  Morning she already had a breakfast without any complaints.  She is eager and anxious to go home.  I will discharge her home on all her home  medications except statin and fenofibrate.  Patient will follow-up with her PCP within 1 week and recheck CBC and CMP and restart it if needed.  #2 hypertension stable on amlodipine atenolol diltiazem restart valsartan hydrochlorothiazide.  #3 overactive bladder continue oxybutynin  #4 hypokalemia resolved.  #5 diabetes not on any medicines at home   Discharge Instructions  Discharge Instructions    Call MD for:  difficulty breathing, headache or visual disturbances   Complete by:  As directed    Call MD for:  persistant nausea and vomiting   Complete by:  As directed    Diet - low sodium heart healthy   Complete by:  As directed    Increase activity slowly   Complete by:  As directed      Allergies as of 01/01/2018      Reactions   Ace Inhibitors Cough   Enalapril OK   Crestor [rosuvastatin] Other (See Comments)   Legs cramp   Metformin And Related Diarrhea   Penicillins Other (See Comments)   unknown Has patient had a PCN reaction causing immediate rash, facial/tongue/throat swelling, SOB or lightheadedness with hypotension: No Has patient had a PCN reaction causing severe rash involving mucus membranes or skin necrosis: No Has patient had a PCN reaction that required hospitalization: No Has patient had a PCN reaction occurring within the last 10 years: No If all of the above answers are "NO", then may proceed with Cephalosporin use.   Grapefruit Extract Rash   Minoxidil Other (See Comments)   Grow hair on face      Medication  List    STOP taking these medications   fenofibrate 160 MG tablet   rosuvastatin 20 MG tablet Commonly known as:  CRESTOR     TAKE these medications   amLODipine 10 MG tablet Commonly known as:  NORVASC Take 10 mg by mouth daily.   aspirin EC 81 MG tablet Take 81 mg by mouth daily.   atenolol 100 MG tablet Commonly known as:  TENORMIN TAKE 1 TABLET BY MOUTH DAILY FOR BLOOD PRESSURE   cilostazol 50 MG tablet Commonly known as:   PLETAL Take 50 mg by mouth daily.   Coenzyme Q10 200 MG capsule Take 200 mg by mouth daily.   diltiazem 120 MG tablet Commonly known as:  CARDIZEM TAKE 1 TABLET BY MOUTH TWICE DAILY FOR HIGH BLOOD PRESSURE What changed:  See the new instructions.   Flaxseed Oil 1000 MG Caps Take 1,000 mg by mouth 4 (four) times daily.   freestyle lancets Test glucose 1 time daily   MAG-OX PO Take 1 tablet by mouth daily.   oxybutynin 5 MG tablet Commonly known as:  DITROPAN Take 5 mg by mouth 2 (two) times daily.   valsartan-hydrochlorothiazide 160-25 MG tablet Commonly known as:  DIOVAN-HCT Take 1 tablet by mouth daily.   Vitamin D-3 1000 units Caps Take 2,000 Units by mouth daily.      Follow-up Information    Willey Blade, MD Follow up.   Specialty:  Internal Medicine Why:  Fenofibrate and Crestor has been stopped due to elevated LFTs.  Her CMP needs to be reevaluated this week.  Consider restarting fenofibrate and Crestor if liver function test is back to normal. Contact information: [place] [address] [phone]          Allergies  Allergen Reactions  . Ace Inhibitors Cough    Enalapril OK  . Crestor [Rosuvastatin] Other (See Comments)    Legs cramp   . Metformin And Related Diarrhea  . Penicillins Other (See Comments)    unknown Has patient had a PCN reaction causing immediate rash, facial/tongue/throat swelling, SOB or lightheadedness with hypotension: No Has patient had a PCN reaction causing severe rash involving mucus membranes or skin necrosis: No Has patient had a PCN reaction that required hospitalization: No Has patient had a PCN reaction occurring within the last 10 years: No If all of the above answers are "NO", then may proceed with Cephalosporin use.   . Grapefruit Extract Rash  . Minoxidil Other (See Comments)    Grow hair on face    Consultations: None Procedures/Studies: Mr 3d Recon At Scanner  Result  Date: 12/31/2017 CLINICAL DATA:  Pain.  Abnormal liver function test. EXAM: MRI ABDOMEN WITHOUT AND WITH CONTRAST (INCLUDING MRCP) TECHNIQUE: Multiplanar multisequence MR imaging of the abdomen was performed both before and after the administration of intravenous contrast. Heavily T2-weighted images of the biliary and pancreatic ducts were obtained, and three-dimensional MRCP images were rendered by post processing. CONTRAST:  8 cc Gadavist. COMPARISON:  CT AP 12/31/2017 and 11/15/2014 FINDINGS: Lower chest: No acute findings. Hepatobiliary: Mild hepatic steatosis. There is significant motion artifact which diminishes exam detail. Within this limitation no suspicious enhancing liver abnormality identified. Previous cholecystectomy. Chronic moderate intrahepatic bile duct dilatation and marked common bile duct dilatation identified. The common bile duct measures 1.3 cm. No choledocholithiasis identified. Pancreas: Normal caliber of the main duct. Within the limitations of motion artifact no pancreatic mass or inflammation identified. Spleen:  Within normal limits in size and  appearance. Adrenals/Urinary Tract: Normal adrenal glands. No hydronephrosis identified bilaterally. Several cysts identified within the right kidney. These are difficult to characterize secondary to motion artifact. Stomach/Bowel: Visualized portions within the abdomen are unremarkable. Vascular/Lymphatic: No pathologically enlarged lymph nodes identified. No abdominal aortic aneurysm demonstrated. Other: There is trace ascites identified within the right upper quadrant of the abdomen. Musculoskeletal: No suspicious bone lesions identified. IMPRESSION: 1. Significantly diminished exam detail secondary to respiratory motion artifact. 2. Chronic increase caliber of the common bile duct with mild intrahepatic biliary dilatation status post cholecystectomy. This dates back to 11/15/14. No obstructing mass or choledocholithiasis identified. If there  is a clinical concern for common bile duct obstruction then further evaluation with ERCP may be helpful. Additionally, contrast enhanced pancreas protocol CT of the abdomen may be less susceptible to respiratory motion artifact. 3. Mild hepatic steatosis. 4. Right kidney cysts, difficult to characterize due to motion artifact. Electronically Signed   By: Kerby Moors M.D.   On: 12/31/2017 12:34   Mr Abdomen Mrcp Moise Boring Contast  Result Date: 12/31/2017 CLINICAL DATA:  Pain.  Abnormal liver function test. EXAM: MRI ABDOMEN WITHOUT AND WITH CONTRAST (INCLUDING MRCP) TECHNIQUE: Multiplanar multisequence MR imaging of the abdomen was performed both before and after the administration of intravenous contrast. Heavily T2-weighted images of the biliary and pancreatic ducts were obtained, and three-dimensional MRCP images were rendered by post processing. CONTRAST:  8 cc Gadavist. COMPARISON:  CT AP 12/31/2017 and 11/15/2014 FINDINGS: Lower chest: No acute findings. Hepatobiliary: Mild hepatic steatosis. There is significant motion artifact which diminishes exam detail. Within this limitation no suspicious enhancing liver abnormality identified. Previous cholecystectomy. Chronic moderate intrahepatic bile duct dilatation and marked common bile duct dilatation identified. The common bile duct measures 1.3 cm. No choledocholithiasis identified. Pancreas: Normal caliber of the main duct. Within the limitations of motion artifact no pancreatic mass or inflammation identified. Spleen:  Within normal limits in size and appearance. Adrenals/Urinary Tract: Normal adrenal glands. No hydronephrosis identified bilaterally. Several cysts identified within the right kidney. These are difficult to characterize secondary to motion artifact. Stomach/Bowel: Visualized portions within the abdomen are unremarkable. Vascular/Lymphatic: No pathologically enlarged lymph nodes identified. No abdominal aortic aneurysm demonstrated. Other:  There is trace ascites identified within the right upper quadrant of the abdomen. Musculoskeletal: No suspicious bone lesions identified. IMPRESSION: 1. Significantly diminished exam detail secondary to respiratory motion artifact. 2. Chronic increase caliber of the common bile duct with mild intrahepatic biliary dilatation status post cholecystectomy. This dates back to 11/15/14. No obstructing mass or choledocholithiasis identified. If there is a clinical concern for common bile duct obstruction then further evaluation with ERCP may be helpful. Additionally, contrast enhanced pancreas protocol CT of the abdomen may be less susceptible to respiratory motion artifact. 3. Mild hepatic steatosis. 4. Right kidney cysts, difficult to characterize due to motion artifact. Electronically Signed   By: Kerby Moors M.D.   On: 12/31/2017 12:34   Ct Renal Stone Study  Result Date: 12/31/2017 CLINICAL DATA:  Right scapular pain radiating beneath the ribs. Umbilical pain. EXAM: CT ABDOMEN AND PELVIS WITHOUT CONTRAST TECHNIQUE: Multidetector CT imaging of the abdomen and pelvis was performed following the standard protocol without IV contrast. COMPARISON:  11/15/2014 FINDINGS: LOWER CHEST: There is no basilar pleural or apical pericardial effusion. HEPATOBILIARY: Intrahepatic and extrahepatic biliary dilatation minimally decreased compared to 11/15/2014. Common bile duct measures 14 mm. No focal abnormal density of the liver. Status post cholecystectomy. PANCREAS: The pancreatic parenchymal contours are normal and  there is no ductal dilatation. There is no peripancreatic fluid collection. SPLEEN: Normal. ADRENALS/URINARY TRACT: --Adrenal glands: Normal. --Right kidney/ureter: No hydronephrosis, nephroureterolithiasis, perinephric stranding or solid renal mass. --Left kidney/ureter: No hydronephrosis, nephroureterolithiasis, perinephric stranding or solid renal mass. --Urinary bladder: Normal for degree of distention  STOMACH/BOWEL: --Stomach/Duodenum: There is no hiatal hernia or other gastric abnormality. The duodenal course and caliber are normal. --Small bowel: No dilatation or inflammation. --Colon: No focal abnormality. --Appendix: Normal. VASCULAR/LYMPHATIC: Atherosclerotic calcification is present within the non-aneurysmal abdominal aorta, without hemodynamically significant stenosis. No abdominal or pelvic lymphadenopathy. REPRODUCTIVE: Normal uterus and ovaries. MUSCULOSKELETAL. Grade 1 anterolisthesis at L3-4 and L4-5 secondary to facet hypertrophy. OTHER: None. IMPRESSION: 1. No obstructive uropathy or nephrolithiasis. 2. No acute abnormality of the abdomen or pelvis. 3. Persistent biliary dilatation, minimally increased since 11/15/2014 and likely due to post cholecystectomy status. 4.  Aortic Atherosclerosis (ICD10-I70.0). Electronically Signed   By: Ulyses Jarred M.D.   On: 12/31/2017 06:16    (Echo, Carotid, EGD, Colonoscopy, ERCP)    Subjective:   Discharge Exam: Vitals:   12/31/17 2036 01/01/18 0507  BP: 128/60 (!) 146/72  Pulse: 63 84  Resp: 18 14  Temp: 98.5 F (36.9 C) 99 F (37.2 C)  SpO2: 98% 97%   Vitals:   12/31/17 0845 12/31/17 1310 12/31/17 2036 01/01/18 0507  BP: 132/66 134/62 128/60 (!) 146/72  Pulse: (!) 102 88 63 84  Resp: 20 20 18 14   Temp: 100.1 F (37.8 C) 98.4 F (36.9 C) 98.5 F (36.9 C) 99 F (37.2 C)  TempSrc: Oral Oral Oral Oral  SpO2: 98% 98% 98% 97%  Weight:      Height:        General: Pt is alert, awake, not in acute distress Cardiovascular: RRR, S1/S2 +, no rubs, no gallops Respiratory: CTA bilaterally, no wheezing, no rhonchi Abdominal: Soft, NT, ND, bowel sounds + Extremities: no edema, no cyanosis    The results of significant diagnostics from this hospitalization (including imaging, microbiology, ancillary and laboratory) are listed below for reference.     Microbiology: No results found for this or any previous visit (from the past  240 hour(s)).   Labs: BNP (last 3 results) No results for input(s): BNP in the last 8760 hours. Basic Metabolic Panel: Recent Labs  Lab 12/31/17 0416 12/31/17 0426 12/31/17 0909 01/01/18 0521  NA 139 139  --  137  K 3.0* 3.0*  --  3.7  CL 99 99  --  103  CO2 27  --   --  23  GLUCOSE 180* 190*  --  88  BUN 35* 30*  --  18  CREATININE 1.00 1.00  --  0.78  CALCIUM 9.9  --   --  9.2  MG  --   --  1.4*  --    Liver Function Tests: Recent Labs  Lab 12/31/17 0416 01/01/18 0521  AST 565* 211*  ALT 200* 164*  ALKPHOS 67 61  BILITOT 1.3* 2.8*  PROT 7.9 6.8  ALBUMIN 3.9 3.1*   Recent Labs  Lab 12/31/17 0416  LIPASE 46   No results for input(s): AMMONIA in the last 168 hours. CBC: Recent Labs  Lab 12/31/17 0416 12/31/17 0426 01/01/18 0521  WBC 6.4  --  13.6*  HGB 11.4* 12.6 10.7*  HCT 35.2* 37.0 32.9*  MCV 93.9  --  94.5  PLT 233  --  173   Cardiac Enzymes: No results for input(s): CKTOTAL, CKMB, CKMBINDEX, TROPONINI in the  last 168 hours. BNP: Invalid input(s): POCBNP CBG: Recent Labs  Lab 12/31/17 1226 12/31/17 1705 12/31/17 2033 01/01/18 0751  GLUCAP 143* 129* 116* 94   D-Dimer No results for input(s): DDIMER in the last 72 hours. Hgb A1c Recent Labs    12/31/17 0416  HGBA1C 6.4*   Lipid Profile No results for input(s): CHOL, HDL, LDLCALC, TRIG, CHOLHDL, LDLDIRECT in the last 72 hours. Thyroid function studies No results for input(s): TSH, T4TOTAL, T3FREE, THYROIDAB in the last 72 hours.  Invalid input(s): FREET3 Anemia work up No results for input(s): VITAMINB12, FOLATE, FERRITIN, TIBC, IRON, RETICCTPCT in the last 72 hours. Urinalysis    Component Value Date/Time   COLORURINE YELLOW 12/31/2017 0624   APPEARANCEUR HAZY (A) 12/31/2017 0624   LABSPEC 1.012 12/31/2017 0624   PHURINE 8.0 12/31/2017 0624   GLUCOSEU 150 (A) 12/31/2017 0624   HGBUR NEGATIVE 12/31/2017 0624   BILIRUBINUR NEGATIVE 12/31/2017 0624   KETONESUR NEGATIVE  12/31/2017 0624   PROTEINUR 30 (A) 12/31/2017 0624   UROBILINOGEN 0.2 11/15/2014 0650   NITRITE NEGATIVE 12/31/2017 0624   LEUKOCYTESUR NEGATIVE 12/31/2017 0624   Sepsis Labs Invalid input(s): PROCALCITONIN,  WBC,  LACTICIDVEN Microbiology No results found for this or any previous visit (from the past 240 hour(s)).   Time coordinating discharge:  34 minutes  SIGNED:   Georgette Shell, MD  [facility] 01/01/2018, 9:52 AM Pager   If 7PM-7AM, please contact night-coverage [email or web address] Password [email or web address]

## 2018-01-02 ENCOUNTER — Ambulatory Visit
Admission: RE | Admit: 2018-01-02 | Discharge: 2018-01-02 | Disposition: A | Discharge status: Home / Self Care | Payer: Medicare HMO | Source: Ambulatory Visit | Attending: Internal Medicine | Admitting: Internal Medicine

## 2018-01-02 DIAGNOSIS — Z1231 Encounter for screening mammogram for malignant neoplasm of breast: Secondary | ICD-10-CM

## 2018-01-02 DIAGNOSIS — E2839 Other primary ovarian failure: Secondary | ICD-10-CM

## 2018-05-02 ENCOUNTER — Ambulatory Visit: Payer: Medicare HMO | Admitting: Cardiology

## 2018-05-02 ENCOUNTER — Encounter: Payer: Self-pay | Admitting: Cardiology

## 2018-05-02 VITALS — BP 156/77 | HR 78 | Wt 173.1 lb

## 2018-05-02 DIAGNOSIS — I739 Peripheral vascular disease, unspecified: Secondary | ICD-10-CM

## 2018-05-02 DIAGNOSIS — I10 Essential (primary) hypertension: Secondary | ICD-10-CM

## 2018-05-02 DIAGNOSIS — R0989 Other specified symptoms and signs involving the circulatory and respiratory systems: Secondary | ICD-10-CM

## 2018-05-02 DIAGNOSIS — E782 Mixed hyperlipidemia: Secondary | ICD-10-CM

## 2018-05-02 MED ORDER — ROSUVASTATIN CALCIUM 20 MG PO TABS: 20.0000 mg | ORAL_TABLET | Freq: Every day | ORAL | 3 refills | Status: DC

## 2018-05-02 NOTE — Patient Instructions (Signed)
Stop fenofibrate. Continue crestor.  BP check visit when you come for carotid ultrasound. Then, follow up with me in 6 months with repeat labs.

## 2018-05-02 NOTE — Progress Notes (Signed)
Patient is here for follow up visit.  Subjective:   @Patient  ID: Kristine Burnett, female    DOB: 05/22/1929, 85 y.o.   MRN: [id number]  Chief Complaint  Patient presents with  . PAD    3 month F/U    HPI   85 year old African American female with hypertension, stable bilateral claudication, for which she has opted for conservative medical treatment. She is here for 3 month follow-up  Patient was admitted to the hospital for two days in 12/2017 with abdominal pain. She has had cholecystectomy and appendectomy before. She had elevated liver enzymes, which was thought to be due to biliary obstruction. Thus, her statin was stopped. Follow up CMP showed normalization of liver enzymes.  Patient is here for 3 month follow up. She is doing well and does not have any new complaints. She still enjoys her church activities, and is still driving. She has stable, unchanged claudication symptoms which are medically managed. BP is elevated today. Patient states her BP is much lower at home readings.   LFT 04/17/2017: T. Bili normal. Albumin 4.7 AST/ALT 30/16    Past Medical History:  Diagnosis Date  . Abnormal EKG   . Asthma   . Carotid artery occlusion   . DDD (degenerative disc disease)   . Diabetes mellitus without complication ([place])   . Hyperlipidemia   . Hypertension   . PAD (peripheral artery disease) ([place])   . Right lower quadrant abdominal abscess ([place])   . SBO (small bowel obstruction) ([place]) 03/15/2014  . Spinal stenosis   . Type II or unspecified type diabetes mellitus without mention of complication, not stated as uncontrolled 03/22/2013  . Vitamin D deficiency     Past Surgical History:  Procedure Laterality Date  . CHOLECYSTECTOMY    . CYST EXCISION  back  . HERNIA REPAIR    . LAPAROSCOPIC APPENDECTOMY N/A 11/15/2014   Procedure: APPENDECTOMY LAPAROSCOPIC;  Surgeon: Michael Boston, MD;  Location: [facility];  Service: General;  Laterality: N/A;  . MINI-LAPAROTOMY W/ TUBAL  LIGATION  1964    Social History   Socioeconomic History  . Marital status: Widowed    Spouse name: Not on file  . Number of children: 3  . Years of education: Not on file  . Highest education level: Not on file  Occupational History  . Not on file  Social Needs  . Financial resource strain: Not on file  . Food insecurity:    Worry: Not on file    Inability: Not on file  . Transportation needs:    Medical: Not on file    Non-medical: Not on file  Tobacco Use  . Smoking status: Never Smoker  . Smokeless tobacco: Never Used  Substance and Sexual Activity  . Alcohol use: No    Alcohol/week: 0.0 standard drinks  . Drug use: No  . Sexual activity: Not on file  Lifestyle  . Physical activity:    Days per week: Not on file    Minutes per session: Not on file  . Stress: Not on file  Relationships  . Social connections:    Talks on phone: Not on file    Gets together: Not on file    Attends religious service: Not on file    Active member of club or organization: Not on file    Attends meetings of clubs or organizations: Not on file    Relationship status: Not on file  . Intimate partner violence:    Fear  of current or ex partner: Not on file    Emotionally abused: Not on file    Physically abused: Not on file    Forced sexual activity: Not on file  Other Topics Concern  . Not on file  Social History Narrative  . Not on file    Current Outpatient Medications on File Prior to Visit  Medication Sig Dispense Refill  . amLODipine (NORVASC) 10 MG tablet Take 10 mg by mouth daily.    Marland Kitchen aspirin EC 81 MG tablet Take 81 mg by mouth daily.    Marland Kitchen atenolol (TENORMIN) 100 MG tablet TAKE 1 TABLET BY MOUTH DAILY FOR BLOOD PRESSURE 90 tablet 1  . Cholecalciferol (VITAMIN D-3) 1000 UNITS CAPS Take 2,000 Units by mouth daily.     . cilostazol (PLETAL) 50 MG tablet Take 50 mg by mouth daily.    . Coenzyme Q10 200 MG capsule Take 200 mg by mouth daily.    Marland Kitchen diltiazem (CARDIZEM) 120 MG  tablet TAKE 1 TABLET BY MOUTH TWICE DAILY FOR HIGH BLOOD PRESSURE (Patient taking differently: Take 120 mg by mouth daily. ) 180 tablet 98  . Flaxseed, Linseed, (FLAXSEED OIL) 1000 MG CAPS Take 1,000 mg by mouth 4 (four) times daily.    . Lancets (FREESTYLE) lancets Test glucose 1 time daily 100 each 1  . Magnesium Oxide (MAG-OX PO) Take 1 tablet by mouth daily.     Marland Kitchen oxybutynin (DITROPAN) 5 MG tablet Take 5 mg by mouth 2 (two) times daily.    . valsartan-hydrochlorothiazide (DIOVAN-HCT) 160-25 MG tablet Take 1 tablet by mouth daily.     No current facility-administered medications on file prior to visit.     Cardiovascular studies:  Lower extremity segmental Dopplers 09/30/2016:  Right ABI 0.74, left ABI 0.70.  Segmental examination demonstrates evidence of femoral, radial and tibial disease including likely SFA or popliteal occlusion on the right and left.  Echocardiogram 11/03/2016: Left ventricle cavity is normal in size. Mild concentric remodeling of the left ventricle. Normal global wall motion. Visual EF is 50-55%. Calculated EF 55%. Mild biatrial dilatation.  Mild mitral, tricuspid, and pulmonic regurgitation Pulmonary artery systolic pressure is estimated at 30-35 mm Hg.   Lexiscan myoview stress test 10/31/2016: 1. The resting electrocardiogram demonstrated normal sinus rhythm, normal resting conduction, no resting arrhythmias and nonspecific T changes.  Stress EKG is non-diagnostic for ischemia as it a pharmacologic stress using Lexiscan. Stress symptoms included dizziness.  2.  Left ventricular cavity is noted to be normal on the rest and stress studies.  SPECT images demonstrate homogeneous tracer distribution throughout the myocardium.  The left ventricular ejection fraction was calculated to be 34% with global hypokinesis.  Findings may represent non ischemic cardiomyopathy. This is an intermediate risk study, clinical correlation recommended.   Review of Systems    Constitution: Negative for decreased appetite, malaise/fatigue, weight gain and weight loss.  HENT: Negative for congestion.   Eyes: Negative for visual disturbance.  Cardiovascular: Positive for claudication (Stable). Negative for chest pain, dyspnea on exertion, leg swelling, palpitations and syncope.  Respiratory: Negative for shortness of breath.   Endocrine: Negative for cold intolerance.  Hematologic/Lymphatic: Does not bruise/bleed easily.  Skin: Negative for itching and rash.  Musculoskeletal: Negative for myalgias.  Gastrointestinal: Negative for abdominal pain, nausea and vomiting.  Genitourinary: Negative for dysuria.  Neurological: Negative for dizziness and weakness.  Psychiatric/Behavioral: The patient is not nervous/anxious.   All other systems reviewed and are negative.      Objective:  Vitals:   05/02/18 1009  BP: (!) 156/77  Pulse: 78  SpO2: 96%     Physical Exam  Constitutional: She is oriented to person, place, and time. She appears well-developed and well-nourished. No distress.  HENT:  Head: Normocephalic and atraumatic.  Eyes: Pupils are equal, round, and reactive to light. Conjunctivae are normal.  Neck: No JVD present.  Cardiovascular: Normal rate and regular rhythm.  Murmur (II/VI ESM RUSB) heard. Pulses:      Carotid pulses are on the left side with bruit.      Dorsalis pedis pulses are 0 on the right side and 0 on the left side.       Posterior tibial pulses are 0 on the right side and 0 on the left side.  No signs of critical limb iscehmia  Pulmonary/Chest: Effort normal and breath sounds normal. She has no wheezes. She has no rales.  Abdominal: Soft. Bowel sounds are normal. There is no rebound.  Musculoskeletal:        General: No edema.  Lymphadenopathy:    She has no cervical adenopathy.  Neurological: She is alert and oriented to person, place, and time. No cranial nerve deficit.  Skin: Skin is warm and dry.  Psychiatric: She has a  normal mood and affect.  Nursing note and vitals reviewed.      Assessment & Recommendations:   85 year old [place] American female with hypertension, stable bilateral claudication, for which she has opted for conservative medical treatment. She is here for 3 month follow-up    ICD-10-CM   1. PAD (peripheral artery disease) (HCC) I73.9 rosuvastatin (CRESTOR) 20 MG tablet  2. Left carotid bruit R09.89 PCV CAROTID DUPLEX (BILATERAL)  3. Essential hypertension I10 Comprehensive Metabolic Panel (CMET)  4. Mixed hyperlipidemia E78.2 Lipid Profile   PAD: Stable claudication, no resting iscehmia.Patient would like to continue conservative medical therapy. LFT's have normalized. Continue crestor 20 mg daily. Stop fenofibrate given concurrent use of crestor. Continue aspirin 81 mg daily. Continue BP control.  I will check liver enzymes today. If normal, resume statin. Repeat liver enzymes in 3 months. I will see her back in 05/2017.  Hypertension: Suboptimal control. BP has historically been lower at prior office visits, as well home checks. Repeat BP check visit in 1-2 weeks.  Left carotid bruit: Will obtain carotid duplex.   Hyperlipidemia: Management as above  Follow up with me in 6 months with repeat CMP and lipid panel.   Nigel Mormon, MD [facility]. [place] Pager: [phone] Office: [phone] If no answer Cell [phone]

## 2018-05-14 ENCOUNTER — Ambulatory Visit: Payer: Medicare HMO

## 2018-05-14 DIAGNOSIS — R0989 Other specified symptoms and signs involving the circulatory and respiratory systems: Secondary | ICD-10-CM

## 2018-05-19 ENCOUNTER — Telehealth: Payer: Self-pay | Admitting: Cardiology

## 2018-05-19 ENCOUNTER — Other Ambulatory Visit: Payer: Self-pay | Admitting: Cardiology

## 2018-05-19 NOTE — Progress Notes (Signed)
Carotid artery duplex 05/14/2018: Minimal plaque in the right internal carotid artery (minimal). Stenosis in the left internal carotid artery (16-49%). Antegrade right vertebral artery flow.  Follow up in one year is appropriate if clinically indicated. No significant change from 01/17/2017.  In view of her age and Carotid artery duplex  Being stable, will not do Surveillance Dopplers.

## 2018-05-19 NOTE — Telephone Encounter (Signed)
Very mild stenosis left and unchanged from last year. No further f/u study needed. Stable.

## 2018-05-22 NOTE — Telephone Encounter (Signed)
Pt aware of results 

## 2018-05-29 ENCOUNTER — Telehealth: Payer: Self-pay | Admitting: Cardiology

## 2018-05-29 NOTE — Telephone Encounter (Signed)
Carotid artery duplex 05/14/2018: Minimal plaque in the right internal carotid artery (minimal). Stenosis in the left internal carotid artery (16-49%). Antegrade right vertebral artery flow.  Follow up in one year is appropriate if clinically indicated. No significant change from 01/17/2017.  Patient's blood pressure was elevated at last blood pressure check visit.  I was not present in the office at that time.  I called the patient to check about her blood pressure.  Patient tells me that her blood pressure is "fine".  She states that she had been n.p.o. all day before checking the blood pressure at last visit.  She has been checking it at home and it has reportedly stayed around systolic blood pressure of 120 mmHg.  Patient has follow-up with PCP next week.  I encouraged her to check blood pressure at that visit.  While she is on 2 calcium channel blocking agents-amlodipine and diltiazem, she is tolerating it well without any hypotension or bradycardia. No significant leg edema noted at previous visits.  Continue the same.  Next visit with me on 10/31/2018.

## 2018-08-08 ENCOUNTER — Telehealth: Payer: Medicare HMO | Admitting: Family

## 2018-08-08 ENCOUNTER — Emergency Department (HOSPITAL_COMMUNITY)
Admission: EM | Admit: 2018-08-08 | Discharge: 2018-08-08 | Disposition: A | Discharge status: Home / Self Care | Payer: Medicare HMO | Attending: Emergency Medicine | Admitting: Emergency Medicine

## 2018-08-08 ENCOUNTER — Other Ambulatory Visit: Payer: Self-pay | Admitting: Family

## 2018-08-08 ENCOUNTER — Encounter (HOSPITAL_COMMUNITY): Payer: Self-pay | Admitting: Emergency Medicine

## 2018-08-08 ENCOUNTER — Emergency Department (HOSPITAL_COMMUNITY): Payer: Medicare HMO

## 2018-08-08 ENCOUNTER — Other Ambulatory Visit: Payer: Self-pay

## 2018-08-08 DIAGNOSIS — Y939 Activity, unspecified: Secondary | ICD-10-CM | POA: Diagnosis not present

## 2018-08-08 DIAGNOSIS — Z8709 Personal history of other diseases of the respiratory system: Secondary | ICD-10-CM | POA: Diagnosis not present

## 2018-08-08 DIAGNOSIS — Z79899 Other long term (current) drug therapy: Secondary | ICD-10-CM | POA: Insufficient documentation

## 2018-08-08 DIAGNOSIS — N182 Chronic kidney disease, stage 2 (mild): Secondary | ICD-10-CM | POA: Insufficient documentation

## 2018-08-08 DIAGNOSIS — X500XXA Overexertion from strenuous movement or load, initial encounter: Secondary | ICD-10-CM | POA: Insufficient documentation

## 2018-08-08 DIAGNOSIS — Y92013 Bedroom of single-family (private) house as the place of occurrence of the external cause: Secondary | ICD-10-CM | POA: Insufficient documentation

## 2018-08-08 DIAGNOSIS — Z794 Long term (current) use of insulin: Secondary | ICD-10-CM | POA: Diagnosis not present

## 2018-08-08 DIAGNOSIS — E1122 Type 2 diabetes mellitus with diabetic chronic kidney disease: Secondary | ICD-10-CM | POA: Insufficient documentation

## 2018-08-08 DIAGNOSIS — W19XXXA Unspecified fall, initial encounter: Secondary | ICD-10-CM

## 2018-08-08 DIAGNOSIS — S92352A Displaced fracture of fifth metatarsal bone, left foot, initial encounter for closed fracture: Secondary | ICD-10-CM | POA: Diagnosis not present

## 2018-08-08 DIAGNOSIS — Y999 Unspecified external cause status: Secondary | ICD-10-CM | POA: Diagnosis not present

## 2018-08-08 DIAGNOSIS — S99922A Unspecified injury of left foot, initial encounter: Secondary | ICD-10-CM | POA: Diagnosis present

## 2018-08-08 DIAGNOSIS — I129 Hypertensive chronic kidney disease with stage 1 through stage 4 chronic kidney disease, or unspecified chronic kidney disease: Secondary | ICD-10-CM | POA: Diagnosis not present

## 2018-08-08 DIAGNOSIS — Y929 Unspecified place or not applicable: Secondary | ICD-10-CM | POA: Diagnosis not present

## 2018-08-08 DIAGNOSIS — M545 Low back pain, unspecified: Secondary | ICD-10-CM

## 2018-08-08 DIAGNOSIS — I739 Peripheral vascular disease, unspecified: Secondary | ICD-10-CM | POA: Diagnosis not present

## 2018-08-08 NOTE — ED Triage Notes (Signed)
Pt st's she twisted left ankle last pm.  Pt c/o pain in left ankle and left lat foot

## 2018-08-08 NOTE — ED Notes (Signed)
Patient transported to X-ray 

## 2018-08-08 NOTE — Progress Notes (Signed)
Based on what you shared with me, I feel your condition warrants further evaluation and I recommend that you be seen for a face to face office visit.  Given your symptoms started after a fall and based on your age you need to be evaluated face to face to rule out fracture.    NOTE: If you entered your credit card information for this eVisit, you will not be charged. You may see a "hold" on your card for the $35 but that hold will drop off and you will not have a charge processed.  If you are having a true medical emergency please call 911.  If you need an urgent face to face visit, [facility] has four urgent care centers for your convenience.    PLEASE NOTE: THE [facility] LOCATIONS AND URGENT CARE CLINICS DO NOT HAVE THE TESTING FOR CORONAVIRUS COVID19 AVAILABLE.  IF YOU FEEL YOU NEED THIS TEST YOU MUST GO TO A TRIAGE LOCATION AT [facility]   [email or web address] to reserve your spot online an avoid wait times  [facility] [address] [place], [place] [address] Modified hours of operation: Monday-Friday, 12 PM to 6 PM  Saturday & Sunday 10 AM to 4 PM *Across the street from [place] (New Address[facility] [address], [place], [place] [address] *Just off [address], across the road from [place] hours of operation: Monday-Friday, 12 PM to 6 PM  Closed Saturday & Sunday  [facility]'s modified hours of operation will be in effect from May 1 until May 31   The following sites will take your insurance:  . [facility] [place] a Provider at this Location  [address] [place], [place] [address] . 10 am to 8 pm Monday-Friday . 12 pm to 8 pm Saturday-Sunday   . [facility] at [place] a Provider at this Location  [address], [place], [place] [address] . 8 am to 8 pm Monday-Friday . 9 am to 6 pm Saturday . 11 am to 6 pm Sunday   . [facility] at [place] Get Driving Directions  [address] [place], [place] [address] . 8 am to 8 pm Monday-Friday . 8 am to 4 pm Saturday-Sunday   Your e-visit answers were reviewed by a board certified advanced clinical practitioner to complete your personal care plan.  Thank you for using e-Visits.

## 2018-08-08 NOTE — ED Provider Notes (Signed)
[facility] EMERGENCY DEPARTMENT Provider Note   CSN: [id number] Arrival date & time: 08/08/18  1902    History   Chief Complaint Chief Complaint  Patient presents with  . Foot Pain    HPI Kristine Burnett is a 85 y.o. female.     85yo F w/ PMH below who p/w L foot and ankle pain. Last night around 1am, she was getting into bed when she slipped and rolled her L ankle. She thinks she landed on the lateral side of left foot. She denies any fall or other injuries. Today she has had pain w/ ambulating and difficulty ambulating. Daughter has applied ACE wrap and post-op shoe with mild relief.   The history is provided by the patient.  Foot Pain     Past Medical History:  Diagnosis Date  . Abnormal EKG   . Asthma   . Carotid artery occlusion   . DDD (degenerative disc disease)   . Diabetes mellitus without complication ([place])   . Hyperlipidemia   . Hypertension   . PAD (peripheral artery disease) ([place])   . Right lower quadrant abdominal abscess ([place])   . SBO (small bowel obstruction) ([place]) 03/15/2014  . Spinal stenosis   . Type II or unspecified type diabetes mellitus without mention of complication, not stated as uncontrolled 03/22/2013  . Vitamin D deficiency     Patient Active Problem List   Diagnosis Date Noted  . Hepatitis   . Pain   . Biliary obstruction 12/31/2017  . Elevated liver enzymes 12/31/2017  . Acute urinary retention 11/16/2014  . Recurrent appendicitis s/p lap appy 11/15/2014 11/15/2014  . Hypomagnesemia 03/24/2014  . Malnutrition of moderate degree ([place]) 03/19/2014  . Nausea and vomiting   . Hypokalemia   . Diabetes mellitus type 2, controlled ([place]) 02/27/2014  . CKD stage 2 due to type 2 diabetes mellitus ([place]) 12/25/2013  . Toe fracture, right 11/28/2013  . Encounter for long-term (current) use of medications 09/17/2013  . Essential hypertension 03/22/2013  . Hyperlipidemia 03/22/2013  . Vitamin D deficiency 03/22/2013  .  Extrinsic asthma, unspecified 03/22/2013    Past Surgical History:  Procedure Laterality Date  . CHOLECYSTECTOMY    . CYST EXCISION  back  . HERNIA REPAIR    . LAPAROSCOPIC APPENDECTOMY N/A 11/15/2014   Procedure: APPENDECTOMY LAPAROSCOPIC;  Surgeon: Michael Boston, MD;  Location: [facility];  Service: General;  Laterality: N/A;  . MINI-LAPAROTOMY W/ TUBAL LIGATION  1964     OB History   No obstetric history on file.      Home Medications    Prior to Admission medications   Medication Sig Start Date End Date Taking? Authorizing Provider  amLODipine (NORVASC) 10 MG tablet Take 10 mg by mouth daily. 11/08/17   [provider]  aspirin EC 81 MG tablet Take 81 mg by mouth daily.    [provider]  atenolol (TENORMIN) 100 MG tablet TAKE 1 TABLET BY MOUTH DAILY FOR BLOOD PRESSURE 12/02/13   Unk Pinto, MD  Cholecalciferol (VITAMIN D-3) 1000 UNITS CAPS Take 2,000 Units by mouth daily.     [provider]  cilostazol (PLETAL) 50 MG tablet Take 50 mg by mouth daily. 12/29/17   [provider]  Coenzyme Q10 200 MG capsule Take 200 mg by mouth daily.    [provider]  diltiazem (CARDIZEM) 120 MG tablet TAKE 1 TABLET BY MOUTH TWICE DAILY FOR HIGH BLOOD PRESSURE Patient taking differently: Take 120 mg by mouth  daily.  08/03/13   Vicie Mutters, PA-C  Flaxseed, Linseed, (FLAXSEED OIL) 1000 MG CAPS Take 1,000 mg by mouth 4 (four) times daily.    [provider]  Lancets (FREESTYLE) lancets Test glucose 1 time daily 08/16/13   Unk Pinto, MD  Magnesium Oxide (MAG-OX PO) Take 1 tablet by mouth daily.     [provider]  oxybutynin (DITROPAN) 5 MG tablet Take 5 mg by mouth 2 (two) times daily. 12/22/17   [provider]  rosuvastatin (CRESTOR) 20 MG tablet Take 1 tablet (20 mg total) by mouth daily. 05/02/18   Patwardhan, Reynold Bowen, MD  valsartan-hydrochlorothiazide (DIOVAN-HCT) 160-25 MG tablet Take 1 tablet by mouth daily.  11/18/17   [provider]    Family History Family History  Problem Relation Age of Onset  . Heart attack Mother   . Hypertension Mother   . Heart attack Father   . Stroke Sister   . Breast cancer Sister   . Heart attack Brother        x 2  . Colon cancer Sister     Social History Social History   Tobacco Use  . Smoking status: Never Smoker  . Smokeless tobacco: Never Used  Substance Use Topics  . Alcohol use: No    Alcohol/week: 0.0 standard drinks  . Drug use: No     Allergies   Ace inhibitors; Metformin and related; Penicillins; Grapefruit extract; and Minoxidil   Review of Systems Review of Systems  Musculoskeletal: Positive for gait problem and joint swelling.  Skin: Negative for color change and wound.  Neurological: Negative for numbness.     Physical Exam Updated Vital Signs BP (!) 164/94 (BP Location: Right Arm)   Pulse 76   Temp 98.3 F (36.8 C) (Temporal)   Resp 17   Ht 5\' 7"  (1.702 m)   Wt 78.5 kg   SpO2 96%   BMI 27.10 kg/m   Physical Exam Vitals signs and nursing note reviewed.  Constitutional:      General: She is not in acute distress.    Appearance: She is well-developed.  HENT:     Head: Normocephalic and atraumatic.  Cardiovascular:     Pulses: Normal pulses.  Musculoskeletal:        General: Swelling and tenderness present.     Comments: LLE: Achilles tendon intact, no proximal fibular tenderness, + base of 5th metatarsal tenderness, + tenderness lateral malleolus with mild edema; no medial malleolus tenderness  no midfoot instability  Skin:    General: Skin is warm and dry.     Findings: No erythema.  Neurological:     Mental Status: She is alert and oriented to person, place, and time.     Sensory: No sensory deficit.     Comments: Normal sensation b/l lower extremities  Psychiatric:        Judgment: Judgment normal.      ED Treatments / Results  Labs (all labs ordered are listed, but only abnormal  results are displayed) Labs Reviewed - No data to display  EKG None  Radiology Dg Ankle Complete Left  Result Date: 08/08/2018 CLINICAL DATA:  Twisting injury with lateral pain EXAM: LEFT ANKLE COMPLETE - 3+ VIEW COMPARISON:  None. FINDINGS: No acute displaced fracture or malalignment at the ankle. The ankle mortise is symmetric. Large plantar calcaneal spur. Acute nondisplaced fracture base of fifth metatarsal. IMPRESSION: 1. No acute osseous abnormality at the ankle 2. Acute nondisplaced fracture base of fifth metatarsal Electronically  Signed   By: Donavan Foil M.D.   On: 08/08/2018 20:49   Dg Foot Complete Left  Result Date: 08/08/2018 CLINICAL DATA:  Twisting injury with pain EXAM: LEFT FOOT - COMPLETE 3+ VIEW COMPARISON:  None. FINDINGS: Bones appear osteopenic. Large plantar calcaneal spur. Acute nondisplaced fracture base of the fifth metatarsal. IMPRESSION: Acute nondisplaced fracture base of fifth metatarsal Electronically Signed   By: Donavan Foil M.D.   On: 08/08/2018 20:51    Procedures Procedures (including critical care time)  Medications Ordered in ED Medications - No data to display   Initial Impression / Assessment and Plan / ED Course  I have reviewed the triage vital signs and the nursing notes.  Pertinent imaging results that were available during my care of the patient were reviewed by me and considered in my medical decision making (see chart for details).        XR shows small nondisplaced fx base of 5th metatarsal.   Pt already has post-op shoe, ACE wrap, and walker with her, discussed supportive measures.  Final Clinical Impressions(s) / ED Diagnoses   Final diagnoses:  Closed fracture of base of fifth metatarsal bone of left foot    ED Discharge Orders    None       Tavious Griesinger, Wenda Overland, MD 08/09/18 0006

## 2018-08-15 ENCOUNTER — Other Ambulatory Visit: Payer: Self-pay | Admitting: Cardiology

## 2018-08-15 NOTE — Telephone Encounter (Signed)
Please fill

## 2018-08-19 ENCOUNTER — Other Ambulatory Visit: Payer: Self-pay | Admitting: Cardiology

## 2018-08-20 NOTE — Telephone Encounter (Signed)
Please fill

## 2018-09-05 ENCOUNTER — Other Ambulatory Visit: Payer: Self-pay | Admitting: Cardiology

## 2018-09-24 ENCOUNTER — Other Ambulatory Visit: Payer: Self-pay | Admitting: Cardiology

## 2018-10-31 ENCOUNTER — Ambulatory Visit: Payer: Medicare HMO | Admitting: Cardiology

## 2018-12-24 ENCOUNTER — Other Ambulatory Visit: Payer: Self-pay | Admitting: Cardiology

## 2018-12-24 ENCOUNTER — Other Ambulatory Visit: Payer: Self-pay | Admitting: Internal Medicine

## 2018-12-24 NOTE — Telephone Encounter (Signed)
Can I fill this

## 2018-12-31 ENCOUNTER — Other Ambulatory Visit: Payer: Self-pay | Admitting: Internal Medicine

## 2019-02-20 ENCOUNTER — Other Ambulatory Visit: Payer: Self-pay | Admitting: Internal Medicine

## 2019-02-20 DIAGNOSIS — Z1231 Encounter for screening mammogram for malignant neoplasm of breast: Secondary | ICD-10-CM

## 2019-02-22 ENCOUNTER — Other Ambulatory Visit: Payer: Self-pay

## 2019-02-22 ENCOUNTER — Ambulatory Visit
Admission: RE | Admit: 2019-02-22 | Discharge: 2019-02-22 | Disposition: A | Discharge status: Home / Self Care | Payer: Medicare HMO | Source: Ambulatory Visit | Attending: Internal Medicine | Admitting: Internal Medicine

## 2019-02-22 DIAGNOSIS — Z1231 Encounter for screening mammogram for malignant neoplasm of breast: Secondary | ICD-10-CM

## 2019-03-25 ENCOUNTER — Other Ambulatory Visit: Payer: Self-pay | Admitting: Cardiology

## 2019-05-08 ENCOUNTER — Other Ambulatory Visit: Payer: Self-pay | Admitting: Cardiology

## 2019-05-17 ENCOUNTER — Other Ambulatory Visit: Payer: Self-pay | Admitting: Cardiology

## 2019-05-28 ENCOUNTER — Other Ambulatory Visit: Payer: Self-pay | Admitting: Cardiology

## 2019-06-23 ENCOUNTER — Other Ambulatory Visit: Payer: Self-pay | Admitting: Cardiology

## 2019-09-23 ENCOUNTER — Other Ambulatory Visit: Payer: Self-pay | Admitting: Cardiology

## 2019-11-12 ENCOUNTER — Other Ambulatory Visit: Payer: Self-pay | Admitting: Cardiology

## 2019-11-15 ENCOUNTER — Other Ambulatory Visit: Payer: Self-pay | Admitting: Cardiology

## 2019-11-18 ENCOUNTER — Other Ambulatory Visit: Payer: Self-pay | Admitting: Cardiology

## 2019-12-18 ENCOUNTER — Other Ambulatory Visit: Payer: Self-pay | Admitting: Internal Medicine

## 2019-12-18 DIAGNOSIS — G6289 Other specified polyneuropathies: Secondary | ICD-10-CM

## 2019-12-18 DIAGNOSIS — G629 Polyneuropathy, unspecified: Secondary | ICD-10-CM

## 2019-12-24 ENCOUNTER — Other Ambulatory Visit: Payer: Self-pay | Admitting: Cardiology

## 2019-12-30 ENCOUNTER — Ambulatory Visit
Admission: RE | Admit: 2019-12-30 | Discharge: 2019-12-30 | Disposition: A | Discharge status: Home / Self Care | Payer: Medicare HMO | Source: Ambulatory Visit | Attending: Internal Medicine | Admitting: Internal Medicine

## 2019-12-30 DIAGNOSIS — G629 Polyneuropathy, unspecified: Secondary | ICD-10-CM

## 2019-12-30 DIAGNOSIS — G6289 Other specified polyneuropathies: Secondary | ICD-10-CM

## 2020-01-27 ENCOUNTER — Other Ambulatory Visit: Payer: Self-pay | Admitting: Cardiology

## 2020-03-04 ENCOUNTER — Other Ambulatory Visit: Payer: Self-pay | Admitting: Cardiology

## 2020-03-05 ENCOUNTER — Other Ambulatory Visit: Payer: Self-pay | Admitting: Internal Medicine

## 2020-03-05 DIAGNOSIS — Z1231 Encounter for screening mammogram for malignant neoplasm of breast: Secondary | ICD-10-CM

## 2020-04-15 ENCOUNTER — Other Ambulatory Visit: Payer: Self-pay

## 2020-04-15 ENCOUNTER — Ambulatory Visit
Admission: RE | Admit: 2020-04-15 | Discharge: 2020-04-15 | Disposition: A | Discharge status: Home / Self Care | Payer: Medicare HMO | Source: Ambulatory Visit | Attending: Internal Medicine | Admitting: Internal Medicine

## 2020-04-15 DIAGNOSIS — Z1231 Encounter for screening mammogram for malignant neoplasm of breast: Secondary | ICD-10-CM

## 2020-04-16 ENCOUNTER — Other Ambulatory Visit: Payer: Self-pay | Admitting: Internal Medicine

## 2020-04-16 DIAGNOSIS — R928 Other abnormal and inconclusive findings on diagnostic imaging of breast: Secondary | ICD-10-CM

## 2020-05-13 ENCOUNTER — Ambulatory Visit
Admission: RE | Admit: 2020-05-13 | Discharge: 2020-05-13 | Disposition: A | Discharge status: Home / Self Care | Payer: Medicare HMO | Source: Ambulatory Visit | Attending: Internal Medicine | Admitting: Internal Medicine

## 2020-05-13 ENCOUNTER — Other Ambulatory Visit: Payer: Self-pay | Admitting: Internal Medicine

## 2020-05-13 ENCOUNTER — Other Ambulatory Visit: Payer: Self-pay

## 2020-05-13 DIAGNOSIS — R928 Other abnormal and inconclusive findings on diagnostic imaging of breast: Secondary | ICD-10-CM

## 2020-05-18 ENCOUNTER — Encounter: Payer: Self-pay | Admitting: *Deleted

## 2020-05-18 ENCOUNTER — Telehealth: Payer: Self-pay | Admitting: Hematology

## 2020-05-18 DIAGNOSIS — D0512 Intraductal carcinoma in situ of left breast: Secondary | ICD-10-CM

## 2020-05-18 DIAGNOSIS — C50212 Malignant neoplasm of upper-inner quadrant of left female breast: Secondary | ICD-10-CM | POA: Insufficient documentation

## 2020-05-18 NOTE — Telephone Encounter (Signed)
SPOKE TO PATIENT AND DAUGHTER TO CONFIRM AFTERNOON BC APPOINTMENT FOR 3/2, EMAILED PACKET

## 2020-05-20 ENCOUNTER — Encounter: Payer: Self-pay | Admitting: Hematology

## 2020-05-20 ENCOUNTER — Inpatient Hospital Stay: Payer: Medicare HMO | Attending: Hematology | Admitting: Hematology

## 2020-05-20 ENCOUNTER — Encounter: Payer: Self-pay | Admitting: *Deleted

## 2020-05-20 ENCOUNTER — Other Ambulatory Visit: Payer: Self-pay

## 2020-05-20 ENCOUNTER — Ambulatory Visit (HOSPITAL_BASED_OUTPATIENT_CLINIC_OR_DEPARTMENT_OTHER): Payer: Medicare HMO | Admitting: Genetic Counselor

## 2020-05-20 ENCOUNTER — Ambulatory Visit: Payer: Medicare HMO | Admitting: Physical Therapy

## 2020-05-20 ENCOUNTER — Ambulatory Visit
Admission: RE | Admit: 2020-05-20 | Discharge: 2020-05-20 | Disposition: A | Discharge status: Home / Self Care | Payer: Medicare HMO | Source: Ambulatory Visit | Attending: Radiation Oncology | Admitting: Radiation Oncology

## 2020-05-20 ENCOUNTER — Inpatient Hospital Stay: Payer: Medicare HMO

## 2020-05-20 ENCOUNTER — Ambulatory Visit: Payer: Self-pay | Admitting: General Surgery

## 2020-05-20 VITALS — BP 154/54 | HR 75 | Temp 97.3°F | Resp 18 | Ht 67.0 in | Wt 162.4 lb

## 2020-05-20 DIAGNOSIS — Z7982 Long term (current) use of aspirin: Secondary | ICD-10-CM | POA: Diagnosis not present

## 2020-05-20 DIAGNOSIS — Z8042 Family history of malignant neoplasm of prostate: Secondary | ICD-10-CM

## 2020-05-20 DIAGNOSIS — I10 Essential (primary) hypertension: Secondary | ICD-10-CM | POA: Insufficient documentation

## 2020-05-20 DIAGNOSIS — M4800 Spinal stenosis, site unspecified: Secondary | ICD-10-CM | POA: Diagnosis not present

## 2020-05-20 DIAGNOSIS — M8580 Other specified disorders of bone density and structure, unspecified site: Secondary | ICD-10-CM | POA: Insufficient documentation

## 2020-05-20 DIAGNOSIS — Z170 Estrogen receptor positive status [ER+]: Secondary | ICD-10-CM | POA: Diagnosis not present

## 2020-05-20 DIAGNOSIS — D0512 Intraductal carcinoma in situ of left breast: Secondary | ICD-10-CM | POA: Diagnosis not present

## 2020-05-20 DIAGNOSIS — E1151 Type 2 diabetes mellitus with diabetic peripheral angiopathy without gangrene: Secondary | ICD-10-CM | POA: Insufficient documentation

## 2020-05-20 DIAGNOSIS — Z803 Family history of malignant neoplasm of breast: Secondary | ICD-10-CM | POA: Diagnosis not present

## 2020-05-20 DIAGNOSIS — Z800 Family history of malignant neoplasm of digestive organs: Secondary | ICD-10-CM | POA: Insufficient documentation

## 2020-05-20 DIAGNOSIS — E785 Hyperlipidemia, unspecified: Secondary | ICD-10-CM | POA: Insufficient documentation

## 2020-05-20 DIAGNOSIS — Z79899 Other long term (current) drug therapy: Secondary | ICD-10-CM | POA: Insufficient documentation

## 2020-05-20 LAB — CMP (CANCER CENTER ONLY)
ALT: 16 U/L (ref 0–44)
AST: 27 U/L (ref 15–41)
Albumin: 4 g/dL (ref 3.5–5.0)
Alkaline Phosphatase: 66 U/L (ref 38–126)
Anion gap: 8 (ref 5–15)
BUN: 22 mg/dL (ref 8–23)
CO2: 27 mmol/L (ref 22–32)
Calcium: 10 mg/dL (ref 8.9–10.3)
Chloride: 101 mmol/L (ref 98–111)
Creatinine: 0.9 mg/dL (ref 0.44–1.00)
GFR, Estimated: >60 mL/min (ref >60)
Glucose, Bld: 139 mg/dL — ABNORMAL HIGH (ref 70–99)
Potassium: 4.5 mmol/L (ref 3.5–5.1)
Sodium: 136 mmol/L (ref 135–145)
Total Bilirubin: 0.4 mg/dL (ref 0.3–1.2)
Total Protein: 8.6 g/dL — ABNORMAL HIGH (ref 6.5–8.1)

## 2020-05-20 LAB — CBC WITH DIFFERENTIAL (CANCER CENTER ONLY)
Abs Immature Granulocytes: 0 10*3/uL (ref 0.00–0.07)
Basophils Absolute: 0 10*3/uL (ref 0.0–0.1)
Basophils Relative: 1 %
Eosinophils Absolute: 0.1 10*3/uL (ref 0.0–0.5)
Eosinophils Relative: 3 %
HCT: 36.8 % (ref 36.0–46.0)
Hemoglobin: 12.3 g/dL (ref 12.0–15.0)
Immature Granulocytes: 0 %
Lymphocytes Relative: 31 %
Lymphs Abs: 1.2 10*3/uL (ref 0.7–4.0)
MCH: 31.2 pg (ref 26.0–34.0)
MCHC: 33.4 g/dL (ref 30.0–36.0)
MCV: 93.4 fL (ref 80.0–100.0)
Monocytes Absolute: 0.5 10*3/uL (ref 0.1–1.0)
Monocytes Relative: 14 %
Neutro Abs: 1.9 10*3/uL (ref 1.7–7.7)
Neutrophils Relative %: 51 %
Platelet Count: 255 10*3/uL (ref 150–400)
RBC: 3.94 MIL/uL (ref 3.87–5.11)
RDW: 14.5 % (ref 11.5–15.5)
WBC Count: 3.8 10*3/uL — ABNORMAL LOW (ref 4.0–10.5)
nRBC: 0 % (ref 0.0–0.2)

## 2020-05-20 LAB — GENETIC SCREENING ORDER

## 2020-05-20 NOTE — Progress Notes (Signed)
[facility] Psychosocial Distress Screening Counseling Intern  Counseling intern was referred by distress screening protocol.  The patient scored a 1 on the Psychosocial Distress Thermometer which indicates mild distress. Counseling intern met with patient in exam room" to assess for distress and other psychosocial needs. The patient's daughter attended clinic with her and they both described not feeling stressed. The patient and daughter were more focused on what to do next, but the daughter felt more at peace after today. The patient reported a very strong faith and good family support.  ONCBCN DISTRESS SCREENING 05/20/2020  Screening Type Initial Screening  Distress experienced in past week (1-10) 0  Information Concerns Type Lack of info about diagnosis;Lack of info about treatment;Lack of info about complementary therapy choices  Referral to support programs Yes    Follow up needed: No.   Gaylyn Rong Counseling Intern

## 2020-05-20 NOTE — Progress Notes (Signed)
[facility]   [phone] Fax:([phone] [place] Note   Patient Care Team: Willey Blade, MD as PCP - General (Internal Medicine) Sharyne Peach, MD as Consulting Physician (Ophthalmology) Lafayette Dragon, MD (Inactive) as Consulting Physician (Gastroenterology) Armandina Gemma, MD as Consulting Physician (General Surgery) Rockwell Germany, RN as Oncology Nurse Navigator Mauro Kaufmann, RN as Oncology Nurse Navigator Jovita Kussmaul, MD as Consulting Physician (General Surgery) Truitt Merle, MD as Consulting Physician (Hematology) Kyung Rudd, MD as Consulting Physician (Radiation Oncology)  Date of Service:  05/20/2020   CHIEF COMPLAINTS/PURPOSE OF CONSULTATION:  Newly Diagnosed Ductal carcinoma in situ (DCIS) of left breast   Oncology History Overview Note  Cancer Staging Ductal carcinoma in situ (DCIS) of left breast Staging form: Breast, AJCC 8th Edition - Clinical stage from 05/13/2020: Stage 0 (cTis (DCIS), cN0, cM0, G3, ER+, PR+, HER2: Not Assessed) - Signed by Truitt Merle, MD on 05/19/2020 Stage prefix: Initial diagnosis    Ductal carcinoma in situ (DCIS) of left breast  05/13/2020 Cancer Staging   Staging form: Breast, AJCC 8th Edition - Clinical stage from 05/13/2020: Stage 0 (cTis (DCIS), cN0, cM0, G3, ER+, PR+, HER2: Not Assessed) - Signed by Truitt Merle, MD on 05/19/2020 Stage prefix: Initial diagnosis   05/13/2020 Mammogram    IMPRESSION:  1.1 x 1.0 x 0.9 cm mass in the 9:30 o'clock position of the left breast 5 cm from the nipple with imaging features suspicious for malignancy.    05/13/2020 Initial Biopsy   Diagnosis Breast, left, needle core biopsy, 9:30 O'CLOCK left breast AMENDED DIAGNOSIS: - HIGH GRADE DUCTAL CARCINOMA IN SITU WITH CENTRAL NECROSIS AND CALCIFICATIONS. - FOCUS SUSPICIOUS FOR MICROINVASION. Microscopic Comment E-cadherin is positive supporting a ductal phenotype. In performing E-cadherin, subsequent  leveling revealed a focus suspicious for microinvasion. Ancillary studies will be reported separately. Revised results reported to [facility] on 05/15/2020. Dr. Tresa Moore reviewed.   05/13/2020 Receptors her2   Results: IMMUNOHISTOCHEMICAL AND MORPHOMETRIC ANALYSIS PERFORMED MANUALLY Estrogen Receptor: 95%, POSITIVE, STRONG STAINING INTENSITY Progesterone Receptor: 95%, POSITIVE, STRONG STAINING INTENSITY   05/18/2020 Initial Diagnosis   Ductal carcinoma in situ (DCIS) of left breast      HISTORY OF PRESENTING ILLNESS:  Kristine Burnett 87 y.o. female is a here because of newly diagnosed left breast cancer. The patient presents to the clinic today accompanied by her daughter.  She did not feel her mass herself. Her mass was found by screening mammogram. This was her first abnormal mammogram.   Socially she is widowed. She lives with 1 of her daughters. She has 3 adult daughters. She can do some house chores at home independently.  She has a PMHx of spinal stenosis. This does cause pain sometimes. She ambulates with walker although time. She also has borderline DM, HTN. I reviewed her medication list with her. She had 3 sisters with breast cancer and 1 sister had colon cancer.     GYN HISTORY  Menarchal: 14 LMP: NA Contraceptive: No HRT: No  G3P: first ar age 53, no breast feeding     REVIEW OF SYSTEMS:    Constitutional: Denies fevers, chills or abnormal night sweats Eyes: Denies blurriness of vision, double vision or watery eyes Ears, nose, mouth, throat, and face: Denies mucositis or sore throat Respiratory: Denies cough, dyspnea or wheezes Cardiovascular: Denies palpitation, chest discomfort or lower extremity swelling Gastrointestinal:  Denies nausea, heartburn or change in bowel habits Skin: Denies abnormal skin  rashes MSK: (+) Spinal stenosis with occasional back pain (+) Right leg pain Lymphatics: Denies new lymphadenopathy or easy  bruising Neurological:Denies numbness, tingling or new weaknesses Behavioral/Psych: Mood is stable, no new changes  All other systems were reviewed with the patient and are negative.  MEDICAL HISTORY:  Past Medical History:  Diagnosis Date  . Abnormal EKG   . Asthma   . Carotid artery occlusion   . DDD (degenerative disc disease)   . Diabetes mellitus without complication ([place])   . Hyperlipidemia   . Hypertension   . PAD (peripheral artery disease) ([place])   . Right lower quadrant abdominal abscess ([place])   . SBO (small bowel obstruction) ([place]) 03/15/2014  . Spinal stenosis   . Type II or unspecified type diabetes mellitus without mention of complication, not stated as uncontrolled 03/22/2013  . Vitamin D deficiency     SURGICAL HISTORY: Past Surgical History:  Procedure Laterality Date  . CHOLECYSTECTOMY    . CYST EXCISION  back  . HERNIA REPAIR    . LAPAROSCOPIC APPENDECTOMY N/A 11/15/2014   Procedure: APPENDECTOMY LAPAROSCOPIC;  Surgeon: Michael Boston, MD;  Location: [facility];  Service: General;  Laterality: N/A;  . MINI-LAPAROTOMY W/ TUBAL LIGATION  1964    SOCIAL HISTORY: Social History   Socioeconomic History  . Marital status: Widowed    Spouse name: Not on file  . Number of children: 3  . Years of education: Not on file  . Highest education level: Not on file  Occupational History  . Not on file  Tobacco Use  . Smoking status: Never Smoker  . Smokeless tobacco: Never Used  Vaping Use  . Vaping Use: Never used  Substance and Sexual Activity  . Alcohol use: No    Alcohol/week: 0.0 standard drinks  . Drug use: No  . Sexual activity: Not on file  Other Topics Concern  . Not on file  Social History Narrative  . Not on file   Social Determinants of Health   Financial Resource Strain: Not on file  Food Insecurity: Not on file  Transportation Needs: Not on file  Physical Activity: Not on file  Stress: Not on file  Social Connections: Not on file  Intimate  Partner Violence: Not on file    FAMILY HISTORY: Family History  Problem Relation Age of Onset  . Heart attack Mother   . Hypertension Mother   . Heart attack Father   . Stroke Sister   . Breast cancer Sister   . Heart attack Brother        x 2  . Breast cancer Sister   . Colon cancer Sister   . Breast cancer Sister     ALLERGIES:  is allergic to ace inhibitors, metformin and related, penicillins, grapefruit extract, and minoxidil.  MEDICATIONS:  Current Outpatient Medications  Medication Sig Dispense Refill  . amLODipine (NORVASC) 10 MG tablet Take 10 mg by mouth daily.    Marland Kitchen aspirin EC 81 MG tablet Take 81 mg by mouth daily.    Marland Kitchen atenolol (TENORMIN) 100 MG tablet TAKE ONE TABLET BY MOUTH DAILY *PLEASE SCHEDULE F/U APPOINTMENT TO OBTAIN FURTHER REFILLS* 30 tablet 0  . Cholecalciferol (VITAMIN D-3) 1000 UNITS CAPS Take 2,000 Units by mouth daily.     . cilostazol (PLETAL) 50 MG tablet TAKE ONE TABLET BY MOUTH TWICE A DAY 180 tablet 0  . Coenzyme Q10 200 MG capsule Take 200 mg by mouth daily.    . Cyanocobalamin (VITAMIN B 12 PO)  Take 500 mcg by mouth daily.    Marland Kitchen diltiazem (CARDIZEM) 120 MG tablet TAKE ONE TABLET BY MOUTH DAILY 90 tablet 1  . Flaxseed, Linseed, (FLAXSEED OIL) 1000 MG CAPS Take 1,000 mg by mouth 4 (four) times daily.    . Magnesium Oxide (MAG-OX PO) Take 1 tablet by mouth daily.     . Multiple Vitamin (MULTIVITAMIN ADULT PO) Take 1 tablet by mouth daily at 6 (six) AM.    . oxybutynin (DITROPAN) 5 MG tablet Take 5 mg by mouth 2 (two) times daily.    . rosuvastatin (CRESTOR) 20 MG tablet Take 1 tablet (20 mg total) by mouth daily. 90 tablet 3  . valsartan-hydrochlorothiazide (DIOVAN-HCT) 160-25 MG tablet TAKE ONE TABLET BY MOUTH DAILY 90 tablet 1  . vitamin C (ASCORBIC ACID) 250 MG tablet Take 250 mg by mouth daily.    . Lancets (FREESTYLE) lancets Test glucose 1 time daily 100 each 1   No current facility-administered medications for this visit.    PHYSICAL  EXAMINATION: ECOG PERFORMANCE STATUS: 2 - Symptomatic, <50% confined to bed  Vitals:   05/20/20 1304  BP: (!) 154/54  Pulse: 75  Resp: 18  Temp: (!) 97.3 F (36.3 C)  SpO2: 100%   Filed Weights   05/20/20 1304  Weight: 162 lb 6.4 oz (73.7 kg)    GENERAL:alert, no distress and comfortable SKIN: skin color, texture, turgor are normal, no rashes or significant lesions EYES: normal, Conjunctiva are pink and non-injected, sclera clear  NECK: supple, thyroid normal size, non-tender, without nodularity LYMPH:  no palpable lymphadenopathy in the cervical, axillary  LUNGS: clear to auscultation and percussion with normal breathing effort HEART: regular rate & rhythm and no murmurs and no lower extremity edema ABDOMEN:abdomen soft, non-tender and normal bowel sounds Musculoskeletal:no cyanosis of digits and no clubbing  NEURO: alert & oriented x 3 with fluent speech, no focal motor/sensory deficits BREAST: (+) Palpable 3x2 cm in left breast, likely bleeding from biopsy. Right Breast exam benign.  LABORATORY DATA:  I have reviewed the data as listed CBC Latest Ref Rng & Units 05/20/2020 01/01/2018 12/31/2017  WBC 4.0 - 10.5 K/uL 3.8(L) 13.6(H) -  Hemoglobin 12.0 - 15.0 g/dL 12.3 10.7(L) 12.6  Hematocrit 36.0 - 46.0 % 36.8 32.9(L) 37.0  Platelets 150 - 400 K/uL 255 173 -    CMP Latest Ref Rng & Units 05/20/2020 01/01/2018 12/31/2017  Glucose 70 - 99 mg/dL 139(H) 88 190(H)  BUN 8 - 23 mg/dL 22 18 30(H)  Creatinine 0.44 - 1.00 mg/dL 0.90 0.78 1.00  Sodium 135 - 145 mmol/L 136 137 139  Potassium 3.5 - 5.1 mmol/L 4.5 3.7 3.0(L)  Chloride 98 - 111 mmol/L 101 103 99  CO2 22 - 32 mmol/L 27 23 -  Calcium 8.9 - 10.3 mg/dL 10.0 9.2 -  Total Protein 6.5 - 8.1 g/dL 8.6(H) 6.8 -  Total Bilirubin 0.3 - 1.2 mg/dL 0.4 2.8(H) -  Alkaline Phos 38 - 126 U/L 66 61 -  AST 15 - 41 U/L 27 211(H) -  ALT 0 - 44 U/L 16 164(H) -     RADIOGRAPHIC STUDIES: I have personally reviewed the radiological  images as listed and agreed with the findings in the report. US BREAST LTD UNI LEFT INC AXILLA  Result Date: 05/13/2020 CLINICAL DATA:  Possible mass with calcifications in the medial left breast on a recent screening mammogram. EXAM: DIGITAL DIAGNOSTIC UNILATERAL LEFT MAMMOGRAM WITH TOMOSYNTHESIS AND CAD; ULTRASOUND LEFT BREAST LIMITED TECHNIQUE: Left digital  diagnostic mammography and breast tomosynthesis was performed. The images were evaluated with computer-aided detection.; Targeted ultrasound examination of the left breast was performed COMPARISON:  Previous exam(s). ACR Breast Density Category c: The breast tissue is heterogeneously dense, which may obscure small masses. FINDINGS: 3D tomographic and 2D generated spot compression and true lateral images of the left breast were obtained as well as 2D spot magnification images. These confirm a mildly irregular, oval, mass-like density in the medial aspect of the breast in approximately the 9 position. There are associated dense, oval, circumscribed calcifications. On physical exam, the patient has an approximately 1.2 cm rounded, superficial, firm palpable mass in the 9:30 o'clock position of the left breast, 5 cm from the nipple. There are no palpable left axillary lymph nodes. Targeted ultrasound is performed, showing a 1.1 x 1.0 x 0.9 cm vertically oriented, irregular, heterogeneous, predominantly hypoechoic mass in the 9:30 o'clock position of the left breast, 5 cm from the nipple. This exhibits mild posterior acoustical shadowing and corresponds to the mammographic mass. This extends just beneath the skin. Ultrasound of the left axilla demonstrated no axillary adenopathy. IMPRESSION: 1.1 cm mass in the 9:30 o'clock position of the left breast with imaging features suspicious for malignancy. RECOMMENDATION: Ultrasound-guided core needle biopsy of the 1.1 cm mass in the 9:30 o'clock position of the left breast. This has been discussed with the patient and  her daughter and scheduled at 1:45 p.m. today. I have discussed the findings and recommendations with the patient. If applicable, a reminder letter will be sent to the patient regarding the next appointment. BI-RADS CATEGORY  4: Suspicious. Electronically Signed   By: Claudie Revering M.D.   On: 05/13/2020 09:46   MM DIAG BREAST TOMO UNI LEFT  Result Date: 05/13/2020 CLINICAL DATA:  Possible mass with calcifications in the medial left breast on a recent screening mammogram. EXAM: DIGITAL DIAGNOSTIC UNILATERAL LEFT MAMMOGRAM WITH TOMOSYNTHESIS AND CAD; ULTRASOUND LEFT BREAST LIMITED TECHNIQUE: Left digital diagnostic mammography and breast tomosynthesis was performed. The images were evaluated with computer-aided detection.; Targeted ultrasound examination of the left breast was performed COMPARISON:  Previous exam(s). ACR Breast Density Category c: The breast tissue is heterogeneously dense, which may obscure small masses. FINDINGS: 3D tomographic and 2D generated spot compression and true lateral images of the left breast were obtained as well as 2D spot magnification images. These confirm a mildly irregular, oval, mass-like density in the medial aspect of the breast in approximately the 9 position. There are associated dense, oval, circumscribed calcifications. On physical exam, the patient has an approximately 1.2 cm rounded, superficial, firm palpable mass in the 9:30 o'clock position of the left breast, 5 cm from the nipple. There are no palpable left axillary lymph nodes. Targeted ultrasound is performed, showing a 1.1 x 1.0 x 0.9 cm vertically oriented, irregular, heterogeneous, predominantly hypoechoic mass in the 9:30 o'clock position of the left breast, 5 cm from the nipple. This exhibits mild posterior acoustical shadowing and corresponds to the mammographic mass. This extends just beneath the skin. Ultrasound of the left axilla demonstrated no axillary adenopathy. IMPRESSION: 1.1 cm mass in the 9:30  o'clock position of the left breast with imaging features suspicious for malignancy. RECOMMENDATION: Ultrasound-guided core needle biopsy of the 1.1 cm mass in the 9:30 o'clock position of the left breast. This has been discussed with the patient and her daughter and scheduled at 1:45 p.m. today. I have discussed the findings and recommendations with the patient. If applicable, a reminder letter will  be sent to the patient regarding the next appointment. BI-RADS CATEGORY  4: Suspicious. Electronically Signed   By: Claudie Revering M.D.   On: 05/13/2020 09:46   MM CLIP PLACEMENT LEFT  Result Date: 05/13/2020 CLINICAL DATA:  Status post ultrasound-guided core needle biopsy of a 1.1 cm mass in the 9:30 o'clock position of the left breast. EXAM: DIAGNOSTIC LEFT MAMMOGRAM POST ULTRASOUND BIOPSY COMPARISON:  Previous exam(s). FINDINGS: Mammographic images were obtained following ultrasound guided biopsy of the recently demonstrated 1.1 cm mass in the 9:30 o'clock position of the left breast. The biopsy marking clip is in expected position at the site of biopsy. This is in the anterior aspect of the biopsied mass. IMPRESSION: Appropriate positioning of the ribbon shaped biopsy marking clip at the site of biopsy in the 9:30 o'clock position of the left breast. Final Assessment: Post Procedure Mammograms for Marker Placement Electronically Signed   By: Claudie Revering M.D.   On: 05/13/2020 15:30   [place] LT BREAST BX W LOC DEV 1ST LESION IMG BX SPEC US GUIDE  Addendum Date: 05/15/2020   ADDENDUM REPORT: 05/15/2020 14:45 ADDENDUM: Pathology revealed HIGH GRADE DUCTAL CARCINOMA IN SITU WITH CENTRAL NECROSIS AND CALCIFICATIONS, FOCUS SUSPICIOUS FOR MICROINVASION of the Left breast, 9:30 o'clock. This was found to be concordant by Dr. Claudie Revering. Pathology results were discussed with the patient and her daughter, Collene Mares, by telephone. The patient reported doing well after the biopsy with tenderness at the site. Post biopsy  instructions and care were reviewed and questions were answered. The patient was encouraged to call [facility] for any additional concerns. My direct phone number was provided. The patient was referred to [facility] on May 20, 2020. Consideration for a bilateral breast MRI for further evaluation of extent of disease given the high grade histology. Pathology results reported by Terie Purser, RN on 05/15/2020. Electronically Signed   By: Claudie Revering M.D.   On: 05/15/2020 14:45   Result Date: 05/15/2020 CLINICAL DATA:  1.1 cm mass in the 9:30 o'clock position of the left breast at recent mammography and ultrasound. This has imaging features suspicious for malignancy. EXAM: ULTRASOUND GUIDED LEFT BREAST CORE NEEDLE BIOPSY COMPARISON:  Previous examinations. PROCEDURE: I met with the patient and we discussed the procedure of ultrasound-guided biopsy, including benefits and alternatives. We discussed the high likelihood of a successful procedure. We discussed the risks of the procedure, including infection, bleeding, tissue injury, clip migration, and inadequate sampling. Informed written consent was given. The usual time-out protocol was performed immediately prior to the procedure. Lesion quadrant: Upper inner quadrant Using sterile technique and 1% Lidocaine as local anesthetic, under direct ultrasound visualization, a 12 gauge spring-loaded device was used to perform biopsy of the recently demonstrated 1.1 cm mass in the 9:30 o'clock position of the left breast using a caudal approach. At the conclusion of the procedure a ribbon shaped tissue marker clip was deployed into the biopsy cavity. Follow up 2 view mammogram was performed and dictated separately. IMPRESSION: Ultrasound guided biopsy of the recently demonstrated 1.1 cm mass in the 9:30 o'clock position of the left breast. No apparent complications.  Electronically Signed: By: Claudie Revering M.D. On: 05/13/2020 14:33    ASSESSMENT & PLAN:  Kristine Burnett is a 87 y.o. African American female with a history of Asthma, DDD, PAD, HTN., HLD, DM, Spinal stenosis   1. Left breast DCIS, High grade,  ER+/PR+ -We discussed her image findings and the biopsy results in great details. She has a 1.1c mass in her left breast, high grade, with possible microinvasion, ER/PR positive. There is suspicion for microinvasion.  -She is a candidate for breast conservation surgery. She has been seen by breast surgeon Dr. Marlou Starks, who recommends lumpectomy. -Given her strong family history of breast cancer by 3 sister, we recommend her to undergo genetic testing to ruled out inheritable breast cancer. She is agreeable.  -Her DCIS will be cured by complete surgical resection. Any form of adjuvant therapy is preventive. -Given her strongly positive ER and PR, I do recommend antiestrogen therapy with Tamoxifen given osteopenia, which decrease her risk of future breast cancer by ~50%.  ---The potential side effects, which includes but not limited to, hot flash, skin and vaginal dryness, slightly increased risk of cardiovascular disease and cataract, small risk of thrombosis and endometrial cancer, were discussed with her in great details. Preventive strategies for thrombosis, such as being physically active, using compression stocks, avoid cigarette smoking, etc., were reviewed with her. I also recommend her to follow-up with her gynecologist, and watch for vaginal spotting or bleeding, as a clinically sign of endometrial cancer, etc. She voiced good understanding, and agrees to proceed. Will start after surgery -Given the purpose of treatment is preventive, her advanced age, if she does experience significant side effects, I have low threshold to stop tamoxifen. --She saw Dr Lisbeth Renshaw today. Given her age, she may forego Radiation. Will re-evaluate after surgery.  -We also discussed  that biopsy may have sampling limitation, we will review her surgical path, to see if she has any invasive carcinoma components. -We discussed breast cancer surveillance after she completes treatment, Including annual mammogram, breast exam every 6-12 months. -Labs reviewed, WBC 3.8, BG 139, Protein 8.6. She has 3cm mass of her left breast from her biopsy.  -She will proceed with surgery soon. I will f/u with her 1-2 months.   2. Osteopenia  -Her 12/2017 DEXA showed osteopenia with lowest T-score -2 at [facility].  -She will proceed with Tamoxifen which can help strengthen her bones.    3. Comorbidities: Spinal stenosis, PAD, HTN, HLD, borderline DM -Her spinal stenosis requires she ambulate with walker. She does have occasional back pain. She has some right hip pain as well.  -She is not on medication for borderline DM. She is on medication for HTN and HLD. I encouraged her to continue.    PLAN:  -Proceed with surgery soon  - F/u in 1-2 months, plan to start tamoxifen on next visit    No orders of the defined types were placed in this encounter.   All questions were answered. The patient knows to call the clinic with any problems, questions or concerns. The total time spent in the appointment was 50 minutes.     Truitt Merle, MD 05/20/2020 5:02 PM  I, Joslyn Devon, am acting as scribe for Truitt Merle, MD.   I have reviewed the above documentation for accuracy and completeness, and I agree with the above.

## 2020-05-20 NOTE — Progress Notes (Signed)
Radiation Oncology         [phone] ________________________________  Name: Kristine Burnett        MRN: [id number]  Date of Service: 05/20/2020 DOB: January 23, 1930  NL:[id number], Kristine Millin, MD  Willey Blade, MD     REFERRING PHYSICIAN: Willey Blade, MD   DIAGNOSIS: There were no encounter diagnoses.   HISTORY OF PRESENT ILLNESS: Kristine Burnett is a 87 y.o. female seen in the multidisciplinary breast clinic for a new diagnosis of left breast cancer. The patient was noted to have screening detected calcifications in the left breast. She was found ot have a 1.1 cm group of calcifications that was described as mass like and at 9:30 position and her axilla was negative for adenopathy. 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 She's seen today to discuss treatment options of her cancer.    PREVIOUS RADIATION THERAPY: No   PAST MEDICAL HISTORY:  Past Medical History:  Diagnosis Date  . Abnormal EKG   . Asthma   . Carotid artery occlusion   . DDD (degenerative disc disease)   . Diabetes mellitus without complication ([place])   . Hyperlipidemia   . Hypertension   . PAD (peripheral artery disease) ([place])   . Right lower quadrant abdominal abscess ([place])   . SBO (small bowel obstruction) ([place]) 03/15/2014  . Spinal stenosis   . Type II or unspecified type diabetes mellitus without mention of complication, not stated as uncontrolled 03/22/2013  . Vitamin D deficiency        PAST SURGICAL HISTORY: Past Surgical History:  Procedure Laterality Date  . CHOLECYSTECTOMY    . CYST EXCISION  back  . HERNIA REPAIR    . LAPAROSCOPIC APPENDECTOMY N/A 11/15/2014   Procedure: APPENDECTOMY LAPAROSCOPIC;  Surgeon: Michael Boston, MD;  Location: [facility];  Service: General;  Laterality: N/A;  . MINI-LAPAROTOMY W/ TUBAL LIGATION  1964     FAMILY HISTORY:  Family History  Problem Relation Age  of Onset  . Heart attack Mother   . Hypertension Mother   . Heart attack Father   . Stroke Sister   . Breast cancer Sister   . Heart attack Brother        x 2  . Colon cancer Sister      SOCIAL HISTORY:  reports that she has never smoked. She has never used smokeless tobacco. She reports that she does not drink alcohol and does not use drugs.   ALLERGIES: Ace inhibitors, Metformin and related, Penicillins, Grapefruit extract, and Minoxidil   MEDICATIONS:  Current Outpatient Medications  Medication Sig Dispense Refill  . amLODipine (NORVASC) 10 MG tablet Take 10 mg by mouth daily.    Marland Kitchen aspirin EC 81 MG tablet Take 81 mg by mouth daily.    Marland Kitchen atenolol (TENORMIN) 100 MG tablet TAKE ONE TABLET BY MOUTH DAILY *PLEASE SCHEDULE F/U APPOINTMENT TO OBTAIN FURTHER REFILLS* 30 tablet 0  . Cholecalciferol (VITAMIN D-3) 1000 UNITS CAPS Take 2,000 Units by mouth daily.     . cilostazol (PLETAL) 50 MG tablet TAKE ONE TABLET BY MOUTH TWICE A DAY 180 tablet 0  . Coenzyme Q10 200 MG capsule Take 200 mg by mouth daily.    Marland Kitchen diltiazem (CARDIZEM) 120 MG tablet TAKE ONE TABLET BY MOUTH DAILY 90 tablet 1  . Flaxseed, Linseed, (FLAXSEED OIL) 1000 MG CAPS Take 1,000 mg by mouth 4 (four) times daily.    Marland Kitchen  Lancets (FREESTYLE) lancets Test glucose 1 time daily 100 each 1  . Magnesium Oxide (MAG-OX PO) Take 1 tablet by mouth daily.     Marland Kitchen oxybutynin (DITROPAN) 5 MG tablet Take 5 mg by mouth 2 (two) times daily.    . rosuvastatin (CRESTOR) 20 MG tablet Take 1 tablet (20 mg total) by mouth daily. 90 tablet 3  . valsartan-hydrochlorothiazide (DIOVAN-HCT) 160-25 MG tablet TAKE ONE TABLET BY MOUTH DAILY 90 tablet 1   No current facility-administered medications for this encounter.     REVIEW OF SYSTEMS: On review of systems, the patient reports that she is doing well overall. She denies any chest pain, shortness of breath, cough, fevers, chills, night sweats, unintended weight changes. She denies any bowel or  bladder disturbances, and denies abdominal pain, nausea or vomiting. She denies any new musculoskeletal or joint aches or pains. A complete review of systems is obtained and is otherwise negative.     PHYSICAL EXAM:  Wt Readings from Last 3 Encounters:  05/20/20 162 lb 6.4 oz (73.7 kg)  08/08/18 173 lb (78.5 kg)  05/02/18 173 lb 1.6 oz (78.5 kg)   Temp Readings from Last 3 Encounters:  05/20/20 (!) 97.3 F (36.3 C) (Tympanic)  08/08/18 98.3 F (36.8 C) (Temporal)  01/01/18 99 F (37.2 C) (Oral)   BP Readings from Last 3 Encounters:  05/20/20 (!) 154/54  08/08/18 (!) 164/94  05/02/18 (!) 156/77   Pulse Readings from Last 3 Encounters:  05/20/20 75  08/08/18 76  05/02/18 78    In general this is a well appearing African American female in no acute distress. She's alert and oriented x4 and appropriate throughout the examination. Cardiopulmonary assessment is negative for acute distress and she exhibits normal effort. Bilateral breast exam is deferred.    ECOG = 0  0 - Asymptomatic (Fully active, able to carry on all predisease activities without restriction)  1 - Symptomatic but completely ambulatory (Restricted in physically strenuous activity but ambulatory and able to carry out work of a light or sedentary nature. For example, light housework, office work)  2 - Symptomatic, <50% in bed during the day (Ambulatory and capable of all self care but unable to carry out any work activities. Up and about more than 50% of waking hours)  3 - Symptomatic, >50% in bed, but not bedbound (Capable of only limited self-care, confined to bed or chair 50% or more of waking hours)  4 - Bedbound (Completely disabled. Cannot carry on any self-care. Totally confined to bed or chair)  5 - Death   Eustace Pen MM, Creech RH, Tormey DC, et al. [phone]). "Toxicity and response criteria of the [facility]". [place] Oncol. 5 (6): 649-55    LABORATORY DATA:  Lab Results   Component Value Date   WBC 3.8 (L) 05/20/2020   HGB 12.3 05/20/2020   HCT 36.8 05/20/2020   MCV 93.4 05/20/2020   PLT 255 05/20/2020   Lab Results  Component Value Date   NA 136 05/20/2020   K 4.5 05/20/2020   CL 101 05/20/2020   CO2 27 05/20/2020   Lab Results  Component Value Date   ALT 16 05/20/2020   AST 27 05/20/2020   ALKPHOS 66 05/20/2020   BILITOT 0.4 05/20/2020      RADIOGRAPHY: US BREAST LTD UNI LEFT INC AXILLA  Result Date: 05/13/2020 CLINICAL DATA:  Possible mass with calcifications in the medial left breast on a recent screening mammogram. EXAM: DIGITAL DIAGNOSTIC  UNILATERAL LEFT MAMMOGRAM WITH TOMOSYNTHESIS AND CAD; ULTRASOUND LEFT BREAST LIMITED TECHNIQUE: Left digital diagnostic mammography and breast tomosynthesis was performed. The images were evaluated with computer-aided detection.; Targeted ultrasound examination of the left breast was performed COMPARISON:  Previous exam(s). ACR Breast Density Category c: The breast tissue is heterogeneously dense, which may obscure small masses. FINDINGS: 3D tomographic and 2D generated spot compression and true lateral images of the left breast were obtained as well as 2D spot magnification images. These confirm a mildly irregular, oval, mass-like density in the medial aspect of the breast in approximately the 9 position. There are associated dense, oval, circumscribed calcifications. On physical exam, the patient has an approximately 1.2 cm rounded, superficial, firm palpable mass in the 9:30 o'clock position of the left breast, 5 cm from the nipple. There are no palpable left axillary lymph nodes. Targeted ultrasound is performed, showing a 1.1 x 1.0 x 0.9 cm vertically oriented, irregular, heterogeneous, predominantly hypoechoic mass in the 9:30 o'clock position of the left breast, 5 cm from the nipple. This exhibits mild posterior acoustical shadowing and corresponds to the mammographic mass. This extends just beneath the skin.  Ultrasound of the left axilla demonstrated no axillary adenopathy. IMPRESSION: 1.1 cm mass in the 9:30 o'clock position of the left breast with imaging features suspicious for malignancy. RECOMMENDATION: Ultrasound-guided core needle biopsy of the 1.1 cm mass in the 9:30 o'clock position of the left breast. This has been discussed with the patient and her daughter and scheduled at 1:45 p.m. today. I have discussed the findings and recommendations with the patient. If applicable, a reminder letter will be sent to the patient regarding the next appointment. BI-RADS CATEGORY  4: Suspicious. Electronically Signed   By: Claudie Revering M.D.   On: 05/13/2020 09:46   MM DIAG BREAST TOMO UNI LEFT  Result Date: 05/13/2020 CLINICAL DATA:  Possible mass with calcifications in the medial left breast on a recent screening mammogram. EXAM: DIGITAL DIAGNOSTIC UNILATERAL LEFT MAMMOGRAM WITH TOMOSYNTHESIS AND CAD; ULTRASOUND LEFT BREAST LIMITED TECHNIQUE: Left digital diagnostic mammography and breast tomosynthesis was performed. The images were evaluated with computer-aided detection.; Targeted ultrasound examination of the left breast was performed COMPARISON:  Previous exam(s). ACR Breast Density Category c: The breast tissue is heterogeneously dense, which may obscure small masses. FINDINGS: 3D tomographic and 2D generated spot compression and true lateral images of the left breast were obtained as well as 2D spot magnification images. These confirm a mildly irregular, oval, mass-like density in the medial aspect of the breast in approximately the 9 position. There are associated dense, oval, circumscribed calcifications. On physical exam, the patient has an approximately 1.2 cm rounded, superficial, firm palpable mass in the 9:30 o'clock position of the left breast, 5 cm from the nipple. There are no palpable left axillary lymph nodes. Targeted ultrasound is performed, showing a 1.1 x 1.0 x 0.9 cm vertically oriented,  irregular, heterogeneous, predominantly hypoechoic mass in the 9:30 o'clock position of the left breast, 5 cm from the nipple. This exhibits mild posterior acoustical shadowing and corresponds to the mammographic mass. This extends just beneath the skin. Ultrasound of the left axilla demonstrated no axillary adenopathy. IMPRESSION: 1.1 cm mass in the 9:30 o'clock position of the left breast with imaging features suspicious for malignancy. RECOMMENDATION: Ultrasound-guided core needle biopsy of the 1.1 cm mass in the 9:30 o'clock position of the left breast. This has been discussed with the patient and her daughter and scheduled at 1:45 p.m. today. I have  discussed the findings and recommendations with the patient. If applicable, a reminder letter will be sent to the patient regarding the next appointment. BI-RADS CATEGORY  4: Suspicious. Electronically Signed   By: Claudie Revering M.D.   On: 05/13/2020 09:46   MM CLIP PLACEMENT LEFT  Result Date: 05/13/2020 CLINICAL DATA:  Status post ultrasound-guided core needle biopsy of a 1.1 cm mass in the 9:30 o'clock position of the left breast. EXAM: DIAGNOSTIC LEFT MAMMOGRAM POST ULTRASOUND BIOPSY COMPARISON:  Previous exam(s). FINDINGS: Mammographic images were obtained following ultrasound guided biopsy of the recently demonstrated 1.1 cm mass in the 9:30 o'clock position of the left breast. The biopsy marking clip is in expected position at the site of biopsy. This is in the anterior aspect of the biopsied mass. IMPRESSION: Appropriate positioning of the ribbon shaped biopsy marking clip at the site of biopsy in the 9:30 o'clock position of the left breast. Final Assessment: Post Procedure Mammograms for Marker Placement Electronically Signed   By: Claudie Revering M.D.   On: 05/13/2020 15:30   [place] LT BREAST BX W LOC DEV 1ST LESION IMG BX SPEC US GUIDE  Addendum Date: 05/15/2020   ADDENDUM REPORT: 05/15/2020 14:45 ADDENDUM: Pathology revealed HIGH GRADE DUCTAL CARCINOMA  IN SITU WITH CENTRAL NECROSIS AND CALCIFICATIONS, FOCUS SUSPICIOUS FOR MICROINVASION of the Left breast, 9:30 o'clock. This was found to be concordant by Dr. Claudie Revering. Pathology results were discussed with the patient and her daughter, Collene Mares, by telephone. The patient reported doing well after the biopsy with tenderness at the site. Post biopsy instructions and care were reviewed and questions were answered. The patient was encouraged to call [facility] for any additional concerns. My direct phone number was provided. The patient was referred to [facility] on May 20, 2020. Consideration for a bilateral breast MRI for further evaluation of extent of disease given the high grade histology. Pathology results reported by Terie Purser, RN on 05/15/2020. Electronically Signed   By: Claudie Revering M.D.   On: 05/15/2020 14:45   Result Date: 05/15/2020 CLINICAL DATA:  1.1 cm mass in the 9:30 o'clock position of the left breast at recent mammography and ultrasound. This has imaging features suspicious for malignancy. EXAM: ULTRASOUND GUIDED LEFT BREAST CORE NEEDLE BIOPSY COMPARISON:  Previous examinations. PROCEDURE: I met with the patient and we discussed the procedure of ultrasound-guided biopsy, including benefits and alternatives. We discussed the high likelihood of a successful procedure. We discussed the risks of the procedure, including infection, bleeding, tissue injury, clip migration, and inadequate sampling. Informed written consent was given. The usual time-out protocol was performed immediately prior to the procedure. Lesion quadrant: Upper inner quadrant Using sterile technique and 1% Lidocaine as local anesthetic, under direct ultrasound visualization, a 12 gauge spring-loaded device was used to perform biopsy of the recently demonstrated 1.1 cm mass in the 9:30 o'clock position of the left  breast using a caudal approach. At the conclusion of the procedure a ribbon shaped tissue marker clip was deployed into the biopsy cavity. Follow up 2 view mammogram was performed and dictated separately. IMPRESSION: Ultrasound guided biopsy of the recently demonstrated 1.1 cm mass in the 9:30 o'clock position of the left breast. No apparent complications. Electronically Signed: By: Claudie Revering M.D. On: 05/13/2020 14:33       IMPRESSION/PLAN: 1. 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 Dr. Lisbeth Renshaw discusses  the pathology findings and reviews the nature of noninvasive left breast disease. The consensus from the breast conference includes breast conservation with lumpectomy. We discussed the role of external radiotherapy to the breast  to reduce risks of local recurrence followed by antiestrogen therapy, but that in favorable situations, provided her margins are clear, radiotherapy may be optional. We discussed the risks, benefits, short, and long term effects of radiotherapy, as well as the curative intent, and the patient is interested in reviewing these options after her surgery. Dr. Lisbeth Renshaw discusses the delivery and logistics of radiotherapy and anticipates a course of 4 weeks of radiotherapy if she needed treatment. We will see her back a few weeks after surgery to discuss the simulation process and anticipate we starting radiotherapy about 4-6 weeks after surgery.    In a visit lasting 60 minutes, greater than 50% of the time was spent face to face reviewing her case, as well as in preparation of, discussing, and coordinating the patient's care.  The above documentation reflects my direct findings during this shared patient visit. Please see the separate note by Dr. Lisbeth Renshaw on this date for the remainder of the patient's plan of care.    Carola Rhine, [facility]    **Disclaimer: This note was dictated with voice recognition software. Similar sounding words can  inadvertently be transcribed and this note may contain transcription errors which may not have been corrected upon publication of note.**

## 2020-05-21 ENCOUNTER — Telehealth: Payer: Self-pay | Admitting: Hematology

## 2020-05-21 ENCOUNTER — Encounter: Payer: Self-pay | Admitting: Genetic Counselor

## 2020-05-21 DIAGNOSIS — Z8042 Family history of malignant neoplasm of prostate: Secondary | ICD-10-CM | POA: Insufficient documentation

## 2020-05-21 DIAGNOSIS — Z803 Family history of malignant neoplasm of breast: Secondary | ICD-10-CM | POA: Insufficient documentation

## 2020-05-21 DIAGNOSIS — Z800 Family history of malignant neoplasm of digestive organs: Secondary | ICD-10-CM | POA: Insufficient documentation

## 2020-05-21 NOTE — Telephone Encounter (Signed)
Checked out appointment. No LOS notes needing to be scheduled. No changes made. 

## 2020-05-21 NOTE — Progress Notes (Unsigned)
REFERRING PROVIDER: Truitt Merle, MD [address] [place],  [place] [address]  PRIMARY PROVIDER:  Willey Blade, MD  PRIMARY REASON FOR VISIT:  1. Ductal carcinoma in situ (DCIS) of left breast   2. Family history of breast cancer   3. Family history of prostate cancer   4. Family history of GI tract cancer      I connected with Kristine Burnett on 05/20/2020 at 3:15 pm EDT by video conference and verified that I am speaking with the correct person using two identifiers.   Patient location: [facility] Provider location: [place] Office  HISTORY OF PRESENT ILLNESS:   Kristine Burnett, a 87 y.o. female, was seen for a [place] cancer genetics consultation at the request of Dr. Burr Medico due to a personal and family history of cancer.  Kristine Burnett presents to clinic today to discuss the possibility of a hereditary predisposition to cancer, genetic testing, and to further clarify her future cancer risks, as well as potential cancer risks for family members.   In February of 2022, at the age of 53, Kristine Burnett was diagnosed with ductal carcinoma in situ of the left breast. The tumor is ER+/PR+. The treatment plan includes surgery and antiestrogen therapy.   CANCER HISTORY:  Oncology History Overview Note  Cancer Staging Ductal carcinoma in situ (DCIS) of left breast Staging form: Breast, AJCC 8th Edition - Clinical stage from 05/13/2020: Stage 0 (cTis (DCIS), cN0, cM0, G3, ER+, PR+, HER2: Not Assessed) - Signed by Truitt Merle, MD on 05/19/2020 Stage prefix: Initial diagnosis    Ductal carcinoma in situ (DCIS) of left breast  05/13/2020 Cancer Staging   Staging form: Breast, AJCC 8th Edition - Clinical stage from 05/13/2020: Stage 0 (cTis (DCIS), cN0, cM0, G3, ER+, PR+, HER2: Not Assessed) - Signed by Truitt Merle, MD on 05/19/2020 Stage prefix: Initial diagnosis   05/13/2020 Mammogram    IMPRESSION:  1.1 x 1.0 x 0.9 cm mass in the 9:30 o'clock position of the  left breast 5 cm from the nipple with imaging features suspicious for malignancy.    05/13/2020 Initial Biopsy   Diagnosis Breast, left, needle core biopsy, 9:30 O'CLOCK left breast AMENDED DIAGNOSIS: - HIGH GRADE DUCTAL CARCINOMA IN SITU WITH CENTRAL NECROSIS AND CALCIFICATIONS. - FOCUS SUSPICIOUS FOR MICROINVASION. Microscopic Comment E-cadherin is positive supporting a ductal phenotype. In performing E-cadherin, subsequent leveling revealed a focus suspicious for microinvasion. Ancillary studies will be reported separately. Revised results reported to [facility] on 05/15/2020. Dr. Tresa Moore reviewed.   05/13/2020 Receptors her2   Results: IMMUNOHISTOCHEMICAL AND MORPHOMETRIC ANALYSIS PERFORMED MANUALLY Estrogen Receptor: 95%, POSITIVE, STRONG STAINING INTENSITY Progesterone Receptor: 95%, POSITIVE, STRONG STAINING INTENSITY   05/18/2020 Initial Diagnosis   Ductal carcinoma in situ (DCIS) of left breast      RISK FACTORS:  Menarche was at age 13.  First live birth at age 3.  OCP use for approximately 0 years.  Menopausal status: postmenopausal.  HRT use: 0 years. Colonoscopy: yes. Mammogram within the last year: yes.   Past Medical History:  Diagnosis Date  . Abnormal EKG   . Asthma   . Carotid artery occlusion   . DDD (degenerative disc disease)   . Diabetes mellitus without complication ([facility])   . Family history of breast cancer   . Family history of GI tract cancer   . Family history of prostate cancer   . Hyperlipidemia   . Hypertension   . PAD (peripheral  artery disease) ([place])   . Right lower quadrant abdominal abscess ([place])   . SBO (small bowel obstruction) ([place]) 03/15/2014  . Spinal stenosis   . Type II or unspecified type diabetes mellitus without mention of complication, not stated as uncontrolled 03/22/2013  . Vitamin D deficiency     Past Surgical History:  Procedure Laterality Date  . CHOLECYSTECTOMY    . CYST EXCISION  back  .  HERNIA REPAIR    . LAPAROSCOPIC APPENDECTOMY N/A 11/15/2014   Procedure: APPENDECTOMY LAPAROSCOPIC;  Surgeon: Michael Boston, MD;  Location: [facility];  Service: General;  Laterality: N/A;  . MINI-LAPAROTOMY W/ TUBAL LIGATION  1964    Social History   Socioeconomic History  . Marital status: Widowed    Spouse name: Not on file  . Number of children: 3  . Years of education: Not on file  . Highest education level: Not on file  Occupational History  . Not on file  Tobacco Use  . Smoking status: Never Smoker  . Smokeless tobacco: Never Used  Vaping Use  . Vaping Use: Never used  Substance and Sexual Activity  . Alcohol use: No    Alcohol/week: 0.0 standard drinks  . Drug use: No  . Sexual activity: Not on file  Other Topics Concern  . Not on file  Social History Narrative  . Not on file   Social Determinants of Health   Financial Resource Strain: Not on file  Food Insecurity: Not on file  Transportation Needs: Not on file  Physical Activity: Not on file  Stress: Not on file  Social Connections: Not on file     FAMILY HISTORY:  We obtained a detailed, 4-generation family history.  Significant diagnoses are listed below: Family History  Problem Relation Age of Onset  . Heart attack Mother   . Hypertension Mother   . Heart attack Father   . Stroke Sister   . Breast cancer Sister   . Prostate cancer Brother   . Heart attack Brother        x 2  . Breast cancer Sister   . Colon cancer Sister   . Breast cancer Sister   . Cancer Maternal Grandmother        Gastrointestinal, dx >50  . Stroke Paternal Grandmother   . Stroke Paternal Grandfather   . Breast cancer Cousin        maternal first cousin   Kristine Burnett has three daughters (ages 34-61). She had three brothers and four sisters. Three sisters have had breast cancer (unknown ages of diagnosis). One brother was diagnosed with prostate cancer (unknown age of diagnosis).  Kristine Burnett mother died at age 75 without  cancer. There was one maternal aunt and two maternal uncles. There is no known cancer among maternal aunts/uncles. One maternal cousin has had breast cancer (unknown age of diagnosis). Kristine Burnett maternal grandmother had a gastrointestinal cancer diagnosed older than 87. She does not have information about her maternal grandfather.  Kristine Burnett father died at age 54 without cancer. There were no paternal aunt or uncles. Kristine Burnett paternal grandparents died without cancer.  Kristine Burnett is unaware of previous family history of genetic testing for hereditary cancer risks. Patient's ancestors are of unknown descent. There is no reported Ashkenazi Jewish ancestry. There is no known consanguinity.  GENETIC COUNSELING ASSESSMENT: Kristine Burnett is a 87 y.o. female with a personal and family history of breast cancer and a family history of prostate cancer and gastrointestinal cancer, which  is somewhat suggestive of a hereditary cancer syndrome and predisposition to cancer. We, therefore, discussed and recommended the following at today's visit.   DISCUSSION: We discussed that approximately 5-10% of breast cancer is hereditary, with most cases associated with the BRCA1 and BRCA2 genes. There are other genes that can be associated with hereditary breast cancer syndromes. These include ATM, CHEK2, PALB2, etc. We discussed that testing is beneficial for several reasons, including knowing about other cancer risks, identifying potential screening and risk-reduction options that may be appropriate, and to understand if other family members could be at risk for cancer and allow them to undergo genetic testing.  We reviewed the characteristics, features and inheritance patterns of hereditary cancer syndromes. We also discussed genetic testing, including the appropriate family members to test, the process of testing, insurance coverage and turn-around-time for results. We discussed the implications of a negative, positive  and/or variant of uncertain significant result. We recommended Kristine Burnett pursue genetic testing for the [facility] CancerNext-Expanded + RNAinsight gene panel.   The CancerNext-Expanded + RNAinsight gene panel offered by [employer] and includes sequencing and rearrangement analysis for the following 77 genes: AIP, ALK, APC, ATM, AXIN2, BAP1, BARD1, BLM, BMPR1A, BRCA1, BRCA2, BRIP1, CDC73, CDH1, CDK4, CDKN1B, CDKN2A, CHEK2, CTNNA1, DICER1, FANCC, FH, FLCN, GALNT12, KIF1B, LZTR1, MAX, MEN1, MET, MLH1, MSH2, MSH3, MSH6, MUTYH, NBN, NF1, NF2, NTHL1, PALB2, PHOX2B, PMS2, POT1, PRKAR1A, PTCH1, PTEN, RAD51C, RAD51D, RB1, RECQL, RET, SDHA, SDHAF2, SDHB, SDHC, SDHD, SMAD4, SMARCA4, SMARCB1, SMARCE1, STK11, SUFU, TMEM127, TP53, TSC1, TSC2, VHL and XRCC2 (sequencing and deletion/duplication); EGFR, EGLN1, HOXB13, KIT, MITF, PDGFRA, POLD1 and POLE (sequencing only); EPCAM and GREM1 (deletion/duplication only). RNA data is routinely analyzed for use in variant interpretation for all genes.  Based on Kristine Burnett's personal and family history of cancer, she meets medical criteria for genetic testing. Despite that she meets criteria, there may still be an out of pocket cost. We discussed that if her out of pocket cost for testing is over $100, the laboratory will reach out to let her know. If the out of pocket cost of testing is less than $100 she will be billed by the genetic testing laboratory.   PLAN: After considering the risks, benefits, and limitations, Kristine Burnett provided informed consent to pursue genetic testing and the blood sample was sent to [facility] for analysis of the CancerNext-Expanded + RNAinsight panel. Results should be available within approximately two-three weeks' time, at which point they will be disclosed by telephone to Kristine Burnett, as will any additional recommendations warranted by these results. Kristine Burnett will receive a summary of her genetic counseling visit and a copy of her  results once available. This information will also be available in Epic.   Kristine Burnett questions were answered to her satisfaction today. Our contact information was provided should additional questions or concerns arise. Thank you for the referral and allowing [place] to share in the care of your patient.   Clint Guy, [place], [facility] Licensed, Certified Dispensing [email or web address] Phone: [phone]  The patient was seen for a total of 25 minutes in face-to-face genetic counseling.  This patient was discussed with Drs. Magrinat, Lindi Adie and/or Burr Medico who agrees with the above.    _______________________________________________________________________ For Office Staff:  Number of people involved in session: 1 Was an Intern/ student involved with case: no

## 2020-05-25 ENCOUNTER — Other Ambulatory Visit: Payer: Self-pay | Admitting: *Deleted

## 2020-05-25 ENCOUNTER — Other Ambulatory Visit: Payer: Self-pay | Admitting: General Surgery

## 2020-05-25 DIAGNOSIS — D0512 Intraductal carcinoma in situ of left breast: Secondary | ICD-10-CM

## 2020-05-26 ENCOUNTER — Telehealth: Payer: Self-pay | Admitting: *Deleted

## 2020-05-26 ENCOUNTER — Encounter: Payer: Self-pay | Admitting: *Deleted

## 2020-05-26 NOTE — Telephone Encounter (Signed)
Left message for a return phone call to follow up from [facility] and assess navigation needs. 

## 2020-06-02 ENCOUNTER — Encounter: Payer: Self-pay | Admitting: Dietician

## 2020-06-02 NOTE — Progress Notes (Signed)
Nutrition  Patient identified after attending Breast Clinic on 05/20/20. Patient given nutrition packet with RD contact information by nurse navigator at that time.  Chart reviewed.  Patient with newly diagnosed DCIS of left breast. She is a candidate for breast conservation surgery and is scheduled for lumpectomy with Dr. Marlou Starks on 06/17/20.   Given her advanced age, she may forego adjuvant radiotherapy. Plans to re-evaluate postoperatively. Patient is agreeable to adjuvant antiestrogen therap and will follow-up with Dr. Annamaria Boots 1-2 months with plans to start Tamoxifen at that time.   Current wt 162 lb 6.4 oz (73.7 kg) No recent wt history for review.   Patient is not currently at nutrition risk. Please consult RD if nutrition issues arise.  Kristine Burnett, RD, LDN Clinical Nutrition After Hours/Weekend Pager # in [place]

## 2020-06-10 DIAGNOSIS — Z1379 Encounter for other screening for genetic and chromosomal anomalies: Secondary | ICD-10-CM | POA: Insufficient documentation

## 2020-06-11 ENCOUNTER — Encounter: Payer: Self-pay | Admitting: Genetic Counselor

## 2020-06-11 ENCOUNTER — Encounter (HOSPITAL_BASED_OUTPATIENT_CLINIC_OR_DEPARTMENT_OTHER): Payer: Self-pay | Admitting: General Surgery

## 2020-06-11 ENCOUNTER — Telehealth: Payer: Self-pay | Admitting: Genetic Counselor

## 2020-06-11 ENCOUNTER — Other Ambulatory Visit: Payer: Self-pay

## 2020-06-11 ENCOUNTER — Ambulatory Visit: Payer: Self-pay | Admitting: Genetic Counselor

## 2020-06-11 DIAGNOSIS — Z1379 Encounter for other screening for genetic and chromosomal anomalies: Secondary | ICD-10-CM

## 2020-06-11 NOTE — Telephone Encounter (Signed)
Revealed negative genetic testing to Ms. Brossard's daughter, Kristine Burnett, per patient request. Discussed that we do not know why Ms. Crudup has breast cancer or why there is cancer in the family. There could be a genetic mutation in the family that Ms. Allmendinger did not inherit. There could also be a mutation in a different gene that we are not testing, or our current technology may not be able to detect certain mutations. It will therefore be important for her to stay in contact with genetics to keep up with whether additional testing may be appropriate in the future.   Two variants of uncertain significance were detected - one in the BRCA1 gene called c.4735C>G (p.P1579A) and a second in the BRCA2 gene called c.1741T>C (p.S581P). Her result is still considered normal at this time and should not impact her medical management.

## 2020-06-11 NOTE — Progress Notes (Signed)
HPI:  Kristine Burnett was previously seen in the [place] clinic due to a personal and family history of cancer and concerns regarding a hereditary predisposition to cancer. Please refer to our prior cancer genetics clinic note for more information regarding our discussion, assessment and recommendations, at the time. Kristine Burnett recent genetic test results were disclosed to her, as were recommendations warranted by these results. These results and recommendations are discussed in more detail below.  CANCER HISTORY:  Oncology History Overview Note  Cancer Staging Ductal carcinoma in situ (DCIS) of left breast Staging form: Breast, AJCC 8th Edition - Clinical stage from 05/13/2020: Stage 0 (cTis (DCIS), cN0, cM0, G3, ER+, PR+, HER2: Not Assessed) - Signed by Truitt Merle, MD on 05/19/2020 Stage prefix: Initial diagnosis    Ductal carcinoma in situ (DCIS) of left breast  05/13/2020 Cancer Staging   Staging form: Breast, AJCC 8th Edition - Clinical stage from 05/13/2020: Stage 0 (cTis (DCIS), cN0, cM0, G3, ER+, PR+, HER2: Not Assessed) - Signed by Truitt Merle, MD on 05/19/2020 Stage prefix: Initial diagnosis   05/13/2020 Mammogram    IMPRESSION:  1.1 x 1.0 x 0.9 cm mass in the 9:30 o'clock position of the left breast 5 cm from the nipple with imaging features suspicious for malignancy.    05/13/2020 Initial Biopsy   Diagnosis Breast, left, needle core biopsy, 9:30 O'CLOCK left breast AMENDED DIAGNOSIS: - HIGH GRADE DUCTAL CARCINOMA IN SITU WITH CENTRAL NECROSIS AND CALCIFICATIONS. - FOCUS SUSPICIOUS FOR MICROINVASION. Microscopic Comment E-cadherin is positive supporting a ductal phenotype. In performing E-cadherin, subsequent leveling revealed a focus suspicious for microinvasion. Ancillary studies will be reported separately. Revised results reported to [facility] on 05/15/2020. Dr. Tresa Moore reviewed.   05/13/2020 Receptors her2   Results: IMMUNOHISTOCHEMICAL  AND MORPHOMETRIC ANALYSIS PERFORMED MANUALLY Estrogen Receptor: 95%, POSITIVE, STRONG STAINING INTENSITY Progesterone Receptor: 95%, POSITIVE, STRONG STAINING INTENSITY   05/18/2020 Initial Diagnosis   Ductal carcinoma in situ (DCIS) of left breast   06/10/2020 Genetic Testing   Negative genetic testing:  No pathogenic variants detected on the [facility] CancerNext-Expanded + RNAinsight panel. Two variants of uncertain significance (VUS) were detected - one in the BRCA1 gene called c.4735C>G (p.P1579A) and a second in the BRCA2 gene called c.1741T>C (p.S581P). The report date is 06/10/2020.  The CancerNext-Expanded + RNAinsight gene panel offered by [employer] and includes sequencing and rearrangement analysis for the following 77 genes: AIP, ALK, APC, ATM, AXIN2, BAP1, BARD1, BLM, BMPR1A, BRCA1, BRCA2, BRIP1, CDC73, CDH1, CDK4, CDKN1B, CDKN2A, CHEK2, CTNNA1, DICER1, FANCC, FH, FLCN, GALNT12, KIF1B, LZTR1, MAX, MEN1, MET, MLH1, MSH2, MSH3, MSH6, MUTYH, NBN, NF1, NF2, NTHL1, PALB2, PHOX2B, PMS2, POT1, PRKAR1A, PTCH1, PTEN, RAD51C, RAD51D, RB1, RECQL, RET, SDHA, SDHAF2, SDHB, SDHC, SDHD, SMAD4, SMARCA4, SMARCB1, SMARCE1, STK11, SUFU, TMEM127, TP53, TSC1, TSC2, VHL and XRCC2 (sequencing and deletion/duplication); EGFR, EGLN1, HOXB13, KIT, MITF, PDGFRA, POLD1 and POLE (sequencing only); EPCAM and GREM1 (deletion/duplication only). RNA data is routinely analyzed for use in variant interpretation for all genes.      FAMILY HISTORY:  We obtained a detailed, 4-generation family history.  Significant diagnoses are listed below: Family History  Problem Relation Age of Onset  . Heart attack Mother   . Hypertension Mother   . Heart attack Father   . Stroke Sister   . Breast cancer Sister   . Prostate cancer Brother   . Heart attack Brother        x 2  . Breast cancer Sister   .  Colon cancer Sister   . Breast cancer Sister   . Cancer Maternal Grandmother        Gastrointestinal, dx >50  . Stroke  Paternal Grandmother   . Stroke Paternal Grandfather   . Breast cancer Cousin        maternal first cousin   Kristine Burnett has three daughters (ages 45-61). She had three brothers and four sisters. Three sisters have had breast cancer (unknown ages of diagnosis). One brother was diagnosed with prostate cancer (unknown age of diagnosis).  Kristine Burnett mother died at age 42 without cancer. There was one maternal aunt and two maternal uncles. There is no known cancer among maternal aunts/uncles. One maternal cousin has had breast cancer (unknown age of diagnosis). Kristine Burnett maternal grandmother had a gastrointestinal cancer diagnosed older than 30. She does not have information about her maternal grandfather.  Kristine Burnett father died at age 16 without cancer. There were no paternal aunt or uncles. Kristine Burnett paternal grandparents died without cancer.  Kristine Burnett is unaware of previous family history of genetic testing for hereditary cancer risks. Patient's ancestors are of unknown descent. There is no reported Ashkenazi Jewish ancestry. There is no known consanguinity.  GENETIC TEST RESULTS: Genetic testing reported out on 06/10/2020 through the [facility] + RNAinsight panel. No pathogenic variants were detected.   The CancerNext-Expanded + RNAinsight gene panel offered by [employer] and includes sequencing and rearrangement analysis for the following 77 genes: AIP, ALK, APC, ATM, AXIN2, BAP1, BARD1, BLM, BMPR1A, BRCA1, BRCA2, BRIP1, CDC73, CDH1, CDK4, CDKN1B, CDKN2A, CHEK2, CTNNA1, DICER1, FANCC, FH, FLCN, GALNT12, KIF1B, LZTR1, MAX, MEN1, MET, MLH1, MSH2, MSH3, MSH6, MUTYH, NBN, NF1, NF2, NTHL1, PALB2, PHOX2B, PMS2, POT1, PRKAR1A, PTCH1, PTEN, RAD51C, RAD51D, RB1, RECQL, RET, SDHA, SDHAF2, SDHB, SDHC, SDHD, SMAD4, SMARCA4, SMARCB1, SMARCE1, STK11, SUFU, TMEM127, TP53, TSC1, TSC2, VHL and XRCC2 (sequencing and deletion/duplication); EGFR, EGLN1, HOXB13, KIT, MITF, PDGFRA, POLD1  and POLE (sequencing only); EPCAM and GREM1 (deletion/duplication only). RNA data is routinely analyzed for use in variant interpretation for all genes. The test report will be scanned into EPIC and located under the Molecular Pathology section of the Results Review tab.  A portion of the result report is included below for reference.     We discussed with Kristine Burnett that because current genetic testing is not perfect, it is possible there may be a gene mutation in one of these genes that current testing cannot detect, but that chance is small.  We also discussed that there could be another gene that has not yet been discovered, or that we have not yet tested, that is responsible for the cancer diagnoses in the family. It is also possible there is a hereditary cause for the cancer in the family that Kristine Burnett did not inherit and therefore was not identified in her testing.  Therefore, it is important to remain in touch with cancer genetics in the future so that we can continue to offer Kristine Burnett the most up to date genetic testing.   Genetic testing did identify two variants of uncertain significance (VUS) - one in the BRCA1 gene called c.4735C>G (p.P1579A) and a second in the BRCA2 gene called c.1741T>C (p.S581P).  At this time, it is unknown if these variants are associated with increased cancer risk or if they are normal findings, but most variants such as these get reclassified to being inconsequential. They should not be used to make medical management decisions. With time, we suspect the lab will  determine the significance of these variants, if any. If we do learn more about them, we will try to contact Kristine Burnett to discuss it further. However, it is important to stay in touch with [place] periodically and keep the address and phone number up to date.  CANCER SCREENING RECOMMENDATIONS: Kristine Burnett test result is considered negative (normal).  This means that we have not identified a hereditary cause for  her personal and family history of cancer at this time. While reassuring, this does not definitively rule out a hereditary predisposition to cancer. It is still possible that there could be genetic mutations that are undetectable by current technology. There could be genetic mutations in genes that have not been tested or identified to increase cancer risk.  Therefore, it is recommended she continue to follow the cancer management and screening guidelines provided by her oncology and primary healthcare provider.   An individual's cancer risk and medical management are not determined by genetic test results alone. Overall cancer risk assessment incorporates additional factors, including personal medical history, family history, and any available genetic information that may result in a personalized plan for cancer prevention and surveillance.  RECOMMENDATIONS FOR FAMILY MEMBERS:  Individuals in this family might be at some increased risk of developing cancer, over the general population risk, simply due to the family history of cancer.  We recommended women in this family have a yearly mammogram beginning at age 26, or 51 years younger than the earliest onset of cancer, an annual clinical breast exam, and perform monthly breast self-exams. Women in this family should also have a gynecological exam as recommended by their primary provider. All family members should be referred for colonoscopy starting at age 25.   It is also possible there is a hereditary cause for the cancer in Kristine Burnett's family that she did not inherit and therefore was not identified in her.  Based on Ms. Mavis's family history, we recommended her sisters who were diagnosed with breast cancer, as well as her brother with prostate cancer have genetic counseling and testing. Ms. Lenderman will let us know if we can be of any assistance in coordinating genetic counseling and/or testing for these family members.   FOLLOW-UP: Lastly, we  discussed with Ms. Padgett that cancer genetics is a rapidly advancing field and it is possible that new genetic tests will be appropriate for her and/or her family members in the future. We encouraged her to remain in contact with cancer genetics on an annual basis so we can update her personal and family histories and let her know of advances in cancer genetics that may benefit this family.   Our contact number was provided. Ms. Busbin questions were answered to her satisfaction, and she knows she is welcome to call us at anytime with additional questions or concerns.   Clint Guy, MS, [facility] Genetic Counselor [email or web address] Phone: [phone]

## 2020-06-12 ENCOUNTER — Encounter (HOSPITAL_BASED_OUTPATIENT_CLINIC_OR_DEPARTMENT_OTHER)
Admission: RE | Admit: 2020-06-12 | Discharge: 2020-06-12 | Disposition: A | Discharge status: Home / Self Care | Payer: Medicare HMO | Source: Ambulatory Visit | Attending: General Surgery | Admitting: General Surgery

## 2020-06-12 DIAGNOSIS — Z01818 Encounter for other preprocedural examination: Secondary | ICD-10-CM | POA: Insufficient documentation

## 2020-06-12 LAB — BASIC METABOLIC PANEL
Anion gap: 7 (ref 5–15)
BUN: 28 mg/dL — ABNORMAL HIGH (ref 8–23)
CO2: 25 mmol/L (ref 22–32)
Calcium: 9.4 mg/dL (ref 8.9–10.3)
Chloride: 102 mmol/L (ref 98–111)
Creatinine, Ser: 0.99 mg/dL (ref 0.44–1.00)
GFR, Estimated: 55 mL/min — ABNORMAL LOW (ref >60)
Glucose, Bld: 144 mg/dL — ABNORMAL HIGH (ref 70–99)
Potassium: 4.3 mmol/L (ref 3.5–5.1)
Sodium: 134 mmol/L — ABNORMAL LOW (ref 135–145)

## 2020-06-12 NOTE — Progress Notes (Signed)

## 2020-06-13 ENCOUNTER — Other Ambulatory Visit (HOSPITAL_COMMUNITY)
Admission: RE | Admit: 2020-06-13 | Discharge: 2020-06-13 | Disposition: A | Discharge status: Home / Self Care | Payer: Medicare HMO | Source: Ambulatory Visit | Attending: General Surgery | Admitting: General Surgery

## 2020-06-13 DIAGNOSIS — Z20822 Contact with and (suspected) exposure to covid-19: Secondary | ICD-10-CM | POA: Diagnosis not present

## 2020-06-13 DIAGNOSIS — Z01812 Encounter for preprocedural laboratory examination: Secondary | ICD-10-CM | POA: Diagnosis present

## 2020-06-13 LAB — SARS CORONAVIRUS 2 (TAT 6-24 HRS): SARS Coronavirus 2: NEGATIVE

## 2020-06-16 ENCOUNTER — Other Ambulatory Visit: Payer: Self-pay

## 2020-06-16 ENCOUNTER — Ambulatory Visit
Admission: RE | Admit: 2020-06-16 | Discharge: 2020-06-16 | Disposition: A | Discharge status: Home / Self Care | Payer: Medicare HMO | Source: Ambulatory Visit | Attending: General Surgery | Admitting: General Surgery

## 2020-06-16 DIAGNOSIS — D0512 Intraductal carcinoma in situ of left breast: Secondary | ICD-10-CM

## 2020-06-17 ENCOUNTER — Encounter (HOSPITAL_BASED_OUTPATIENT_CLINIC_OR_DEPARTMENT_OTHER)
Admission: RE | Disposition: A | Discharge status: Home / Self Care | Payer: Self-pay | Source: Home / Self Care | Attending: General Surgery

## 2020-06-17 ENCOUNTER — Other Ambulatory Visit: Payer: Self-pay

## 2020-06-17 ENCOUNTER — Encounter (HOSPITAL_BASED_OUTPATIENT_CLINIC_OR_DEPARTMENT_OTHER): Payer: Self-pay | Admitting: General Surgery

## 2020-06-17 ENCOUNTER — Ambulatory Visit (HOSPITAL_BASED_OUTPATIENT_CLINIC_OR_DEPARTMENT_OTHER): Payer: Medicare HMO | Admitting: Certified Registered"

## 2020-06-17 ENCOUNTER — Ambulatory Visit
Admission: RE | Admit: 2020-06-17 | Discharge: 2020-06-17 | Disposition: A | Discharge status: Home / Self Care | Payer: Medicare HMO | Source: Ambulatory Visit | Attending: General Surgery | Admitting: General Surgery

## 2020-06-17 ENCOUNTER — Ambulatory Visit (HOSPITAL_BASED_OUTPATIENT_CLINIC_OR_DEPARTMENT_OTHER)
Admission: RE | Admit: 2020-06-17 | Discharge: 2020-06-17 | Disposition: A | Discharge status: Home / Self Care | Payer: Medicare HMO | Attending: General Surgery | Admitting: General Surgery

## 2020-06-17 DIAGNOSIS — D0512 Intraductal carcinoma in situ of left breast: Secondary | ICD-10-CM | POA: Insufficient documentation

## 2020-06-17 DIAGNOSIS — I10 Essential (primary) hypertension: Secondary | ICD-10-CM | POA: Diagnosis not present

## 2020-06-17 DIAGNOSIS — Z803 Family history of malignant neoplasm of breast: Secondary | ICD-10-CM | POA: Diagnosis not present

## 2020-06-17 DIAGNOSIS — Z9049 Acquired absence of other specified parts of digestive tract: Secondary | ICD-10-CM | POA: Diagnosis not present

## 2020-06-17 DIAGNOSIS — Z8249 Family history of ischemic heart disease and other diseases of the circulatory system: Secondary | ICD-10-CM | POA: Insufficient documentation

## 2020-06-17 HISTORY — PX: BREAST LUMPECTOMY: SHX2

## 2020-06-17 HISTORY — PX: BREAST LUMPECTOMY WITH RADIOACTIVE SEED LOCALIZATION: SHX6424

## 2020-06-17 SURGERY — BREAST LUMPECTOMY WITH RADIOACTIVE SEED LOCALIZATION
Anesthesia: General | Site: Breast | Laterality: Left

## 2020-06-17 MED ORDER — HYDROCODONE-ACETAMINOPHEN 5-325 MG PO TABS: 1.0000 | ORAL_TABLET | Freq: Four times a day (QID) | ORAL | 0 refills | Status: DC | PRN

## 2020-06-17 MED ORDER — OXYCODONE HCL 5 MG/5ML PO SOLN
5.0000 mg | Freq: Once | ORAL | Status: DC | PRN
Start: 2020-06-17 — End: 2020-06-17

## 2020-06-17 MED ORDER — PROPOFOL 500 MG/50ML IV EMUL
INTRAVENOUS | Status: DC | PRN
  Administered 2020-06-17: 25 ug/kg/min via INTRAVENOUS

## 2020-06-17 MED ORDER — LIDOCAINE HCL (CARDIAC) PF 100 MG/5ML IV SOSY
PREFILLED_SYRINGE | INTRAVENOUS | Status: DC | PRN
  Administered 2020-06-17: 60 mg via INTRAVENOUS

## 2020-06-17 MED ORDER — CHLORHEXIDINE GLUCONATE CLOTH 2 % EX PADS: 6.0000 | MEDICATED_PAD | Freq: Once | CUTANEOUS | Status: DC

## 2020-06-17 MED ORDER — FENTANYL CITRATE (PF) 100 MCG/2ML IJ SOLN
INTRAMUSCULAR | Status: AC
  Filled 2020-06-17: qty 2

## 2020-06-17 MED ORDER — LACTATED RINGERS IV SOLN: INTRAVENOUS | Status: DC

## 2020-06-17 MED ORDER — VANCOMYCIN HCL IN DEXTROSE 1-5 GM/200ML-% IV SOLN
INTRAVENOUS | Status: AC
  Filled 2020-06-17: qty 200

## 2020-06-17 MED ORDER — FENTANYL CITRATE (PF) 100 MCG/2ML IJ SOLN
INTRAMUSCULAR | Status: DC | PRN
  Administered 2020-06-17: 25 ug via INTRAVENOUS
  Administered 2020-06-17: 50 ug via INTRAVENOUS

## 2020-06-17 MED ORDER — BUPIVACAINE-EPINEPHRINE (PF) 0.25% -1:200000 IJ SOLN
INTRAMUSCULAR | Status: DC | PRN
  Administered 2020-06-17: 20 mL

## 2020-06-17 MED ORDER — ONDANSETRON HCL 4 MG/2ML IJ SOLN: 4.0000 mg | Freq: Once | INTRAMUSCULAR | Status: DC | PRN

## 2020-06-17 MED ORDER — OXYCODONE HCL 5 MG PO TABS: 5.0000 mg | ORAL_TABLET | Freq: Once | ORAL | Status: DC | PRN

## 2020-06-17 MED ORDER — ACETAMINOPHEN 500 MG PO TABS: 1000.0000 mg | ORAL_TABLET | ORAL | Status: DC

## 2020-06-17 MED ORDER — FENTANYL CITRATE (PF) 100 MCG/2ML IJ SOLN: 25.0000 ug | INTRAMUSCULAR | Status: DC | PRN

## 2020-06-17 MED ORDER — ONDANSETRON HCL 4 MG/2ML IJ SOLN
INTRAMUSCULAR | Status: DC | PRN
  Administered 2020-06-17: 4 mg via INTRAVENOUS

## 2020-06-17 MED ORDER — VANCOMYCIN HCL IN DEXTROSE 1-5 GM/200ML-% IV SOLN
1000.0000 mg | INTRAVENOUS | Status: AC
  Administered 2020-06-17: 1000 mg via INTRAVENOUS

## 2020-06-17 MED ORDER — GABAPENTIN 300 MG PO CAPS: 300.0000 mg | ORAL_CAPSULE | ORAL | Status: DC

## 2020-06-17 MED ORDER — DEXAMETHASONE SODIUM PHOSPHATE 4 MG/ML IJ SOLN
INTRAMUSCULAR | Status: DC | PRN
  Administered 2020-06-17: 4 mg via INTRAVENOUS

## 2020-06-17 SURGICAL SUPPLY — 43 items
ADH SKN CLS APL DERMABOND .7 (GAUZE/BANDAGES/DRESSINGS) ×1
APL PRP STRL LF DISP 70% ISPRP (MISCELLANEOUS) ×1
APPLIER CLIP 9.375 MED OPEN (MISCELLANEOUS)
APR CLP MED 9.3 20 MLT OPN (MISCELLANEOUS)
BLADE SURG 15 STRL LF DISP TIS (BLADE) ×1 IMPLANT
BLADE SURG 15 STRL SS (BLADE) ×2
CANISTER SUC SOCK COL 7IN (MISCELLANEOUS) ×2 IMPLANT
CANISTER SUCT 1200ML W/VALVE (MISCELLANEOUS) ×2 IMPLANT
CHLORAPREP W/TINT 26 (MISCELLANEOUS) ×2 IMPLANT
CLIP APPLIE 9.375 MED OPEN (MISCELLANEOUS) IMPLANT
COVER BACK TABLE 60X90IN (DRAPES) ×2 IMPLANT
COVER MAYO STAND STRL (DRAPES) ×2 IMPLANT
COVER PROBE W GEL 5X96 (DRAPES) ×2 IMPLANT
COVER WAND RF STERILE (DRAPES) IMPLANT
DECANTER SPIKE VIAL GLASS SM (MISCELLANEOUS) IMPLANT
DERMABOND ADVANCED (GAUZE/BANDAGES/DRESSINGS) ×1
DERMABOND ADVANCED .7 DNX12 (GAUZE/BANDAGES/DRESSINGS) ×1 IMPLANT
DRAPE LAPAROSCOPIC ABDOMINAL (DRAPES) ×2 IMPLANT
DRAPE UTILITY XL STRL (DRAPES) ×2 IMPLANT
ELECT COATED BLADE 2.86 ST (ELECTRODE) ×2 IMPLANT
ELECT REM PT RETURN 9FT ADLT (ELECTROSURGICAL) ×2
ELECTRODE REM PT RTRN 9FT ADLT (ELECTROSURGICAL) ×1 IMPLANT
GLOVE SURG ENC MOIS LTX SZ7.5 (GLOVE) ×4 IMPLANT
GOWN STRL REUS W/ TWL LRG LVL3 (GOWN DISPOSABLE) ×2 IMPLANT
GOWN STRL REUS W/TWL LRG LVL3 (GOWN DISPOSABLE) ×4
ILLUMINATOR WAVEGUIDE N/F (MISCELLANEOUS) IMPLANT
KIT MARKER MARGIN INK (KITS) ×2 IMPLANT
LIGHT WAVEGUIDE WIDE FLAT (MISCELLANEOUS) IMPLANT
NDL HYPO 25X1 1.5 SAFETY (NEEDLE) IMPLANT
NEEDLE HYPO 25X1 1.5 SAFETY (NEEDLE) ×2 IMPLANT
NS IRRIG 1000ML POUR BTL (IV SOLUTION) IMPLANT
PACK BASIN DAY SURGERY FS (CUSTOM PROCEDURE TRAY) ×2 IMPLANT
PENCIL SMOKE EVACUATOR (MISCELLANEOUS) ×2 IMPLANT
SLEEVE SCD COMPRESS KNEE MED (STOCKING) ×2 IMPLANT
SPONGE LAP 18X18 RF (DISPOSABLE) ×2 IMPLANT
SUT MON AB 4-0 PC3 18 (SUTURE) ×2 IMPLANT
SUT SILK 2 0 SH (SUTURE) IMPLANT
SUT VICRYL 3-0 CR8 SH (SUTURE) ×2 IMPLANT
SYR CONTROL 10ML LL (SYRINGE) ×1 IMPLANT
TOWEL GREEN STERILE FF (TOWEL DISPOSABLE) ×2 IMPLANT
TRAY FAXITRON CT DISP (TRAY / TRAY PROCEDURE) ×2 IMPLANT
TUBE CONNECTING 20X1/4 (TUBING) ×2 IMPLANT
YANKAUER SUCT BULB TIP NO VENT (SUCTIONS) IMPLANT

## 2020-06-17 NOTE — Interval H&P Note (Signed)
History and Physical Interval Note:  06/17/2020 9:11 AM  Kristine Burnett  has presented today for surgery, with the diagnosis of LEFT BREAST DCIS.  The various methods of treatment have been discussed with the patient and family. After consideration of risks, benefits and other options for treatment, the patient has consented to  Procedure(s): LEFT BREAST LUMPECTOMY WITH RADIOACTIVE SEED LOCALIZATION (Left) as a surgical intervention.  The patient's history has been reviewed, patient examined, no change in status, stable for surgery.  I have reviewed the patient's chart and labs.  Questions were answered to the patient's satisfaction.     [name]

## 2020-06-17 NOTE — Anesthesia Postprocedure Evaluation (Signed)
Anesthesia Post Note  Patient: Kristine Burnett  Procedure(s) Performed: LEFT BREAST LUMPECTOMY WITH RADIOACTIVE SEED LOCALIZATION (Left Breast)     Anesthesia Post Evaluation No complications documented.  Last Vitals:  Vitals:   06/17/20 1100 06/17/20 1142  BP: 138/67 (!) 145/72  Pulse: 76 72  Resp: 16 16  Temp:  36.6 C  SpO2: 100% 98%    Last Pain:  Vitals:   06/17/20 1142  TempSrc:   PainSc: 0-No pain                 Jesenya Bowditch COKER

## 2020-06-17 NOTE — Transfer of Care (Signed)
Immediate Anesthesia Transfer of Care Note  Patient: Kristine Burnett  Procedure(s) Performed: LEFT BREAST LUMPECTOMY WITH RADIOACTIVE SEED LOCALIZATION (Left Breast)  Patient Location: PACU  Anesthesia Type:General  Level of Consciousness: drowsy and patient cooperative  Airway & Oxygen Therapy: Patient Spontanous Breathing and Patient connected to face mask oxygen  Post-op Assessment: Report given to RN and Post -op Vital signs reviewed and stable  Post vital signs: Reviewed and stable  Last Vitals:  Vitals Value Taken Time  BP 126/58 06/17/20 1041  Temp    Pulse 57 06/17/20 1043  Resp 14 06/17/20 1043  SpO2 98 % 06/17/20 1043  Vitals shown include unvalidated device data.  Last Pain:  Vitals:   06/17/20 0905  TempSrc: Oral  PainSc: 4       Patients Stated Pain Goal: 5 (38/37/79 [id number])  Complications: No complications documented.

## 2020-06-17 NOTE — Anesthesia Procedure Notes (Signed)
Procedure Name: LMA Insertion Date/Time: 06/17/2020 9:52 AM Performed by: Signe Colt, CRNA Pre-anesthesia Checklist: Patient identified, Emergency Drugs available, Suction available and Patient being monitored Patient Re-evaluated:Patient Re-evaluated prior to induction Oxygen Delivery Method: Circle System Utilized Preoxygenation: Pre-oxygenation with 100% oxygen Induction Type: IV induction Ventilation: Mask ventilation without difficulty LMA: LMA inserted LMA Size: 4.0 Number of attempts: 1 Airway Equipment and Method: bite block Placement Confirmation: positive ETCO2 Tube secured with: Tape Dental Injury: Teeth and Oropharynx as per pre-operative assessment

## 2020-06-17 NOTE — Op Note (Signed)
06/17/2020  10:37 AM  PATIENT:  Kristine Burnett  87 y.o. female  PRE-OPERATIVE DIAGNOSIS:  LEFT BREAST DUCTAL CARCINOMA IN SITU  POST-OPERATIVE DIAGNOSIS:  LEFT BREAST DUCTAL CARCINOMA IN SITU  PROCEDURE:  Procedure(s): LEFT BREAST LUMPECTOMY WITH RADIOACTIVE SEED LOCALIZATION (Left)  SURGEON:  Surgeon(s) and Role:    * Jovita Kussmaul, MD - Primary  PHYSICIAN ASSISTANT:   ASSISTANTS: none   ANESTHESIA:   local and general  EBL:  minimal   BLOOD ADMINISTERED:none  DRAINS: none   LOCAL MEDICATIONS USED:  MARCAINE     SPECIMEN:  Source of Specimen:  left breast tissue  DISPOSITION OF SPECIMEN:  PATHOLOGY  COUNTS:  YES  TOURNIQUET:  * No tourniquets in log *  DICTATION: .Dragon Dictation   After informed consent was obtained the patient was brought to the operating room and placed in the supine position on the operating table.  After adequate induction of general anesthesia the patient's left chest, breast, and axillary area were prepped with ChloraPrep, allowed to dry, and draped in usual sterile manner.  An appropriate timeout was performed.  Previously an I-125 seed was placed in the medial aspect of the left breast to mark an area of ductal carcinoma in situ.  The neoprobe was set to I-125 in the area of radioactivity was readily identified.  The area around this was infiltrated with quarter percent Marcaine.  Because of the shallow nature of the seed I elected to make an elliptical incision in the skin overlying the area of radioactivity with a 15 blade knife.  The incision was carried through the skin and subcutaneous tissue sharply with the electrocautery.  The dissection was carried widely around the radioactive seed while checking the area of radioactivity frequently until the dissection reached the chest wall.  Once the specimen was removed it was oriented with the appropriate paint colors.  A specimen radiograph was obtained that showed the clip and seed to be near the  center of the specimen.  The specimen was then sent to pathology for further evaluation.  Hemostasis was achieved using the Bovie electrocautery.  The wound was irrigated with saline and infiltrated with more quarter percent Marcaine.  The deep layer of the wound was then closed with layers of interrupted 3-0 Vicryl stitches.  The skin was closed with a running 4-0 Monocryl subcuticular stitch.  Dermabond dressings were applied.  The patient tolerated the procedure well.  At the end of the case all needle sponge and instrument counts were correct.  The patient was then awakened and taken to recovery in stable condition.  PLAN OF CARE: Discharge to home after PACU  PATIENT DISPOSITION:  PACU - hemodynamically stable.   Delay start of Pharmacological VTE agent (>24hrs) due to surgical blood loss or risk of bleeding: not applicable

## 2020-06-17 NOTE — Discharge Instructions (Signed)

## 2020-06-17 NOTE — Anesthesia Preprocedure Evaluation (Signed)
Anesthesia Evaluation   Patient awake    Reviewed: Allergy & Precautions, NPO status , Patient's Chart, lab work & pertinent test results  Airway Mallampati: II  TM Distance: >3 FB Neck ROM: Full    Dental  (+) Edentulous Upper, Edentulous Lower   Pulmonary    breath sounds clear to auscultation       Cardiovascular hypertension,  Rhythm:Regular Rate:Normal     Neuro/Psych    GI/Hepatic   Endo/Other  diabetes  Renal/GU      Musculoskeletal   Abdominal   Peds  Hematology   Anesthesia Other Findings   Reproductive/Obstetrics                             Anesthesia Physical Anesthesia Plan  ASA: III  Anesthesia Plan: General   Post-op Pain Management:    Induction: Intravenous  PONV Risk Score and Plan: Ondansetron and Dexamethasone  Airway Management Planned: LMA  Additional Equipment:   Intra-op Plan:   Post-operative Plan: Extubation in OR  Informed Consent: I have reviewed the patients History and Physical, chart, labs and discussed the procedure including the risks, benefits and alternatives for the proposed anesthesia with the patient or authorized representative who has indicated his/her understanding and acceptance.       Plan Discussed with: CRNA and Anesthesiologist  Anesthesia Plan Comments:         Anesthesia Quick Evaluation

## 2020-06-17 NOTE — H&P (Signed)
Kristine Burnett  Location: [facility] Patient #: [id number] DOB: 09-27-1929 Widowed / Language: Cleophus Molt / Race: Black or African American Female   History of Present Illness  The patient is a 87 year old female who presents with breast cancer.We are asked to see the patient in consultation by Dr. Burr Medico to evaluate her for a new left breast cancer. The patient is a 87 year old black female who presents with a screen detected left breast mass and calcs measuring about 1.1cm. The axilla looked neg. this was biopsied and came back as high grade dcis that was ER and PR +. She does have a family history of breast cancer in 3 sisters. She also has HTN but is otherwise doing well for her age. She does not smoke.   Past Surgical History  Appendectomy  Gallbladder Surgery - Laparoscopic   Diagnostic Studies History  Colonoscopy  >10 years ago 5-10 years ago Mammogram  within last year Pap Smear  >5 years ago  Medication History  Medications Reconciled  Social History  Caffeine use  Coffee. No alcohol use  No drug use  Tobacco use  Never smoker.  Family History  Breast Cancer  Sister. Heart Disease  Brother, Father, Mother, Sister.  Pregnancy / Birth History  Age at menarche  12 years. Age of menopause  60-55 <47 Gravida  3 Irregular periods  Maternal age  19-30 Para  3  Other Problems  Asthma  High blood pressure     Review of Systems  General Not Present- Appetite Loss, Chills, Fatigue, Fever, Night Sweats, Weight Gain and Weight Loss. Skin Not Present- Change in Wart/Mole, Dryness, Hives, Jaundice, New Lesions, Non-Healing Wounds, Rash and Ulcer. HEENT Not Present- Earache, Hearing Loss, Hoarseness, Nose Bleed, Oral Ulcers, Ringing in the Ears, Seasonal Allergies, Sinus Pain, Sore Throat, Visual Disturbances, Wears glasses/contact lenses and Yellow Eyes. Respiratory Not Present- Bloody sputum, Chronic Cough, Difficulty Breathing, Snoring and  Wheezing. Breast Not Present- Breast Mass, Breast Pain, Nipple Discharge and Skin Changes. Cardiovascular Not Present- Chest Pain, Difficulty Breathing Lying Down, Leg Cramps, Palpitations, Rapid Heart Rate, Shortness of Breath and Swelling of Extremities. Gastrointestinal Not Present- Abdominal Pain, Bloating, Bloody Stool, Change in Bowel Habits, Chronic diarrhea, Constipation, Difficulty Swallowing, Excessive gas, Gets full quickly at meals, Hemorrhoids, Indigestion, Nausea, Rectal Pain and Vomiting. Female Genitourinary Not Present- Frequency, Nocturia, Painful Urination, Pelvic Pain and Urgency. Musculoskeletal Present- Back Pain. Not Present- Joint Pain, Joint Stiffness, Muscle Pain, Muscle Weakness and Swelling of Extremities. Neurological Present- Trouble walking. Not Present- Decreased Memory, Fainting, Headaches, Numbness, Seizures, Tingling, Tremor and Weakness. Psychiatric Not Present- Anxiety, Bipolar, Change in Sleep Pattern, Depression, Fearful and Frequent crying. Endocrine Not Present- Cold Intolerance, Excessive Hunger, Hair Changes, Heat Intolerance, Hot flashes and New Diabetes. Hematology Not Present- Blood Thinners, Easy Bruising, Excessive bleeding, Gland problems, HIV and Persistent Infections.   Physical Exam  General Mental Status-Alert. General Appearance-Consistent with stated age. Hydration-Well hydrated. Voice-Normal.  Head and Neck Head-normocephalic, atraumatic with no lesions or palpable masses. Trachea-midline. Thyroid Gland Characteristics - normal size and consistency.  Eye Eyeball - Bilateral-Extraocular movements intact. Sclera/Conjunctiva - Bilateral-No scleral icterus.  Chest and Lung Exam Chest and lung exam reveals -quiet, even and easy respiratory effort with no use of accessory muscles and on auscultation, normal breath sounds, no adventitious sounds and normal vocal resonance. Inspection Chest Wall - Normal. Back -  normal.  Breast Note: There is a moderate palpable bruise in the UIQ of the left  breast. there is no palpable axillary, supraclavicular, or cervical lymphadenopathy   Cardiovascular Cardiovascular examination reveals -normal heart sounds, regular rate and rhythm with no murmurs and normal pedal pulses bilaterally.  Abdomen Inspection Inspection of the abdomen reveals - No Hernias. Skin - Scar - no surgical scars. Palpation/Percussion Palpation and Percussion of the abdomen reveal - Soft, Non Tender, No Rebound tenderness, No Rigidity (guarding) and No hepatosplenomegaly. Auscultation Auscultation of the abdomen reveals - Bowel sounds normal.  Neurologic Neurologic evaluation reveals -alert and oriented x 3 with no impairment of recent or remote memory. Mental Status-Normal.  Musculoskeletal Normal Exam - Left-Upper Extremity Strength Normal and Lower Extremity Strength Normal. Normal Exam - Right-Upper Extremity Strength Normal and Lower Extremity Strength Normal.  Lymphatic Head & Neck  General Head & Neck Lymphatics: Bilateral - Description - Normal. Axillary  General Axillary Region: Bilateral - Description - Normal. Tenderness - Non Tender. Femoral & Inguinal  Generalized Femoral & Inguinal Lymphatics: Bilateral - Description - Normal. Tenderness - Non Tender.    Assessment & Plan  DUCTAL CARCINOMA IN SITU (DCIS) OF LEFT BREAST (D05.12) Impression: The patient appears to have a small area of dcis in the UIQ of the left breast. We have discussed the different options for treatment and at this point she favors breast conservation which I feel is a very reasonable way of treating her cancer. She will not need a node evaluation. I have discussed with her in detail the risks and benefits of the surgery as well as some of the technical aspects including the use of a radioactive seed for localization and she understands and wishes to proceed. She will also meet with  med and rad onc to discuss adjuvant therapy. This patient encounter took 60 minutes today to perform the following: take history, perform exam, review outside records, interpret imaging, counsel the patient on their diagnosis and document encounter, findings & plan in the EHR Current Plans Referred to Oncology, for evaluation and follow up (Oncology). Routine.

## 2020-06-18 ENCOUNTER — Encounter (HOSPITAL_BASED_OUTPATIENT_CLINIC_OR_DEPARTMENT_OTHER): Payer: Self-pay | Admitting: General Surgery

## 2020-06-19 LAB — SURGICAL PATHOLOGY

## 2020-06-22 ENCOUNTER — Encounter: Payer: Self-pay | Admitting: *Deleted

## 2020-07-13 ENCOUNTER — Emergency Department (HOSPITAL_BASED_OUTPATIENT_CLINIC_OR_DEPARTMENT_OTHER)
Admission: EM | Admit: 2020-07-13 | Discharge: 2020-07-13 | Disposition: A | Discharge status: Home / Self Care | Payer: Medicare HMO | Attending: Emergency Medicine | Admitting: Emergency Medicine

## 2020-07-13 ENCOUNTER — Encounter (HOSPITAL_BASED_OUTPATIENT_CLINIC_OR_DEPARTMENT_OTHER): Payer: Self-pay | Admitting: Emergency Medicine

## 2020-07-13 ENCOUNTER — Emergency Department (HOSPITAL_BASED_OUTPATIENT_CLINIC_OR_DEPARTMENT_OTHER): Payer: Medicare HMO

## 2020-07-13 ENCOUNTER — Other Ambulatory Visit: Payer: Self-pay

## 2020-07-13 ENCOUNTER — Emergency Department (HOSPITAL_BASED_OUTPATIENT_CLINIC_OR_DEPARTMENT_OTHER): Payer: Medicare HMO | Admitting: Radiology

## 2020-07-13 DIAGNOSIS — N182 Chronic kidney disease, stage 2 (mild): Secondary | ICD-10-CM | POA: Diagnosis not present

## 2020-07-13 DIAGNOSIS — J45909 Unspecified asthma, uncomplicated: Secondary | ICD-10-CM | POA: Insufficient documentation

## 2020-07-13 DIAGNOSIS — E1122 Type 2 diabetes mellitus with diabetic chronic kidney disease: Secondary | ICD-10-CM | POA: Insufficient documentation

## 2020-07-13 DIAGNOSIS — R0789 Other chest pain: Secondary | ICD-10-CM | POA: Insufficient documentation

## 2020-07-13 DIAGNOSIS — R1010 Upper abdominal pain, unspecified: Secondary | ICD-10-CM | POA: Diagnosis not present

## 2020-07-13 DIAGNOSIS — Z79899 Other long term (current) drug therapy: Secondary | ICD-10-CM | POA: Diagnosis not present

## 2020-07-13 DIAGNOSIS — R112 Nausea with vomiting, unspecified: Secondary | ICD-10-CM | POA: Insufficient documentation

## 2020-07-13 DIAGNOSIS — I129 Hypertensive chronic kidney disease with stage 1 through stage 4 chronic kidney disease, or unspecified chronic kidney disease: Secondary | ICD-10-CM | POA: Diagnosis not present

## 2020-07-13 DIAGNOSIS — Z7982 Long term (current) use of aspirin: Secondary | ICD-10-CM | POA: Insufficient documentation

## 2020-07-13 DIAGNOSIS — R7401 Elevation of levels of liver transaminase levels: Secondary | ICD-10-CM | POA: Insufficient documentation

## 2020-07-13 DIAGNOSIS — R7989 Other specified abnormal findings of blood chemistry: Secondary | ICD-10-CM

## 2020-07-13 LAB — BASIC METABOLIC PANEL
Anion gap: 6 (ref 5–15)
BUN: 23 mg/dL (ref 8–23)
CO2: 30 mmol/L (ref 22–32)
Calcium: 9.2 mg/dL (ref 8.9–10.3)
Chloride: 100 mmol/L (ref 98–111)
Creatinine, Ser: 0.82 mg/dL (ref 0.44–1.00)
GFR, Estimated: >60 mL/min (ref >60)
Glucose, Bld: 114 mg/dL — ABNORMAL HIGH (ref 70–99)
Potassium: 3.9 mmol/L (ref 3.5–5.1)
Sodium: 136 mmol/L (ref 135–145)

## 2020-07-13 LAB — CBC
HCT: 33.6 % — ABNORMAL LOW (ref 36.0–46.0)
Hemoglobin: 11.2 g/dL — ABNORMAL LOW (ref 12.0–15.0)
MCH: 31.2 pg (ref 26.0–34.0)
MCHC: 33.3 g/dL (ref 30.0–36.0)
MCV: 93.6 fL (ref 80.0–100.0)
Platelets: 248 10*3/uL (ref 150–400)
RBC: 3.59 MIL/uL — ABNORMAL LOW (ref 3.87–5.11)
RDW: 14.6 % (ref 11.5–15.5)
WBC: 4.8 10*3/uL (ref 4.0–10.5)
nRBC: 0 % (ref 0.0–0.2)

## 2020-07-13 LAB — LIPASE, BLOOD: Lipase: 18 U/L (ref 11–51)

## 2020-07-13 LAB — HEPATIC FUNCTION PANEL
ALT: 233 U/L — ABNORMAL HIGH (ref 0–44)
AST: 304 U/L — ABNORMAL HIGH (ref 15–41)
Albumin: 3.7 g/dL (ref 3.5–5.0)
Alkaline Phosphatase: 227 U/L — ABNORMAL HIGH (ref 38–126)
Bilirubin, Direct: 0.1 mg/dL (ref 0.0–0.2)
Indirect Bilirubin: 0.3 mg/dL (ref 0.3–0.9)
Total Bilirubin: 0.4 mg/dL (ref 0.3–1.2)
Total Protein: 7.4 g/dL (ref 6.5–8.1)

## 2020-07-13 LAB — PROTIME-INR
INR: 1 (ref 0.8–1.2)
Prothrombin Time: 12.8 seconds (ref 11.4–15.2)

## 2020-07-13 LAB — TROPONIN I (HIGH SENSITIVITY)
Troponin I (High Sensitivity): 13 ng/L (ref <18)
Troponin I (High Sensitivity): 16 ng/L (ref <18)

## 2020-07-13 MED ORDER — IOHEXOL 300 MG/ML  SOLN
100.0000 mL | Freq: Once | INTRAMUSCULAR | Status: AC | PRN
  Administered 2020-07-13: 100 mL via INTRAVENOUS

## 2020-07-13 NOTE — ED Triage Notes (Signed)
Intermittent chest tightness with vomiting once last week and then last night.

## 2020-07-13 NOTE — Discharge Instructions (Addendum)
Follow-up with your primary doctor and your gastroenterologist.  You should have your liver function tests repeated within the next few days or 1 week.  If you have worsening abdominal pain, vomiting, you should come immediately back to ER for reassessment.  Discussed your elevation in the liver function test with your oncologist as well.  She could also order the repeat blood work.

## 2020-07-13 NOTE — ED Provider Notes (Signed)
[facility] EMERGENCY DEPT Provider Note   CSN: [id number] Arrival date & time: 07/13/20  1649     History Chief Complaint  Patient presents with  . Chest Pain    HAELEE BOLEN is a 87 y.o. female.  Presented to ER with multiple complaints.  Patient reports last week she had an episode of feeling nauseated, vomited.  States that symptoms lasted for a few hours and then spontaneously went away.  She had an episode again last night where she felt nauseated had some pain in her lower chest/upper abdomen.  States that today she actually has not had any symptoms and has no symptoms at present.  HPI     Past Medical History:  Diagnosis Date  . Abnormal EKG   . Asthma   . Carotid artery occlusion   . DDD (degenerative disc disease)   . Diabetes mellitus without complication ([place])   . Family history of breast cancer   . Family history of GI tract cancer   . Family history of prostate cancer   . Hyperlipidemia   . Hypertension   . PAD (peripheral artery disease) ([place])   . Right lower quadrant abdominal abscess ([place])   . SBO (small bowel obstruction) ([place]) 03/15/2014  . Spinal stenosis   . Type II or unspecified type diabetes mellitus without mention of complication, not stated as uncontrolled 03/22/2013  . Vitamin D deficiency     Patient Active Problem List   Diagnosis Date Noted  . Genetic testing 06/10/2020  . Family history of breast cancer   . Family history of prostate cancer   . Family history of GI tract cancer   . Ductal carcinoma in situ (DCIS) of left breast 05/18/2020  . Hepatitis   . Pain   . Biliary obstruction 12/31/2017  . Elevated liver enzymes 12/31/2017  . Acute urinary retention 11/16/2014  . Recurrent appendicitis s/p lap appy 11/15/2014 11/15/2014  . Hypomagnesemia 03/24/2014  . Malnutrition of moderate degree ([place]) 03/19/2014  . Nausea and vomiting   . Hypokalemia   . Diabetes mellitus type 2, controlled ([place]) 02/27/2014  . CKD stage  2 due to type 2 diabetes mellitus ([place]) 12/25/2013  . Toe fracture, right 11/28/2013  . Encounter for long-term (current) use of medications 09/17/2013  . Essential hypertension 03/22/2013  . Hyperlipidemia 03/22/2013  . Vitamin D deficiency 03/22/2013  . Extrinsic asthma, unspecified 03/22/2013    Past Surgical History:  Procedure Laterality Date  . BREAST LUMPECTOMY WITH RADIOACTIVE SEED LOCALIZATION Left 06/17/2020   Procedure: LEFT BREAST LUMPECTOMY WITH RADIOACTIVE SEED LOCALIZATION;  Surgeon: Jovita Kussmaul, MD;  Location: [facility];  Service: General;  Laterality: Left;  . CHOLECYSTECTOMY    . CYST EXCISION  back  . HERNIA REPAIR    . LAPAROSCOPIC APPENDECTOMY N/A 11/15/2014   Procedure: APPENDECTOMY LAPAROSCOPIC;  Surgeon: Michael Boston, MD;  Location: [facility];  Service: General;  Laterality: N/A;  . MINI-LAPAROTOMY W/ TUBAL LIGATION  1964     OB History   No obstetric history on file.     Family History  Problem Relation Age of Onset  . Heart attack Mother   . Hypertension Mother   . Heart attack Father   . Stroke Sister   . Breast cancer Sister   . Prostate cancer Brother   . Heart attack Brother        x 2  . Breast cancer Sister   . Colon cancer Sister   . Breast cancer  Sister   . Cancer Maternal Grandmother        Gastrointestinal, dx >50  . Stroke Paternal Grandmother   . Stroke Paternal Grandfather   . Breast cancer Cousin        maternal first cousin    Social History   Tobacco Use  . Smoking status: Never Smoker  . Smokeless tobacco: Never Used  Vaping Use  . Vaping Use: Never used  Substance Use Topics  . Alcohol use: No    Alcohol/week: 0.0 standard drinks  . Drug use: No    Home Medications Prior to Admission medications   Medication Sig Start Date End Date Taking? Authorizing Provider  amLODipine (NORVASC) 10 MG tablet Take 10 mg by mouth daily. 11/08/17   [provider]  aspirin EC 81 MG tablet Take 81 mg by  mouth daily.    [provider]  atenolol (TENORMIN) 100 MG tablet TAKE ONE TABLET BY MOUTH DAILY *PLEASE SCHEDULE F/U APPOINTMENT TO OBTAIN FURTHER REFILLS* 01/27/20   Patwardhan, Reynold Bowen, MD  Cholecalciferol (VITAMIN D-3) 1000 UNITS CAPS Take 2,000 Units by mouth daily.     [provider]  cilostazol (PLETAL) 50 MG tablet TAKE ONE TABLET BY MOUTH TWICE A DAY 05/28/19   Patwardhan, Manish J, MD  Coenzyme Q10 200 MG capsule Take 200 mg by mouth daily.    [provider]  Cyanocobalamin (VITAMIN B 12 PO) Take 500 mcg by mouth daily.    [provider]  diltiazem (CARDIZEM) 120 MG tablet TAKE ONE TABLET BY MOUTH DAILY 05/17/19   Patwardhan, Manish J, MD  Flaxseed, Linseed, (FLAXSEED OIL) 1000 MG CAPS Take 1,000 mg by mouth 4 (four) times daily.    [provider]  HYDROcodone-acetaminophen (NORCO/VICODIN) 5-325 MG tablet Take 1-2 tablets by mouth every 6 (six) hours as needed for moderate pain or severe pain. 06/17/20   [name], MD  Lancets (FREESTYLE) lancets Test glucose 1 time daily 08/16/13   Unk Pinto, MD  Magnesium Oxide (MAG-OX PO) Take 1 tablet by mouth daily.     [provider]  Multiple Vitamin (MULTIVITAMIN ADULT PO) Take 1 tablet by mouth daily at 6 (six) AM.    [provider]  oxybutynin (DITROPAN) 5 MG tablet Take 5 mg by mouth 2 (two) times daily. 12/22/17   [provider]  rosuvastatin (CRESTOR) 20 MG tablet Take 1 tablet (20 mg total) by mouth daily. 05/02/18   Patwardhan, Reynold Bowen, MD  valsartan-hydrochlorothiazide (DIOVAN-HCT) 160-25 MG tablet TAKE ONE TABLET BY MOUTH DAILY 05/08/19   Patwardhan, Reynold Bowen, MD  vitamin C (ASCORBIC ACID) 250 MG tablet Take 250 mg by mouth daily.    [provider]    Allergies    Ace inhibitors, Metformin and related, Penicillins, Grapefruit extract, and Minoxidil  Review of Systems   Review of Systems  Constitutional: Negative for chills and fever.   HENT: Negative for ear pain and sore throat.   Eyes: Negative for pain and visual disturbance.  Respiratory: Negative for cough and shortness of breath.   Cardiovascular: Negative for chest pain and palpitations.  Gastrointestinal: Positive for abdominal pain, nausea and vomiting.  Genitourinary: Negative for dysuria and hematuria.  Musculoskeletal: Negative for arthralgias and back pain.  Skin: Negative for color change and rash.  Neurological: Negative for seizures and syncope.  All other systems reviewed and are negative.   Physical Exam Updated Vital Signs BP (!) 151/83   Pulse 77   Temp 98.6  F (37 C) (Oral)   Resp 14   Ht 5\' 6"  (1.676 m)   Wt 72.6 kg   SpO2 100%   BMI 25.82 kg/m   Physical Exam Vitals and nursing note reviewed.  Constitutional:      General: She is not in acute distress.    Appearance: She is well-developed.  HENT:     Head: Normocephalic and atraumatic.  Eyes:     Conjunctiva/sclera: Conjunctivae normal.  Cardiovascular:     Rate and Rhythm: Normal rate and regular rhythm.     Heart sounds: No murmur heard.   Pulmonary:     Effort: Pulmonary effort is normal. No respiratory distress.     Breath sounds: Normal breath sounds.  Abdominal:     Palpations: Abdomen is soft.     Tenderness: There is no abdominal tenderness.  Musculoskeletal:     Cervical back: Neck supple.     Right lower leg: No edema.     Left lower leg: No edema.  Skin:    General: Skin is warm and dry.  Neurological:     General: No focal deficit present.     Mental Status: She is alert.     ED Results / Procedures / Treatments   Labs (all labs ordered are listed, but only abnormal results are displayed) Labs Reviewed  BASIC METABOLIC PANEL - Abnormal; Notable for the following components:      Result Value   Glucose, Bld 114 (*)    All other components within normal limits  CBC - Abnormal; Notable for the following components:   RBC 3.59 (*)    Hemoglobin 11.2  (*)    HCT 33.6 (*)    All other components within normal limits  HEPATIC FUNCTION PANEL - Abnormal; Notable for the following components:   AST 304 (*)    ALT 233 (*)    Alkaline Phosphatase 227 (*)    All other components within normal limits  LIPASE, BLOOD  PROTIME-INR  HEPATITIS PANEL, ACUTE  TROPONIN I (HIGH SENSITIVITY)  TROPONIN I (HIGH SENSITIVITY)    EKG EKG Interpretation  Date/Time:  Monday July 13 2020 17:09:10 EDT Ventricular Rate:  77 PR Interval:  150 QRS Duration: 78 QT Interval:  394 QTC Calculation: 445 R Axis:   35 Text Interpretation: Normal sinus rhythm Normal ECG Confirmed by Madalyn Rob [id number]) on 07/13/2020 5:11:35 PM   Radiology DG Chest 2 View  Result Date: 07/13/2020 CLINICAL DATA:  Chest pain and tightness. EXAM: CHEST - 2 VIEW COMPARISON:  02/27/2010 FINDINGS: The cardiomediastinal contours are normal. Again seen mild biapical pleuroparenchymal scarring. Pulmonary vasculature is normal. No consolidation, pleural effusion, or pneumothorax. No acute osseous abnormalities are seen. IMPRESSION: No acute chest findings. Electronically Signed   By: Keith Rake M.D.   On: 07/13/2020 18:22   CT ABDOMEN PELVIS W CONTRAST  Result Date: 07/13/2020 CLINICAL DATA:  87 year old female with abdominal pain. EXAM: CT ABDOMEN AND PELVIS WITH CONTRAST TECHNIQUE: Multidetector CT imaging of the abdomen and pelvis was performed using the standard protocol following bolus administration of intravenous contrast. CONTRAST:  131mL OMNIPAQUE IOHEXOL 300 MG/ML  SOLN COMPARISON:  CT abdomen pelvis dated 12/31/2017. FINDINGS: Lower chest: Right lower lobe bronchiectasis. The visualized lung bases are otherwise clear. There is coronary vascular calcification. No intra-abdominal free air or free fluid. Hepatobiliary: The liver is unremarkable. There is intrahepatic and extrahepatic biliary ductal dilatation, likely post cholecystectomy. The common bile duct measures  approximately 2 cm in diameter. No  retained calcified stone noted in the central CBD. Pancreas: Unremarkable. No pancreatic ductal dilatation or surrounding inflammatory changes. Spleen: Normal in size without focal abnormality. Adrenals/Urinary Tract: The adrenal glands unremarkable. There is no hydronephrosis on either side. Mild fullness of the right renal pelvis. Small right renal hypodense lesions are not characterized. There is symmetric enhancement and excretion of contrast by both kidneys. The visualized ureters and urinary bladder appear unremarkable. Stomach/Bowel: There is moderate stool throughout the colon. There is sigmoid diverticulosis without active inflammatory changes. There is no bowel obstruction or active inflammation. Appendectomy. Vascular/Lymphatic: Moderate aortoiliac atherosclerotic disease. The IVC is unremarkable. No portal venous gas. There is no adenopathy. Reproductive: The uterus and ovaries are grossly unremarkable. Mildly dilated left gonadal vein. Other: None Musculoskeletal: Osteopenia with degenerative changes of the spine. No acute osseous pathology. IMPRESSION: 1. No acute intra-abdominal or pelvic pathology. 2. Sigmoid diverticulosis. No bowel obstruction. 3. Aortic Atherosclerosis (ICD10-I70.0). Electronically Signed   By: Anner Crete M.D.   On: 07/13/2020 21:12    Procedures Procedures   Medications Ordered in ED Medications  iohexol (OMNIPAQUE) 300 MG/ML solution 100 mL (100 mLs Intravenous Contrast Given 07/13/20 2054)    ED Course  I have reviewed the triage vital signs and the nursing notes.  Pertinent labs & imaging results that were available during my care of the patient were reviewed by me and considered in my medical decision making (see chart for details).    MDM Rules/Calculators/A&P                         87 year old lady presenting to ER after she had couple episodes of nausea and some upper abdominal/lower chest pain yesterday.  No  symptoms at present and well-appearing with stable vitals.  Basic labs for GI and cardiac were obtained.  No acute ischemic change on EKG and troponin x2 within normal limits, doubt ACS.  LFTs were elevated.  Obtain CT scan to further assess.  No acute findings identified.  Given the elevated LFTs, reviewed with on-call for gastroenterology.  When asked, patient denied having GI physician so paged unassigned.  Reviewed case with Dr.Schooler, recommends outpatient management, repeat labs with PCP.  Dr. Michail Sermon informed me that patient is a [facility] patient, last seen in clinic in 2016 and recommends following up with [facility].  Instructed patient to return for recurrence of symptoms and to have either her primary doctor, GI or oncology repeat labs.  On reassessment, she remains asymptomatic and well-appearing, which she is stable for discharge and outpatient monitoring of her mild elevation in LFTs.    After the discussed management above, the patient was determined to be safe for discharge.  The patient was in agreement with this plan and all questions regarding their care were answered.  ED return precautions were discussed and the patient will return to the ED with any significant worsening of condition.      Final Clinical Impression(s) / ED Diagnoses Final diagnoses:  Elevated LFTs    Rx / DC Orders ED Discharge Orders    None       Lucrezia Starch, MD 07/13/20 2341

## 2020-07-13 NOTE — ED Notes (Signed)
Pt transported to CT ?

## 2020-07-14 ENCOUNTER — Telehealth: Payer: Self-pay

## 2020-07-14 LAB — HEPATITIS PANEL, ACUTE
HCV Ab: NONREACTIVE
Hep A IgM: NONREACTIVE
Hep B C IgM: NONREACTIVE
Hepatitis B Surface Ag: NONREACTIVE

## 2020-07-14 NOTE — Telephone Encounter (Signed)
Armbruster, Carlota Raspberry, MD  Yevette Edwards, RN Can you help refer to the schedulers to coordinate follow up for this patient? Thanks    Message sent to schedulers to set up follow up appt.

## 2020-07-15 ENCOUNTER — Ambulatory Visit: Payer: Medicare HMO | Admitting: Radiation Oncology

## 2020-07-15 ENCOUNTER — Ambulatory Visit
Admission: RE | Admit: 2020-07-15 | Discharge: 2020-07-15 | Disposition: A | Discharge status: Home / Self Care | Payer: Medicare HMO | Source: Ambulatory Visit | Attending: Radiation Oncology | Admitting: Radiation Oncology

## 2020-07-15 ENCOUNTER — Other Ambulatory Visit: Payer: Self-pay

## 2020-07-15 ENCOUNTER — Encounter: Payer: Self-pay | Admitting: Radiation Oncology

## 2020-07-15 VITALS — HR 77 | Temp 96.8°F | Resp 20 | Ht 66.0 in | Wt 161.1 lb

## 2020-07-15 DIAGNOSIS — E559 Vitamin D deficiency, unspecified: Secondary | ICD-10-CM | POA: Insufficient documentation

## 2020-07-15 DIAGNOSIS — I10 Essential (primary) hypertension: Secondary | ICD-10-CM | POA: Insufficient documentation

## 2020-07-15 DIAGNOSIS — K5730 Diverticulosis of large intestine without perforation or abscess without bleeding: Secondary | ICD-10-CM | POA: Insufficient documentation

## 2020-07-15 DIAGNOSIS — I700 Atherosclerosis of aorta: Secondary | ICD-10-CM | POA: Insufficient documentation

## 2020-07-15 DIAGNOSIS — Z79899 Other long term (current) drug therapy: Secondary | ICD-10-CM | POA: Insufficient documentation

## 2020-07-15 DIAGNOSIS — E119 Type 2 diabetes mellitus without complications: Secondary | ICD-10-CM | POA: Insufficient documentation

## 2020-07-15 DIAGNOSIS — Z803 Family history of malignant neoplasm of breast: Secondary | ICD-10-CM | POA: Insufficient documentation

## 2020-07-15 DIAGNOSIS — D0512 Intraductal carcinoma in situ of left breast: Secondary | ICD-10-CM | POA: Diagnosis present

## 2020-07-15 DIAGNOSIS — M8580 Other specified disorders of bone density and structure, unspecified site: Secondary | ICD-10-CM | POA: Diagnosis not present

## 2020-07-15 DIAGNOSIS — E785 Hyperlipidemia, unspecified: Secondary | ICD-10-CM | POA: Diagnosis not present

## 2020-07-15 DIAGNOSIS — Z7982 Long term (current) use of aspirin: Secondary | ICD-10-CM | POA: Diagnosis not present

## 2020-07-15 DIAGNOSIS — K838 Other specified diseases of biliary tract: Secondary | ICD-10-CM | POA: Diagnosis not present

## 2020-07-15 DIAGNOSIS — Z170 Estrogen receptor positive status [ER+]: Secondary | ICD-10-CM | POA: Insufficient documentation

## 2020-07-15 DIAGNOSIS — J479 Bronchiectasis, uncomplicated: Secondary | ICD-10-CM | POA: Diagnosis not present

## 2020-07-15 DIAGNOSIS — Z800 Family history of malignant neoplasm of digestive organs: Secondary | ICD-10-CM | POA: Insufficient documentation

## 2020-07-15 NOTE — Progress Notes (Signed)
Radiation Oncology         [phone] ________________________________  Name: Kristine Burnett        MRN: [id number]  Date of Service: 07/15/2020 DOB: 1929-09-26  BT:[id number], Kristine Millin, MD  Truitt Merle, MD     REFERRING PHYSICIAN: Truitt Merle, MD   DIAGNOSIS: The encounter diagnosis was Ductal carcinoma in situ (DCIS) of left breast.   HISTORY OF PRESENT ILLNESS: Kristine Burnett is a 87 y.o. female originally seen in the multidisciplinary breast clinic for a new diagnosis of left breast cancer. The patient was noted to have screening detected calcifications in the left breast. She was found ot have a 1.1 cm group of calcifications that was described as mass like and at 9:30 position and her axilla was negative for adenopathy. A biopsy on 05/13/20 revealed a high grade DCIS with associated necrosis and calcifications, suspicious for microinvasion. Her cancer was ER/PR positive.   Since her last visit, the patient has undergone left lumpectomy on 06/17/2020 which revealed a focus of microinvasive invasive carcinoma less than 1 mm arising in association with high-grade DCIS, her margins were negative and the superior margin is 2 mm from the focus of microinvasion which is the closest site.  Additional fibrocystic change was identified.  Given these findings she is seen to discuss adjuvant treatment.   PREVIOUS RADIATION THERAPY: No   PAST MEDICAL HISTORY:  Past Medical History:  Diagnosis Date  . Abnormal EKG   . Asthma   . Carotid artery occlusion   . DDD (degenerative disc disease)   . Diabetes mellitus without complication ([place])   . Family history of breast cancer   . Family history of GI tract cancer   . Family history of prostate cancer   . Hyperlipidemia   . Hypertension   . PAD (peripheral artery disease) ([place])   . Right lower quadrant abdominal abscess ([place])   . SBO (small bowel obstruction) ([place]) 03/15/2014  . Spinal stenosis   . Type II or unspecified type diabetes mellitus  without mention of complication, not stated as uncontrolled 03/22/2013  . Vitamin D deficiency        PAST SURGICAL HISTORY: Past Surgical History:  Procedure Laterality Date  . BREAST LUMPECTOMY WITH RADIOACTIVE SEED LOCALIZATION Left 06/17/2020   Procedure: LEFT BREAST LUMPECTOMY WITH RADIOACTIVE SEED LOCALIZATION;  Surgeon: Jovita Kussmaul, MD;  Location: [facility];  Service: General;  Laterality: Left;  . CHOLECYSTECTOMY    . CYST EXCISION  back  . HERNIA REPAIR    . LAPAROSCOPIC APPENDECTOMY N/A 11/15/2014   Procedure: APPENDECTOMY LAPAROSCOPIC;  Surgeon: Michael Boston, MD;  Location: [facility];  Service: General;  Laterality: N/A;  . MINI-LAPAROTOMY W/ TUBAL LIGATION  1964     FAMILY HISTORY:  Family History  Problem Relation Age of Onset  . Heart attack Mother   . Hypertension Mother   . Heart attack Father   . Stroke Sister   . Breast cancer Sister   . Prostate cancer Brother   . Heart attack Brother        x 2  . Breast cancer Sister   . Colon cancer Sister   . Breast cancer Sister   . Cancer Maternal Grandmother        Gastrointestinal, dx >50  . Stroke Paternal Grandmother   . Stroke Paternal Grandfather   . Breast cancer Cousin        maternal first cousin     SOCIAL HISTORY:  reports  that she has never smoked. She has never used smokeless tobacco. She reports that she does not drink alcohol and does not use drugs. The patient is widowed and lives in [place]. She's accompanied by her daughter [name].   ALLERGIES: Ace inhibitors, Metformin and related, Penicillins, Grapefruit extract, and Minoxidil   MEDICATIONS:  Current Outpatient Medications  Medication Sig Dispense Refill  . amLODipine (NORVASC) 10 MG tablet Take 10 mg by mouth daily.    Marland Kitchen aspirin EC 81 MG tablet Take 81 mg by mouth daily.    Marland Kitchen atenolol (TENORMIN) 100 MG tablet TAKE ONE TABLET BY MOUTH DAILY *PLEASE SCHEDULE F/U APPOINTMENT TO OBTAIN FURTHER REFILLS* 30 tablet 0  .  Cholecalciferol (VITAMIN D-3) 1000 UNITS CAPS Take 2,000 Units by mouth daily.     . cilostazol (PLETAL) 50 MG tablet TAKE ONE TABLET BY MOUTH TWICE A DAY 180 tablet 0  . Coenzyme Q10 200 MG capsule Take 200 mg by mouth daily.    . Cyanocobalamin (VITAMIN B 12 PO) Take 500 mcg by mouth daily.    Marland Kitchen diltiazem (CARDIZEM) 120 MG tablet TAKE ONE TABLET BY MOUTH DAILY 90 tablet 1  . Flaxseed, Linseed, (FLAXSEED OIL) 1000 MG CAPS Take 1,000 mg by mouth 4 (four) times daily.    Marland Kitchen HYDROcodone-acetaminophen (NORCO/VICODIN) 5-325 MG tablet Take 1-2 tablets by mouth every 6 (six) hours as needed for moderate pain or severe pain. 10 tablet 0  . Lancets (FREESTYLE) lancets Test glucose 1 time daily 100 each 1  . Magnesium Oxide (MAG-OX PO) Take 1 tablet by mouth daily.     . Multiple Vitamin (MULTIVITAMIN ADULT PO) Take 1 tablet by mouth daily at 6 (six) AM.    . oxybutynin (DITROPAN) 5 MG tablet Take 5 mg by mouth 2 (two) times daily.    . rosuvastatin (CRESTOR) 20 MG tablet Take 1 tablet (20 mg total) by mouth daily. 90 tablet 3  . valsartan-hydrochlorothiazide (DIOVAN-HCT) 160-25 MG tablet TAKE ONE TABLET BY MOUTH DAILY 90 tablet 1  . vitamin C (ASCORBIC ACID) 250 MG tablet Take 250 mg by mouth daily.     No current facility-administered medications for this encounter.     REVIEW OF SYSTEMS: On review of systems, the patient reports that she is doing well overall. She denies any concerns with her healing at this time.   PHYSICAL EXAM:  Wt Readings from Last 3 Encounters:  07/13/20 160 lb (72.6 kg)  06/17/20 161 lb 9.6 oz (73.3 kg)  05/20/20 162 lb 6.4 oz (73.7 kg)   Temp Readings from Last 3 Encounters:  07/13/20 98.6 F (37 C) (Oral)  06/17/20 97.9 F (36.6 C)  05/20/20 (!) 97.3 F (36.3 C) (Tympanic)   BP Readings from Last 3 Encounters:  07/13/20 (!) 151/83  06/17/20 (!) 145/72  05/20/20 (!) 154/54   Pulse Readings from Last 3 Encounters:  07/13/20 77  06/17/20 72  05/20/20 75     In general this is a well appearing African American female in no acute distress. She's alert and oriented x4 and appropriate throughout the examination. Cardiopulmonary assessment is negative for acute distress and she exhibits normal effort. Bilateral breast exam is deferred.    ECOG = 0  0 - Asymptomatic (Fully active, able to carry on all predisease activities without restriction)  1 - Symptomatic but completely ambulatory (Restricted in physically strenuous activity but ambulatory and able to carry out work of a light or sedentary nature. For example, light housework, office work)  2 - Symptomatic, <50% in bed during the day (Ambulatory and capable of all self care but unable to carry out any work activities. Up and about more than 50% of waking hours)  3 - Symptomatic, >50% in bed, but not bedbound (Capable of only limited self-care, confined to bed or chair 50% or more of waking hours)  4 - Bedbound (Completely disabled. Cannot carry on any self-care. Totally confined to bed or chair)  5 - Death   Eustace Pen MM, Creech RH, Tormey DC, et al. [phone]). "Toxicity and response criteria of the [facility]". [place] Oncol. 5 (6): 649-55    LABORATORY DATA:  Lab Results  Component Value Date   WBC 4.8 07/13/2020   HGB 11.2 (L) 07/13/2020   HCT 33.6 (L) 07/13/2020   MCV 93.6 07/13/2020   PLT 248 07/13/2020   Lab Results  Component Value Date   NA 136 07/13/2020   K 3.9 07/13/2020   CL 100 07/13/2020   CO2 30 07/13/2020   Lab Results  Component Value Date   ALT 233 (H) 07/13/2020   AST 304 (H) 07/13/2020   ALKPHOS 227 (H) 07/13/2020   BILITOT 0.4 07/13/2020      RADIOGRAPHY: DG Chest 2 View  Result Date: 07/13/2020 CLINICAL DATA:  Chest pain and tightness. EXAM: CHEST - 2 VIEW COMPARISON:  02/27/2010 FINDINGS: The cardiomediastinal contours are normal. Again seen mild biapical pleuroparenchymal scarring. Pulmonary vasculature is normal. No  consolidation, pleural effusion, or pneumothorax. No acute osseous abnormalities are seen. IMPRESSION: No acute chest findings. Electronically Signed   By: Keith Rake M.D.   On: 07/13/2020 18:22   CT ABDOMEN PELVIS W CONTRAST  Result Date: 07/13/2020 CLINICAL DATA:  87 year old female with abdominal pain. EXAM: CT ABDOMEN AND PELVIS WITH CONTRAST TECHNIQUE: Multidetector CT imaging of the abdomen and pelvis was performed using the standard protocol following bolus administration of intravenous contrast. CONTRAST:  153m OMNIPAQUE IOHEXOL 300 MG/ML  SOLN COMPARISON:  CT abdomen pelvis dated 12/31/2017. FINDINGS: Lower chest: Right lower lobe bronchiectasis. The visualized lung bases are otherwise clear. There is coronary vascular calcification. No intra-abdominal free air or free fluid. Hepatobiliary: The liver is unremarkable. There is intrahepatic and extrahepatic biliary ductal dilatation, likely post cholecystectomy. The common bile duct measures approximately 2 cm in diameter. No retained calcified stone noted in the central CBD. Pancreas: Unremarkable. No pancreatic ductal dilatation or surrounding inflammatory changes. Spleen: Normal in size without focal abnormality. Adrenals/Urinary Tract: The adrenal glands unremarkable. There is no hydronephrosis on either side. Mild fullness of the right renal pelvis. Small right renal hypodense lesions are not characterized. There is symmetric enhancement and excretion of contrast by both kidneys. The visualized ureters and urinary bladder appear unremarkable. Stomach/Bowel: There is moderate stool throughout the colon. There is sigmoid diverticulosis without active inflammatory changes. There is no bowel obstruction or active inflammation. Appendectomy. Vascular/Lymphatic: Moderate aortoiliac atherosclerotic disease. The IVC is unremarkable. No portal venous gas. There is no adenopathy. Reproductive: The uterus and ovaries are grossly unremarkable. Mildly  dilated left gonadal vein. Other: None Musculoskeletal: Osteopenia with degenerative changes of the spine. No acute osseous pathology. IMPRESSION: 1. No acute intra-abdominal or pelvic pathology. 2. Sigmoid diverticulosis. No bowel obstruction. 3. Aortic Atherosclerosis (ICD10-I70.0). Electronically Signed   By: AAnner CreteM.D.   On: 07/13/2020 21:12   MM Breast Surgical Specimen  Result Date: 06/17/2020 CLINICAL DATA:  Evaluate specimen EXAM: SPECIMEN RADIOGRAPH OF THE LEFT BREAST COMPARISON:  Previous exam(s). FINDINGS:  Status post excision of the left breast. The radioactive seed and biopsy marker clip are present, completely intact, and were marked for pathology. IMPRESSION: Specimen radiograph of the left breast. Electronically Signed   By: Dorise Bullion III M.D   On: 06/17/2020 10:22   MM LT RADIOACTIVE SEED LOC MAMMO GUIDE  Result Date: 06/16/2020 CLINICAL DATA:  Localization prior to surgery EXAM: MAMMOGRAPHIC GUIDED RADIOACTIVE SEED LOCALIZATION OF THE LEFT BREAST COMPARISON:  Previous exam(s). FINDINGS: Patient presents for radioactive seed localization prior to surgery. I met with the patient and we discussed the procedure of seed localization including benefits and alternatives. We discussed the high likelihood of a successful procedure. We discussed the risks of the procedure including infection, bleeding, tissue injury and further surgery. We discussed the low dose of radioactivity involved in the procedure. Informed, written consent was given. The usual time-out protocol was performed immediately prior to the procedure. Using mammographic guidance, sterile technique, 1% lidocaine and an I-125 radioactive seed, biopsy clip and remaining calcifications were localized using a medial approach. The follow-up mammogram images confirm the seed in the expected location and were marked for the surgeon. Follow-up survey of the patient confirms presence of the radioactive seed. Order number of  I-125 seed:  [id number]. Total activity:  0.626 millicuries reference Date: May 21, 2020 The patient tolerated the procedure well and was released from the [facility]. She was given instructions regarding seed removal. IMPRESSION: Radioactive seed localization left breast. No apparent complications. Electronically Signed   By: Dorise Bullion III M.D   On: 06/16/2020 14:22       IMPRESSION/PLAN: 1. High Grade ER/PR positive DCIS with  microinvasion of the left breast. Dr. Lisbeth Renshaw discusses the final pathology findings and reviews the nature of noninvasive left breast disease. She has been healing well. We discussed the role of external radiotherapy to the breast  to reduce risks of local recurrence followed by antiestrogen therapy, but reviewed again her margins and that radiotherapy may be optional. We discussed the risks, benefits, short, and long term effects of radiotherapy, as well as the curative intent, and the patient is interested in forgoing radiation. She will meet back with Dr. [facility] Medico to discuss antiestrogen.   In a visit lasting 45 minutes, greater than 50% of the time was spent face to face with Dr. Lisbeth Renshaw, and myself (via Webex) reviewing her case, as well as in preparation of, discussing, and coordinating the patient's care.  The above documentation reflects my direct findings during this shared patient visit. Please see the separate note by Dr. Lisbeth Renshaw on this date for the remainder of the patient's plan of care.    Carola Rhine, [facility]    **Disclaimer: This note was dictated with voice recognition software. Similar sounding words can inadvertently be transcribed and this note may contain transcription errors which may not have been corrected upon publication of note.**

## 2020-07-15 NOTE — Progress Notes (Addendum)
Patient denies having any pain today. Patient denies having any fatigue. Patient denies fever.  Patient states that she has a follow-up with the surgeon and he will re-evaluate her in 6 months.

## 2020-07-16 ENCOUNTER — Encounter: Payer: Self-pay | Admitting: *Deleted

## 2020-07-16 ENCOUNTER — Telehealth: Payer: Self-pay | Admitting: Hematology

## 2020-07-16 NOTE — Telephone Encounter (Signed)
Scheduled appt per 4/28 sch msg. Called pt, no answer. Left vm with appt date and time.

## 2020-07-21 ENCOUNTER — Telehealth: Payer: Self-pay | Admitting: Hematology

## 2020-07-21 ENCOUNTER — Encounter: Payer: Self-pay | Admitting: *Deleted

## 2020-07-21 ENCOUNTER — Inpatient Hospital Stay: Payer: Medicare HMO | Attending: Hematology | Admitting: Hematology

## 2020-07-21 DIAGNOSIS — M8580 Other specified disorders of bone density and structure, unspecified site: Secondary | ICD-10-CM | POA: Insufficient documentation

## 2020-07-21 DIAGNOSIS — Z79899 Other long term (current) drug therapy: Secondary | ICD-10-CM | POA: Insufficient documentation

## 2020-07-21 DIAGNOSIS — E785 Hyperlipidemia, unspecified: Secondary | ICD-10-CM | POA: Insufficient documentation

## 2020-07-21 DIAGNOSIS — I10 Essential (primary) hypertension: Secondary | ICD-10-CM | POA: Insufficient documentation

## 2020-07-21 DIAGNOSIS — Z170 Estrogen receptor positive status [ER+]: Secondary | ICD-10-CM | POA: Insufficient documentation

## 2020-07-21 DIAGNOSIS — Z803 Family history of malignant neoplasm of breast: Secondary | ICD-10-CM | POA: Insufficient documentation

## 2020-07-21 DIAGNOSIS — M4800 Spinal stenosis, site unspecified: Secondary | ICD-10-CM | POA: Insufficient documentation

## 2020-07-21 DIAGNOSIS — D0512 Intraductal carcinoma in situ of left breast: Secondary | ICD-10-CM | POA: Insufficient documentation

## 2020-07-21 DIAGNOSIS — Z79810 Long term (current) use of selective estrogen receptor modulators (SERMs): Secondary | ICD-10-CM | POA: Insufficient documentation

## 2020-07-21 DIAGNOSIS — R7303 Prediabetes: Secondary | ICD-10-CM | POA: Insufficient documentation

## 2020-07-21 NOTE — Progress Notes (Deleted)
[place]   [phone] [phone]   Clinic Follow up Note   Patient Care Team: Willey Blade, MD as PCP - General (Internal Medicine) Sharyne Peach, MD as Consulting Physician (Ophthalmology) Lafayette Dragon, MD (Inactive) as Consulting Physician (Gastroenterology) Armandina Gemma, MD as Consulting Physician (General Surgery) Rockwell Germany, RN as Oncology Nurse Navigator Mauro Kaufmann, RN as Oncology Nurse Navigator Jovita Kussmaul, MD as Consulting Physician (General Surgery) Truitt Merle, MD as Consulting Physician (Hematology) Kyung Rudd, MD as Consulting Physician (Radiation Oncology) 07/21/2020  CHIEF COMPLAINT:   SUMMARY OF ONCOLOGIC HISTORY: Oncology History Overview Note  Cancer Staging Ductal carcinoma in situ (DCIS) of left breast Staging form: Breast, AJCC 8th Edition - Clinical stage from 05/13/2020: Stage 0 (cTis (DCIS), cN0, cM0, G3, ER+, PR+, HER2: Not Assessed) - Signed by Truitt Merle, MD on 05/19/2020 Stage prefix: Initial diagnosis    Ductal carcinoma in situ (DCIS) of left breast  05/13/2020 Cancer Staging   Staging form: Breast, AJCC 8th Edition - Clinical stage from 05/13/2020: Stage 0 (cTis (DCIS), cN0, cM0, G3, ER+, PR+, HER2: Not Assessed) - Signed by Truitt Merle, MD on 05/19/2020 Stage prefix: Initial diagnosis   05/13/2020 Mammogram    IMPRESSION:  1.1 x 1.0 x 0.9 cm mass in the 9:30 o'clock position of the left breast 5 cm from the nipple with imaging features suspicious for malignancy.    05/13/2020 Initial Biopsy   Diagnosis Breast, left, needle core biopsy, 9:30 O'CLOCK left breast AMENDED DIAGNOSIS: - HIGH GRADE DUCTAL CARCINOMA IN SITU WITH CENTRAL NECROSIS AND CALCIFICATIONS. - FOCUS SUSPICIOUS FOR MICROINVASION. Microscopic Comment E-cadherin is positive supporting a ductal phenotype. In performing E-cadherin, subsequent leveling revealed a focus suspicious for microinvasion. Ancillary studies will be  reported separately. Revised results reported to [facility] on 05/15/2020. Dr. Tresa Moore reviewed.   05/13/2020 Receptors her2   Results: IMMUNOHISTOCHEMICAL AND MORPHOMETRIC ANALYSIS PERFORMED MANUALLY Estrogen Receptor: 95%, POSITIVE, STRONG STAINING INTENSITY Progesterone Receptor: 95%, POSITIVE, STRONG STAINING INTENSITY   05/18/2020 Initial Diagnosis   Ductal carcinoma in situ (DCIS) of left breast   06/10/2020 Genetic Testing   Negative genetic testing:  No pathogenic variants detected on the [employer] CancerNext-Expanded + RNAinsight panel. Two variants of uncertain significance (VUS) were detected - one in the BRCA1 gene called c.4735C>G (p.P1579A) and a second in the BRCA2 gene called c.1741T>C (p.S581P). The report date is 06/10/2020.  The CancerNext-Expanded + RNAinsight gene panel offered by [employer] and includes sequencing and rearrangement analysis for the following 77 genes: AIP, ALK, APC, ATM, AXIN2, BAP1, BARD1, BLM, BMPR1A, BRCA1, BRCA2, BRIP1, CDC73, CDH1, CDK4, CDKN1B, CDKN2A, CHEK2, CTNNA1, DICER1, FANCC, FH, FLCN, GALNT12, KIF1B, LZTR1, MAX, MEN1, MET, MLH1, MSH2, MSH3, MSH6, MUTYH, NBN, NF1, NF2, NTHL1, PALB2, PHOX2B, PMS2, POT1, PRKAR1A, PTCH1, PTEN, RAD51C, RAD51D, RB1, RECQL, RET, SDHA, SDHAF2, SDHB, SDHC, SDHD, SMAD4, SMARCA4, SMARCB1, SMARCE1, STK11, SUFU, TMEM127, TP53, TSC1, TSC2, VHL and XRCC2 (sequencing and deletion/duplication); EGFR, EGLN1, HOXB13, KIT, MITF, PDGFRA, POLD1 and POLE (sequencing only); EPCAM and GREM1 (deletion/duplication only). RNA data is routinely analyzed for use in variant interpretation for all genes.      CURRENT THERAPY:   INTERVAL HISTORY:   REVIEW OF SYSTEMS:   Constitutional: Denies fevers, chills or abnormal weight loss Eyes: Denies blurriness of vision Ears, nose, mouth, throat, and face: Denies mucositis or sore throat Respiratory: Denies cough, dyspnea or wheezes Cardiovascular: Denies palpitation, chest  discomfort or lower extremity swelling Gastrointestinal:  Denies  nausea, heartburn or change in bowel habits Skin: Denies abnormal skin rashes Lymphatics: Denies new lymphadenopathy or easy bruising Neurological:Denies numbness, tingling or new weaknesses Behavioral/Psych: Mood is stable, no new changes  All other systems were reviewed with the patient and are negative.  MEDICAL HISTORY:  Past Medical History:  Diagnosis Date  . Abnormal EKG   . Asthma   . Carotid artery occlusion   . DDD (degenerative disc disease)   . Diabetes mellitus without complication ([place])   . Family history of breast cancer   . Family history of GI tract cancer   . Family history of prostate cancer   . Hyperlipidemia   . Hypertension   . PAD (peripheral artery disease) ([place])   . Right lower quadrant abdominal abscess ([place])   . SBO (small bowel obstruction) ([place]) 03/15/2014  . Spinal stenosis   . Type II or unspecified type diabetes mellitus without mention of complication, not stated as uncontrolled 03/22/2013  . Vitamin D deficiency     SURGICAL HISTORY: Past Surgical History:  Procedure Laterality Date  . BREAST LUMPECTOMY WITH RADIOACTIVE SEED LOCALIZATION Left 06/17/2020   Procedure: LEFT BREAST LUMPECTOMY WITH RADIOACTIVE SEED LOCALIZATION;  Surgeon: Jovita Kussmaul, MD;  Location: [facility];  Service: General;  Laterality: Left;  . CHOLECYSTECTOMY    . CYST EXCISION  back  . HERNIA REPAIR    . LAPAROSCOPIC APPENDECTOMY N/A 11/15/2014   Procedure: APPENDECTOMY LAPAROSCOPIC;  Surgeon: Michael Boston, MD;  Location: [facility];  Service: General;  Laterality: N/A;  . MINI-LAPAROTOMY W/ TUBAL LIGATION  1964    I have reviewed the social history and family history with the patient and they are unchanged from previous note.  ALLERGIES:  is allergic to ace inhibitors, metformin and related, penicillins, grapefruit extract, and minoxidil.  MEDICATIONS:  Current Outpatient Medications   Medication Sig Dispense Refill  . amLODipine (NORVASC) 10 MG tablet Take 10 mg by mouth daily.    Marland Kitchen aspirin EC 81 MG tablet Take 81 mg by mouth daily.    Marland Kitchen atenolol (TENORMIN) 100 MG tablet TAKE ONE TABLET BY MOUTH DAILY *PLEASE SCHEDULE F/U APPOINTMENT TO OBTAIN FURTHER REFILLS* 30 tablet 0  . Cholecalciferol (VITAMIN D-3) 1000 UNITS CAPS Take 2,000 Units by mouth daily.     . cilostazol (PLETAL) 50 MG tablet TAKE ONE TABLET BY MOUTH TWICE A DAY 180 tablet 0  . Coenzyme Q10 200 MG capsule Take 200 mg by mouth daily.    . Cyanocobalamin (VITAMIN B 12 PO) Take 500 mcg by mouth daily.    Marland Kitchen diltiazem (CARDIZEM) 120 MG tablet TAKE ONE TABLET BY MOUTH DAILY 90 tablet 1  . Flaxseed, Linseed, (FLAXSEED OIL) 1000 MG CAPS Take 1,000 mg by mouth 4 (four) times daily.    Marland Kitchen HYDROcodone-acetaminophen (NORCO/VICODIN) 5-325 MG tablet Take 1-2 tablets by mouth every 6 (six) hours as needed for moderate pain or severe pain. 10 tablet 0  . Lancets (FREESTYLE) lancets Test glucose 1 time daily 100 each 1  . Magnesium Oxide (MAG-OX PO) Take 1 tablet by mouth daily.     . Multiple Vitamin (MULTIVITAMIN ADULT PO) Take 1 tablet by mouth daily at 6 (six) AM.    . oxybutynin (DITROPAN) 5 MG tablet Take 5 mg by mouth 2 (two) times daily.    . rosuvastatin (CRESTOR) 20 MG tablet Take 1 tablet (20 mg total) by mouth daily. 90 tablet 3  . valsartan-hydrochlorothiazide (DIOVAN-HCT) 160-25 MG tablet TAKE ONE TABLET BY MOUTH  DAILY 90 tablet 1  . vitamin C (ASCORBIC ACID) 250 MG tablet Take 250 mg by mouth daily.     No current facility-administered medications for this visit.    PHYSICAL EXAMINATION: ECOG PERFORMANCE STATUS: {CHL ONC ECOG PS:[phone]}  There were no vitals filed for this visit. There were no vitals filed for this visit.  GENERAL:alert, no distress and comfortable SKIN: skin color, texture, turgor are normal, no rashes or significant lesions EYES: normal, Conjunctiva are pink and non-injected,  sclera clear OROPHARYNX:no exudate, no erythema and lips, buccal mucosa, and tongue normal  NECK: supple, thyroid normal size, non-tender, without nodularity LYMPH:  no palpable lymphadenopathy in the cervical, axillary or inguinal LUNGS: clear to auscultation and percussion with normal breathing effort HEART: regular rate & rhythm and no murmurs and no lower extremity edema ABDOMEN:abdomen soft, non-tender and normal bowel sounds Musculoskeletal:no cyanosis of digits and no clubbing  NEURO: alert & oriented x 3 with fluent speech, no focal motor/sensory deficits  LABORATORY DATA:  I have reviewed the data as listed CBC Latest Ref Rng & Units 07/13/2020 05/20/2020 01/01/2018  WBC 4.0 - 10.5 K/uL 4.8 3.8(L) 13.6(H)  Hemoglobin 12.0 - 15.0 g/dL 11.2(L) 12.3 10.7(L)  Hematocrit 36.0 - 46.0 % 33.6(L) 36.8 32.9(L)  Platelets 150 - 400 K/uL 248 255 173     CMP Latest Ref Rng & Units 07/13/2020 06/12/2020 05/20/2020  Glucose 70 - 99 mg/dL 114(H) 144(H) 139(H)  BUN 8 - 23 mg/dL 23 28(H) 22  Creatinine 0.44 - 1.00 mg/dL 0.82 0.99 0.90  Sodium 135 - 145 mmol/L 136 134(L) 136  Potassium 3.5 - 5.1 mmol/L 3.9 4.3 4.5  Chloride 98 - 111 mmol/L 100 102 101  CO2 22 - 32 mmol/L '30 25 27  ' Calcium 8.9 - 10.3 mg/dL 9.2 9.4 10.0  Total Protein 6.5 - 8.1 g/dL 7.4 - 8.6(H)  Total Bilirubin 0.3 - 1.2 mg/dL 0.4 - 0.4  Alkaline Phos 38 - 126 U/L 227(H) - 66  AST 15 - 41 U/L 304(H) - 27  ALT 0 - 44 U/L 233(H) - 16      RADIOGRAPHIC STUDIES: I have personally reviewed the radiological images as listed and agreed with the findings in the report. No results found.   ASSESSMENT & PLAN:  KONNOR JORDEN is a 87 y.o. African American female with a history of Asthma, DDD, PAD, HTN., HLD, DM, Spinal stenosis   1. Left breast DCIS, High grade, ER+/PR+ -We discussed her image findings and the biopsy results in great details. She has a 1.1c mass in her left breast, high grade, with possible microinvasion, ER/PR  positive. There is suspicion for microinvasion.  -She is a candidate for breast conservation surgery. She has been seen by breast surgeon Dr. Marlou Starks, who recommends lumpectomy. -Given her strong family history of breast cancer by 3 sister, we recommend her to undergo genetic testing to ruled out inheritable breast cancer. She is agreeable.  -Her DCIS will be cured by complete surgical resection. Any form of adjuvant therapy is preventive. -Given her strongly positive ER and PR, I do recommend antiestrogen therapy with Tamoxifen given osteopenia, which decrease her risk of future breast cancer by ~50%.  ---The potential side effects, which includes but not limited to, hot flash, skin and vaginal dryness, slightly increased risk of cardiovascular disease and cataract, small risk of thrombosis and endometrial cancer, were discussed with her in great details. Preventive strategies for thrombosis, such as being physically active, using compression stocks, avoid cigarette  smoking, etc., were reviewed with her. I also recommend her to follow-up with her gynecologist, and watch for vaginal spotting or bleeding, as a clinically sign of endometrial cancer, etc. She voiced good understanding, and agrees to proceed. Will start after surgery -Given the purpose of treatment is preventive, her advanced age, if she does experience significant side effects, I have low threshold to stop tamoxifen. --She saw Dr Lisbeth Renshaw today. Given her age, she may forego Radiation. Will re-evaluate after surgery.  -We also discussed that biopsy may have sampling limitation, we will review her surgical path, to see if she has any invasive carcinoma components. -We discussed breast cancer surveillance after she completes treatment, Including annual mammogram, breast exam every 6-12 months. -Labs reviewed, WBC 3.8, BG 139, Protein 8.6. She has 3cm mass of her left breast from her biopsy.  -She will proceed with surgery soon. I will f/u with her  1-2 months.   2. Osteopenia  -Her 12/2017 DEXA showed osteopenia with lowest T-score -2 at [facility].  -She will proceed with Tamoxifen which can help strengthen her bones.    3. Comorbidities: Spinal stenosis, PAD, HTN, HLD, borderline DM -Her spinal stenosis requires she ambulate with walker. She does have occasional back pain. She has some right hip pain as well.  -She is not on medication for borderline DM. She is on medication for HTN and HLD. I encouraged her to continue.    PLAN:  -Proceed with surgery soon  - F/u in 1-2 months, plan to start tamoxifen on next visit     No orders of the defined types were placed in this encounter.  All questions were answered. The patient knows to call the clinic with any problems, questions or concerns. No barriers to learning was detected. I spent {CHL ONC TIME VISIT - SWNIO:[id number]} counseling the patient face to face. The total time spent in the appointment was {CHL ONC TIME VISIT - HWEXH:[id number]} and more than 50% was on counseling and review of test results     Truitt Merle, MD 07/21/20

## 2020-07-21 NOTE — Telephone Encounter (Signed)
R/s appt per 5/3 sch msg. Pt's daughter is aware.

## 2020-07-22 NOTE — Progress Notes (Signed)
[place]   Telephone:([phone]) [phone] Fax:(336) [phone]   Clinic Follow up Note   Patient Care Team: Willey Blade, MD as PCP - General (Internal Medicine) Sharyne Peach, MD as Consulting Physician (Ophthalmology) Lafayette Dragon, MD (Inactive) as Consulting Physician (Gastroenterology) Armandina Gemma, MD as Consulting Physician (General Surgery) Rockwell Germany, RN as Oncology Nurse Navigator Mauro Kaufmann, RN as Oncology Nurse Navigator Jovita Kussmaul, MD as Consulting Physician (General Surgery) Truitt Merle, MD as Consulting Physician (Hematology) Kyung Rudd, MD as Consulting Physician (Radiation Oncology)  Date of Service:  07/24/2020  CHIEF COMPLAINT: F/u of left breast cancer   SUMMARY OF ONCOLOGIC HISTORY: Oncology History Overview Note  Cancer Staging Ductal carcinoma in situ (DCIS) of left breast Staging form: Breast, AJCC 8th Edition - Clinical stage from 05/13/2020: Stage 0 (cTis (DCIS), cN0, cM0, G3, ER+, PR+, HER2: Not Assessed) - Signed by Truitt Merle, MD on 05/19/2020 Stage prefix: Initial diagnosis    Malignant neoplasm of upper-inner quadrant of left breast in female, estrogen receptor positive ([place])  05/13/2020 Cancer Staging   Staging form: Breast, AJCC 8th Edition - Clinical stage from 05/13/2020: Stage 0 (cTis (DCIS), cN0, cM0, G3, ER+, PR+, HER2: Not Assessed) - Signed by Truitt Merle, MD on 05/19/2020 Stage prefix: Initial diagnosis   05/13/2020 Mammogram    IMPRESSION:  1.1 x 1.0 x 0.9 cm mass in the 9:30 o'clock position of the left breast 5 cm from the nipple with imaging features suspicious for malignancy.    05/13/2020 Initial Biopsy   Diagnosis Breast, left, needle core biopsy, 9:30 O'CLOCK left breast AMENDED DIAGNOSIS: - HIGH GRADE DUCTAL CARCINOMA IN SITU WITH CENTRAL NECROSIS AND CALCIFICATIONS. - FOCUS SUSPICIOUS FOR MICROINVASION. Microscopic Comment E-cadherin is positive supporting a ductal phenotype. In performing  E-cadherin, subsequent leveling revealed a focus suspicious for microinvasion. Ancillary studies will be reported separately. Revised results reported to [facility] on 05/15/2020. Dr. Tresa Moore reviewed.   05/13/2020 Receptors her2   Results: IMMUNOHISTOCHEMICAL AND MORPHOMETRIC ANALYSIS PERFORMED MANUALLY Estrogen Receptor: 95%, POSITIVE, STRONG STAINING INTENSITY Progesterone Receptor: 95%, POSITIVE, STRONG STAINING INTENSITY   05/18/2020 Initial Diagnosis   Ductal carcinoma in situ (DCIS) of left breast   06/10/2020 Genetic Testing   Negative genetic testing:  No pathogenic variants detected on the [employer] CancerNext-Expanded + RNAinsight panel. Two variants of uncertain significance (VUS) were detected - one in the BRCA1 gene called c.4735C>G (p.P1579A) and a second in the BRCA2 gene called c.1741T>C (p.S581P). The report date is 06/10/2020.  The CancerNext-Expanded + RNAinsight gene panel offered by [employer] and includes sequencing and rearrangement analysis for the following 77 genes: AIP, ALK, APC, ATM, AXIN2, BAP1, BARD1, BLM, BMPR1A, BRCA1, BRCA2, BRIP1, CDC73, CDH1, CDK4, CDKN1B, CDKN2A, CHEK2, CTNNA1, DICER1, FANCC, FH, FLCN, GALNT12, KIF1B, LZTR1, MAX, MEN1, MET, MLH1, MSH2, MSH3, MSH6, MUTYH, NBN, NF1, NF2, NTHL1, PALB2, PHOX2B, PMS2, POT1, PRKAR1A, PTCH1, PTEN, RAD51C, RAD51D, RB1, RECQL, RET, SDHA, SDHAF2, SDHB, SDHC, SDHD, SMAD4, SMARCA4, SMARCB1, SMARCE1, STK11, SUFU, TMEM127, TP53, TSC1, TSC2, VHL and XRCC2 (sequencing and deletion/duplication); EGFR, EGLN1, HOXB13, KIT, MITF, PDGFRA, POLD1 and POLE (sequencing only); EPCAM and GREM1 (deletion/duplication only). RNA data is routinely analyzed for use in variant interpretation for all genes.    06/17/2020 Cancer Staging   Staging form: Breast, AJCC 8th Edition - Pathologic stage from 06/17/2020: Stage Unknown (pT12m, pNX, cM0, G2, ER+, PR+, HER2: Not Assessed) - Signed by FTruitt Merle MD on 07/26/2020 Stage prefix:  Initial diagnosis Histologic grading  system: 3 grade system Residual tumor (R): R0 - None   06/17/2020 Surgery   LEFT BREAST LUMPECTOMY WITH RADIOACTIVE SEED LOCALIZATION by Dr Marlou Starks   06/17/2020 Pathology Results   FINAL MICROSCOPIC DIAGNOSIS:   A. BREAST, LEFT, LUMPECTOMY:  - Focus of microinvasive carcinoma (<1 mm) arising in association with  high-grade ductal carcinoma in situ, see comment  - Resection margins are negative; the superior margin is 0.2 cm from  focus of microinvasive carcinoma and DCIS  - Biopsy site changes  - Fibrocystic change with calcifications  - See oncology table      CURRENT THERAPY:  Tamoxifen 35m once daily starting May 2022.   INTERVAL HISTORY:  Kristine LUTZEis here for a follow up of left breast cancer. She was last seen by me 2 months ago. She presents to the clinic with her daughter. She notes her breast surgery went well and she has recovered well. She notes she consulted with Rad Onc and was not recommended to proceed with radiation. She is agreeable. I reviewed her medication list with her. She was seen by her PCP last week.  She notes she lives alone, but one of her daughter's live with her.     REVIEW OF SYSTEMS:   Constitutional: Denies fevers, chills or abnormal weight loss Eyes: Denies blurriness of vision Ears, nose, mouth, throat, and face: Denies mucositis or sore throat Respiratory: Denies cough, dyspnea or wheezes Cardiovascular: Denies palpitation, chest discomfort or lower extremity swelling Gastrointestinal:  Denies nausea, heartburn or change in bowel habits Skin: Denies abnormal skin rashes Lymphatics: Denies new lymphadenopathy or easy bruising Neurological:Denies numbness, tingling or new weaknesses Behavioral/Psych: Mood is stable, no new changes  All other systems were reviewed with the patient and are negative.  MEDICAL HISTORY:  Past Medical History:  Diagnosis Date  . Abnormal EKG   . Asthma   . Carotid artery  occlusion   . DDD (degenerative disc disease)   . Diabetes mellitus without complication ([facility]   . Family history of breast cancer   . Family history of GI tract cancer   . Family history of prostate cancer   . Hyperlipidemia   . Hypertension   . PAD (peripheral artery disease) ([facility]   . Right lower quadrant abdominal abscess ([facility]   . SBO (small bowel obstruction) ([facility] 03/15/2014  . Spinal stenosis   . Type II or unspecified type diabetes mellitus without mention of complication, not stated as uncontrolled 03/22/2013  . Vitamin D deficiency     SURGICAL HISTORY: Past Surgical History:  Procedure Laterality Date  . BREAST LUMPECTOMY WITH RADIOACTIVE SEED LOCALIZATION Left 06/17/2020   Procedure: LEFT BREAST LUMPECTOMY WITH RADIOACTIVE SEED LOCALIZATION;  Surgeon: TJovita Kussmaul MD;  [facility]  Service: General;  Laterality: Left;  . CHOLECYSTECTOMY    . CYST EXCISION  back  . HERNIA REPAIR    . LAPAROSCOPIC APPENDECTOMY N/A 11/15/2014   Procedure: APPENDECTOMY LAPAROSCOPIC;  Surgeon: SMichael Boston MD;  Location: [facility];  Service: General;  Laterality: N/A;  . MINI-LAPAROTOMY W/ TUBAL LIGATION  1964    I have reviewed the social history and family history with the patient and they are unchanged from previous note.  ALLERGIES:  is allergic to ace inhibitors, metformin and related, penicillins, grapefruit extract, and minoxidil.  MEDICATIONS:  Current Outpatient Medications  Medication Sig Dispense Refill  . tamoxifen (NOLVADEX) 20 MG tablet Take 1 tablet (20 mg total) by mouth daily. 30 tablet 3  .  amLODipine (NORVASC) 10 MG tablet Take 10 mg by mouth daily.    Marland Kitchen atenolol (TENORMIN) 100 MG tablet TAKE ONE TABLET BY MOUTH DAILY *PLEASE SCHEDULE F/U APPOINTMENT TO OBTAIN FURTHER REFILLS* 30 tablet 0  . Cholecalciferol (VITAMIN D-3) 1000 UNITS CAPS Take 2,000 Units by mouth daily.     . cilostazol (PLETAL) 50 MG tablet TAKE ONE TABLET BY MOUTH TWICE A DAY  180 tablet 0  . Coenzyme Q10 200 MG capsule Take 200 mg by mouth daily.    . Cyanocobalamin (VITAMIN B 12 PO) Take 500 mcg by mouth daily.    Marland Kitchen diltiazem (CARDIZEM) 120 MG tablet TAKE ONE TABLET BY MOUTH DAILY 90 tablet 1  . Flaxseed, Linseed, (FLAXSEED OIL) 1000 MG CAPS Take 1,000 mg by mouth 4 (four) times daily.    . Lancets (FREESTYLE) lancets Test glucose 1 time daily 100 each 1  . Magnesium Oxide (MAG-OX PO) Take 1 tablet by mouth daily.     . Multiple Vitamin (MULTIVITAMIN ADULT PO) Take 1 tablet by mouth daily at 6 (six) AM.    . oxybutynin (DITROPAN) 5 MG tablet Take 5 mg by mouth 2 (two) times daily.    . valsartan-hydrochlorothiazide (DIOVAN-HCT) 160-25 MG tablet TAKE ONE TABLET BY MOUTH DAILY 90 tablet 1  . vitamin C (ASCORBIC ACID) 250 MG tablet Take 250 mg by mouth daily.     No current facility-administered medications for this visit.    PHYSICAL EXAMINATION: ECOG PERFORMANCE STATUS: 1 - Symptomatic but completely ambulatory  Vitals:   07/24/20 1450  BP: (!) 169/75  Pulse: 76  Resp: 18  Temp: 98 F (36.7 C)  SpO2: 96%   Filed Weights   07/24/20 1450  Weight: 161 lb (73 kg)    GENERAL:alert, no distress and comfortable SKIN: skin color, texture, turgor are normal, no rashes or significant lesions EYES: normal, Conjunctiva are pink and non-injected, sclera clear  NECK: supple, thyroid normal size, non-tender, without nodularity LYMPH:  no palpable lymphadenopathy in the cervical, axillary  LUNGS: clear to auscultation and percussion with normal breathing effort HEART: regular rate & rhythm and no murmurs and no lower extremity edema ABDOMEN:abdomen soft, non-tender and normal bowel sounds Musculoskeletal:no cyanosis of digits and no clubbing  NEURO: alert & oriented x 3 with fluent speech, no focal motor/sensory deficits BREAST: S/p left lumpectomy: surgical incision healed well. No palpable mass, nodules or adenopathy bilaterally. Breast exam benign.  EXAM  PERFORMED IN WHEELCHAIR TODAY   LABORATORY DATA:  I have reviewed the data as listed CBC Latest Ref Rng & Units 07/13/2020 05/20/2020 01/01/2018  WBC 4.0 - 10.5 K/uL 4.8 3.8(L) 13.6(H)  Hemoglobin 12.0 - 15.0 g/dL 11.2(L) 12.3 10.7(L)  Hematocrit 36.0 - 46.0 % 33.6(L) 36.8 32.9(L)  Platelets 150 - 400 K/uL 248 255 173     CMP Latest Ref Rng & Units 07/13/2020 06/12/2020 05/20/2020  Glucose 70 - 99 mg/dL 114(H) 144(H) 139(H)  BUN 8 - 23 mg/dL 23 28(H) 22  Creatinine 0.44 - 1.00 mg/dL 0.82 0.99 0.90  Sodium 135 - 145 mmol/L 136 134(L) 136  Potassium 3.5 - 5.1 mmol/L 3.9 4.3 4.5  Chloride 98 - 111 mmol/L 100 102 101  CO2 22 - 32 mmol/L '30 25 27  ' Calcium 8.9 - 10.3 mg/dL 9.2 9.4 10.0  Total Protein 6.5 - 8.1 g/dL 7.4 - 8.6(H)  Total Bilirubin 0.3 - 1.2 mg/dL 0.4 - 0.4  Alkaline Phos 38 - 126 U/L 227(H) - 66  AST 15 - 41  U/L 304(H) - 27  ALT 0 - 44 U/L 233(H) - 16      RADIOGRAPHIC STUDIES: I have personally reviewed the radiological images as listed and agreed with the findings in the report. No results found.   ASSESSMENT & PLAN:  YAMILE ROEDL is a 87 y.o. female with    1.   Malignant neoplasm of upper-inner quadrant of left breast, DCIS with microinvasion, pT15mNx, stage IA, with high grade DCIS, ER+/PR+ -She was diagnosis in 04/2020 with 1.1cm mass in left breast and biopsy showed high grade DCIS. She underwent left lumpectomy on 06/17/20. Surgical path showed <134mof microinvasive carcinoma, otherwise showed high grade DICS which was completely resected and clear margins.  -She consulted with Rad Onc and given her age and only microinvasion, adjuvant radiation was not recommended.   -Given her strongly positive ER and PR, I discussed adjuvant antiestrogen therapy with Tamoxifen given osteopenia, which decrease her risk of future breast cancer by ~50%.   -The potential side effects, which includes but not limited to, hot flash, skin and vaginal dryness, slightly increased risk of  cardiovascular disease and cataract, small risk of thrombosis and endometrial cancer, were discussed with her in great details. She agreed to proceed.  -I discussed if she is not able to tolerate she is fine to stop antiestrogen therapy given low risk of recurrence and her advanced age.  -She is clinically doing well. Lab reviewed from last month, her CBC and CMP are within normal limits except Hg 11.2 and BG 114. Her physical exam was unremarkable. There is no clinical concern for recurrence. -Will proceed with surveillance. Next mammogram in 03/2021.  -Proceed with survivorship clinic in 3 months. F/u with me in 6 months    2. Osteopenia  -Her 12/2017 DEXA showed osteopenia with lowest T-score -2 at [facility].  -She will proceed with Tamoxifen which can help strengthen her bones.    3. Comorbidities: Spinal stenosis, PAD, HTN, HLD, borderline DM -Her spinal stenosis requires she ambulate with walker. She does have occasional back pain. She has some right hip pain as well.  -Her medications were recently altered by her PCP in April 2022 and instructed to change her diet. Her daughter is interested in more information about her diet. I will send dietician referral.    PLAN:  -I called in Tamoxifen to start in late May  -Survivorship clinic in 3 months with NP Kristine Burnett  -Lab and F/u 6 months  -Send dietician referral per pt's request   No problem-specific Assessment & Plan notes found for this encounter.   Orders Placed This Encounter  Procedures  . [facility]    Referral Priority:   Routine    Referral Type:   Consultation    Referral Reason:   Specialty Services Required    Number of Visits Requested:   1   All questions were answered. The patient knows to call the clinic with any problems, questions or concerns. No barriers to learning was detected. The total time spent in the appointment was 30 minutes.     YaTruitt MerleMD 07/24/2020   I, AmJoslyn Devonam acting as scribe for YaTruitt MerleMD.   I have reviewed the above documentation for accuracy and completeness, and I agree with the above.

## 2020-07-24 ENCOUNTER — Telehealth: Payer: Self-pay | Admitting: Hematology

## 2020-07-24 ENCOUNTER — Other Ambulatory Visit: Payer: Self-pay

## 2020-07-24 ENCOUNTER — Inpatient Hospital Stay: Payer: Medicare HMO | Admitting: Hematology

## 2020-07-24 VITALS — BP 169/75 | HR 76 | Temp 98.0°F | Resp 18 | Ht 66.0 in | Wt 161.0 lb

## 2020-07-24 DIAGNOSIS — Z79810 Long term (current) use of selective estrogen receptor modulators (SERMs): Secondary | ICD-10-CM | POA: Diagnosis not present

## 2020-07-24 DIAGNOSIS — M4800 Spinal stenosis, site unspecified: Secondary | ICD-10-CM | POA: Diagnosis not present

## 2020-07-24 DIAGNOSIS — E785 Hyperlipidemia, unspecified: Secondary | ICD-10-CM | POA: Diagnosis not present

## 2020-07-24 DIAGNOSIS — I10 Essential (primary) hypertension: Secondary | ICD-10-CM | POA: Diagnosis not present

## 2020-07-24 DIAGNOSIS — D0512 Intraductal carcinoma in situ of left breast: Secondary | ICD-10-CM

## 2020-07-24 DIAGNOSIS — Z803 Family history of malignant neoplasm of breast: Secondary | ICD-10-CM | POA: Diagnosis not present

## 2020-07-24 DIAGNOSIS — R7303 Prediabetes: Secondary | ICD-10-CM | POA: Diagnosis not present

## 2020-07-24 DIAGNOSIS — C50212 Malignant neoplasm of upper-inner quadrant of left female breast: Secondary | ICD-10-CM

## 2020-07-24 DIAGNOSIS — Z79899 Other long term (current) drug therapy: Secondary | ICD-10-CM | POA: Diagnosis not present

## 2020-07-24 DIAGNOSIS — Z170 Estrogen receptor positive status [ER+]: Secondary | ICD-10-CM | POA: Diagnosis not present

## 2020-07-24 DIAGNOSIS — M8580 Other specified disorders of bone density and structure, unspecified site: Secondary | ICD-10-CM | POA: Diagnosis not present

## 2020-07-24 MED ORDER — TAMOXIFEN CITRATE 20 MG PO TABS: 20.0000 mg | ORAL_TABLET | Freq: Every day | ORAL | 3 refills | Status: DC

## 2020-07-24 NOTE — Telephone Encounter (Signed)
Scheduled follow-up appointments per 5/6 los. Patient is aware. 

## 2020-07-26 ENCOUNTER — Encounter: Payer: Self-pay | Admitting: Hematology

## 2020-07-27 ENCOUNTER — Encounter: Payer: Self-pay | Admitting: *Deleted

## 2020-07-28 ENCOUNTER — Encounter: Payer: Self-pay | Admitting: Gastroenterology

## 2020-08-18 ENCOUNTER — Inpatient Hospital Stay: Payer: Medicare HMO | Admitting: Dietician

## 2020-08-18 ENCOUNTER — Other Ambulatory Visit: Payer: Self-pay

## 2020-08-18 NOTE — Progress Notes (Signed)
Nutrition Assessment   Reason for Assessment: Pt request   ASSESSMENT: 87 year old female with DCIS of left breast. She is s/p lumpectomy on 3/30. Adjuvant radiation was not recommended given age and only microinvasion. Patient is receiving antiestrogen therapy with Tamoxifen.   Past medical history of CAD, DM2, HLD, HTN, SBO, Spinal stenosis, PAD   Met with patient and daughter in clinic. Patient reports she has a good appetite and eats 3 meals and occasional snacks. [place], reports eating off the land and only had go to the store for things such as flour. Patient continues to enjoy a diet rich in fruits and vegetables, likes eggs with caramelized onions, sweet potatoes, cheese, greens, bananas, fish, chicken livers. She enjoys fried foods but has limited herself to once weekly. Patient and family no longer are seasoning foods with salt, pt reports foods have been bland and asking for alternative ways to enhance flavors as well as daily amount of sodium allowed. Patient does not drink water a lot of water, reports usually drinking before and after meals. One of her daughters brought her some ginger ale to drink due to cancer healing properties of ginger. Patient asking if she can have carbonated beverage due to recent inflamed bowel.   Medications: B12, D3, Coenzyme Q10, Flaxseed oil, Mag0ox, MVI, Vit C   Labs:  4/25 labs reviewed   Anthropometrics:   Height: 5'4" Weight: 161 lb UBW: 160-162 lb  BMI: 25.99   NUTRITION DIAGNOSIS: Food and nutrition related knowledge deficit related to cancer as evidenced by patient request for dietary recommendations    INTERVENTION:  Provided education on plant-based diet, handout given  Encouraged to continue eating regular diet  Discussed balanced meals/snacks for glucose control Discussed strategies for enhancing flavors without using salt Educated on support groups, cooking classes, virtual opportunities offered at [facility],  handouts provided  MONITORING, EVALUATION, GOAL: weight trends, intake   Next Visit: No follow-up scheduled at this time. Patient has contact information, encouraged to contact as needed       

## 2020-09-03 ENCOUNTER — Encounter: Payer: Self-pay | Admitting: Gastroenterology

## 2020-09-03 ENCOUNTER — Ambulatory Visit (INDEPENDENT_AMBULATORY_CARE_PROVIDER_SITE_OTHER): Payer: Medicare HMO | Admitting: Gastroenterology

## 2020-09-03 ENCOUNTER — Other Ambulatory Visit (INDEPENDENT_AMBULATORY_CARE_PROVIDER_SITE_OTHER): Payer: Medicare HMO

## 2020-09-03 VITALS — BP 134/60 | HR 80 | Ht 64.0 in | Wt 162.0 lb

## 2020-09-03 DIAGNOSIS — R1013 Epigastric pain: Secondary | ICD-10-CM

## 2020-09-03 DIAGNOSIS — R748 Abnormal levels of other serum enzymes: Secondary | ICD-10-CM

## 2020-09-03 DIAGNOSIS — R932 Abnormal findings on diagnostic imaging of liver and biliary tract: Secondary | ICD-10-CM

## 2020-09-03 LAB — HEPATIC FUNCTION PANEL
ALT: 18 U/L (ref 0–35)
AST: 28 U/L (ref 0–37)
Albumin: 4.3 g/dL (ref 3.5–5.2)
Alkaline Phosphatase: 68 U/L (ref 39–117)
Bilirubin, Direct: 0.1 mg/dL (ref 0.0–0.3)
Total Bilirubin: 0.4 mg/dL (ref 0.2–1.2)
Total Protein: 9 g/dL — ABNORMAL HIGH (ref 6.0–8.3)

## 2020-09-03 NOTE — Patient Instructions (Addendum)
If you are age 85 or older, your body mass index should be between 23-30. Your Body mass index is 27.81 kg/m. If this is out of the aforementioned range listed, please consider follow up with your Primary Care Provider.  If you are age 68 or younger, your body mass index should be between 19-25. Your Body mass index is 27.81 kg/m. If this is out of the aformentioned range listed, please consider follow up with your Primary Care Provider.   __________________________________________________________  The [facility] providers would like to encourage you to use [facility] to communicate with providers for non-urgent requests or questions.  Due to long hold times on the telephone, sending your provider a message by [facility] may be a faster and more efficient way to get a response.  Please allow 48 business hours for a response.  Please remember that this is for non-urgent requests.   Please go to the lab in the basement of our building to have lab work done as you leave today. Hit "B" for basement when you get on the elevator.  When the doors open the lab is on your left.  We will call you with the results. Thank you.  You will be contacted by [facility] in the next 5 days to arrange a MRCP of your abdomen.  The number on your caller ID will be [phone], please answer when they call.  If you have not heard from them in 2 days please call [phone] to schedule.   Thank you for entrusting me with your care and for choosing [facility], Dr. [place] Cellar

## 2020-09-03 NOTE — Progress Notes (Signed)
HPI :  87 year old female with a history of breast cancer, diabetes, cholecystectomy, referred here by Willey Blade, MD for follow-up after hospitalization for abdominal pain and abnormal liver enzymes.  I last saw her in the office in November 2016.  She states she was in her usual state of health and went to the emergency room on April 25.  She states she developed discomfort in her epigastric area associate with nausea and vomiting.  She states this only lasted about 10 minutes or so but her family brought her to the emergency room the next day to be evaluated.  She states at the time of her ED evaluation she actually felt well without any symptoms.  She had labs drawn which showed that her AST was 304, ALT 233, alk phos 227, T bili 0.4.  She had a negative acute hepatitis panel and negative lipase.  In March of this year she had normal liver enzymes.  Interestingly in 2019 she also had elevated liver enzymes with transaminitis is upwards of 500s.  In 2019 she had an MRCP which showed a dilated common bile duct to 1.3 cm without obstructing mass or choledocholithiasis.  This dilation appeared stable in size dating back to 2016.  In the ER she had a CT scan which did not show any acute pathology.  Her liver appeared unremarkable but there was intrahepatic and extrahepatic biliary ductal dilation approximately CBD measured 2 cm in diameter, larger than previous, there was no obvious calcified stone in the bile duct.  She had cholecystectomy done in 2010 and had an ERCP at that time for retained gallstones.  She denies any new medications prior to onset of this episode.  Her tamoxifen use started after this occurrence.  She has not had any episodes of abdominal pain since that time and has been feeling well.  She is eating well without complaints.  She is tolerating her tamoxifen.  She had had her statin held once her liver enzymes were elevated.  She has not been drinking any alcohol.  She takes  flaxseeds and some vitamins over-the-counter but no other herbal supplements or new medications.  Her daughter is present at the clinic visit today, we discussed how aggressive she want to be with her care given her age and other medical problems.  She tolerated her mastectomy quite well for recent breast cancer diagnosis   Last seen 01/2015 History of cholecystectomy   Labs: 07/13/20 AST 304, ALT 233, AP 227, T bil 0.4  INR normal 1.0 Negative acute hepatitis panel Lipase 18  05/20/20 - normal LAES  12/2017 - AST ranged from 200 to 500s, ALT 100-200s, T bil 2.8   Last colonoscopy in 05/2008 was normal other than melanosis colis. No polyps. Sister had colon cancer, diagnosed at age 24    CT abdomen / pelvis - 07/13/20 - IMPRESSION: 1. No acute intra-abdominal or pelvic pathology. 2. Sigmoid diverticulosis. No bowel obstruction. 3. Aortic Atherosclerosis (ICD10-I70.0).  Hepatobiliary: The liver is unremarkable. There is intrahepatic and extrahepatic biliary ductal dilatation, likely post cholecystectomy. The common bile duct measures approximately 2 cm in diameter. No retained calcified stone noted in the central CBD.   MRCP 12/2017 - IMPRESSION: 1. Significantly diminished exam detail secondary to respiratory motion artifact. 2. Chronic increase caliber of the common bile duct with mild intrahepatic biliary dilatation status post cholecystectomy. This dates back to 11/15/14. No obstructing mass or choledocholithiasis identified. If there is a clinical concern for common bile duct obstruction then further  evaluation with ERCP may be helpful. Additionally, contrast enhanced pancreas protocol CT of the abdomen may be less susceptible to respiratory motion artifact. 3. Mild hepatic steatosis. 4. Right kidney cysts, difficult to characterize due to motion artifact.      Past Medical History:  Diagnosis Date   Abnormal EKG    Asthma    Carotid artery occlusion    DDD  (degenerative disc disease)    Diabetes mellitus without complication ([place])    Family history of breast cancer    Family history of GI tract cancer    Family history of prostate cancer    Hyperlipidemia    Hypertension    PAD (peripheral artery disease) ([facility])    Right lower quadrant abdominal abscess ([facility])    SBO (small bowel obstruction) ([place]) 03/15/2014   Spinal stenosis    Type II or unspecified type diabetes mellitus without mention of complication, not stated as uncontrolled 03/22/2013   Vitamin D deficiency      Past Surgical History:  Procedure Laterality Date   BREAST LUMPECTOMY WITH RADIOACTIVE SEED LOCALIZATION Left 06/17/2020   Procedure: LEFT BREAST LUMPECTOMY WITH RADIOACTIVE SEED LOCALIZATION;  Surgeon: Jovita Kussmaul, MD;  Location: [facility];  Service: General;  Laterality: Left;   CHOLECYSTECTOMY     CYST EXCISION  back   HERNIA REPAIR     LAPAROSCOPIC APPENDECTOMY N/A 11/15/2014   Procedure: APPENDECTOMY LAPAROSCOPIC;  Surgeon: Michael Boston, MD;  Location: [facility];  Service: General;  Laterality: N/A;   MINI-LAPAROTOMY W/ TUBAL LIGATION  1964   Family History  Problem Relation Age of Onset   Heart attack Mother    Hypertension Mother    Heart attack Father    Stroke Sister    Breast cancer Sister    Breast cancer Sister    Colon cancer Sister    Breast cancer Sister    Prostate cancer Brother    Heart attack Brother        x 2   Cancer Maternal Grandmother        Gastrointestinal, dx >50   Stroke Paternal Grandmother    Stroke Paternal Grandfather    Breast cancer Cousin        maternal first cousin   Social History   Tobacco Use   Smoking status: Never   Smokeless tobacco: Never  Vaping Use   Vaping Use: Never used  Substance Use Topics   Alcohol use: No    Alcohol/week: 0.0 standard drinks   Drug use: No   Current Outpatient Medications  Medication Sig Dispense Refill   amLODipine (NORVASC) 10 MG tablet Take 10 mg by mouth  daily.     atenolol (TENORMIN) 100 MG tablet TAKE ONE TABLET BY MOUTH DAILY *PLEASE SCHEDULE F/U APPOINTMENT TO OBTAIN FURTHER REFILLS* 30 tablet 0   Cholecalciferol (VITAMIN D-3) 1000 UNITS CAPS Take 2,000 Units by mouth daily.      cilostazol (PLETAL) 50 MG tablet TAKE ONE TABLET BY MOUTH TWICE A DAY 180 tablet 0   Coenzyme Q10 200 MG capsule Take 200 mg by mouth daily.     Cyanocobalamin (VITAMIN B 12 PO) Take 500 mcg by mouth daily.     diltiazem (CARDIZEM) 120 MG tablet TAKE ONE TABLET BY MOUTH DAILY 90 tablet 1   Flaxseed, Linseed, (FLAXSEED OIL) 1000 MG CAPS Take 1,000 mg by mouth 4 (four) times daily.     Lancets (FREESTYLE) lancets Test glucose 1 time daily 100 each 1   Magnesium  Oxide (MAG-OX PO) Take 1 tablet by mouth daily.      Multiple Vitamin (MULTIVITAMIN ADULT PO) Take 1 tablet by mouth daily at 6 (six) AM.     oxybutynin (DITROPAN) 5 MG tablet Take 5 mg by mouth 2 (two) times daily.     tamoxifen (NOLVADEX) 20 MG tablet Take 1 tablet (20 mg total) by mouth daily. 30 tablet 3   valsartan-hydrochlorothiazide (DIOVAN-HCT) 160-25 MG tablet TAKE ONE TABLET BY MOUTH DAILY 90 tablet 1   vitamin C (ASCORBIC ACID) 250 MG tablet Take 250 mg by mouth daily.     No current facility-administered medications for this visit.   Allergies  Allergen Reactions   Ace Inhibitors Cough    Enalapril OK   Metformin And Related Diarrhea   Penicillins Other (See Comments)    unknown Has patient had a PCN reaction causing immediate rash, facial/tongue/throat swelling, SOB or lightheadedness with hypotension: No Has patient had a PCN reaction causing severe rash involving mucus membranes or skin necrosis: No Has patient had a PCN reaction that required hospitalization: No Has patient had a PCN reaction occurring within the last 10 years: No If all of the above answers are "NO", then may proceed with Cephalosporin use.    Grapefruit Extract Rash   Minoxidil Other (See Comments)    Grow hair on  face     Review of Systems: All systems reviewed and negative except where noted in HPI.   Labs reviewed in epic as above  Physical Exam: BP 134/60 (BP Location: Right Arm, Patient Position: Sitting, Cuff Size: Normal)   Pulse 80   Ht '5\' 4"'  (1.626 m)   Wt 162 lb (73.5 kg)   BMI 27.81 kg/m  Constitutional: Pleasant,well-developed, female in no acute distress, using walker to ambulate HEENT: Normocephalic and atraumatic. Conjunctivae are normal. No scleral icterus. Neck supple.  Cardiovascular: Normal rate, regular rhythm.  Pulmonary/chest: Effort normal and breath sounds normal.  Abdominal: Soft, nondistended, nontender.  There are no masses palpable.  Extremities: no edema Lymphadenopathy: No cervical adenopathy noted. Neurological: Alert and oriented to person place and time. Skin: Skin is warm and dry. No rashes noted. Psychiatric: Normal mood and affect. Behavior is normal.   ASSESSMENT AND PLAN: 87 year old female here for reassessment of the following:  Elevated liver enzymes Abnormal imaging of the biliary tree Epigastric pain / vomiting  As above, the patient with acute abdominal pain with vomiting that led to an emergency room visit the following day when she was feeling well.  At that time she had elevated liver enzymes and a CT scan which showed interval worsening of chronic biliary ductal dilation that had been noted on MRCP in 2019.  Common bile duct upwards of 2 cm in size.  She has had a cholecystectomy in the past with ERCP for stone retention.  We discussed differential diagnosis.  Given her elevated liver enzymes and dilation of the duct, perhaps she had a retained/intrahepatic stone that passed, while ampullary lesion also remains on the differential that would not be expected to cause fluctuations in liver enzymes like this.  We discussed with her mother how aggressive she wanted to be with her care in regards to endoscopy/EUS, etc to evaluate this issue. she  would like to avoid any invasive procedures if possible.  Given the CT imaging of the interval enlargement of the bile duct I would recommend an MRCP to further evaluate this and clear her biliary tree of any concerning pathology.  Patient and  daughter were in agreement with this.  I will otherwise check her LFTs today to make sure they have intervally normalized or improved given they were moderately elevated before.  They will be contacted with these results.  Otherwise we will await results of her MRCP with further recommendations. Patient and daughter agree with the plan.  She has had resolution of her pain in the interim, if this recurs she needs to contact me and would have low threshold to recheck her liver enzymes and lipase.  [place] Cellar, MD [facility]  CC: Willey Blade, MD

## 2020-09-08 ENCOUNTER — Ambulatory Visit (HOSPITAL_COMMUNITY): Payer: Medicare HMO

## 2020-09-17 ENCOUNTER — Ambulatory Visit (HOSPITAL_COMMUNITY)
Admission: RE | Admit: 2020-09-17 | Discharge: 2020-09-17 | Disposition: A | Discharge status: Home / Self Care | Payer: Medicare HMO | Source: Ambulatory Visit | Attending: Gastroenterology | Admitting: Gastroenterology

## 2020-09-17 ENCOUNTER — Other Ambulatory Visit: Payer: Self-pay

## 2020-09-17 ENCOUNTER — Other Ambulatory Visit: Payer: Self-pay | Admitting: Gastroenterology

## 2020-09-17 DIAGNOSIS — R932 Abnormal findings on diagnostic imaging of liver and biliary tract: Secondary | ICD-10-CM | POA: Insufficient documentation

## 2020-09-17 DIAGNOSIS — R1013 Epigastric pain: Secondary | ICD-10-CM

## 2020-09-17 DIAGNOSIS — R748 Abnormal levels of other serum enzymes: Secondary | ICD-10-CM | POA: Insufficient documentation

## 2020-09-17 MED ORDER — GADOBUTROL 1 MMOL/ML IV SOLN
7.0000 mL | Freq: Once | INTRAVENOUS | Status: AC | PRN
  Administered 2020-09-17: 7 mL via INTRAVENOUS

## 2020-10-27 ENCOUNTER — Other Ambulatory Visit: Payer: Medicare HMO

## 2020-10-27 ENCOUNTER — Encounter: Payer: Medicare HMO | Admitting: Nurse Practitioner

## 2020-11-17 ENCOUNTER — Other Ambulatory Visit: Payer: Self-pay | Admitting: Hematology

## 2020-11-18 ENCOUNTER — Telehealth: Payer: Self-pay

## 2020-11-18 ENCOUNTER — Telehealth: Payer: Self-pay | Admitting: Hematology

## 2020-11-18 NOTE — Telephone Encounter (Signed)
-----   Message from Burna Mortimer, [place] sent at 11/18/2020  8:12 AM EDT ----- [facility]!  I hope you are doing well today. Unfortunately I did not get to this yesterday, I am so sorry! I was doing my best to handle Dr. Ernestina Penna desk. If you could follow up with Ms. Arey that would be really helpful. Thank you!!  Myriam Jacobson ----- Message ----- From: Alla Feeling, NP Sent: 11/17/2020   8:54 AM EDT To: Burna Mortimer, CMA  Good morning Myriam Jacobson, Please call pt to see how she is tolerating tamoxifen and if she is willing to schedule f/up with Dr. Burr Medico in September? She canceled her survivorship visit with me which is fine, but she still needs to be seen.   Thanks, lacie

## 2020-11-18 NOTE — Telephone Encounter (Signed)
Scheduled per sch msg. Called and spoke with patients daughter, confirmed appt

## 2020-11-18 NOTE — Telephone Encounter (Signed)
Called pt per message below. Pt states she is tolerating Tamoxifen with no noted side effects. Pt wishes to r/s appt that was cancelled 8/8. Pt states she did not cancel appt, she showed up for appt on 8/9 and was told her appt was cancelled on 8/8. Pt requests we call daughter, Kern Reap for appts.  The nurse called Kern Reap to make her aware scheduling will be calling her to get appt schedule for her mother in Sept. Pt's daughter verbalized understanding. Message sent to scheduling.

## 2020-11-20 ENCOUNTER — Encounter: Payer: Self-pay | Admitting: Hematology

## 2020-11-20 ENCOUNTER — Other Ambulatory Visit: Payer: Self-pay

## 2020-11-20 ENCOUNTER — Inpatient Hospital Stay: Payer: Medicare HMO

## 2020-11-20 ENCOUNTER — Inpatient Hospital Stay: Payer: Medicare HMO | Attending: Hematology | Admitting: Hematology

## 2020-11-20 VITALS — BP 158/76 | HR 70 | Temp 98.3°F | Resp 17 | Wt 162.8 lb

## 2020-11-20 DIAGNOSIS — Z170 Estrogen receptor positive status [ER+]: Secondary | ICD-10-CM | POA: Insufficient documentation

## 2020-11-20 DIAGNOSIS — M8580 Other specified disorders of bone density and structure, unspecified site: Secondary | ICD-10-CM | POA: Insufficient documentation

## 2020-11-20 DIAGNOSIS — I10 Essential (primary) hypertension: Secondary | ICD-10-CM | POA: Diagnosis not present

## 2020-11-20 DIAGNOSIS — M25551 Pain in right hip: Secondary | ICD-10-CM | POA: Diagnosis not present

## 2020-11-20 DIAGNOSIS — R7303 Prediabetes: Secondary | ICD-10-CM | POA: Diagnosis not present

## 2020-11-20 DIAGNOSIS — M4800 Spinal stenosis, site unspecified: Secondary | ICD-10-CM | POA: Insufficient documentation

## 2020-11-20 DIAGNOSIS — C50212 Malignant neoplasm of upper-inner quadrant of left female breast: Secondary | ICD-10-CM

## 2020-11-20 DIAGNOSIS — E785 Hyperlipidemia, unspecified: Secondary | ICD-10-CM | POA: Insufficient documentation

## 2020-11-20 DIAGNOSIS — Z79810 Long term (current) use of selective estrogen receptor modulators (SERMs): Secondary | ICD-10-CM | POA: Diagnosis not present

## 2020-11-20 LAB — CBC WITH DIFFERENTIAL/PLATELET
Abs Immature Granulocytes: 0.01 10*3/uL (ref 0.00–0.07)
Basophils Absolute: 0 10*3/uL (ref 0.0–0.1)
Basophils Relative: 1 %
Eosinophils Absolute: 0.1 10*3/uL (ref 0.0–0.5)
Eosinophils Relative: 3 %
HCT: 38.7 % (ref 36.0–46.0)
Hemoglobin: 12.8 g/dL (ref 12.0–15.0)
Immature Granulocytes: 0 %
Lymphocytes Relative: 40 %
Lymphs Abs: 1.5 10*3/uL (ref 0.7–4.0)
MCH: 30.8 pg (ref 26.0–34.0)
MCHC: 33.1 g/dL (ref 30.0–36.0)
MCV: 93.3 fL (ref 80.0–100.0)
Monocytes Absolute: 0.6 10*3/uL (ref 0.1–1.0)
Monocytes Relative: 16 %
Neutro Abs: 1.5 10*3/uL — ABNORMAL LOW (ref 1.7–7.7)
Neutrophils Relative %: 40 %
Platelets: 251 10*3/uL (ref 150–400)
RBC: 4.15 MIL/uL (ref 3.87–5.11)
RDW: 14.8 % (ref 11.5–15.5)
WBC: 3.8 10*3/uL — ABNORMAL LOW (ref 4.0–10.5)
nRBC: 0 % (ref 0.0–0.2)

## 2020-11-20 LAB — COMPREHENSIVE METABOLIC PANEL
ALT: 13 U/L (ref 0–44)
AST: 20 U/L (ref 15–41)
Albumin: 3.7 g/dL (ref 3.5–5.0)
Alkaline Phosphatase: 54 U/L (ref 38–126)
Anion gap: 12 (ref 5–15)
BUN: 24 mg/dL — ABNORMAL HIGH (ref 8–23)
CO2: 26 mmol/L (ref 22–32)
Calcium: 10.1 mg/dL (ref 8.9–10.3)
Chloride: 102 mmol/L (ref 98–111)
Creatinine, Ser: 1.25 mg/dL — ABNORMAL HIGH (ref 0.44–1.00)
GFR, Estimated: 41 mL/min — ABNORMAL LOW (ref >60)
Glucose, Bld: 147 mg/dL — ABNORMAL HIGH (ref 70–99)
Potassium: 4.7 mmol/L (ref 3.5–5.1)
Sodium: 140 mmol/L (ref 135–145)
Total Bilirubin: 0.3 mg/dL (ref 0.3–1.2)
Total Protein: 8.8 g/dL — ABNORMAL HIGH (ref 6.5–8.1)

## 2020-11-20 MED ORDER — TAMOXIFEN CITRATE 20 MG PO TABS: 20.0000 mg | ORAL_TABLET | Freq: Every day | ORAL | 3 refills | Status: DC

## 2020-11-20 NOTE — Progress Notes (Signed)
[facility]   Telephone:([phone]) [phone] [phone]) [phone]   Clinic Follow up Note   Patient Care Team: Willey Blade, MD as PCP - General (Internal Medicine) Sharyne Peach, MD as Consulting Physician (Ophthalmology) Lafayette Dragon, MD (Inactive) as Consulting Physician (Gastroenterology) Armandina Gemma, MD as Consulting Physician (General Surgery) Rockwell Germany, RN as Oncology Nurse Navigator Mauro Kaufmann, RN as Oncology Nurse Navigator Jovita Kussmaul, MD as Consulting Physician (General Surgery) Truitt Merle, MD as Consulting Physician (Hematology) Kyung Rudd, MD as Consulting Physician (Radiation Oncology)  Date of Service:  11/20/2020  CHIEF COMPLAINT: f/u of left breast cancer  CURRENT THERAPY:  Tamoxifen 24m once daily starting May 2022.   ASSESSMENT & PLAN:  RCHANTEL TETIis a 87y.o. female with   1.   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 She underwent left lumpectomy on 06/17/20. Surgical path showed <57m15mf microinvasive carcinoma, otherwise showed high grade DICS and clear margins.  -no radiation recommended given her age and only microinvasion -She is tolerating tamoxifen very well, no noticeable side effects.  Will continue for 5 years -Lab and follow-up in 6 months. -Continue annual mammogram   2. Osteopenia  -Her 12/2017 DEXA showed osteopenia with lowest T-score -2 at [facility].  -She will proceed with Tamoxifen which can help strengthen her bones.    3. Comorbidities: Spinal stenosis, PAD, HTN, HLD, borderline DM -Her spinal stenosis requires she ambulate with walker. She does have occasional back pain. She has some right hip pain as well.  -Her medications were recently altered by her PCP in April 2022 and instructed to change her diet.  -We discussed  her that tamoxifen may increase her blood glucose and cholesterol, follow-up with PCP.     PLAN:  -continue tamoxifen  -Lab and F/u 6 months    No problem-specific Assessment & Plan notes found for this encounter.   SUMMARY OF ONCOLOGIC HISTORY: Oncology History Overview Note  Cancer Staging Ductal carcinoma in situ (DCIS) of left breast Staging form: Breast, AJCC 8th Edition - Clinical stage from 05/13/2020: Stage 0 (cTis (DCIS), cN0, cM0, G3, ER+, PR+, HER2: Not Assessed) - Signed by FenTruitt MerleD on 05/19/2020 Stage prefix: Initial diagnosis    Malignant neoplasm of upper-inner quadrant of left breast in female, estrogen receptor positive ([facility]/20[facility] Cancer Staging   Staging form: Breast, AJCC 8th Edition - Clinical stage from 05/13/2020: Stage 0 (cTis (DCIS), cN0, cM0, G3, ER+, PR+, HER2: Not Assessed) - Signed by FenTruitt MerleD on 05/19/2020 Stage prefix: Initial diagnosis   05/13/2020 Mammogram    IMPRESSION:  1.1 x 1.0 x 0.9 cm mass in the 9:30 o'clock position of the left breast 5 cm from the nipple with imaging features suspicious for malignancy.    05/13/2020 Initial Biopsy   Diagnosis Breast, left, needle core biopsy, 9:30 O'CLOCK left breast AMENDED DIAGNOSIS: - HIGH GRADE DUCTAL CARCINOMA IN SITU WITH CENTRAL NECROSIS AND CALCIFICATIONS. - FOCUS SUSPICIOUS FOR MICROINVASION. Microscopic Comment E-cadherin is positive supporting a ductal phenotype. In performing E-cadherin, subsequent leveling revealed a focus suspicious for microinvasion. Ancillary studies will be reported separately. Revised results [facility] to [facility] 05/15/2020. Dr. ManTresa Mooreviewed.   05/13/2020 Receptors her2   Results: IMMUNOHISTOCHEMICAL AND MORPHOMETRIC ANALYSIS PERFORMED MANUALLY Estrogen Receptor: 95%,  POSITIVE, STRONG STAINING INTENSITY Progesterone Receptor: 95%, POSITIVE, STRONG STAINING INTENSITY   05/18/2020 Initial Diagnosis   Ductal carcinoma in  situ (DCIS) of left breast   06/10/2020 Genetic Testing   Negative genetic testing:  No pathogenic variants detected on the [employer] CancerNext-Expanded + RNAinsight panel. Two variants of uncertain significance (VUS) were detected - one in the BRCA1 gene called c.4735C>G (p.P1579A) and a second in the BRCA2 gene called c.1741T>C (p.S581P). The report date is 06/10/2020.  The CancerNext-Expanded + RNAinsight gene panel offered by [employer] and includes sequencing and rearrangement analysis for the following 77 genes: AIP, ALK, APC, ATM, AXIN2, BAP1, BARD1, BLM, BMPR1A, BRCA1, BRCA2, BRIP1, CDC73, CDH1, CDK4, CDKN1B, CDKN2A, CHEK2, CTNNA1, DICER1, FANCC, FH, FLCN, GALNT12, KIF1B, LZTR1, MAX, MEN1, MET, MLH1, MSH2, MSH3, MSH6, MUTYH, NBN, NF1, NF2, NTHL1, PALB2, PHOX2B, PMS2, POT1, PRKAR1A, PTCH1, PTEN, RAD51C, RAD51D, RB1, RECQL, RET, SDHA, SDHAF2, SDHB, SDHC, SDHD, SMAD4, SMARCA4, SMARCB1, SMARCE1, STK11, SUFU, TMEM127, TP53, TSC1, TSC2, VHL and XRCC2 (sequencing and deletion/duplication); EGFR, EGLN1, HOXB13, KIT, MITF, PDGFRA, POLD1 and POLE (sequencing only); EPCAM and GREM1 (deletion/duplication only). RNA data is routinely analyzed for use in variant interpretation for all genes.    06/17/2020 Cancer Staging   Staging form: Breast, AJCC 8th Edition - Pathologic stage from 06/17/2020: Stage Unknown (pT87m, pNX, cM0, G2, ER+, PR+, HER2: Not Assessed) - Signed by FTruitt Merle MD on 07/26/2020 Stage prefix: Initial diagnosis Histologic grading system: 3 grade system Residual tumor (R): R0 - None   06/17/2020 Surgery   LEFT BREAST LUMPECTOMY WITH RADIOACTIVE SEED LOCALIZATION by Dr TMarlou Starks  06/17/2020 Pathology Results   FINAL MICROSCOPIC DIAGNOSIS:   A. BREAST, LEFT, LUMPECTOMY:  - Focus of microinvasive carcinoma (<1 mm) arising in association with  high-grade ductal carcinoma in situ, see comment  - Resection margins are negative; the superior margin is 0.2 cm from  focus of microinvasive carcinoma  and DCIS  - Biopsy site changes  - Fibrocystic change with calcifications  - See oncology table      INTERVAL HISTORY:  RZAIYAH SOTTILEis here for a follow up of breast cancer. She was last seen by me on 07/24/20. She presents to the clinic accompanied by her daughter.  She came in with a walker.  She has been on tamoxifen for 3 months, tolerating very well without any noticeable side effects.  She denies hot flashes, her weight is stable.   All other systems were reviewed with the patient and are negative.  MEDICAL HISTORY:  Past Medical History:  Diagnosis Date   Abnormal EKG    Asthma    Carotid artery occlusion    DDD (degenerative disc disease)    Diabetes mellitus without complication ([facility]    Family history of breast cancer    Family history of GI tract cancer    Family history of prostate cancer    Hyperlipidemia    Hypertension    PAD (peripheral artery disease) ([facility])    Right lower quadrant abdominal abscess ([facility])    SBO ([place] obstruction) ([facility] 03/15/2014   Spinal stenosis    Type II or unspecified type diabetes mellitus without mention of complication, not stated as uncontrolled 03/22/2013   Vitamin D deficiency     SURGICAL HISTORY: Past Surgical History:  Procedure Laterality Date   BREAST LUMPECTOMY WITH RADIOACTIVE SEED LOCALIZATION Left 06/17/2020   Procedure: LEFT BREAST LUMPECTOMY WITH RADIOACTIVE SEED LOCALIZATION;  Surgeon: TJovita Kussmaul MD;  [facility]  Service: General;  Laterality: Left;   CHOLECYSTECTOMY     CYST EXCISION  back   HERNIA REPAIR     LAPAROSCOPIC APPENDECTOMY N/A 11/15/2014   Procedure: APPENDECTOMY LAPAROSCOPIC;  Surgeon: Michael Boston, MD;  Location: [facility];  Service: General;  Laterality: N/A;   MINI-LAPAROTOMY W/ TUBAL LIGATION  1964    I have reviewed the social history and family history with the patient and they are unchanged from previous note.  ALLERGIES:  is allergic to ace inhibitors,  metformin and related, penicillins, grapefruit extract, and minoxidil.  MEDICATIONS:  Current Outpatient Medications  Medication Sig Dispense Refill   amLODipine (NORVASC) 10 MG tablet Take 10 mg by mouth daily.     atenolol (TENORMIN) 100 MG tablet TAKE ONE TABLET BY MOUTH DAILY *PLEASE SCHEDULE F/U APPOINTMENT TO OBTAIN FURTHER REFILLS* 30 tablet 0   Cholecalciferol (VITAMIN D-3) 1000 UNITS CAPS Take 2,000 Units by mouth daily.      cilostazol (PLETAL) 50 MG tablet TAKE ONE TABLET BY MOUTH TWICE A DAY 180 tablet 0   Coenzyme Q10 200 MG capsule Take 200 mg by mouth daily.     Cyanocobalamin (VITAMIN B 12 PO) Take 500 mcg by mouth daily.     diltiazem (CARDIZEM) 120 MG tablet TAKE ONE TABLET BY MOUTH DAILY 90 tablet 1   Flaxseed, Linseed, (FLAXSEED OIL) 1000 MG CAPS Take 1,000 mg by mouth 4 (four) times daily.     Lancets (FREESTYLE) lancets Test glucose 1 time daily 100 each 1   Magnesium Oxide (MAG-OX PO) Take 1 tablet by mouth daily.      Multiple Vitamin (MULTIVITAMIN ADULT PO) Take 1 tablet by mouth daily at 6 (six) AM.     oxybutynin (DITROPAN) 5 MG tablet Take 5 mg by mouth 2 (two) times daily.     tamoxifen (NOLVADEX) 20 MG tablet Take 1 tablet (20 mg total) by mouth daily. 90 tablet 3   valsartan-hydrochlorothiazide (DIOVAN-HCT) 160-25 MG tablet TAKE ONE TABLET BY MOUTH DAILY 90 tablet 1   vitamin C (ASCORBIC ACID) 250 MG tablet Take 250 mg by mouth daily.     No current facility-administered medications for this visit.    PHYSICAL EXAMINATION: ECOG PERFORMANCE STATUS: 1 - Symptomatic but completely ambulatory  Vitals:   11/20/20 1620  BP: (!) 158/76  Pulse: 70  Resp: 17  Temp: 98.3 F (36.8 C)  SpO2: 96%   Wt Readings from Last 3 Encounters:  11/20/20 162 lb 12.8 oz (73.8 kg)  09/03/20 162 lb (73.5 kg)  07/24/20 161 lb (73 kg)   GENERAL:alert, no distress and comfortable SKIN: skin color, texture, turgor are normal, no rashes or significant lesions EYES: normal,  Conjunctiva are pink and non-injected, sclera clear Musculoskeletal:no cyanosis of digits and no clubbing  NEURO: alert & oriented x 3 with fluent speech, no focal motor/sensory deficits BREAST exam deferred   LABORATORY DATA:  I have reviewed the data as listed CBC Latest Ref Rng & Units 11/20/2020 07/13/2020 05/20/2020  WBC 4.0 - 10.5 K/uL 3.8(L) 4.8 3.8(L)  Hemoglobin 12.0 - 15.0 g/dL 12.8 11.2(L) 12.3  Hematocrit 36.0 - 46.0 % 38.7 33.6(L) 36.8  Platelets 150 - 400 K/uL 251 248 255     CMP Latest Ref Rng & Units 11/20/2020 09/03/2020 07/13/2020  Glucose 70 - 99 mg/dL 147(H) - 114(H)  BUN 8 - 23 mg/dL 24(H) - 23  Creatinine 0.44 - 1.00 mg/dL 1.25(H) - 0.82  Sodium 135 - 145 mmol/L 140 - 136  Potassium  3.5 - 5.1 mmol/L 4.7 - 3.9  Chloride 98 - 111 mmol/L 102 - 100  CO2 22 - 32 mmol/L 26 - 30  Calcium 8.9 - 10.3 mg/dL 10.1 - 9.2  Total Protein 6.5 - 8.1 g/dL 8.8(H) 9.0(H) 7.4  Total Bilirubin 0.3 - 1.2 mg/dL 0.3 0.4 0.4  Alkaline Phos 38 - 126 U/L 54 68 227(H)  AST 15 - 41 U/L 20 28 304(H)  ALT 0 - 44 U/L 13 18 233(H)      RADIOGRAPHIC STUDIES: I have personally reviewed the radiological images as listed and agreed with the findings in the report. No results found.    Orders Placed This Encounter  Procedures   MM DIAG BREAST TOMO BILATERAL    Standing Status:   Future    Standing Expiration Date:   11/20/2021    Order Specific Question:   Reason for Exam (SYMPTOM  OR DIAGNOSIS REQUIRED)    Answer:   screening    Order Specific Question:   Preferred imaging location?    Answer:   [facility]   All questions were answered. The patient knows to call the clinic with any problems, questions or concerns. No barriers to learning was detected. The total time spent in the appointment was 25 minutes.     Truitt Merle, MD 11/20/2020   I, Wilburn Mylar, am acting as scribe for Truitt Merle, MD.   I have reviewed the above documentation for accuracy and completeness, and I agree with  the above.

## 2020-12-12 ENCOUNTER — Emergency Department (HOSPITAL_COMMUNITY): Payer: Medicare HMO

## 2020-12-12 ENCOUNTER — Other Ambulatory Visit: Payer: Self-pay

## 2020-12-12 ENCOUNTER — Emergency Department (HOSPITAL_COMMUNITY)
Admission: EM | Admit: 2020-12-12 | Discharge: 2020-12-12 | Disposition: A | Discharge status: Home / Self Care | Payer: Medicare HMO | Attending: Emergency Medicine | Admitting: Emergency Medicine

## 2020-12-12 ENCOUNTER — Encounter (HOSPITAL_COMMUNITY): Payer: Self-pay

## 2020-12-12 DIAGNOSIS — R17 Unspecified jaundice: Secondary | ICD-10-CM | POA: Diagnosis not present

## 2020-12-12 DIAGNOSIS — R7401 Elevation of levels of liver transaminase levels: Secondary | ICD-10-CM | POA: Insufficient documentation

## 2020-12-12 DIAGNOSIS — N182 Chronic kidney disease, stage 2 (mild): Secondary | ICD-10-CM | POA: Diagnosis not present

## 2020-12-12 DIAGNOSIS — U071 COVID-19: Secondary | ICD-10-CM | POA: Diagnosis not present

## 2020-12-12 DIAGNOSIS — R1110 Vomiting, unspecified: Secondary | ICD-10-CM | POA: Diagnosis present

## 2020-12-12 DIAGNOSIS — Z853 Personal history of malignant neoplasm of breast: Secondary | ICD-10-CM | POA: Diagnosis not present

## 2020-12-12 DIAGNOSIS — E1122 Type 2 diabetes mellitus with diabetic chronic kidney disease: Secondary | ICD-10-CM | POA: Diagnosis not present

## 2020-12-12 DIAGNOSIS — N390 Urinary tract infection, site not specified: Secondary | ICD-10-CM | POA: Insufficient documentation

## 2020-12-12 DIAGNOSIS — I129 Hypertensive chronic kidney disease with stage 1 through stage 4 chronic kidney disease, or unspecified chronic kidney disease: Secondary | ICD-10-CM | POA: Insufficient documentation

## 2020-12-12 DIAGNOSIS — R7989 Other specified abnormal findings of blood chemistry: Secondary | ICD-10-CM

## 2020-12-12 DIAGNOSIS — J45909 Unspecified asthma, uncomplicated: Secondary | ICD-10-CM | POA: Insufficient documentation

## 2020-12-12 LAB — CBC
HCT: 39.2 % (ref 36.0–46.0)
Hemoglobin: 13.1 g/dL (ref 12.0–15.0)
MCH: 31.1 pg (ref 26.0–34.0)
MCHC: 33.4 g/dL (ref 30.0–36.0)
MCV: 93.1 fL (ref 80.0–100.0)
Platelets: 210 10*3/uL (ref 150–400)
RBC: 4.21 MIL/uL (ref 3.87–5.11)
RDW: 14.9 % (ref 11.5–15.5)
WBC: 6.2 10*3/uL (ref 4.0–10.5)
nRBC: 0 % (ref 0.0–0.2)

## 2020-12-12 LAB — RESP PANEL BY RT-PCR (FLU A&B, COVID) ARPGX2
Influenza A by PCR: NEGATIVE
Influenza B by PCR: NEGATIVE
SARS Coronavirus 2 by RT PCR: POSITIVE — AB

## 2020-12-12 LAB — COMPREHENSIVE METABOLIC PANEL
ALT: 214 U/L — ABNORMAL HIGH (ref 0–44)
AST: 321 U/L — ABNORMAL HIGH (ref 15–41)
Albumin: 3.8 g/dL (ref 3.5–5.0)
Alkaline Phosphatase: 148 U/L — ABNORMAL HIGH (ref 38–126)
Anion gap: 11 (ref 5–15)
BUN: 15 mg/dL (ref 8–23)
CO2: 27 mmol/L (ref 22–32)
Calcium: 10.2 mg/dL (ref 8.9–10.3)
Chloride: 103 mmol/L (ref 98–111)
Creatinine, Ser: 0.87 mg/dL (ref 0.44–1.00)
GFR, Estimated: >60 mL/min (ref >60)
Glucose, Bld: 163 mg/dL — ABNORMAL HIGH (ref 70–99)
Potassium: 3.5 mmol/L (ref 3.5–5.1)
Sodium: 141 mmol/L (ref 135–145)
Total Bilirubin: 2.5 mg/dL — ABNORMAL HIGH (ref 0.3–1.2)
Total Protein: 8.7 g/dL — ABNORMAL HIGH (ref 6.5–8.1)

## 2020-12-12 LAB — URINALYSIS, ROUTINE W REFLEX MICROSCOPIC
Bilirubin Urine: NEGATIVE
Glucose, UA: NEGATIVE mg/dL
Ketones, ur: NEGATIVE mg/dL
Nitrite: NEGATIVE
Protein, ur: 100 mg/dL — AB
Specific Gravity, Urine: 1.02 (ref 1.005–1.030)
WBC, UA: >50 WBC/hpf — ABNORMAL HIGH (ref 0–5)
pH: 7 (ref 5.0–8.0)

## 2020-12-12 LAB — LIPASE, BLOOD: Lipase: 27 U/L (ref 11–51)

## 2020-12-12 MED ORDER — SODIUM CHLORIDE (PF) 0.9 % IJ SOLN
INTRAMUSCULAR | Status: AC
  Filled 2020-12-12: qty 50

## 2020-12-12 MED ORDER — SODIUM CHLORIDE 0.9 % IV SOLN
1.0000 g | Freq: Once | INTRAVENOUS | Status: AC
  Administered 2020-12-12: 1 g via INTRAVENOUS
  Filled 2020-12-12: qty 10

## 2020-12-12 MED ORDER — ONDANSETRON 4 MG PO TBDP: 4.0000 mg | ORAL_TABLET | Freq: Three times a day (TID) | ORAL | 0 refills | Status: DC | PRN

## 2020-12-12 MED ORDER — NIRMATRELVIR/RITONAVIR (PAXLOVID) TABLET (RENAL DOSING): 2.0000 | ORAL_TABLET | Freq: Two times a day (BID) | ORAL | 0 refills | Status: AC

## 2020-12-12 MED ORDER — ONDANSETRON HCL 4 MG/2ML IJ SOLN
4.0000 mg | Freq: Once | INTRAMUSCULAR | Status: AC
  Administered 2020-12-12: 4 mg via INTRAVENOUS
  Filled 2020-12-12: qty 2

## 2020-12-12 MED ORDER — ACETAMINOPHEN 325 MG PO TABS
650.0000 mg | ORAL_TABLET | Freq: Once | ORAL | Status: AC | PRN
  Administered 2020-12-12: 650 mg via ORAL
  Filled 2020-12-12: qty 2

## 2020-12-12 MED ORDER — IOHEXOL 350 MG/ML SOLN
80.0000 mL | Freq: Once | INTRAVENOUS | Status: AC | PRN
  Administered 2020-12-12: 75 mL via INTRAVENOUS

## 2020-12-12 MED ORDER — CEPHALEXIN 500 MG PO CAPS: 500.0000 mg | ORAL_CAPSULE | Freq: Four times a day (QID) | ORAL | 0 refills | Status: AC

## 2020-12-12 NOTE — ED Triage Notes (Addendum)
Patient states she has had N/V and acid reflux since last night. Patient states, "I have pressure in my abdomen and after I vomit, it feels better." Patient states, "I put my fingers in my throat and made myself vomit to make it feel better."

## 2020-12-12 NOTE — ED Provider Notes (Signed)
  Face-to-face evaluation   History: She presents for evaluation of nausea and vomiting.  She has been inducing vomiting with relief of her discomfort.  Patient describes her discomfort as "reflux."  She has had 3 episodes of vomiting in the last 24 hours, and is currently hungry.  She previously felt like this when she had a flare of her liver disease.  She is not currently taking medications for reflux.  She is being managed by gastroenterology and had imaging done, MR, 09/17/2020 I, after her recent bout no transaminitis.  Last seen by gastroenterology, 09/03/2020.  She has a history of breast cancer.  Physical exam: Elderly, alert and cooperative.  Abdomen soft nontender palpation.  Heart regular rate and rhythm without murmur.  Lungs clear anteriorly.  Sclera are nonicteric.  Medical screening examination/treatment/procedure(s) were conducted as a shared visit with non-physician practitioner(s) and myself.  I personally evaluated the patient during the encounter    Daleen Bo, MD 12/12/20 1902

## 2020-12-12 NOTE — Discharge Instructions (Addendum)
Take antibiotics as prescribed.  Take entire course, even if symptoms improve. Take Paxlovid as prescribed.  This is an antiviral that will hopefully decrease your risk of needing hospitalization. While taking Paxlovid, take 1/2 tablet of your amlodipine and half tablet of your diltiazem.  Stop taking your Pletal while on Paxlovid. Continue taking all other home medications as prescribed.  Take Tylenol as needed for fever. You Zofran as needed for nausea or vomiting. Call your GI doctor to set up a follow-up appointment for recheck of your liver enzymes. Return to the emergency room if you develop chest pain, difficulty breathing, severe abdominal pain, persistent vomiting despite medication, or any new, worsening, or concerning symptoms

## 2020-12-12 NOTE — ED Provider Notes (Signed)
[facility] DEPT Provider Note   CSN: [id number] Arrival date & time: 12/12/20  1139     History Chief Complaint  Patient presents with   Emesis   Gastroesophageal Reflux   Abdominal Pain   Fever    Kristine Burnett is a 87 y.o. female presenting for evaluation nausea, vomiting, abdominal pain.  Patient states yesterday she developed upper abdominal pain.  She states this feels similar to her reflux.  She vomited about 3 times.  Per daughter, emesis was projectile.  She felt better last night, however when she woke with this morning she had recurrent pain and threw up again x1.  No blood in her emesis.  Patient also with a low-grade fever.  No chest pain, shortness of breath, cough.  She does report urinary frequency, no dysuria or hematuria.  No abnormal bowel movements.  She does have a GI doctor with [facility].  She does not take anything for her stomach daily including medication for heartburn or nausea.  Additional history obtained from chart review.  History of asthma, diabetes, hypertension, hyperlipidemia, PAD, small bowel obstruction, right lower quadrant abdominal abscess, breast cancer on tamoxifen.   HPI     Past Medical History:  Diagnosis Date   Abnormal EKG    Asthma    Carotid artery occlusion    DDD (degenerative disc disease)    Diabetes mellitus without complication ([place])    Family history of breast cancer    Family history of GI tract cancer    Family history of prostate cancer    Hyperlipidemia    Hypertension    PAD (peripheral artery disease) ([place])    Right lower quadrant abdominal abscess ([place])    SBO (small bowel obstruction) ([place]) 03/15/2014   Spinal stenosis    Type II or unspecified type diabetes mellitus without mention of complication, not stated as uncontrolled 03/22/2013   Vitamin D deficiency     Patient Active Problem List   Diagnosis Date Noted   Genetic testing 06/10/2020   Family history of breast cancer     Family history of prostate cancer    Family history of GI tract cancer    Malignant neoplasm of upper-inner quadrant of left breast in female, estrogen receptor positive ([place]) 05/18/2020   Hepatitis    Pain    Biliary obstruction 12/31/2017   Elevated liver enzymes 12/31/2017   Acute urinary retention 11/16/2014   Recurrent appendicitis s/p lap appy 11/15/2014 11/15/2014   Hypomagnesemia 03/24/2014   Malnutrition of moderate degree ([place]) 03/19/2014   Nausea and vomiting    Hypokalemia    Diabetes mellitus type 2, controlled ([place]) 02/27/2014   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/2013   Toe fracture, right 11/28/2013   Encounter for long-term (current) use of medications 09/17/2013   Essential hypertension 03/22/2013   Hyperlipidemia 03/22/2013   Vitamin D deficiency 03/22/2013   Extrinsic asthma, unspecified 03/22/2013    Past Surgical History:  Procedure Laterality Date   BREAST LUMPECTOMY WITH RADIOACTIVE SEED LOCALIZATION Left 06/17/2020   Procedure: LEFT BREAST LUMPECTOMY WITH RADIOACTIVE SEED LOCALIZATION;  Surgeon: Jovita Kussmaul, MD;  Location: [employer];  Service: General;  Laterality: Left;   CHOLECYSTECTOMY     CYST EXCISION  back   HERNIA REPAIR     LAPAROSCOPIC APPENDECTOMY N/A 11/15/2014   Procedure: APPENDECTOMY LAPAROSCOPIC;  Surgeon: Michael Boston, MD;  Location: [facility];  Service: General;  Laterality: N/A;  MINI-LAPAROTOMY W/ TUBAL LIGATION  1964     OB History   No obstetric history on file.     Family History  Problem Relation Age of Onset   Heart attack Mother    Hypertension Mother    Heart attack Father    Stroke Sister    Breast cancer Sister    Breast cancer Sister    Colon cancer Sister    Breast cancer Sister    Prostate cancer Brother    Heart attack Brother        x 2   Cancer Maternal Grandmother        Gastrointestinal, dx >50   Stroke Paternal Grandmother    Stroke Paternal Grandfather    Breast  cancer Cousin        maternal first cousin    Social History   Tobacco Use   Smoking status: Never   Smokeless tobacco: Never  Vaping Use   Vaping Use: Never used  Substance Use Topics   Alcohol use: No    Alcohol/week: 0.0 standard drinks   Drug use: No    Home Medications Prior to Admission medications   Medication Sig Start Date End Date Taking? Authorizing Provider  cephALEXin (KEFLEX) 500 MG capsule Take 1 capsule (500 mg total) by mouth 4 (four) times daily for 5 days. 12/12/20 12/17/20 Yes Mikaiya Tramble, PA-C  nirmatrelvir/ritonavir EUA, renal dosing, (PAXLOVID) 10 x 150 MG & 10 x 100MG  TABS Take 2 tablets by mouth 2 (two) times daily for 5 days. Patient GFR is 51. Take nirmatrelvir (150 mg) one tablet twice daily for 5 days and ritonavir (100 mg) one tablet twice daily for 5 days. 12/12/20 12/17/20 Yes Jyaire Koudelka, PA-C  ondansetron (ZOFRAN ODT) 4 MG disintegrating tablet Take 1 tablet (4 mg total) by mouth every 8 (eight) hours as needed for nausea or vomiting. 12/12/20  Yes Alona Danford, PA-C  amLODipine (NORVASC) 10 MG tablet Take 10 mg by mouth daily. 11/08/17   [provider]  atenolol (TENORMIN) 100 MG tablet TAKE ONE TABLET BY MOUTH DAILY *PLEASE SCHEDULE F/U APPOINTMENT TO OBTAIN FURTHER REFILLS* 01/27/20   Patwardhan, Reynold Bowen, MD  Cholecalciferol (VITAMIN D-3) 1000 UNITS CAPS Take 2,000 Units by mouth daily.     [provider]  cilostazol (PLETAL) 50 MG tablet TAKE ONE TABLET BY MOUTH TWICE A DAY 05/28/19   Patwardhan, Manish J, MD  Coenzyme Q10 200 MG capsule Take 200 mg by mouth daily.    [provider]  Cyanocobalamin (VITAMIN B 12 PO) Take 500 mcg by mouth daily.    [provider]  diltiazem (CARDIZEM) 120 MG tablet TAKE ONE TABLET BY MOUTH DAILY 05/17/19   Patwardhan, Manish J, MD  Flaxseed, Linseed, (FLAXSEED OIL) 1000 MG CAPS Take 1,000 mg by mouth 4 (four) times daily.    [provider]  Lancets  (FREESTYLE) lancets Test glucose 1 time daily 08/16/13   Unk Pinto, MD  Magnesium Oxide (MAG-OX PO) Take 1 tablet by mouth daily.     [provider]  Multiple Vitamin (MULTIVITAMIN ADULT PO) Take 1 tablet by mouth daily at 6 (six) AM.    [provider]  oxybutynin (DITROPAN) 5 MG tablet Take 5 mg by mouth 2 (two) times daily. 12/22/17   [provider]  tamoxifen (NOLVADEX) 20 MG tablet Take 1 tablet (20 mg total) by mouth daily. 11/20/20   Truitt Merle, MD  valsartan-hydrochlorothiazide (DIOVAN-HCT) 160-25 MG tablet TAKE ONE TABLET BY MOUTH  DAILY 05/08/19   Patwardhan, Reynold Bowen, MD  vitamin C (ASCORBIC ACID) 250 MG tablet Take 250 mg by mouth daily.    [provider]    Allergies    Ace inhibitors, Metformin and related, Penicillins, Grapefruit extract, and Minoxidil  Review of Systems   Review of Systems  Constitutional:  Positive for fever.  Gastrointestinal:  Positive for abdominal pain, nausea and vomiting.  Genitourinary:  Positive for frequency.  All other systems reviewed and are negative.  Physical Exam Updated Vital Signs BP (!) 146/116   Pulse 89   Temp 98.6 F (37 C) (Oral)   Resp 20   Ht 5\' 6"  (1.676 m)   Wt 74.8 kg   SpO2 97%   BMI 26.63 kg/m   Physical Exam Vitals and nursing note reviewed.  Constitutional:      General: She is not in acute distress.    Appearance: Normal appearance.     Comments: Resting in the bed in NAD  HENT:     Head: Normocephalic and atraumatic.  Eyes:     Conjunctiva/sclera: Conjunctivae normal.     Pupils: Pupils are equal, round, and reactive to light.  Cardiovascular:     Rate and Rhythm: Normal rate and regular rhythm.     Pulses: Normal pulses.  Pulmonary:     Effort: Pulmonary effort is normal. No respiratory distress.     Breath sounds: Normal breath sounds. No wheezing.     Comments: Speaking in full sentences.  Clear lung sounds in all fields. Abdominal:     General: There is no  distension.     Palpations: Abdomen is soft. There is no mass.     Tenderness: There is no abdominal tenderness. There is no guarding or rebound.     Comments: No ttp on my evaluation  Musculoskeletal:        General: Normal range of motion.     Cervical back: Normal range of motion and neck supple.  Skin:    General: Skin is warm and dry.     Capillary Refill: Capillary refill takes less than 2 seconds.  Neurological:     Mental Status: She is alert and oriented to person, place, and time.  Psychiatric:        Mood and Affect: Mood and affect normal.        Speech: Speech normal.        Behavior: Behavior normal.    ED Results / Procedures / Treatments   Labs (all labs ordered are listed, but only abnormal results are displayed) Labs Reviewed  RESP PANEL BY RT-PCR (FLU A&B, COVID) ARPGX2 - Abnormal; Notable for the following components:      Result Value   SARS Coronavirus 2 by RT PCR POSITIVE (*)    All other components within normal limits  COMPREHENSIVE METABOLIC PANEL - Abnormal; Notable for the following components:   Glucose, Bld 163 (*)    Total Protein 8.7 (*)    AST 321 (*)    ALT 214 (*)    Alkaline Phosphatase 148 (*)    Total Bilirubin 2.5 (*)    All other components within normal limits  URINALYSIS, ROUTINE W REFLEX MICROSCOPIC - Abnormal; Notable for the following components:   APPearance CLOUDY (*)    Hgb urine dipstick SMALL (*)    Protein, ur 100 (*)    Leukocytes,Ua MODERATE (*)    WBC, UA >50 (*)    Bacteria, UA RARE (*)  All other components within normal limits  LIPASE, BLOOD  CBC    EKG None  Radiology DG Chest 2 View  Result Date: 12/12/2020 CLINICAL DATA:  87 year old female with history of fever. EXAM: CHEST - 2 VIEW COMPARISON:  07/13/2020 FINDINGS: Cardiomediastinal silhouette is within normal limits. The lungs are clear bilaterally without evidence of focal consolidation, pleural effusion, or pneumothorax. No acute osseous  abnormality. IMPRESSION: No acute cardiopulmonary process. Electronically Signed   By: Ruthann Cancer M.D.   On: 12/12/2020 13:12   CT ABDOMEN PELVIS W CONTRAST  Result Date: 12/12/2020 CLINICAL DATA:  Patient states she has had N/V and acid reflux since last night. Patient states, "I have pressure in my abdomen and after I vomit, it feels better." EXAM: CT ABDOMEN AND PELVIS WITH CONTRAST TECHNIQUE: Multidetector CT imaging of the abdomen and pelvis was performed using the standard protocol following bolus administration of intravenous contrast. CONTRAST:  68mL OMNIPAQUE IOHEXOL 350 MG/ML SOLN COMPARISON:  07/13/2020 FINDINGS: Lower chest: No acute findings. Chronic right lower lobe bronchiectasis. Hepatobiliary: Liver normal in overall size. No liver mass or focal lesion. Status post cholecystectomy. There is intra and extrahepatic bile duct dilation, common bile duct measuring a maximum of 1.4 cm, common duct less dilated than on the prior CT. This finding was further assessed with MRI, MRCP on 09/17/2020. Pancreas: Unremarkable. No pancreatic ductal dilatation or surrounding inflammatory changes. Spleen: Normal in size without focal abnormality. Adrenals/Urinary Tract: No adrenal masses. Stomach/Bowel: Kidneys normal in size, orientation and position. Bilateral low-attenuation renal masses, more prominent and numerous on the right, stable consistent with cysts. No stones. No hydronephrosis. Ureters and bladder are unremarkable. Vascular/Lymphatic: Anastomotic staple line along the inferior margin the cecum, stable. Bowel is normal in caliber. No wall thickening or inflammation. Normal stomach. Reproductive: Normal uterus. No adnexal masses. There are prominent periuterine and gonadal veins merging with the gonadal veins. This appearance is similar to the prior CT. Other: No hernia.  No ascites. Musculoskeletal: No fracture or acute finding. No bone lesion. Significant degenerative changes of the lumbar spine.  IMPRESSION: 1. No acute findings within the abdomen or pelvis. No bowel obstruction or inflammation. 2. Chronic intra and extrahepatic bile duct dilation, common bile duct less dilated than on the prior CT. 3. Aortic atherosclerosis. Electronically Signed   By: Lajean Manes M.D.   On: 12/12/2020 15:17   US Abdomen Limited RUQ (LIVER/GB)  Result Date: 12/12/2020 CLINICAL DATA:  Elevated LFTs EXAM: ULTRASOUND ABDOMEN LIMITED RIGHT UPPER QUADRANT COMPARISON:  None. FINDINGS: Gallbladder: Status post cholecystectomy. Common bile duct: Diameter: 3 mm Liver: No focal lesion identified. Within normal limits in parenchymal echogenicity. Portal vein is patent on color Doppler imaging with normal direction of blood flow towards the liver. Other: None. IMPRESSION: Status post cholecystectomy. Otherwise unremarkable right upper quadrant ultrasound. Electronically Signed   By: Eddie Candle M.D.   On: 12/12/2020 14:33    Procedures Procedures   Medications Ordered in ED Medications  sodium chloride (PF) 0.9 % injection (has no administration in time range)  acetaminophen (TYLENOL) tablet 650 mg (650 mg Oral Given 12/12/20 1309)  ondansetron (ZOFRAN) injection 4 mg (4 mg Intravenous Given 12/12/20 1307)  iohexol (OMNIPAQUE) 350 MG/ML injection 80 mL (75 mLs Intravenous Contrast Given 12/12/20 1435)  cefTRIAXone (ROCEPHIN) 1 g in sodium chloride 0.9 % 100 mL IVPB (1 g Intravenous New Bag/Given 12/12/20 1621)    ED Course  I have reviewed the triage vital signs and the nursing notes.  Pertinent  labs & imaging results that were available during my care of the patient were reviewed by me and considered in my medical decision making (see chart for details).    MDM Rules/Calculators/A&P                           Patient presenting for evaluation of abdominal pain, nausea, vomiting.  On exam, patient appears nontoxic.  Pain is resolved, and her abdominal exam is overall reassuring.  However she is febrile with a  temperature of 100.5.  Considering her abdominal symptoms and fever, will obtain labs and CT abdomen pelvis with further evaluation.  Will obtain urine and chest x-ray to look for source of fever.  Labs interpreted by me, shows elevated LFTs and bili.  Patient has had this in the past, however in the setting of fever and abdominal symptoms, she will need further GI evaluation.  Ultrasound added on.  Urine consistent with infection.  Of note, patient's COVID test also came back positive.  This could be the cause of LFT elevation. Pt's sister is currently hospitalized with covid.   Repeat abdominal exam shows no abdominal tenderness.  Discussed with Dr. Carlean Purl for lower GI.  He feels in the setting of no abdominal pain and with other sources of patient's fever, patient does not need emergent interventions from a GI point of view at this time.  Discussed findings and plan with patient and daughter, who are agreeable.  Discussed Paxlovid dosing and patient's home medications with pharmacy, who recommends half dose of amlodipine and Dilt, as well as stopping the Pletal.  Recommends renal dosing for Paxlovid.  Strict return precautions given.  At this time, patient appears safe for discharge.  Return precautions given.  Patient and daughter state they understand and agree to plan.   Final Clinical Impression(s) / ED Diagnoses Final diagnoses:  Elevated LFTs  Urinary tract infection without hematuria, site unspecified  COVID-19    Rx / DC Orders ED Discharge Orders          Ordered    ondansetron (ZOFRAN ODT) 4 MG disintegrating tablet  Every 8 hours PRN        12/12/20 1644    nirmatrelvir/ritonavir EUA, renal dosing, (PAXLOVID) 10 x 150 MG & 10 x 100MG  TABS  2 times daily        12/12/20 1644    cephALEXin (KEFLEX) 500 MG capsule  4 times daily        12/12/20 1645             Shaylynn Nulty, PA-C 12/12/20 1652    Daleen Bo, MD 12/12/20 1902

## 2020-12-14 ENCOUNTER — Telehealth: Payer: Self-pay

## 2020-12-14 DIAGNOSIS — R748 Abnormal levels of other serum enzymes: Secondary | ICD-10-CM

## 2020-12-14 NOTE — Telephone Encounter (Signed)
Spoke with patient, she states that she is "going through" right now and has a lot of coughing. Advised patient that once she is cleared and out of quarantine she can go by the lab at her convenience to have labs drawn. Pt is already scheduled for a follow up on Monday, 12/21/20 at 8:10 am. Pt verbalized understanding and had no concerns at the end of the call.  Lab order in epic.

## 2020-12-14 NOTE — Telephone Encounter (Signed)
Havery Moros Carlota Raspberry, MD  Yevette Edwards, RN [place] can you contact this patient and see how she is doing?  Looks like she has COVID but her liver enzymes were elevated. Would recommend she come in to the lab when she can to have LFTs done and see if this has improved / stable and can you help book a follow up office visit with me. Ideally would have her LFTs checked in the next 1-2 days but not sure when she would be cleared to come in for lab draw in light of recent COVID dx. Thanks

## 2020-12-21 ENCOUNTER — Ambulatory Visit: Payer: Medicare HMO | Admitting: Gastroenterology

## 2021-02-16 ENCOUNTER — Ambulatory Visit (INDEPENDENT_AMBULATORY_CARE_PROVIDER_SITE_OTHER): Payer: Medicare HMO | Admitting: Gastroenterology

## 2021-02-16 ENCOUNTER — Telehealth: Payer: Self-pay

## 2021-02-16 ENCOUNTER — Other Ambulatory Visit: Payer: Medicare HMO

## 2021-02-16 ENCOUNTER — Encounter: Payer: Self-pay | Admitting: Gastroenterology

## 2021-02-16 VITALS — BP 152/70 | HR 60 | Ht 66.0 in | Wt 166.0 lb

## 2021-02-16 DIAGNOSIS — R932 Abnormal findings on diagnostic imaging of liver and biliary tract: Secondary | ICD-10-CM

## 2021-02-16 DIAGNOSIS — R748 Abnormal levels of other serum enzymes: Secondary | ICD-10-CM | POA: Diagnosis not present

## 2021-02-16 LAB — HEPATIC FUNCTION PANEL
ALT: 10 U/L (ref 0–35)
AST: 19 U/L (ref 0–37)
Albumin: 4 g/dL (ref 3.5–5.2)
Alkaline Phosphatase: 45 U/L (ref 39–117)
Bilirubin, Direct: 0.1 mg/dL (ref 0.0–0.3)
Total Bilirubin: 0.5 mg/dL (ref 0.2–1.2)
Total Protein: 8.3 g/dL (ref 6.0–8.3)

## 2021-02-16 NOTE — Telephone Encounter (Signed)
error 

## 2021-02-16 NOTE — Progress Notes (Signed)
HPI :  87 year old female with a history of breast cancer, diabetes, cholecystectomy, here for a follow up visit for abnormal liver imaging / biliary tree imaging and elevated liver enzymes.  Recall from prior visit that she was in her usual state of health and went to the emergency room on July 13 2020 with abdominal pain and vomiting. She states at the time of her ED evaluation she actually felt well without any symptoms.  She had labs drawn which showed that her AST was 304, ALT 233, alk phos 227, T bili 0.4.  She had a negative acute hepatitis panel and negative lipase.  In March of this year she had normal liver enzymes.  Interestingly in 2019 she also had elevated liver enzymes with transaminitis is upwards of 500s.  In 2019 she had an MRCP which showed a dilated common bile duct to 1.3 cm without obstructing mass or choledocholithiasis.  This dilation appeared stable in size dating back to 2016. In the ER she had a CT scan which did not show any acute pathology.  Her liver appeared unremarkable but there was intrahepatic and extrahepatic biliary ductal dilation approximately CBD measured 2 cm in diameter, larger than previous, there was no obvious calcified stone in the bile duct.  She had cholecystectomy done in 2010 and had an ERCP at that time for retained gallstones.   At her last visit we discussed options and we decided proceed with her surveillance MRCP.  In June of this year she had an MRCP which showed extrahepatic biliary duct dilatation moderate to severe favored chronic and benign. No choledocholithiasis. No obstructing lesion present. Postcholecystectomy anatomy. No focal hepatic lesion.  Normal hepatic liver parenchyma. Normal pancreas.  At the time of that exam her liver enzymes have normalized.  She had follow-up liver enzymes in the beginning of September which were also normal.  In late December she was brought back to the ED by her family for an episode of vomiting and reflux  symptoms.  She denies any pain at that time.  Liver enzymes were checked again and AST increased to 321, ALT 214, alk phos 148, bilirubin 2.5.  She was found to be COVID-positive at that time.  She had imaging repeated with a right upper quadrant ultrasound which did not show any concerning pathology or biliary ductal dilation, however CT scan was also obtained which showed intra and extrahepatic biliary ductal dilation maximum diameter of 1.4 cm, less so dilated than compared to prior CT and stable compared to her prior MRI imaging.  Pancreas looks normal.  Since that time she has had no recurrence of her vomiting.  She she has been feeling well without complaints.  She denies any new changes to her medications.  She is not drinking any alcohol.  No herbal supplements.  No NSAID use.  She denies any cardiopulmonary symptoms.  Her weight is stable.  Her appetite is good.  She denies any complaints at all.  She has not had her liver enzymes repeated yet.  Last colonoscopy in 05/2008 was normal other than melanosis colis. No polyps. Sister had colon cancer, diagnosed at age 67   CT abdomen / pelvis - 07/13/20 - IMPRESSION: 1. No acute intra-abdominal or pelvic pathology. 2. Sigmoid diverticulosis. No bowel obstruction. 3. Aortic Atherosclerosis (ICD10-I70.0).   Hepatobiliary: The liver is unremarkable. There is intrahepatic and extrahepatic biliary ductal dilatation, likely post cholecystectomy. The common bile duct measures approximately 2 cm in diameter. No retained calcified stone noted in  the central CBD.     MRCP 12/2017 - IMPRESSION: 1. Significantly diminished exam detail secondary to respiratory motion artifact. 2. Chronic increase caliber of the common bile duct with mild intrahepatic biliary dilatation status post cholecystectomy. This dates back to 11/15/14. No obstructing mass or choledocholithiasis identified. If there is a clinical concern for common bile duct obstruction then further  evaluation with ERCP may be helpful. Additionally, contrast enhanced pancreas protocol CT of the abdomen may be less susceptible to respiratory motion artifact. 3. Mild hepatic steatosis. 4. Right kidney cysts, difficult to characterize due to motion artifact.      MRCP 09/17/20: IMPRESSION: 1. Extrahepatic biliary duct dilatation while moderate to severe is favored chronic and benign. No choledocholithiasis. No obstructing lesion present. Postcholecystectomy anatomy. 2. No focal hepatic lesion.  Normal hepatic liver parenchyma. 3. Normal pancreas.  RUQ [place] 12/12/20: IMPRESSION: Status post cholecystectomy. Otherwise unremarkable right upper quadrant ultrasound  CT abdomen / pelvis with contrast 12/12/20: IMPRESSION: 1. No acute findings within the abdomen or pelvis. No bowel obstruction or inflammation. 2. Chronic intra and extrahepatic bile duct dilation, common bile duct less dilated than on the prior CT. 3. Aortic atherosclerosis.   LFTs abnormal in April AST 304, ALT 233, AP 227, T bil 0.4  NORMAL LFTs June and Sept 2nd  Sept 24 2022 AST 321, ALT 214, AP 148, T bil 2.5 Tested positive for COVID at that time     Past Medical History:  Diagnosis Date   Abnormal EKG    Asthma    Carotid artery occlusion    DDD (degenerative disc disease)    Diabetes mellitus without complication ([place])    Family history of breast cancer    Family history of GI tract cancer    Family history of prostate cancer    Hyperlipidemia    Hypertension    PAD (peripheral artery disease) ([place])    Right lower quadrant abdominal abscess ([facility])    SBO (small bowel obstruction) ([place]) 03/15/2014   Spinal stenosis    Type II or unspecified type diabetes mellitus without mention of complication, not stated as uncontrolled 03/22/2013   Vitamin D deficiency      Past Surgical History:  Procedure Laterality Date   BREAST LUMPECTOMY WITH RADIOACTIVE SEED LOCALIZATION Left 06/17/2020   Procedure:  LEFT BREAST LUMPECTOMY WITH RADIOACTIVE SEED LOCALIZATION;  Surgeon: Jovita Kussmaul, MD;  Location: [facility];  Service: General;  Laterality: Left;   CHOLECYSTECTOMY     CYST EXCISION  back   HERNIA REPAIR     LAPAROSCOPIC APPENDECTOMY N/A 11/15/2014   Procedure: APPENDECTOMY LAPAROSCOPIC;  Surgeon: Michael Boston, MD;  Location: [facility];  Service: General;  Laterality: N/A;   MINI-LAPAROTOMY W/ TUBAL LIGATION  1964   Family History  Problem Relation Age of Onset   Heart attack Mother    Hypertension Mother    Heart attack Father    Stroke Sister    Breast cancer Sister    Breast cancer Sister    Colon cancer Sister    Breast cancer Sister    Prostate cancer Brother    Heart attack Brother        x 2   Cancer Maternal Grandmother        Gastrointestinal, dx >50   Stroke Paternal Grandmother    Stroke Paternal Grandfather    Breast cancer Cousin        maternal first cousin   Esophageal cancer Neg Hx    Stomach  cancer Neg Hx    Pancreatic cancer Neg Hx    Social History   Tobacco Use   Smoking status: Never   Smokeless tobacco: Never  Vaping Use   Vaping Use: Never used  Substance Use Topics   Alcohol use: No    Alcohol/week: 0.0 standard drinks   Drug use: No   Current Outpatient Medications  Medication Sig Dispense Refill   amLODipine (NORVASC) 10 MG tablet Take 10 mg by mouth daily.     atenolol (TENORMIN) 100 MG tablet TAKE ONE TABLET BY MOUTH DAILY *PLEASE SCHEDULE F/U APPOINTMENT TO OBTAIN FURTHER REFILLS* 30 tablet 0   Cholecalciferol (VITAMIN D-3) 1000 UNITS CAPS Take 2,000 Units by mouth daily.      cilostazol (PLETAL) 50 MG tablet TAKE ONE TABLET BY MOUTH TWICE A DAY 180 tablet 0   Coenzyme Q10 200 MG capsule Take 200 mg by mouth daily.     Cyanocobalamin (VITAMIN B 12 PO) Take 500 mcg by mouth daily.     diltiazem (CARDIZEM) 120 MG tablet TAKE ONE TABLET BY MOUTH DAILY 90 tablet 1   Flaxseed, Linseed, (FLAXSEED OIL) 1000 MG CAPS Take 1,000 mg  by mouth 4 (four) times daily.     Lancets (FREESTYLE) lancets Test glucose 1 time daily 100 each 1   Magnesium Oxide (MAG-OX PO) Take 1 tablet by mouth daily.      Multiple Vitamin (MULTIVITAMIN ADULT PO) Take 1 tablet by mouth daily at 6 (six) AM.     oxybutynin (DITROPAN) 5 MG tablet Take 5 mg by mouth 2 (two) times daily.     tamoxifen (NOLVADEX) 20 MG tablet Take 1 tablet (20 mg total) by mouth daily. 90 tablet 3   valsartan-hydrochlorothiazide (DIOVAN-HCT) 160-25 MG tablet TAKE ONE TABLET BY MOUTH DAILY 90 tablet 1   vitamin C (ASCORBIC ACID) 250 MG tablet Take 250 mg by mouth daily.     No current facility-administered medications for this visit.   Allergies  Allergen Reactions   Ace Inhibitors Cough    Enalapril OK   Metformin And Related Diarrhea   Penicillins Other (See Comments)    unknown Has patient had a PCN reaction causing immediate rash, facial/tongue/throat swelling, SOB or lightheadedness with hypotension: No Has patient had a PCN reaction causing severe rash involving mucus membranes or skin necrosis: No Has patient had a PCN reaction that required hospitalization: No Has patient had a PCN reaction occurring within the last 10 years: No If all of the above answers are "NO", then may proceed with Cephalosporin use.    Grapefruit Extract Rash   Minoxidil Other (See Comments)    Grow hair on face     Review of Systems: All systems reviewed and negative except where noted in HPI.    Labs per HPI  Physical Exam: BP (!) 152/70   Pulse 60   Ht '5\' 6"'  (1.676 m)   Wt 166 lb (75.3 kg)   SpO2 97%   BMI 26.79 kg/m  Constitutional: Pleasant,well-developed, female in no acute distress. Neurological: Alert and oriented to person place and time. Psychiatric: Normal mood and affect. Behavior is normal.   ASSESSMENT AND PLAN: 87 year old female here for reassessment following:  Abnormal liver enzymes Abnormal imaging of the biliary tract  Patient with dilated  biliary tree as outlined above noted on multiple imaging modalities dating back to at least 2015 although more prominent in recent years.  A few MRCP's have not shown any malignant changes, her pancreas looks okay,  no obvious ampullary lesion on imaging. Could be due to post cholecystectomy state but more prominent than normally seen with that, papillary stenosis also possible. Historically her LFTs have been normal but she has had a few episodes of presenting to the ED with vomiting and absolutely no pain, with a spike in her LFTs.  She had COVID at the time of her last hospital admission which is confounding the presentation.  Discussed with the patient and her daughter how aggressive she want to be with this workup.  Under normal circumstances with her history would consider an EUS to further evaluate this and ensure no ampullary lesion.  We discussed what that would entail if the patient wanted to pursue that or not.  After discussing options with the patient and her daughter, if the patient is feeling well without any symptoms and if her liver enzymes are normal they decline further evaluation of this.  Given the stability of this on imaging over the years the likelihood of malignancy is low although cannot say for certain without looking.  Family and patient are understanding of this and do not want to pursue EUS or further evaluation of this unless she has persistent elevation in her liver enzymes or symptoms that bother her given her age and comorbidities, which is reasonable.  We will check her LFTs today and trend.  Hopefully they have normalized.  I will let them know the results of LFTs and further recommendations based on that result.  They agreed.  Jolly Mango, MD [facility]

## 2021-02-16 NOTE — Patient Instructions (Signed)
If you are age 85 or older, your body mass index should be between 23-30. Your Body mass index is 26.79 kg/m. If this is out of the aforementioned range listed, please consider follow up with your Primary Care Provider.  If you are age 68 or younger, your body mass index should be between 19-25. Your Body mass index is 26.79 kg/m. If this is out of the aformentioned range listed, please consider follow up with your Primary Care Provider.   ________________________________________________________  The [facility] providers would like to encourage you to use [facility] to communicate with providers for non-urgent requests or questions.  Due to long hold times on the telephone, sending your provider a message by [facility] may be a faster and more efficient way to get a response.  Please allow 48 business hours for a response.  Please remember that this is for non-urgent requests.  _______________________________________________________  Please go to the lab in the basement of our building to have lab work done as you leave today. Hit "B" for basement when you get on the elevator.  When the doors open the lab is on your left.  We will call you with the results. Thank you.  Thank you for entrusting me with your care and for choosing [facility], Dr. [place] Cellar

## 2021-02-17 ENCOUNTER — Other Ambulatory Visit: Payer: Self-pay

## 2021-02-17 DIAGNOSIS — R748 Abnormal levels of other serum enzymes: Secondary | ICD-10-CM

## 2021-02-17 DIAGNOSIS — R1013 Epigastric pain: Secondary | ICD-10-CM

## 2021-02-17 DIAGNOSIS — R932 Abnormal findings on diagnostic imaging of liver and biliary tract: Secondary | ICD-10-CM

## 2021-03-16 ENCOUNTER — Other Ambulatory Visit: Payer: Self-pay

## 2021-03-16 ENCOUNTER — Ambulatory Visit: Payer: Medicare HMO | Admitting: Podiatry

## 2021-03-16 DIAGNOSIS — I739 Peripheral vascular disease, unspecified: Secondary | ICD-10-CM | POA: Diagnosis not present

## 2021-03-16 DIAGNOSIS — M79675 Pain in left toe(s): Secondary | ICD-10-CM

## 2021-03-16 DIAGNOSIS — M79674 Pain in right toe(s): Secondary | ICD-10-CM | POA: Diagnosis not present

## 2021-03-16 DIAGNOSIS — B351 Tinea unguium: Secondary | ICD-10-CM

## 2021-03-16 DIAGNOSIS — L84 Corns and callosities: Secondary | ICD-10-CM | POA: Diagnosis not present

## 2021-03-16 NOTE — Progress Notes (Signed)
Subjective:   Patient ID: Kristine Burnett, female   DOB: 87 y.o.   MRN: [id number]   HPI 87 year old female presents the office with concerns of a painful callus on her left heel as well as her nails becoming thickened discolored and elongated she cannot trim them herself.  Denies any swelling or redness or any drainage.  No open sores that she reports.  She has no other concerns.   Review of Systems  All other systems reviewed and are negative.  Past Medical History:  Diagnosis Date   Abnormal EKG    Asthma    Carotid artery occlusion    DDD (degenerative disc disease)    Diabetes mellitus without complication ([place])    Family history of breast cancer    Family history of GI tract cancer    Family history of prostate cancer    Hyperlipidemia    Hypertension    PAD (peripheral artery disease) ([facility])    Right lower quadrant abdominal abscess ([facility])    SBO (small bowel obstruction) ([place]) 03/15/2014   Spinal stenosis    Type II or unspecified type diabetes mellitus without mention of complication, not stated as uncontrolled 03/22/2013   Vitamin D deficiency     Past Surgical History:  Procedure Laterality Date   BREAST LUMPECTOMY WITH RADIOACTIVE SEED LOCALIZATION Left 06/17/2020   Procedure: LEFT BREAST LUMPECTOMY WITH RADIOACTIVE SEED LOCALIZATION;  Surgeon: Jovita Kussmaul, MD;  Location: [facility];  Service: General;  Laterality: Left;   CHOLECYSTECTOMY     CYST EXCISION  back   [place] N/A 11/15/2014   Procedure: APPENDECTOMY LAPAROSCOPIC;  Surgeon: Michael Boston, MD;  [facility];  Service: General;  Laterality: N/A;   MINI-LAPAROTOMY W/ TUBAL LIGATION  1964     Current Outpatient Medications:    amLODipine (NORVASC) 10 MG tablet, Take 10 mg by mouth daily., Disp: , Rfl:    atenolol (TENORMIN) 100 MG tablet, TAKE ONE TABLET BY MOUTH DAILY *PLEASE SCHEDULE F/U APPOINTMENT TO OBTAIN FURTHER REFILLS*, Disp: 30 tablet, Rfl: 0    Cholecalciferol (VITAMIN D-3) 1000 UNITS CAPS, Take 2,000 Units by mouth daily. , Disp: , Rfl:    cilostazol (PLETAL) 50 MG tablet, TAKE ONE TABLET BY MOUTH TWICE A DAY, Disp: 180 tablet, Rfl: 0   Coenzyme Q10 200 MG capsule, Take 200 mg by mouth daily., Disp: , Rfl:    Cyanocobalamin (VITAMIN B 12 PO), Take 500 mcg by mouth daily., Disp: , Rfl:    diltiazem (CARDIZEM) 120 MG tablet, TAKE ONE TABLET BY MOUTH DAILY, Disp: 90 tablet, Rfl: 1   Flaxseed, Linseed, (FLAXSEED OIL) 1000 MG CAPS, Take 1,000 mg by mouth 4 (four) times daily., Disp: , Rfl:    Lancets (FREESTYLE) lancets, Test glucose 1 time daily, Disp: 100 each, Rfl: 1   Magnesium Oxide (MAG-OX PO), Take 1 tablet by mouth daily. , Disp: , Rfl:    mirabegron ER (MYRBETRIQ) 25 MG TB24 tablet, Myrbetriq 25 mg tablet,extended release  Take 1 tablet every day by oral route., Disp: , Rfl:    Multiple Vitamin (MULTIVITAMIN ADULT PO), Take 1 tablet by mouth daily at 6 (six) AM., Disp: , Rfl:    oxybutynin (DITROPAN) 5 MG tablet, Take 5 mg by mouth 2 (two) times daily., Disp: , Rfl:    tamoxifen (NOLVADEX) 20 MG tablet, Take 1 tablet (20 mg total) by mouth daily., Disp: 90 tablet, Rfl: 3   valsartan-hydrochlorothiazide (DIOVAN-HCT) 160-25  MG tablet, TAKE ONE TABLET BY MOUTH DAILY, Disp: 90 tablet, Rfl: 1   vitamin C (ASCORBIC ACID) 250 MG tablet, Take 250 mg by mouth daily., Disp: , Rfl:   Allergies  Allergen Reactions   Ace Inhibitors Cough    Enalapril OK   Metformin And Related Diarrhea   Penicillins Other (See Comments)    unknown Has patient had a PCN reaction causing immediate rash, facial/tongue/throat swelling, SOB or lightheadedness with hypotension: No Has patient had a PCN reaction causing severe rash involving mucus membranes or skin necrosis: No Has patient had a PCN reaction that required hospitalization: No Has patient had a PCN reaction occurring within the last 10 years: No If all of the above answers are "NO", then may  proceed with Cephalosporin use.    Grapefruit Extract Rash   Minoxidil Other (See Comments)    Grow hair on face          Objective:  Physical Exam  General: AAO x3, NAD  Dermatological: Nails are hypertrophic, dystrophic, brittle, discolored, elongated 10. No surrounding redness or drainage. Tenderness nails 1-5 bilaterally.  Hyperkeratotic lesion left heel without any underlying ulceration triggering signs of infection.  No open lesions or other pre-ulcerative lesions are identified today.  Vascular: Dorsalis Pedis artery and Posterior Tibial artery pedal pulses are decreased bilateral with immedate capillary fill time. There is no pain with calf compression, swelling, warmth, erythema.   Neruologic: Grossly intact via light touch bilateral.  Musculoskeletal: No other areas of discomfort bilaterally.  Gait: Unassisted, Nonantalgic.       Assessment:   Symptomatic onychomycosis, hyperkeratotic lesion     Plan:  -Treatment options discussed including all alternatives, risks, and complications -Etiology of symptoms were discussed -Sharply debrided the nails x10 without any complications or bleeding. -Debrided hyperkeratotic lesions courtesy of the left heel without any complications or bleeding. -Given decreased pulses ordered arterial studies.

## 2021-03-29 ENCOUNTER — Ambulatory Visit (HOSPITAL_COMMUNITY)
Admission: RE | Admit: 2021-03-29 | Discharge: 2021-03-29 | Disposition: A | Discharge status: Home / Self Care | Payer: Medicare HMO | Source: Ambulatory Visit | Attending: Podiatry | Admitting: Podiatry

## 2021-03-29 ENCOUNTER — Other Ambulatory Visit: Payer: Self-pay

## 2021-03-29 DIAGNOSIS — I739 Peripheral vascular disease, unspecified: Secondary | ICD-10-CM | POA: Diagnosis present

## 2021-03-29 NOTE — Progress Notes (Signed)
VASCULAR LAB    ABIs have been performed.  See CV proc for preliminary results.   Naoko Diperna, RVT 03/29/2021, 8:51 AM

## 2021-04-16 ENCOUNTER — Ambulatory Visit
Admission: RE | Admit: 2021-04-16 | Discharge: 2021-04-16 | Disposition: A | Discharge status: Home / Self Care | Payer: Medicare HMO | Source: Ambulatory Visit | Attending: Hematology | Admitting: Hematology

## 2021-04-16 DIAGNOSIS — C50212 Malignant neoplasm of upper-inner quadrant of left female breast: Secondary | ICD-10-CM

## 2021-04-16 DIAGNOSIS — Z170 Estrogen receptor positive status [ER+]: Secondary | ICD-10-CM

## 2021-05-18 ENCOUNTER — Telehealth: Payer: Self-pay

## 2021-05-18 NOTE — Telephone Encounter (Signed)
-----   Message from Yevette Edwards, RN sent at 02/17/2021  9:30 AM EST ----- Regarding: Labs Hepatic function panel, the order is in epic. Call Bonnie Brae with reminder

## 2021-05-18 NOTE — Telephone Encounter (Signed)
Spoke with Langley Gauss to remind her that patient is due for repeat labs at this time. She is aware that Dr. Havery Moros wanted to recheck patient's liver enzymes about 3 months from the last time. No appointment is necessary. Langley Gauss is aware that they can stop by the lab in the basement at their convenience between 7:30 AM - 5 PM, Monday through Friday.Langley Gauss verbalized understanding and had no concerns at the end of the call.

## 2021-05-19 ENCOUNTER — Other Ambulatory Visit: Payer: Self-pay

## 2021-05-19 DIAGNOSIS — D0512 Intraductal carcinoma in situ of left breast: Secondary | ICD-10-CM

## 2021-05-19 DIAGNOSIS — C50212 Malignant neoplasm of upper-inner quadrant of left female breast: Secondary | ICD-10-CM

## 2021-05-19 DIAGNOSIS — Z170 Estrogen receptor positive status [ER+]: Secondary | ICD-10-CM

## 2021-05-20 ENCOUNTER — Other Ambulatory Visit: Payer: Self-pay

## 2021-05-20 ENCOUNTER — Inpatient Hospital Stay: Payer: Medicare HMO | Attending: Hematology | Admitting: Hematology

## 2021-05-20 ENCOUNTER — Inpatient Hospital Stay: Payer: Medicare HMO

## 2021-05-20 ENCOUNTER — Encounter: Payer: Self-pay | Admitting: Hematology

## 2021-05-20 VITALS — BP 155/76 | HR 81 | Temp 98.5°F | Resp 18 | Ht 66.0 in | Wt 160.4 lb

## 2021-05-20 DIAGNOSIS — Z803 Family history of malignant neoplasm of breast: Secondary | ICD-10-CM | POA: Insufficient documentation

## 2021-05-20 DIAGNOSIS — Z170 Estrogen receptor positive status [ER+]: Secondary | ICD-10-CM | POA: Insufficient documentation

## 2021-05-20 DIAGNOSIS — E785 Hyperlipidemia, unspecified: Secondary | ICD-10-CM | POA: Insufficient documentation

## 2021-05-20 DIAGNOSIS — M25551 Pain in right hip: Secondary | ICD-10-CM | POA: Diagnosis not present

## 2021-05-20 DIAGNOSIS — Z800 Family history of malignant neoplasm of digestive organs: Secondary | ICD-10-CM | POA: Insufficient documentation

## 2021-05-20 DIAGNOSIS — Z8042 Family history of malignant neoplasm of prostate: Secondary | ICD-10-CM | POA: Diagnosis not present

## 2021-05-20 DIAGNOSIS — C50212 Malignant neoplasm of upper-inner quadrant of left female breast: Secondary | ICD-10-CM | POA: Insufficient documentation

## 2021-05-20 DIAGNOSIS — M549 Dorsalgia, unspecified: Secondary | ICD-10-CM | POA: Insufficient documentation

## 2021-05-20 DIAGNOSIS — E2839 Other primary ovarian failure: Secondary | ICD-10-CM | POA: Diagnosis not present

## 2021-05-20 DIAGNOSIS — M8580 Other specified disorders of bone density and structure, unspecified site: Secondary | ICD-10-CM | POA: Diagnosis not present

## 2021-05-20 DIAGNOSIS — I10 Essential (primary) hypertension: Secondary | ICD-10-CM | POA: Diagnosis not present

## 2021-05-20 DIAGNOSIS — D0512 Intraductal carcinoma in situ of left breast: Secondary | ICD-10-CM

## 2021-05-20 LAB — CBC WITH DIFFERENTIAL (CANCER CENTER ONLY)
Abs Immature Granulocytes: 0.01 10*3/uL (ref 0.00–0.07)
Basophils Absolute: 0 10*3/uL (ref 0.0–0.1)
Basophils Relative: 0 %
Eosinophils Absolute: 0.2 10*3/uL (ref 0.0–0.5)
Eosinophils Relative: 3 %
HCT: 36 % (ref 36.0–46.0)
Hemoglobin: 12.2 g/dL (ref 12.0–15.0)
Immature Granulocytes: 0 %
Lymphocytes Relative: 26 %
Lymphs Abs: 1.2 10*3/uL (ref 0.7–4.0)
MCH: 31.1 pg (ref 26.0–34.0)
MCHC: 33.9 g/dL (ref 30.0–36.0)
MCV: 91.8 fL (ref 80.0–100.0)
Monocytes Absolute: 0.6 10*3/uL (ref 0.1–1.0)
Monocytes Relative: 13 %
Neutro Abs: 2.6 10*3/uL (ref 1.7–7.7)
Neutrophils Relative %: 58 %
Platelet Count: 193 10*3/uL (ref 150–400)
RBC: 3.92 MIL/uL (ref 3.87–5.11)
RDW: 14 % (ref 11.5–15.5)
WBC Count: 4.6 10*3/uL (ref 4.0–10.5)
nRBC: 0 % (ref 0.0–0.2)

## 2021-05-20 LAB — CMP (CANCER CENTER ONLY)
ALT: 63 U/L — ABNORMAL HIGH (ref 0–44)
AST: 46 U/L — ABNORMAL HIGH (ref 15–41)
Albumin: 3.8 g/dL (ref 3.5–5.0)
Alkaline Phosphatase: 141 U/L — ABNORMAL HIGH (ref 38–126)
Anion gap: 8 (ref 5–15)
BUN: 27 mg/dL — ABNORMAL HIGH (ref 8–23)
CO2: 29 mmol/L (ref 22–32)
Calcium: 9.7 mg/dL (ref 8.9–10.3)
Chloride: 101 mmol/L (ref 98–111)
Creatinine: 0.97 mg/dL (ref 0.44–1.00)
GFR, Estimated: 56 mL/min — ABNORMAL LOW (ref >60)
Glucose, Bld: 186 mg/dL — ABNORMAL HIGH (ref 70–99)
Potassium: 3.5 mmol/L (ref 3.5–5.1)
Sodium: 138 mmol/L (ref 135–145)
Total Bilirubin: 0.6 mg/dL (ref 0.3–1.2)
Total Protein: 8.5 g/dL — ABNORMAL HIGH (ref 6.5–8.1)

## 2021-05-20 NOTE — Progress Notes (Signed)
[place]   [phone] [phone]   Clinic Follow up Note   Patient Care Team: Willey Blade, MD as PCP - General (Internal Medicine) Sharyne Peach, MD as Consulting Physician (Ophthalmology) Rockwell Germany, RN as Oncology Nurse Navigator Mauro Kaufmann, RN as Oncology Nurse Navigator Jovita Kussmaul, MD as Consulting Physician (General Surgery) Truitt Merle, MD as Consulting Physician (Hematology) Armbruster, Carlota Raspberry, MD as Consulting Physician (Gastroenterology)  Date of Service:  05/20/2021  CHIEF COMPLAINT: f/u of left breast cancer  CURRENT THERAPY:  Tamoxifen 4m once daily starting May 2022.  ASSESSMENT & PLAN:  Kristine PYEATTis a 88y.o. female with   1. 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 She underwent left lumpectomy on 06/17/20. Surgical path showed <27m76mf microinvasive carcinoma, otherwise showed high grade DICS and clear margins.  -no radiation recommended given her age and only microinvasion -She started tamoxifen in 07/2020 and is tolerating very well, no noticeable side effects.  Will continue for 5 years -last mammogram on 04/16/21 was benign. -she is clinically doing very well from a breast cancer standpoint. Labs reviewed, liver enzymes slightly elevated but she is followed by Dr. ArmHavery Morosd others. Breast exam deferred due to recent exam in January, per patient. -Lab and follow-up in 6 months. -Continue annual mammogram   2. Osteopenia  -Her 12/2017 DEXA showed osteopenia with lowest T-score -2 at [facility]. She is overdue for repeat; I ordered today. -She is on Tamoxifen, which can help strengthen her bones.    3. Comorbidities: Spinal stenosis, PAD, HTN, HLD, borderline DM -Her spinal stenosis requires she ambulate with walker. She  does have occasional back pain. She has some right hip pain as well.  -Her medications were recently altered by her PCP in April 2022 and instructed to change her diet.  -We discussed her that tamoxifen may increase her blood glucose and cholesterol, follow-up with PCP.     PLAN:  -continue tamoxifen  -I ordered DEXA to be done soon. -Lab and F/u 6 months    No problem-specific Assessment & Plan notes found for this encounter.   SUMMARY OF ONCOLOGIC HISTORY: Oncology History Overview Note  Cancer Staging Ductal carcinoma in situ (DCIS) of left breast Staging form: Breast, AJCC 8th Edition - Clinical stage from 05/13/2020: Stage 0 (cTis (DCIS), cN0, cM0, G3, ER+, PR+, HER2: Not Assessed) - Signed by FenTruitt MerleD on 05/19/2020 Stage prefix: Initial diagnosis    Malignant neoplasm of upper-inner quadrant of left breast in female, estrogen receptor positive ([place]2/23/2022 Cancer Staging   Staging form: Breast, AJCC 8th Edition - Clinical stage from 05/13/2020: Stage 0 (cTis (DCIS), cN0, cM0, G3, ER+, PR+, HER2: Not Assessed) - Signed by FenTruitt MerleD on 05/19/2020 Stage prefix: Initial diagnosis    05/13/2020 Mammogram    IMPRESSION:  1.1 x 1.0 x 0.9 cm mass in the 9:30 o'clock position of the left breast 5 cm from the nipple with imaging features suspicious for malignancy.    05/13/2020 Initial Biopsy   Diagnosis Breast, left, needle core biopsy, 9:30 O'CLOCK left breast AMENDED DIAGNOSIS: - HIGH GRADE DUCTAL CARCINOMA IN SITU WITH CENTRAL NECROSIS AND CALCIFICATIONS. - FOCUS SUSPICIOUS FOR MICROINVASION. Microscopic Comment E-cadherin is positive supporting a ductal phenotype. In performing E-cadherin, subsequent leveling revealed a focus suspicious  for microinvasion. Ancillary studies will be reported separately. Revised results reported to [facility] on 05/15/2020. Dr. Tresa Moore reviewed.   05/13/2020 Receptors her2   Results: IMMUNOHISTOCHEMICAL AND  MORPHOMETRIC ANALYSIS PERFORMED MANUALLY Estrogen Receptor: 95%, POSITIVE, STRONG STAINING INTENSITY Progesterone Receptor: 95%, POSITIVE, STRONG STAINING INTENSITY   05/18/2020 Initial Diagnosis   Ductal carcinoma in situ (DCIS) of left breast   06/10/2020 Genetic Testing   Negative genetic testing:  No pathogenic variants detected on the [employer] CancerNext-Expanded + RNAinsight panel. Two variants of uncertain significance (VUS) were detected - one in the BRCA1 gene called c.4735C>G (p.P1579A) and a second in the BRCA2 gene called c.1741T>C (p.S581P). The report date is 06/10/2020.  The CancerNext-Expanded + RNAinsight gene panel offered by [employer] and includes sequencing and rearrangement analysis for the following 77 genes: AIP, ALK, APC, ATM, AXIN2, BAP1, BARD1, BLM, BMPR1A, BRCA1, BRCA2, BRIP1, CDC73, CDH1, CDK4, CDKN1B, CDKN2A, CHEK2, CTNNA1, DICER1, FANCC, FH, FLCN, GALNT12, KIF1B, LZTR1, MAX, MEN1, MET, MLH1, MSH2, MSH3, MSH6, MUTYH, NBN, NF1, NF2, NTHL1, PALB2, PHOX2B, PMS2, POT1, PRKAR1A, PTCH1, PTEN, RAD51C, RAD51D, RB1, RECQL, RET, SDHA, SDHAF2, SDHB, SDHC, SDHD, SMAD4, SMARCA4, SMARCB1, SMARCE1, STK11, SUFU, TMEM127, TP53, TSC1, TSC2, VHL and XRCC2 (sequencing and deletion/duplication); EGFR, EGLN1, HOXB13, KIT, MITF, PDGFRA, POLD1 and POLE (sequencing only); EPCAM and GREM1 (deletion/duplication only). RNA data is routinely analyzed for use in variant interpretation for all genes.    06/17/2020 Cancer Staging   Staging form: Breast, AJCC 8th Edition - Pathologic stage from 06/17/2020: Stage Unknown (pT39m, pNX, cM0, G2, ER+, PR+, HER2: Not Assessed) - Signed by FTruitt Merle MD on 07/26/2020 Stage prefix: Initial diagnosis Histologic grading system: 3 grade system Residual tumor (R): R0 - None    06/17/2020 Surgery   LEFT BREAST LUMPECTOMY WITH RADIOACTIVE SEED LOCALIZATION by Dr TMarlou Starks  06/17/2020 Pathology Results   FINAL MICROSCOPIC DIAGNOSIS:   A. BREAST, LEFT, LUMPECTOMY:  -  Focus of microinvasive carcinoma (<1 mm) arising in association with  high-grade ductal carcinoma in situ, see comment  - Resection margins are negative; the superior margin is 0.2 cm from  focus of microinvasive carcinoma and DCIS  - Biopsy site changes  - Fibrocystic change with calcifications  - See oncology table      INTERVAL HISTORY:  RBRITTENY FIEBELKORNis here for a follow up of breast cancer. She was last seen by me on 11/20/20. She presents to the clinic accompanied by her daughter. She reports she is doing well overall, no new changes or concerns since her last visit. She denies any side effects from tamoxifen or any new pain. She notes she is eating well.   All other systems were reviewed with the patient and are negative.  MEDICAL HISTORY:  Past Medical History:  Diagnosis Date   Abnormal EKG    Asthma    Carotid artery occlusion    DDD (degenerative disc disease)    Diabetes mellitus without complication ([facility]    Family history of breast cancer    Family history of GI tract cancer    Family history of prostate cancer    Hyperlipidemia    Hypertension    PAD (peripheral artery disease) (HCC)    Right lower quadrant abdominal abscess (HCC)    SBO (small bowel obstruction) ([facility] [place] 03/15/2014   Spinal stenosis    Type II or unspecified type diabetes mellitus without mention of complication, not stated as uncontrolled 03/22/2013   Vitamin D deficiency     SURGICAL  HISTORY: Past Surgical History:  Procedure Laterality Date   BREAST LUMPECTOMY Left 06/17/2020   BREAST LUMPECTOMY WITH RADIOACTIVE SEED LOCALIZATION Left 06/17/2020   Procedure: LEFT BREAST LUMPECTOMY WITH RADIOACTIVE SEED LOCALIZATION;  Surgeon: Jovita Kussmaul, MD;  Location: [facility];  Service: General;  Laterality: Left;   CHOLECYSTECTOMY     CYST EXCISION  back   HERNIA REPAIR     LAPAROSCOPIC APPENDECTOMY N/A 11/15/2014   Procedure: APPENDECTOMY LAPAROSCOPIC;  Surgeon: Michael Boston, MD;   Location: [facility];  Service: General;  Laterality: N/A;   MINI-LAPAROTOMY W/ TUBAL LIGATION  03/21/1962    I have reviewed the social history and family history with the patient and they are unchanged from previous note.  ALLERGIES:  is allergic to ace inhibitors, metformin and related, penicillins, grapefruit extract, and minoxidil.  MEDICATIONS:  Current Outpatient Medications  Medication Sig Dispense Refill   amLODipine (NORVASC) 10 MG tablet Take 10 mg by mouth daily.     atenolol (TENORMIN) 100 MG tablet TAKE ONE TABLET BY MOUTH DAILY *PLEASE SCHEDULE F/U APPOINTMENT TO OBTAIN FURTHER REFILLS* 30 tablet 0   Cholecalciferol (VITAMIN D-3) 1000 UNITS CAPS Take 2,000 Units by mouth daily.      cilostazol (PLETAL) 50 MG tablet TAKE ONE TABLET BY MOUTH TWICE A DAY 180 tablet 0   Coenzyme Q10 200 MG capsule Take 200 mg by mouth daily.     Cyanocobalamin (VITAMIN B 12 PO) Take 500 mcg by mouth daily.     diltiazem (CARDIZEM) 120 MG tablet TAKE ONE TABLET BY MOUTH DAILY 90 tablet 1   Flaxseed, Linseed, (FLAXSEED OIL) 1000 MG CAPS Take 1,000 mg by mouth 4 (four) times daily.     Lancets (FREESTYLE) lancets Test glucose 1 time daily 100 each 1   Magnesium Oxide (MAG-OX PO) Take 1 tablet by mouth daily.      mirabegron ER (MYRBETRIQ) 25 MG TB24 tablet Myrbetriq 25 mg tablet,extended release  Take 1 tablet every day by oral route.     Multiple Vitamin (MULTIVITAMIN ADULT PO) Take 1 tablet by mouth daily at 6 (six) AM.     oxybutynin (DITROPAN) 5 MG tablet Take 5 mg by mouth 2 (two) times daily.     tamoxifen (NOLVADEX) 20 MG tablet Take 1 tablet (20 mg total) by mouth daily. 90 tablet 3   valsartan-hydrochlorothiazide (DIOVAN-HCT) 160-25 MG tablet TAKE ONE TABLET BY MOUTH DAILY 90 tablet 1   vitamin C (ASCORBIC ACID) 250 MG tablet Take 250 mg by mouth daily.     No current facility-administered medications for this visit.    PHYSICAL EXAMINATION: ECOG PERFORMANCE STATUS: 1 - Symptomatic but  completely ambulatory  Vitals:   05/20/21 0800  BP: (!) 155/76  Pulse: 81  Resp: 18  Temp: 98.5 F (36.9 C)  SpO2: 98%   Wt Readings from Last 3 Encounters:  05/20/21 160 lb 6.4 oz (72.8 kg)  02/16/21 166 lb (75.3 kg)  12/12/20 165 lb (74.8 kg)     GENERAL:alert, no distress and comfortable SKIN: skin color normal, no rashes or significant lesions EYES: normal, Conjunctiva are pink and non-injected, sclera clear  NEURO: alert & oriented x 3 with fluent speech   LABORATORY DATA:  I have reviewed the data as listed CBC Latest Ref Rng & Units 05/20/2021 12/12/2020 11/20/2020  WBC 4.0 - 10.5 K/uL 4.6 6.2 3.8(L)  Hemoglobin 12.0 - 15.0 g/dL 12.2 13.1 12.8  Hematocrit 36.0 - 46.0 % 36.0 39.2 38.7  Platelets 150 -  400 K/uL 193 210 251     CMP Latest Ref Rng & Units 05/20/2021 02/16/2021 12/12/2020  Glucose 70 - 99 mg/dL 186(H) - 163(H)  BUN 8 - 23 mg/dL 27(H) - 15  Creatinine 0.44 - 1.00 mg/dL 0.97 - 0.87  Sodium 135 - 145 mmol/L 138 - 141  Potassium 3.5 - 5.1 mmol/L 3.5 - 3.5  Chloride 98 - 111 mmol/L 101 - 103  CO2 22 - 32 mmol/L 29 - 27  Calcium 8.9 - 10.3 mg/dL 9.7 - 10.2  Total Protein 6.5 - 8.1 g/dL 8.5(H) 8.3 8.7(H)  Total Bilirubin 0.3 - 1.2 mg/dL 0.6 0.5 2.5(H)  Alkaline Phos 38 - 126 U/L 141(H) 45 148(H)  AST 15 - 41 U/L 46(H) 19 321(H)  ALT 0 - 44 U/L 63(H) 10 214(H)      RADIOGRAPHIC STUDIES: I have personally reviewed the radiological images as listed and agreed with the findings in the report. No results found.    Orders Placed This Encounter  Procedures   DG Bone Density    Standing Status:   Future    Standing Expiration Date:   05/20/2022    Order Specific Question:   Reason for Exam (SYMPTOM  OR DIAGNOSIS REQUIRED)    Answer:   screening    Order Specific Question:   Preferred imaging location?    Answer:   [facility]   All questions were answered. The patient knows to call the clinic with any problems, questions or concerns. No barriers to  learning was detected. The total time spent in the appointment was 25 minutes.     Truitt Merle, MD 05/20/2021   I, Wilburn Mylar, am acting as scribe for Truitt Merle, MD.   I have reviewed the above documentation for accuracy and completeness, and I agree with the above.

## 2021-06-15 ENCOUNTER — Other Ambulatory Visit: Payer: Self-pay

## 2021-06-15 ENCOUNTER — Ambulatory Visit: Payer: Medicare HMO | Admitting: Podiatry

## 2021-06-15 DIAGNOSIS — M79675 Pain in left toe(s): Secondary | ICD-10-CM | POA: Diagnosis not present

## 2021-06-15 DIAGNOSIS — I739 Peripheral vascular disease, unspecified: Secondary | ICD-10-CM | POA: Diagnosis not present

## 2021-06-15 DIAGNOSIS — L84 Corns and callosities: Secondary | ICD-10-CM

## 2021-06-15 DIAGNOSIS — M79674 Pain in right toe(s): Secondary | ICD-10-CM

## 2021-06-15 DIAGNOSIS — B351 Tinea unguium: Secondary | ICD-10-CM

## 2021-06-15 NOTE — Progress Notes (Signed)
Subjective: ?88 y.o. returns the office today for painful, elongated, thickened toenails which she cannot trim herself. Denies any redness or drainage around the nails. Denies any systemic complaints such as fevers, chills, nausea, vomiting.  ? ?PCP: Willey Blade, MD ? ?Objective: ?AAO ?3, NAD ?DP/PT pulses 1/4, CRT less than 3 seconds ?Nails hypertrophic, dystrophic, elongated, brittle, discolored ? 10. There is tenderness overlying the nails 1-5 bilaterally. There is no surrounding erythema or drainage along the nail sites. ?Hyperkeratotic lesion left plantar heel without any underlying ulceration drainage or signs of infection. ?No open lesions or pre-ulcerative lesions are identified. ?No other areas of tenderness bilateral lower extremities. No overlying edema, erythema, increased warmth. ?No pain with calf compression, swelling, warmth, erythema. ? ?Assessment: ?Patient presents with symptomatic onychomycosis, hyperkeratotic lesion, PAD ? ?Plan: ?-Treatment options including alternatives, risks, complications were discussed ?-Nails sharply debrided ?10 without complication/bleeding. ?-Sharply debrided the callus on the left foot any complications or bleeding as a courtesy.  Continue moisturizer.  ?-Discussed ABI.  Currently stable without any symptoms of PAD.  Monitor ?-Discussed daily foot inspection. If there are any changes, to call the office immediately.  ?-Follow-up in 3 months or sooner if any problems are to arise. In the meantime, encouraged to call the office with any questions, concerns, changes symptoms. ? ?Celesta Gentile, DPM ? ?

## 2021-09-16 ENCOUNTER — Ambulatory Visit: Payer: Medicare HMO | Admitting: Podiatry

## 2021-09-16 DIAGNOSIS — M79674 Pain in right toe(s): Secondary | ICD-10-CM | POA: Diagnosis not present

## 2021-09-16 DIAGNOSIS — I739 Peripheral vascular disease, unspecified: Secondary | ICD-10-CM

## 2021-09-16 DIAGNOSIS — B351 Tinea unguium: Secondary | ICD-10-CM

## 2021-09-16 DIAGNOSIS — M79675 Pain in left toe(s): Secondary | ICD-10-CM | POA: Diagnosis not present

## 2021-09-16 DIAGNOSIS — L84 Corns and callosities: Secondary | ICD-10-CM

## 2021-09-18 NOTE — Progress Notes (Signed)
Subjective: 88y.o. returns the office today for painful, elongated, thickened toenails which she cannot trim herself. Denies any redness or drainage around the nails. Denies any systemic complaints such as fevers, chills, nausea, vomiting.   PCP: Willey Blade, MD  Objective: AAO 3, NAD DP/PT pulses 1/4, CRT less than 3 seconds Nails hypertrophic, dystrophic, elongated, brittle, discolored  10. There is tenderness overlying the nails 1-5 bilaterally. There is no surrounding erythema or drainage along the nail sites. Mild hyperkeratotic lesion left plantar heel without any underlying ulceration drainage or signs of infection. No open lesions or pre-ulcerative lesions are identified. No pain with calf compression, swelling, warmth, erythema.  Assessment: Patient presents with symptomatic onychomycosis, hyperkeratotic lesion, PAD  Plan: -Treatment options including alternatives, risks, complications were discussed -Nails sharply debrided 10 without complication/bleeding. -Sharply debrided the callus on the left foot any complications or bleeding as a courtesy.  Continue moisturizer.  -Monitor circulation.  Last ABI March 29, 2021 -Discussed daily foot inspection. If there are any changes, to call the office immediately.  -Follow-up in 3 months or sooner if any problems are to arise. In the meantime, encouraged to call the office with any questions, concerns, changes symptoms.  Celesta Gentile, DPM

## 2021-09-23 ENCOUNTER — Encounter (HOSPITAL_COMMUNITY): Payer: Self-pay

## 2021-10-05 ENCOUNTER — Other Ambulatory Visit: Payer: Self-pay

## 2021-10-05 ENCOUNTER — Emergency Department (HOSPITAL_BASED_OUTPATIENT_CLINIC_OR_DEPARTMENT_OTHER): Payer: Medicare HMO

## 2021-10-05 ENCOUNTER — Encounter (HOSPITAL_BASED_OUTPATIENT_CLINIC_OR_DEPARTMENT_OTHER): Payer: Self-pay | Admitting: Emergency Medicine

## 2021-10-05 DIAGNOSIS — K8031 Calculus of bile duct with cholangitis, unspecified, with obstruction: Principal | ICD-10-CM | POA: Diagnosis present

## 2021-10-05 DIAGNOSIS — Z79810 Long term (current) use of selective estrogen receptor modulators (SERMs): Secondary | ICD-10-CM

## 2021-10-05 DIAGNOSIS — M4800 Spinal stenosis, site unspecified: Secondary | ICD-10-CM | POA: Diagnosis present

## 2021-10-05 DIAGNOSIS — N182 Chronic kidney disease, stage 2 (mild): Secondary | ICD-10-CM | POA: Diagnosis present

## 2021-10-05 DIAGNOSIS — Z823 Family history of stroke: Secondary | ICD-10-CM

## 2021-10-05 DIAGNOSIS — R748 Abnormal levels of other serum enzymes: Secondary | ICD-10-CM | POA: Diagnosis present

## 2021-10-05 DIAGNOSIS — E1165 Type 2 diabetes mellitus with hyperglycemia: Secondary | ICD-10-CM | POA: Diagnosis present

## 2021-10-05 DIAGNOSIS — Z888 Allergy status to other drugs, medicaments and biological substances status: Secondary | ICD-10-CM

## 2021-10-05 DIAGNOSIS — R7881 Bacteremia: Secondary | ICD-10-CM | POA: Diagnosis present

## 2021-10-05 DIAGNOSIS — E871 Hypo-osmolality and hyponatremia: Secondary | ICD-10-CM | POA: Diagnosis present

## 2021-10-05 DIAGNOSIS — I700 Atherosclerosis of aorta: Secondary | ICD-10-CM | POA: Diagnosis present

## 2021-10-05 DIAGNOSIS — K8050 Calculus of bile duct without cholangitis or cholecystitis without obstruction: Secondary | ICD-10-CM | POA: Diagnosis not present

## 2021-10-05 DIAGNOSIS — Z8042 Family history of malignant neoplasm of prostate: Secondary | ICD-10-CM

## 2021-10-05 DIAGNOSIS — J45909 Unspecified asthma, uncomplicated: Secondary | ICD-10-CM | POA: Diagnosis present

## 2021-10-05 DIAGNOSIS — E860 Dehydration: Secondary | ICD-10-CM | POA: Diagnosis present

## 2021-10-05 DIAGNOSIS — E1151 Type 2 diabetes mellitus with diabetic peripheral angiopathy without gangrene: Secondary | ICD-10-CM | POA: Diagnosis present

## 2021-10-05 DIAGNOSIS — K30 Functional dyspepsia: Secondary | ICD-10-CM | POA: Diagnosis present

## 2021-10-05 DIAGNOSIS — B9620 Unspecified Escherichia coli [E. coli] as the cause of diseases classified elsewhere: Secondary | ICD-10-CM | POA: Diagnosis present

## 2021-10-05 DIAGNOSIS — Z800 Family history of malignant neoplasm of digestive organs: Secondary | ICD-10-CM

## 2021-10-05 DIAGNOSIS — I129 Hypertensive chronic kidney disease with stage 1 through stage 4 chronic kidney disease, or unspecified chronic kidney disease: Secondary | ICD-10-CM | POA: Diagnosis present

## 2021-10-05 DIAGNOSIS — Z8249 Family history of ischemic heart disease and other diseases of the circulatory system: Secondary | ICD-10-CM

## 2021-10-05 DIAGNOSIS — N3281 Overactive bladder: Secondary | ICD-10-CM | POA: Diagnosis present

## 2021-10-05 DIAGNOSIS — Z880 Allergy status to penicillin: Secondary | ICD-10-CM

## 2021-10-05 DIAGNOSIS — K219 Gastro-esophageal reflux disease without esophagitis: Secondary | ICD-10-CM | POA: Diagnosis present

## 2021-10-05 DIAGNOSIS — E876 Hypokalemia: Secondary | ICD-10-CM | POA: Diagnosis present

## 2021-10-05 DIAGNOSIS — N179 Acute kidney failure, unspecified: Secondary | ICD-10-CM | POA: Diagnosis not present

## 2021-10-05 DIAGNOSIS — Z79899 Other long term (current) drug therapy: Secondary | ICD-10-CM

## 2021-10-05 DIAGNOSIS — M1990 Unspecified osteoarthritis, unspecified site: Secondary | ICD-10-CM | POA: Diagnosis present

## 2021-10-05 DIAGNOSIS — Z803 Family history of malignant neoplasm of breast: Secondary | ICD-10-CM

## 2021-10-05 DIAGNOSIS — R7989 Other specified abnormal findings of blood chemistry: Secondary | ICD-10-CM | POA: Diagnosis present

## 2021-10-05 DIAGNOSIS — E785 Hyperlipidemia, unspecified: Secondary | ICD-10-CM | POA: Diagnosis present

## 2021-10-05 DIAGNOSIS — E1122 Type 2 diabetes mellitus with diabetic chronic kidney disease: Secondary | ICD-10-CM | POA: Diagnosis present

## 2021-10-05 DIAGNOSIS — C50912 Malignant neoplasm of unspecified site of left female breast: Secondary | ICD-10-CM | POA: Diagnosis present

## 2021-10-05 DIAGNOSIS — Z9049 Acquired absence of other specified parts of digestive tract: Secondary | ICD-10-CM

## 2021-10-05 LAB — CBC
HCT: 35.3 % — ABNORMAL LOW (ref 36.0–46.0)
Hemoglobin: 12.2 g/dL (ref 12.0–15.0)
MCH: 31.5 pg (ref 26.0–34.0)
MCHC: 34.6 g/dL (ref 30.0–36.0)
MCV: 91.2 fL (ref 80.0–100.0)
Platelets: 219 10*3/uL (ref 150–400)
RBC: 3.87 MIL/uL (ref 3.87–5.11)
RDW: 14.5 % (ref 11.5–15.5)
WBC: 7.2 10*3/uL (ref 4.0–10.5)
nRBC: 0 % (ref 0.0–0.2)

## 2021-10-05 LAB — BASIC METABOLIC PANEL
Anion gap: 12 (ref 5–15)
BUN: 22 mg/dL (ref 8–23)
CO2: 23 mmol/L (ref 22–32)
Calcium: 9.7 mg/dL (ref 8.9–10.3)
Chloride: 97 mmol/L — ABNORMAL LOW (ref 98–111)
Creatinine, Ser: 1.17 mg/dL — ABNORMAL HIGH (ref 0.44–1.00)
GFR, Estimated: 44 mL/min — ABNORMAL LOW (ref >60)
Glucose, Bld: 173 mg/dL — ABNORMAL HIGH (ref 70–99)
Potassium: 3.4 mmol/L — ABNORMAL LOW (ref 3.5–5.1)
Sodium: 132 mmol/L — ABNORMAL LOW (ref 135–145)

## 2021-10-05 LAB — TROPONIN I (HIGH SENSITIVITY): Troponin I (High Sensitivity): 17 ng/L (ref <18)

## 2021-10-05 LAB — LIPASE, BLOOD: Lipase: 46 U/L (ref 11–51)

## 2021-10-05 MED ORDER — ALUM & MAG HYDROXIDE-SIMETH 200-200-20 MG/5ML PO SUSP
30.0000 mL | Freq: Once | ORAL | Status: AC
  Administered 2021-10-05: 30 mL via ORAL
  Filled 2021-10-05: qty 30

## 2021-10-05 MED ORDER — OXYCODONE-ACETAMINOPHEN 5-325 MG PO TABS
ORAL_TABLET | ORAL | Status: AC
  Filled 2021-10-05: qty 1

## 2021-10-05 MED ORDER — OXYCODONE-ACETAMINOPHEN 5-325 MG PO TABS
1.0000 | ORAL_TABLET | ORAL | Status: AC | PRN
  Administered 2021-10-05 – 2021-10-06 (×2): 1 via ORAL
  Filled 2021-10-05: qty 1

## 2021-10-05 MED ORDER — ONDANSETRON 4 MG PO TBDP
4.0000 mg | ORAL_TABLET | Freq: Once | ORAL | Status: AC | PRN
  Administered 2021-10-05: 4 mg via ORAL
  Filled 2021-10-05: qty 1

## 2021-10-05 NOTE — ED Triage Notes (Signed)
Per pt daughter pt chest pain since this this evening. Under ribcage with nausea.

## 2021-10-06 ENCOUNTER — Emergency Department (HOSPITAL_BASED_OUTPATIENT_CLINIC_OR_DEPARTMENT_OTHER): Payer: Medicare HMO

## 2021-10-06 ENCOUNTER — Inpatient Hospital Stay (HOSPITAL_BASED_OUTPATIENT_CLINIC_OR_DEPARTMENT_OTHER)
Admission: EM | Admit: 2021-10-06 | Discharge: 2021-10-11 | DRG: 445 | Disposition: A | Discharge status: Home / Self Care | Payer: Medicare HMO | Attending: Internal Medicine | Admitting: Internal Medicine

## 2021-10-06 DIAGNOSIS — K8031 Calculus of bile duct with cholangitis, unspecified, with obstruction: Secondary | ICD-10-CM

## 2021-10-06 DIAGNOSIS — Z880 Allergy status to penicillin: Secondary | ICD-10-CM | POA: Diagnosis not present

## 2021-10-06 DIAGNOSIS — N182 Chronic kidney disease, stage 2 (mild): Secondary | ICD-10-CM | POA: Diagnosis present

## 2021-10-06 DIAGNOSIS — K831 Obstruction of bile duct: Secondary | ICD-10-CM | POA: Diagnosis not present

## 2021-10-06 DIAGNOSIS — J45909 Unspecified asthma, uncomplicated: Secondary | ICD-10-CM | POA: Diagnosis present

## 2021-10-06 DIAGNOSIS — E1122 Type 2 diabetes mellitus with diabetic chronic kidney disease: Secondary | ICD-10-CM | POA: Diagnosis present

## 2021-10-06 DIAGNOSIS — Z9049 Acquired absence of other specified parts of digestive tract: Secondary | ICD-10-CM | POA: Diagnosis not present

## 2021-10-06 DIAGNOSIS — Z79810 Long term (current) use of selective estrogen receptor modulators (SERMs): Secondary | ICD-10-CM | POA: Diagnosis not present

## 2021-10-06 DIAGNOSIS — Z800 Family history of malignant neoplasm of digestive organs: Secondary | ICD-10-CM | POA: Diagnosis not present

## 2021-10-06 DIAGNOSIS — N3281 Overactive bladder: Secondary | ICD-10-CM | POA: Diagnosis present

## 2021-10-06 DIAGNOSIS — R7881 Bacteremia: Secondary | ICD-10-CM | POA: Diagnosis present

## 2021-10-06 DIAGNOSIS — E871 Hypo-osmolality and hyponatremia: Secondary | ICD-10-CM

## 2021-10-06 DIAGNOSIS — E876 Hypokalemia: Secondary | ICD-10-CM | POA: Diagnosis present

## 2021-10-06 DIAGNOSIS — Z803 Family history of malignant neoplasm of breast: Secondary | ICD-10-CM | POA: Diagnosis not present

## 2021-10-06 DIAGNOSIS — N179 Acute kidney failure, unspecified: Secondary | ICD-10-CM | POA: Diagnosis not present

## 2021-10-06 DIAGNOSIS — E785 Hyperlipidemia, unspecified: Secondary | ICD-10-CM | POA: Diagnosis present

## 2021-10-06 DIAGNOSIS — I700 Atherosclerosis of aorta: Secondary | ICD-10-CM | POA: Diagnosis present

## 2021-10-06 DIAGNOSIS — B9620 Unspecified Escherichia coli [E. coli] as the cause of diseases classified elsewhere: Secondary | ICD-10-CM | POA: Diagnosis present

## 2021-10-06 DIAGNOSIS — C50912 Malignant neoplasm of unspecified site of left female breast: Secondary | ICD-10-CM | POA: Diagnosis present

## 2021-10-06 DIAGNOSIS — E1151 Type 2 diabetes mellitus with diabetic peripheral angiopathy without gangrene: Secondary | ICD-10-CM | POA: Diagnosis present

## 2021-10-06 DIAGNOSIS — K8050 Calculus of bile duct without cholangitis or cholecystitis without obstruction: Secondary | ICD-10-CM | POA: Diagnosis present

## 2021-10-06 DIAGNOSIS — R7989 Other specified abnormal findings of blood chemistry: Secondary | ICD-10-CM | POA: Diagnosis present

## 2021-10-06 DIAGNOSIS — K8051 Calculus of bile duct without cholangitis or cholecystitis with obstruction: Secondary | ICD-10-CM | POA: Diagnosis not present

## 2021-10-06 DIAGNOSIS — Z888 Allergy status to other drugs, medicaments and biological substances status: Secondary | ICD-10-CM | POA: Diagnosis not present

## 2021-10-06 DIAGNOSIS — I10 Essential (primary) hypertension: Secondary | ICD-10-CM | POA: Diagnosis present

## 2021-10-06 DIAGNOSIS — I129 Hypertensive chronic kidney disease with stage 1 through stage 4 chronic kidney disease, or unspecified chronic kidney disease: Secondary | ICD-10-CM | POA: Diagnosis present

## 2021-10-06 DIAGNOSIS — Z8042 Family history of malignant neoplasm of prostate: Secondary | ICD-10-CM | POA: Diagnosis not present

## 2021-10-06 LAB — HEPATIC FUNCTION PANEL
ALT: 94 U/L — ABNORMAL HIGH (ref 0–44)
AST: 103 U/L — ABNORMAL HIGH (ref 15–41)
Albumin: 3.5 g/dL (ref 3.5–5.0)
Alkaline Phosphatase: 241 U/L — ABNORMAL HIGH (ref 38–126)
Bilirubin, Direct: 1.5 mg/dL — ABNORMAL HIGH (ref 0.0–0.2)
Indirect Bilirubin: 0.9 mg/dL (ref 0.3–0.9)
Total Bilirubin: 2.4 mg/dL — ABNORMAL HIGH (ref 0.3–1.2)
Total Protein: 8.5 g/dL — ABNORMAL HIGH (ref 6.5–8.1)

## 2021-10-06 LAB — LACTIC ACID, PLASMA: Lactic Acid, Venous: 1 mmol/L (ref 0.5–1.9)

## 2021-10-06 LAB — COMPREHENSIVE METABOLIC PANEL
ALT: 85 U/L — ABNORMAL HIGH (ref 0–44)
AST: 96 U/L — ABNORMAL HIGH (ref 15–41)
Albumin: 2.7 g/dL — ABNORMAL LOW (ref 3.5–5.0)
Alkaline Phosphatase: 222 U/L — ABNORMAL HIGH (ref 38–126)
Anion gap: 11 (ref 5–15)
BUN: 17 mg/dL (ref 8–23)
CO2: 22 mmol/L (ref 22–32)
Calcium: 9.1 mg/dL (ref 8.9–10.3)
Chloride: 101 mmol/L (ref 98–111)
Creatinine, Ser: 0.84 mg/dL (ref 0.44–1.00)
GFR, Estimated: >60 mL/min (ref >60)
Glucose, Bld: 134 mg/dL — ABNORMAL HIGH (ref 70–99)
Potassium: 3.1 mmol/L — ABNORMAL LOW (ref 3.5–5.1)
Sodium: 134 mmol/L — ABNORMAL LOW (ref 135–145)
Total Bilirubin: 5.5 mg/dL — ABNORMAL HIGH (ref 0.3–1.2)
Total Protein: 7 g/dL (ref 6.5–8.1)

## 2021-10-06 LAB — CBC WITH DIFFERENTIAL/PLATELET
Abs Immature Granulocytes: 0.18 10*3/uL — ABNORMAL HIGH (ref 0.00–0.07)
Basophils Absolute: 0.1 10*3/uL (ref 0.0–0.1)
Basophils Relative: 0 %
Eosinophils Absolute: 0 10*3/uL (ref 0.0–0.5)
Eosinophils Relative: 0 %
HCT: 34.4 % — ABNORMAL LOW (ref 36.0–46.0)
Hemoglobin: 12.1 g/dL (ref 12.0–15.0)
Immature Granulocytes: 1 %
Lymphocytes Relative: 3 %
Lymphs Abs: 0.5 10*3/uL — ABNORMAL LOW (ref 0.7–4.0)
MCH: 32 pg (ref 26.0–34.0)
MCHC: 35.2 g/dL (ref 30.0–36.0)
MCV: 91 fL (ref 80.0–100.0)
Monocytes Absolute: 0.6 10*3/uL (ref 0.1–1.0)
Monocytes Relative: 4 %
Neutro Abs: 14.3 10*3/uL — ABNORMAL HIGH (ref 1.7–7.7)
Neutrophils Relative %: 92 %
Platelets: 136 10*3/uL — ABNORMAL LOW (ref 150–400)
RBC: 3.78 MIL/uL — ABNORMAL LOW (ref 3.87–5.11)
RDW: 14.6 % (ref 11.5–15.5)
WBC: 15.5 10*3/uL — ABNORMAL HIGH (ref 4.0–10.5)
nRBC: 0 % (ref 0.0–0.2)

## 2021-10-06 LAB — URINALYSIS, ROUTINE W REFLEX MICROSCOPIC
Bilirubin Urine: NEGATIVE
Glucose, UA: NEGATIVE mg/dL
Ketones, ur: 40 mg/dL — AB
Nitrite: NEGATIVE
Protein, ur: 100 mg/dL — AB
Specific Gravity, Urine: 1.015 (ref 1.005–1.030)
pH: 6.5 (ref 5.0–8.0)

## 2021-10-06 LAB — URINALYSIS, MICROSCOPIC (REFLEX): RBC / HPF: NONE SEEN RBC/hpf (ref 0–5)

## 2021-10-06 LAB — TROPONIN I (HIGH SENSITIVITY): Troponin I (High Sensitivity): 18 ng/L — ABNORMAL HIGH (ref <18)

## 2021-10-06 LAB — MAGNESIUM: Magnesium: 1.6 mg/dL — ABNORMAL LOW (ref 1.7–2.4)

## 2021-10-06 MED ORDER — VITAMIN D 25 MCG (1000 UNIT) PO TABS
2000.0000 [IU] | ORAL_TABLET | Freq: Every day | ORAL | Status: DC
  Administered 2021-10-06 – 2021-10-11 (×6): 2000 [IU] via ORAL
  Filled 2021-10-06 (×6): qty 2

## 2021-10-06 MED ORDER — SODIUM CHLORIDE 0.9 % IV BOLUS
1000.0000 mL | Freq: Once | INTRAVENOUS | Status: AC
  Administered 2021-10-06: 1000 mL via INTRAVENOUS

## 2021-10-06 MED ORDER — ASCORBIC ACID 500 MG PO TABS
250.0000 mg | ORAL_TABLET | Freq: Every day | ORAL | Status: DC
Start: 2021-10-06 — End: 2021-10-11
  Administered 2021-10-06 – 2021-10-11 (×6): 250 mg via ORAL
  Filled 2021-10-06 (×6): qty 1

## 2021-10-06 MED ORDER — DILTIAZEM HCL 30 MG PO TABS
30.0000 mg | ORAL_TABLET | Freq: Two times a day (BID) | ORAL | Status: DC
  Administered 2021-10-06: 30 mg via ORAL
  Filled 2021-10-06 (×2): qty 1

## 2021-10-06 MED ORDER — HYDRALAZINE HCL 20 MG/ML IJ SOLN: 10.0000 mg | Freq: Four times a day (QID) | INTRAMUSCULAR | Status: DC | PRN

## 2021-10-06 MED ORDER — FENTANYL CITRATE PF 50 MCG/ML IJ SOSY
25.0000 ug | PREFILLED_SYRINGE | Freq: Once | INTRAMUSCULAR | Status: AC
  Administered 2021-10-06: 25 ug via INTRAVENOUS
  Filled 2021-10-06: qty 1

## 2021-10-06 MED ORDER — COENZYME Q10 200 MG PO CAPS: 200.0000 mg | ORAL_CAPSULE | Freq: Every day | ORAL | Status: DC

## 2021-10-06 MED ORDER — ATENOLOL 50 MG PO TABS
25.0000 mg | ORAL_TABLET | Freq: Every day | ORAL | Status: DC
  Administered 2021-10-06: 25 mg via ORAL
  Filled 2021-10-06 (×2): qty 1

## 2021-10-06 MED ORDER — OXYCODONE HCL 5 MG PO TABS
5.0000 mg | ORAL_TABLET | ORAL | Status: DC | PRN
  Administered 2021-10-06 – 2021-10-07 (×2): 5 mg via ORAL
  Filled 2021-10-06 (×2): qty 1

## 2021-10-06 MED ORDER — TAMOXIFEN CITRATE 10 MG PO TABS
20.0000 mg | ORAL_TABLET | Freq: Every day | ORAL | Status: DC
  Administered 2021-10-06 – 2021-10-11 (×6): 20 mg via ORAL
  Filled 2021-10-06 (×7): qty 2

## 2021-10-06 MED ORDER — SENNOSIDES-DOCUSATE SODIUM 8.6-50 MG PO TABS
1.0000 | ORAL_TABLET | Freq: Two times a day (BID) | ORAL | Status: DC
  Administered 2021-10-06 – 2021-10-11 (×10): 1 via ORAL
  Filled 2021-10-06 (×10): qty 1

## 2021-10-06 MED ORDER — ONDANSETRON HCL 4 MG/2ML IJ SOLN
4.0000 mg | Freq: Once | INTRAMUSCULAR | Status: AC
  Administered 2021-10-06: 4 mg via INTRAVENOUS
  Filled 2021-10-06: qty 2

## 2021-10-06 MED ORDER — POTASSIUM CHLORIDE 20 MEQ PO PACK
40.0000 meq | PACK | Freq: Once | ORAL | Status: AC
  Administered 2021-10-06: 40 meq via ORAL
  Filled 2021-10-06: qty 2

## 2021-10-06 MED ORDER — MAGNESIUM SULFATE 2 GM/50ML IV SOLN
2.0000 g | Freq: Once | INTRAVENOUS | Status: AC
  Administered 2021-10-06: 2 g via INTRAVENOUS
  Filled 2021-10-06: qty 50

## 2021-10-06 MED ORDER — METRONIDAZOLE 500 MG/100ML IV SOLN
500.0000 mg | Freq: Two times a day (BID) | INTRAVENOUS | Status: DC
  Administered 2021-10-06 – 2021-10-11 (×10): 500 mg via INTRAVENOUS
  Filled 2021-10-06 (×10): qty 100

## 2021-10-06 MED ORDER — ENOXAPARIN SODIUM 40 MG/0.4ML IJ SOSY
40.0000 mg | PREFILLED_SYRINGE | INTRAMUSCULAR | Status: DC
  Administered 2021-10-06: 40 mg via SUBCUTANEOUS
  Filled 2021-10-06: qty 0.4

## 2021-10-06 MED ORDER — FENTANYL CITRATE PF 50 MCG/ML IJ SOSY
50.0000 ug | PREFILLED_SYRINGE | Freq: Once | INTRAMUSCULAR | Status: AC
  Administered 2021-10-06: 50 ug via INTRAVENOUS
  Filled 2021-10-06: qty 1

## 2021-10-06 MED ORDER — POTASSIUM CHLORIDE 10 MEQ/100ML IV SOLN
10.0000 meq | INTRAVENOUS | Status: AC
  Administered 2021-10-06 (×4): 10 meq via INTRAVENOUS
  Filled 2021-10-06 (×4): qty 100

## 2021-10-06 MED ORDER — SODIUM CHLORIDE 0.9 % IV SOLN: INTRAVENOUS | Status: AC

## 2021-10-06 MED ORDER — IOHEXOL 300 MG/ML  SOLN
100.0000 mL | Freq: Once | INTRAMUSCULAR | Status: AC | PRN
  Administered 2021-10-06: 100 mL via INTRAVENOUS

## 2021-10-06 MED ORDER — ONDANSETRON HCL 4 MG/2ML IJ SOLN
4.0000 mg | Freq: Four times a day (QID) | INTRAMUSCULAR | Status: DC | PRN
  Administered 2021-10-06 – 2021-10-07 (×2): 4 mg via INTRAVENOUS
  Filled 2021-10-06 (×2): qty 2

## 2021-10-06 MED ORDER — SODIUM CHLORIDE 0.9 % IV SOLN
2.0000 g | Freq: Every day | INTRAVENOUS | Status: DC
  Administered 2021-10-06 – 2021-10-10 (×5): 2 g via INTRAVENOUS
  Filled 2021-10-06 (×5): qty 20

## 2021-10-06 MED ORDER — VITAMIN B-12 1000 MCG PO TABS
500.0000 ug | ORAL_TABLET | Freq: Every day | ORAL | Status: DC
  Administered 2021-10-06 – 2021-10-11 (×6): 500 ug via ORAL
  Filled 2021-10-06 (×6): qty 1

## 2021-10-06 NOTE — ED Provider Notes (Signed)
[facility] Provider Note  CSN: [id number] Arrival date & time: 10/05/21 2214  Chief Complaint(s) Abdominal Pain  HPI Kristine Burnett is a 88 y.o. female with a past medical history listed below including hypertension, hyperlipidemia, diabetes, prior SBO's, prior bili obstruction from choledocholithiasis status post cholecystectomy and ERCP.  The history is provided by the patient.  Abdominal Pain Pain location:  Epigastric and periumbilical Pain quality: aching, cramping and squeezing   Pain severity:  Severe Onset quality:  Gradual Duration:  1 week Timing:  Intermittent Progression:  Waxing and waning Relieved by:  Nothing Worsened by:  Nothing Associated symptoms: constipation and nausea   Associated symptoms: no vomiting     Past Medical History Past Medical History:  Diagnosis Date   Abnormal EKG    Asthma    Carotid artery occlusion    DDD (degenerative disc disease)    Diabetes mellitus without complication ([place])    Family history of breast cancer    Family history of GI tract cancer    Family history of prostate cancer    Hyperlipidemia    Hypertension    PAD (peripheral artery disease) ([facility])    Right lower quadrant abdominal abscess ([facility])    SBO (small bowel obstruction) ([place]) 03/15/2014   Spinal stenosis    Type II or unspecified type diabetes mellitus without mention of complication, not stated as uncontrolled 03/22/2013   Vitamin D deficiency    Patient Active Problem List   Diagnosis Date Noted   Choledocholithiasis 10/06/2021   Genetic testing 06/10/2020   Family history of breast cancer    Family history of prostate cancer    Family history of GI tract cancer    Malignant neoplasm of upper-inner quadrant of left breast in female, estrogen receptor positive ([place]) 05/18/2020   Hepatitis    Pain    Biliary obstruction 12/31/2017   Elevated liver enzymes 12/31/2017   Acute urinary retention 11/16/2014   Recurrent  appendicitis s/p lap appy 11/15/2014 11/15/2014   Hypomagnesemia 03/24/2014   Malnutrition of moderate degree ([place]) 03/19/2014   Nausea and vomiting    Hypokalemia    Diabetes mellitus type 2, controlled ([place]) 02/27/2014   CKD stage 2 due to type 2 diabetes mellitus ([place]) 12/25/2013   Toe fracture, right 11/28/2013   Encounter for long-term (current) use of medications 09/17/2013   Essential hypertension 03/22/2013   Hyperlipidemia 03/22/2013   Vitamin D deficiency 03/22/2013   Extrinsic asthma, unspecified 03/22/2013   Home Medication(s) Prior to Admission medications   Medication Sig Start Date End Date Taking? Authorizing Provider  amLODipine (NORVASC) 10 MG tablet Take 10 mg by mouth daily. 11/08/17   [provider]  atenolol (TENORMIN) 100 MG tablet TAKE ONE TABLET BY MOUTH DAILY *PLEASE SCHEDULE F/U APPOINTMENT TO OBTAIN FURTHER REFILLS* 01/27/20   Patwardhan, Reynold Bowen, MD  Cholecalciferol (VITAMIN D-3) 1000 UNITS CAPS Take 2,000 Units by mouth daily.     [provider]  cilostazol (PLETAL) 50 MG tablet TAKE ONE TABLET BY MOUTH TWICE A DAY 05/28/19   Patwardhan, Manish J, MD  Coenzyme Q10 200 MG capsule Take 200 mg by mouth daily.    [provider]  Cyanocobalamin (VITAMIN B 12 PO) Take 500 mcg by mouth daily.    [provider]  diltiazem (CARDIZEM) 120 MG tablet TAKE ONE TABLET BY MOUTH DAILY 05/17/19   Patwardhan, Manish J, MD  Flaxseed, Linseed, (FLAXSEED OIL) 1000 MG CAPS Take 1,000 mg by mouth 4 (  four) times daily.    [provider]  Lancets (FREESTYLE) lancets Test glucose 1 time daily 08/16/13   Unk Pinto, MD  Magnesium Oxide (MAG-OX PO) Take 1 tablet by mouth daily.     [provider]  mirabegron ER (MYRBETRIQ) 25 MG TB24 tablet Myrbetriq 25 mg tablet,extended release  Take 1 tablet every day by oral route.    [provider]  Multiple Vitamin (MULTIVITAMIN ADULT PO) Take 1 tablet by mouth daily at 6 (six)  AM.    [provider]  oxybutynin (DITROPAN) 5 MG tablet Take 5 mg by mouth 2 (two) times daily. 12/22/17   [provider]  tamoxifen (NOLVADEX) 20 MG tablet Take 1 tablet (20 mg total) by mouth daily. 11/20/20   Truitt Merle, MD  valsartan-hydrochlorothiazide (DIOVAN-HCT) 160-25 MG tablet TAKE ONE TABLET BY MOUTH DAILY 05/08/19   Patwardhan, Reynold Bowen, MD  vitamin C (ASCORBIC ACID) 250 MG tablet Take 250 mg by mouth daily.    [provider]                                                                                                                                    Allergies Ace inhibitors, Metformin and related, Penicillins, Grapefruit extract, and Minoxidil  Review of Systems Review of Systems  Gastrointestinal:  Positive for abdominal pain, constipation and nausea. Negative for vomiting.   As noted in HPI  Physical Exam Vital Signs  I have reviewed the triage vital signs BP (!) 181/90   Pulse (!) 102   Temp 98.7 F (37.1 C) (Oral)   Resp (!) 25   Ht '5\' 7"'$  (1.702 m)   Wt 72.6 kg   SpO2 96%   BMI 25.06 kg/m   Physical Exam Vitals reviewed.  Constitutional:      General: She is not in acute distress.    Appearance: She is well-developed. She is not diaphoretic.  HENT:     Head: Normocephalic and atraumatic.     Nose: Nose normal.  Eyes:     General: No scleral icterus.       Right eye: No discharge.        Left eye: No discharge.     Conjunctiva/sclera: Conjunctivae normal.     Pupils: Pupils are equal, round, and reactive to light.  Cardiovascular:     Rate and Rhythm: Normal rate and regular rhythm.     Heart sounds: No murmur heard.    No friction rub. No gallop.  Pulmonary:     Effort: Pulmonary effort is normal. No respiratory distress.     Breath sounds: Normal breath sounds. No stridor. No rales.  Abdominal:     General: There is no distension.     Palpations: Abdomen is soft.     Tenderness: There is abdominal tenderness in the  epigastric area. There is no guarding or rebound.  Musculoskeletal:  General: No tenderness.     Cervical back: Normal range of motion and neck supple.  Skin:    General: Skin is warm and dry.     Findings: No erythema or rash.  Neurological:     Mental Status: She is alert and oriented to person, place, and time.     ED Results and Treatments Labs (all labs ordered are listed, but only abnormal results are displayed) Labs Reviewed  CBC - Abnormal; Notable for the following components:      Result Value   HCT 35.3 (*)    All other components within normal limits  BASIC METABOLIC PANEL - Abnormal; Notable for the following components:   Sodium 132 (*)    Potassium 3.4 (*)    Chloride 97 (*)    Glucose, Bld 173 (*)    Creatinine, Ser 1.17 (*)    GFR, Estimated 44 (*)    All other components within normal limits  URINALYSIS, ROUTINE W REFLEX MICROSCOPIC - Abnormal; Notable for the following components:   Hgb urine dipstick TRACE (*)    Ketones, ur 40 (*)    Protein, ur 100 (*)    Leukocytes,Ua TRACE (*)    All other components within normal limits  HEPATIC FUNCTION PANEL - Abnormal; Notable for the following components:   Total Protein 8.5 (*)    AST 103 (*)    ALT 94 (*)    Alkaline Phosphatase 241 (*)    Total Bilirubin 2.4 (*)    Bilirubin, Direct 1.5 (*)    All other components within normal limits  URINALYSIS, MICROSCOPIC (REFLEX) - Abnormal; Notable for the following components:   Bacteria, UA MANY (*)    All other components within normal limits  TROPONIN I (HIGH SENSITIVITY) - Abnormal; Notable for the following components:   Troponin I (High Sensitivity) 18 (*)    All other components within normal limits  LIPASE, BLOOD  LACTIC ACID, PLASMA  TROPONIN I (HIGH SENSITIVITY)                                                                                                                         EKG    Radiology CT ABDOMEN PELVIS W CONTRAST  Result  Date: 10/06/2021 CLINICAL DATA:  Concern for bowel obstruction. EXAM: CT ABDOMEN AND PELVIS WITH CONTRAST TECHNIQUE: Multidetector CT imaging of the abdomen and pelvis was performed using the standard protocol following bolus administration of intravenous contrast. RADIATION DOSE REDUCTION: This exam was performed according to the departmental dose-optimization program which includes automated exposure control, adjustment of the mA and/or kV according to patient size and/or use of iterative reconstruction technique. CONTRAST:  144m OMNIPAQUE IOHEXOL 300 MG/ML  SOLN COMPARISON:  CT abdomen pelvis dated 12/12/2020. FINDINGS: Lower chest: There are bibasilar linear scarring. Mild right lower lobe bronchiectasis. Three vessel coronary vascular calcification. No intra-abdominal free air or free fluid. Hepatobiliary: The liver is unremarkable. Cholecystectomy. There is diffuse biliary ductal dilatation, progressed since the prior  CT. The common bile duct measures 2.5 cm in diameter. Multiple stones noted in the central CBD at the head of the pancreas. Recommend further evaluation with ERCP. Pancreas: Unremarkable. No pancreatic ductal dilatation or surrounding inflammatory changes. Spleen: Normal in size without focal abnormality. Adrenals/Urinary Tract: The adrenal glands are unremarkable. Small right renal cysts and additional subcentimeter hypodense lesions which are too small to characterize. There is no hydronephrosis on either side. There is symmetric enhancement and excretion of contrast by both kidneys. The visualized ureters and urinary bladder appear unremarkable. Stomach/Bowel: There is sigmoid diverticulosis without active inflammatory changes. There is no bowel obstruction or active inflammation. Appendectomy. Vascular/Lymphatic: Moderate aortoiliac atherosclerotic disease. The IVC is unremarkable no portal venous gas. There is no adenopathy. Reproductive: The uterus and ovaries are grossly unremarkable no  adnexal masses. Other: None Musculoskeletal: Degenerative changes of the spine and osteopenia. No acute osseous pathology. IMPRESSION: 1. Interval progression of biliary ductal dilatation with multiple stones in the central CBD. Recommend further evaluation with ERCP. 2. Sigmoid diverticulosis. No bowel obstruction. 3. Aortic Atherosclerosis (ICD10-I70.0). Electronically Signed   By: Anner Crete M.D.   On: 10/06/2021 01:43   DG Chest 2 View  Result Date: 10/05/2021 CLINICAL DATA:  Chest pain. EXAM: CHEST - 2 VIEW COMPARISON:  Chest radiograph dated 12/12/2020. FINDINGS: No focal consolidation, pleural effusion, pneumothorax. The cardiac silhouette is within normal limits. Atherosclerotic calcification of the aorta. No acute osseous pathology. IMPRESSION: No active cardiopulmonary disease. Electronically Signed   By: Anner Crete M.D.   On: 10/05/2021 22:48    Pertinent labs & imaging results that were available during my care of the patient were reviewed by me and considered in my medical decision making (see MDM for details).  Medications Ordered in ED Medications  oxyCODONE-acetaminophen (PERCOCET/ROXICET) 5-325 MG per tablet 1 tablet (1 tablet Oral Not Given 10/06/21 0439)  ondansetron (ZOFRAN-ODT) disintegrating tablet 4 mg (4 mg Oral Given 10/05/21 2226)  alum & mag hydroxide-simeth (MAALOX/MYLANTA) 200-200-20 MG/5ML suspension 30 mL (30 mLs Oral Given 10/05/21 2354)  sodium chloride 0.9 % bolus 1,000 mL ( Intravenous Stopped 10/06/21 0336)  fentaNYL (SUBLIMAZE) injection 50 mcg (50 mcg Intravenous Given 10/06/21 0136)  ondansetron (ZOFRAN) injection 4 mg (4 mg Intravenous Given 10/06/21 0139)  iohexol (OMNIPAQUE) 300 MG/ML solution 100 mL (100 mLs Intravenous Contrast Given 10/06/21 0114)  fentaNYL (SUBLIMAZE) injection 25 mcg (25 mcg Intravenous Given 10/06/21 0231)                                                                                                                                      Procedures Procedures  (including critical care time)  Medical Decision Making / ED Course    Complexity of Problem:  Co-morbidities/SDOH that complicate the patient evaluation/care: Noted in HPI  Additional history obtained: Clearly notes from GI discussing prior ERCPs  Patient's presenting problem/concern, DDX, and MDM listed below: Abdominal pain We  will assess for intra-abdominal inflammatory/infectious process, bilirubin obstruction, pancreatitis, SBO. Given age and comorbidities, will also assess for cardiac process but feel this is less likely.  Hospitalization Considered:  Yes   Initial Intervention:  IV fluids, IV pain medicine and nausea medicine.    Complexity of Data:   Cardiac Monitoring: The patient was maintained on a cardiac monitor.   I personally viewed and interpreted the cardiac monitored which showed an underlying rhythm of normal sinus rhythm with rates in the 90s EKG without acute ischemic changes or evidence of pericarditis  Laboratory Tests ordered listed below with my independent interpretation: CBC without leukocytosis or anemia. Metabolic panel without significant electrolyte derangements.  She does have mild hyperglycemia without evidence of DKA. Mild renal sufficiency. Evidence of bili obstruction without pancreatitis Initial troponin negative.  Second troponin flat.   Imaging Studies ordered listed below with my independent interpretation: CT of the abdomen without evidence of intra-abdominal inflammatory/infectious process or bowel obstruction.  There is evidence of biliary obstruction with dilated biliary track and stones in the common bile duct     ED Course:    Assessment, Add'l Intervention, and Reassessment: Abdominal pain Work-up favors biliary obstruction with retained stones. Patient is not septic. Treated symptomatically with improved pain. Discussed case with Dr. [place] Crane from hospitalist service who agreed to admit  patient for further work-up and management. They will consult [facility] to assist in care and management.    Final Clinical Impression(s) / ED Diagnoses Final diagnoses:  Biliary obstruction           This chart was dictated using voice recognition software.  Despite best efforts to proofread,  errors can occur which can change the documentation meaning.    Fatima Blank, MD 10/06/21 [phone]

## 2021-10-06 NOTE — Progress Notes (Signed)

## 2021-10-06 NOTE — H&P (Addendum)
History and Physical    Kristine Burnett TGP:[id number] DOB: Aug 16, 1929 DOA: 10/06/2021  PCP: Willey Blade, MD Patient coming from: home  I have personally briefly reviewed patient's old medical records in [place]  Chief Complaint: Intermittent nausea x 1wk, epigastric and periumbilical pain x 1 day  HPI: Kristine Burnett is a 88 y.o. 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 rate 18, height '5\' 8"'$  (1.727 m), weight 71.9 kg, SpO2 93 %. WBC 7.2, hemoglobin 12.2, sodium 132, potassium 3.4, creatinine 1.17, glucose 173, AST 103, ALT 94, total bilirubin 2.4, lipase unremarkable,  CT abdomen pelvis with contrast showed " 1. Interval progression of biliary ductal dilatation with multiple stones in the central CBD. Recommend further evaluation with ERCP. 2. Sigmoid diverticulosis. No bowel obstruction. 3. Aortic Atherosclerosis (ICD10-I70.0)."  He was given multiple dose of IV fentanyl and Zofran, received 1 L of normal saline, hospitalist called to admit patient to the hospital   Review of Systems: As per HPI otherwise all other systems reviewed and are negative.   Past Medical History:  Diagnosis Date   Abnormal EKG    Asthma    Carotid artery occlusion    DDD (degenerative disc disease)    Diabetes mellitus without complication ([place])    Family history of breast cancer    Family history of GI tract cancer    Family history of prostate cancer    Hyperlipidemia    Hypertension    PAD (peripheral artery disease) (HCC)    Right lower quadrant abdominal abscess ([place])    SBO (small bowel  obstruction) ([place]) 03/15/2014   Spinal stenosis    Type II or unspecified type diabetes mellitus without mention of complication, not stated as uncontrolled 03/22/2013   Vitamin D deficiency     Past Surgical History:  Procedure Laterality Date   BREAST LUMPECTOMY Left 06/17/2020   BREAST LUMPECTOMY WITH RADIOACTIVE SEED LOCALIZATION Left 06/17/2020   Procedure: LEFT BREAST LUMPECTOMY WITH RADIOACTIVE SEED LOCALIZATION;  Surgeon: Jovita Kussmaul, MD;  Location: [facility];  Service: General;  Laterality: Left;   CHOLECYSTECTOMY     CYST EXCISION  back   [place] N/A 11/15/2014   Procedure: APPENDECTOMY LAPAROSCOPIC;  Surgeon: Michael Boston, MD;  Location: [facility];  Service: General;  Laterality: N/A;   MINI-LAPAROTOMY W/ TUBAL LIGATION  03/21/1962    Social History  reports that she has never smoked. She has never used smokeless tobacco. She reports that she does not drink alcohol and does not use drugs.  Allergies  Allergen Reactions   Ace Inhibitors Cough    Enalapril OK   Metformin And Related Diarrhea   Penicillins Other (See Comments)    unknown Has patient had a PCN reaction causing immediate rash, facial/tongue/throat swelling, SOB or lightheadedness with hypotension: No Has patient had a PCN reaction causing severe rash involving mucus membranes or skin necrosis: No Has patient had a PCN reaction that required hospitalization: No Has patient had a PCN reaction occurring within the last 10 years: No If all of the above answers are "NO", then  may proceed with Cephalosporin use.    Grapefruit Extract Rash   Minoxidil Other (See Comments)    Grow hair on face    Family History  Problem Relation Age of Onset   Heart attack Mother    Hypertension Mother    Heart attack Father    Stroke Sister    Breast cancer Sister    Breast cancer Sister    Colon cancer Sister    Breast cancer Sister    Prostate cancer Brother     Heart attack Brother        x 2   Cancer Maternal Grandmother        Gastrointestinal, dx >50   Stroke Paternal Grandmother    Stroke Paternal Grandfather    Breast cancer Cousin        maternal first cousin   Esophageal cancer Neg Hx    Stomach cancer Neg Hx    Pancreatic cancer Neg Hx     Prior to Admission medications   Medication Sig Start Date End Date Taking? Authorizing Provider  amLODipine (NORVASC) 10 MG tablet Take 10 mg by mouth daily. 11/08/17   [provider]  atenolol (TENORMIN) 100 MG tablet TAKE ONE TABLET BY MOUTH DAILY *PLEASE SCHEDULE F/U APPOINTMENT TO OBTAIN FURTHER REFILLS* 01/27/20   Patwardhan, Reynold Bowen, MD  Cholecalciferol (VITAMIN D-3) 1000 UNITS CAPS Take 2,000 Units by mouth daily.     [provider]  cilostazol (PLETAL) 50 MG tablet TAKE ONE TABLET BY MOUTH TWICE A DAY 05/28/19   Patwardhan, Manish J, MD  Coenzyme Q10 200 MG capsule Take 200 mg by mouth daily.    [provider]  Cyanocobalamin (VITAMIN B 12 PO) Take 500 mcg by mouth daily.    [provider]  diltiazem (CARDIZEM) 120 MG tablet TAKE ONE TABLET BY MOUTH DAILY 05/17/19   Patwardhan, Manish J, MD  Flaxseed, Linseed, (FLAXSEED OIL) 1000 MG CAPS Take 1,000 mg by mouth 4 (four) times daily.    [provider]  Lancets (FREESTYLE) lancets Test glucose 1 time daily 08/16/13   Unk Pinto, MD  Magnesium Oxide (MAG-OX PO) Take 1 tablet by mouth daily.     [provider]  mirabegron ER (MYRBETRIQ) 25 MG TB24 tablet Myrbetriq 25 mg tablet,extended release  Take 1 tablet every day by oral route.    [provider]  Multiple Vitamin (MULTIVITAMIN ADULT PO) Take 1 tablet by mouth daily at 6 (six) AM.    [provider]  oxybutynin (DITROPAN) 5 MG tablet Take 5 mg by mouth 2 (two) times daily. 12/22/17   [provider]  tamoxifen (NOLVADEX) 20 MG tablet Take 1 tablet (20 mg total) by mouth daily. 11/20/20   Truitt Merle, MD   valsartan-hydrochlorothiazide (DIOVAN-HCT) 160-25 MG tablet TAKE ONE TABLET BY MOUTH DAILY 05/08/19   Patwardhan, Reynold Bowen, MD  vitamin C (ASCORBIC ACID) 250 MG tablet Take 250 mg by mouth daily.    [provider]    Physical Exam: Vitals:   10/06/21 1300 10/06/21 1400 10/06/21 1551 10/06/21 1558  BP: (!) 160/74 (!) 161/73 (!) 132/57   Pulse:  92 92   Resp: (!) 25 (!) 32 18   Temp:   98.7 F (37.1 C)   TempSrc:   Oral   SpO2:  98% 93%   Weight:    71.9 kg  Height:    '5\' 8"'$  (1.727 m)    Constitutional: NAD, calm, comfortable Eyes: PERRL, lids and conjunctivae  normal ENMT: Mucous membranes are moist.  Respiratory: clear to auscultation bilaterally, no wheezing, no crackles. Normal respiratory effort. No accessory muscle use.  Cardiovascular: Regular rate and rhythm,  No extremity edema. 2+ pedal pulses. No carotid bruits.  Abdomen: no tenderness, not distended, Bowel sounds positive.  Musculoskeletal: no clubbing / cyanosis. No joint deformity upper and lower extremities. Good ROM, no contractures. Normal muscle tone.  Skin: no rashes, lesions, ulcers. No induration Neurologic: CN 2-12 grossly intact. Sensation intact, Strength 5/5 in all 4.  Psychiatric: Normal judgment and insight. Alert and oriented x 3. Normal mood.    Labs on Admission: I have personally reviewed following labs and imaging studies  CBC: Recent Labs  Lab 10/05/21 2239 10/06/21 1626  WBC 7.2 15.5*  NEUTROABS  --  PENDING  HGB 12.2 12.1  HCT 35.3* 34.4*  MCV 91.2 91.0  PLT 219 136*    Basic Metabolic Panel: Recent Labs  Lab 10/05/21 2239 10/06/21 1626  NA 132* 134*  K 3.4* 3.1*  CL 97* 101  CO2 23 22  GLUCOSE 173* 134*  BUN 22 17  CREATININE 1.17* 0.84  CALCIUM 9.7 9.1  MG  --  1.6*    GFR: Estimated Creatinine Clearance: 44.9 mL/min (by C-G formula based on SCr of 0.84 mg/dL).  Liver Function Tests: Recent Labs  Lab 10/05/21 2239 10/06/21 1626  AST 103* 96*  ALT 94*  85*  ALKPHOS 241* 222*  BILITOT 2.4* 5.5*  PROT 8.5* 7.0  ALBUMIN 3.5 2.7*    Urine analysis:    Component Value Date/Time   COLORURINE YELLOW 10/06/2021 0057   APPEARANCEUR CLEAR 10/06/2021 0057   LABSPEC 1.015 10/06/2021 0057   PHURINE 6.5 10/06/2021 0057   GLUCOSEU NEGATIVE 10/06/2021 0057   HGBUR TRACE (A) 10/06/2021 0057   BILIRUBINUR NEGATIVE 10/06/2021 0057   KETONESUR 40 (A) 10/06/2021 0057   PROTEINUR 100 (A) 10/06/2021 0057   UROBILINOGEN 0.2 11/15/2014 0650   NITRITE NEGATIVE 10/06/2021 0057   LEUKOCYTESUR TRACE (A) 10/06/2021 0057    Radiological Exams on Admission: CT ABDOMEN PELVIS W CONTRAST  Result Date: 10/06/2021 CLINICAL DATA:  Concern for bowel obstruction. EXAM: CT ABDOMEN AND PELVIS WITH CONTRAST TECHNIQUE: Multidetector CT imaging of the abdomen and pelvis was performed using the standard protocol following bolus administration of intravenous contrast. RADIATION DOSE REDUCTION: This exam was performed according to the departmental dose-optimization program which includes automated exposure control, adjustment of the mA and/or kV according to patient size and/or use of iterative reconstruction technique. CONTRAST:  135m OMNIPAQUE IOHEXOL 300 MG/ML  SOLN COMPARISON:  CT abdomen pelvis dated 12/12/2020. FINDINGS: Lower chest: There are bibasilar linear scarring. Mild right lower lobe bronchiectasis. Three vessel coronary vascular calcification. No intra-abdominal free air or free fluid. Hepatobiliary: The liver is unremarkable. Cholecystectomy. There is diffuse biliary ductal dilatation, progressed since the prior CT. The common bile duct measures 2.5 cm in diameter. Multiple stones noted in the central CBD at the head of the pancreas. Recommend further evaluation with ERCP. Pancreas: Unremarkable. No pancreatic ductal dilatation or surrounding inflammatory changes. Spleen: Normal in size without focal abnormality. Adrenals/Urinary Tract: The adrenal glands are  unremarkable. Small right renal cysts and additional subcentimeter hypodense lesions which are too small to characterize. There is no hydronephrosis on either side. There is symmetric enhancement and excretion of contrast by both kidneys. The visualized ureters and urinary bladder appear unremarkable. Stomach/Bowel: There is sigmoid diverticulosis without active inflammatory changes. There is no bowel obstruction or active inflammation. Appendectomy.  Vascular/Lymphatic: Moderate aortoiliac atherosclerotic disease. The IVC is unremarkable no portal venous gas. There is no adenopathy. Reproductive: The uterus and ovaries are grossly unremarkable no adnexal masses. Other: None Musculoskeletal: Degenerative changes of the spine and osteopenia. No acute osseous pathology. IMPRESSION: 1. Interval progression of biliary ductal dilatation with multiple stones in the central CBD. Recommend further evaluation with ERCP. 2. Sigmoid diverticulosis. No bowel obstruction. 3. Aortic Atherosclerosis (ICD10-I70.0). Electronically Signed   By: Anner Crete M.D.   On: 10/06/2021 01:43   DG Chest 2 View  Result Date: 10/05/2021 CLINICAL DATA:  Chest pain. EXAM: CHEST - 2 VIEW COMPARISON:  Chest radiograph dated 12/12/2020. FINDINGS: No focal consolidation, pleural effusion, pneumothorax. The cardiac silhouette is within normal limits. Atherosclerotic calcification of the aorta. No acute osseous pathology. IMPRESSION: No active cardiopulmonary disease. Electronically Signed   By: Anner Crete M.D.   On: 10/05/2021 22:48    EKG: Independently reviewed.   Assessment/Plan Principal Problem:   Choledocholithiasis Active Problems:   Essential hypertension   Hypokalemia   Hypomagnesemia   LFT elevation   Hyponatremia   Biliary obstruction with dilated biliary track and stones in the common bile duct -History of cholecystectomy -with elevated lft, currently no fever, wbc trending up -Case discussed with GI who  state will see patient tomorrow, will order full liquid diet for now, start hydration, as she appear dehydration, empiric start with Rocephin /Flagyl , as needed analgesics and antiemetics ,GI would like to wait for platel wash out before proceed with ERCP   Mild hyponatremia/hypokalemia/hypomagnesemia /elevation of creatinine Likely due to dehydration Start full liquid diet Start hydration, replace K and mag Hold Diovan HCTZ  Hypertension Start lower dose atenolol, Cardizem with holding parameters Hold Norvasc and Diovan HCTZ  PAD On pletal, last taken on Monday, hold per gi request, resume when ok with GI   Spinal stenosis, walks with a walker  Reports h/o overreactive bladder Bladder is full on Ct scan, bladder scan with 150cc with no urge to urinate, will hold oxybutynin/Myrbetriq Serial bladder scan    DVT prophylaxis: Place and maintain sequential compression device Start: 10/06/21 1732lovenox , scd's   Code Status:   Full, confirmed with family at bedside  Family Communication:    Patient is from: Home with family   Anticipated DC to: Likely home, may need home health   Anticipated DC date: TBD, needs ERCP, needs GI clearance    Consults called:  [facility] will see her on 7/20 Admission status:    Severity of Illness: Inpatient   The appropriate patient status for this patient is INPATIENT due to history and comorbidities, severity of illness, required intensity of service to ensure the patient's safety and to avoid risk of adverse events/further clinical deterioration.  Severity of illness/comorbidities:cbd stone, at risk of cholangitis, on iv abx Intensity of service: tests, high frequency of surveillance, interventions It is not anticipated that the patient will be medically stable for discharge from the hospital within 2 midnights of admission.    Voice Recognition Viviann Spare dictation system was used to create this note, attempts have been made to correct errors.  Please contact the author with questions and/or clarifications.  Florencia Reasons MD PhD FACP [facility]  How to contact the [facility] Attending or Consulting provider [place] or covering provider during after hours [place], for this patient?   Check the care team in [facility] and look for a) attending/consulting [facility] provider listed and b) the [facility] team listed Log into  [email or web address] and use [place]'s universal password to access. If you do not have the password, please contact the hospital operator. Locate the [facility] provider you are looking for under [facility] and page to a number that you can be directly reached. If you still have difficulty reaching the provider, please page the [facility] (Director on Call) for the Hospitalists listed on amion for assistance.  10/06/2021, 5:40 PM   .

## 2021-10-06 NOTE — ED Notes (Signed)
Suction canister changed out with new one. 1000 ml in old canister

## 2021-10-07 DIAGNOSIS — K8050 Calculus of bile duct without cholangitis or cholecystitis without obstruction: Secondary | ICD-10-CM | POA: Diagnosis not present

## 2021-10-07 DIAGNOSIS — K8051 Calculus of bile duct without cholangitis or cholecystitis with obstruction: Secondary | ICD-10-CM | POA: Diagnosis not present

## 2021-10-07 DIAGNOSIS — R7989 Other specified abnormal findings of blood chemistry: Secondary | ICD-10-CM | POA: Diagnosis not present

## 2021-10-07 LAB — COMPREHENSIVE METABOLIC PANEL
ALT: 74 U/L — ABNORMAL HIGH (ref 0–44)
AST: 79 U/L — ABNORMAL HIGH (ref 15–41)
Albumin: 2.7 g/dL — ABNORMAL LOW (ref 3.5–5.0)
Alkaline Phosphatase: 190 U/L — ABNORMAL HIGH (ref 38–126)
Anion gap: 12 (ref 5–15)
BUN: 24 mg/dL — ABNORMAL HIGH (ref 8–23)
CO2: 19 mmol/L — ABNORMAL LOW (ref 22–32)
Calcium: 8.9 mg/dL (ref 8.9–10.3)
Chloride: 104 mmol/L (ref 98–111)
Creatinine, Ser: 1.43 mg/dL — ABNORMAL HIGH (ref 0.44–1.00)
GFR, Estimated: 35 mL/min — ABNORMAL LOW (ref >60)
Glucose, Bld: 156 mg/dL — ABNORMAL HIGH (ref 70–99)
Potassium: 5 mmol/L (ref 3.5–5.1)
Sodium: 135 mmol/L (ref 135–145)
Total Bilirubin: 2.8 mg/dL — ABNORMAL HIGH (ref 0.3–1.2)
Total Protein: 6.9 g/dL (ref 6.5–8.1)

## 2021-10-07 LAB — CBC
HCT: 32.6 % — ABNORMAL LOW (ref 36.0–46.0)
Hemoglobin: 10.9 g/dL — ABNORMAL LOW (ref 12.0–15.0)
MCH: 31.3 pg (ref 26.0–34.0)
MCHC: 33.4 g/dL (ref 30.0–36.0)
MCV: 93.7 fL (ref 80.0–100.0)
Platelets: 121 10*3/uL — ABNORMAL LOW (ref 150–400)
RBC: 3.48 MIL/uL — ABNORMAL LOW (ref 3.87–5.11)
RDW: 15.2 % (ref 11.5–15.5)
WBC: 23.4 10*3/uL — ABNORMAL HIGH (ref 4.0–10.5)
nRBC: 0 % (ref 0.0–0.2)

## 2021-10-07 LAB — BASIC METABOLIC PANEL
Anion gap: 7 (ref 5–15)
BUN: 25 mg/dL — ABNORMAL HIGH (ref 8–23)
CO2: 21 mmol/L — ABNORMAL LOW (ref 22–32)
Calcium: 8.5 mg/dL — ABNORMAL LOW (ref 8.9–10.3)
Chloride: 104 mmol/L (ref 98–111)
Creatinine, Ser: 1.04 mg/dL — ABNORMAL HIGH (ref 0.44–1.00)
GFR, Estimated: 51 mL/min — ABNORMAL LOW (ref >60)
Glucose, Bld: 164 mg/dL — ABNORMAL HIGH (ref 70–99)
Potassium: 4.8 mmol/L (ref 3.5–5.1)
Sodium: 132 mmol/L — ABNORMAL LOW (ref 135–145)

## 2021-10-07 LAB — MAGNESIUM: Magnesium: 2.6 mg/dL — ABNORMAL HIGH (ref 1.7–2.4)

## 2021-10-07 MED ORDER — SODIUM CHLORIDE 0.9 % IV SOLN: INTRAVENOUS | Status: AC

## 2021-10-07 MED ORDER — ENOXAPARIN SODIUM 30 MG/0.3ML IJ SOSY: 30.0000 mg | PREFILLED_SYRINGE | INTRAMUSCULAR | Status: DC

## 2021-10-07 MED ORDER — FAMOTIDINE 20 MG PO TABS
20.0000 mg | ORAL_TABLET | Freq: Two times a day (BID) | ORAL | Status: DC | PRN
Start: 2021-10-07 — End: 2021-10-11
  Administered 2021-10-11: 20 mg via ORAL
  Filled 2021-10-07 (×2): qty 1

## 2021-10-07 MED ORDER — METOPROLOL TARTRATE 25 MG PO TABS
12.5000 mg | ORAL_TABLET | Freq: Two times a day (BID) | ORAL | Status: DC
  Administered 2021-10-07 – 2021-10-08 (×3): 12.5 mg via ORAL
  Filled 2021-10-07 (×3): qty 1

## 2021-10-07 NOTE — Consult Note (Addendum)
Consultation  Referring Provider:  Dr. Erlinda Hong    Primary Care Physician:  Willey Blade, MD Primary Gastroenterologist: Dr. Gevena Barre        Reason for Consultation:  Choledocholithiasis            HPI:   Kristine Burnett is a 88 y.o. female with past medical history of breast cancer, PAD on Pletal, diabetes, previous cholecystectomy and others listed below, who presented to the hospital with a complaint of abdominal pain and intermittent nausea for the past week.  We are consulted in regards to choledocholithiasis.    Today, patient expresses that she started with pain on Monday 7/17 all over her abdomen, up under her left ribs and right ribs and into her back, she thought that this was due to not being able to have a bowel movement and she took a laxative which did allow her to have a stool later that day, but she continued with pain and became very nauseous which is when her family brought her to the ER.  She tells me that since being in the hospital all of her symptoms are better after the meds that they have given her.  She is no longer nauseous and her pain is controlled.    Does tell me that she struggles with some ongoing reflux and indigestion and will sometimes take Pepto-Bismol which seems to help or as needed antacids.    Denies fever, chills, weight loss or blood in her stool.     ER course: CT abdomen pelvis with interval progression of biliary ductal dilation with multiple stones in the central CBD, recommend further evaluation with ERCP, sigmoid diverticulosis with no bowel obstruction and aortic atherosclerosis; AST 103--> 96--> 79, ALT 94--> 85--> 74, alk phos 241--> 222--> 190, total bili 2.4--> 5.5--> 2.8, CBC with a white count of 7.2--> 15.5--> 23.4  GI history: 02/16/2021 office visit with Dr. Havery Moros for elevated liver enzymes at that time discussed abnormal imaging of the biliary tract showing dilated biliary tree with MRCP's and no malignant changes at that time  discussed possible EUS but family opted not to do this if her liver enzymes are back to normal 05/2008 colonoscopy normal other than melanosis coli, sister had colon cancer diagnosed at the age of 52  Past Medical History:  Diagnosis Date   Abnormal EKG    Asthma    Carotid artery occlusion    DDD (degenerative disc disease)    Diabetes mellitus without complication ([place])    Family history of breast cancer    Family history of GI tract cancer    Family history of prostate cancer    Hyperlipidemia    Hypertension    PAD (peripheral artery disease) ([place])    Right lower quadrant abdominal abscess ([place])    SBO (small bowel obstruction) ([place]) 03/15/2014   Spinal stenosis    Type II or unspecified type diabetes mellitus without mention of complication, not stated as uncontrolled 03/22/2013   Vitamin D deficiency     Past Surgical History:  Procedure Laterality Date   BREAST LUMPECTOMY Left 06/17/2020   BREAST LUMPECTOMY WITH RADIOACTIVE SEED LOCALIZATION Left 06/17/2020   Procedure: LEFT BREAST LUMPECTOMY WITH RADIOACTIVE SEED LOCALIZATION;  Surgeon: Jovita Kussmaul, MD;  Location: [place];  Service: General;  Laterality: Left;   CHOLECYSTECTOMY     CYST EXCISION  back   HERNIA REPAIR     LAPAROSCOPIC APPENDECTOMY N/A 11/15/2014   Procedure: APPENDECTOMY LAPAROSCOPIC;  Surgeon: Michael Boston, MD;  Location: [facility];  Service: General;  Laterality: N/A;   MINI-LAPAROTOMY W/ TUBAL LIGATION  03/21/1962    Family History  Problem Relation Age of Onset   Heart attack Mother    Hypertension Mother    Heart attack Father    Stroke Sister    Breast cancer Sister    Breast cancer Sister    Colon cancer Sister    Breast cancer Sister    Prostate cancer Brother    Heart attack Brother        x 2   Cancer Maternal Grandmother        Gastrointestinal, dx >50   Stroke Paternal Grandmother    Stroke Paternal Grandfather    Breast cancer Cousin        maternal first cousin    Esophageal cancer Neg Hx    Stomach cancer Neg Hx    Pancreatic cancer Neg Hx     Social History   Tobacco Use   Smoking status: Never   Smokeless tobacco: Never  Vaping Use   Vaping Use: Never used  Substance Use Topics   Alcohol use: No    Alcohol/week: 0.0 standard drinks of alcohol   Drug use: No    Prior to Admission medications   Medication Sig Start Date End Date Taking? Authorizing Provider  acetaminophen (TYLENOL) 650 MG CR tablet Take 650 mg by mouth See admin instructions. Take 650 mg by mouth in the morning & evening and an additional 650 mg during the day as needed for pain   Yes [provider]  amLODipine (NORVASC) 10 MG tablet Take 10 mg by mouth daily. 11/08/17  Yes [provider]  atenolol (TENORMIN) 100 MG tablet TAKE ONE TABLET BY MOUTH DAILY *PLEASE SCHEDULE F/U APPOINTMENT TO OBTAIN FURTHER REFILLS* Patient taking differently: Take 100 mg by mouth in the morning. 01/27/20  Yes Patwardhan, Manish J, MD  Cholecalciferol (VITAMIN D-3) 1000 UNITS CAPS Take 2,000 Units by mouth daily.    Yes [provider]  cilostazol (PLETAL) 50 MG tablet TAKE ONE TABLET BY MOUTH TWICE A DAY Patient taking differently: Take 50 mg by mouth 2 (two) times daily. 05/28/19  Yes Patwardhan, Manish J, MD  Coenzyme Q10 200 MG capsule Take 200 mg by mouth daily.   Yes [provider]  Cyanocobalamin (VITAMIN B 12 PO) Take 500 mcg by mouth daily.   Yes [provider]  diltiazem (CARDIZEM) 120 MG tablet TAKE ONE TABLET BY MOUTH DAILY Patient taking differently: Take 120 mg by mouth in the morning. 05/17/19  Yes Patwardhan, Manish J, MD  Flaxseed, Linseed, (FLAXSEED OIL) 1000 MG CAPS Take 1,000 mg by mouth 4 (four) times daily.   Yes [provider]  Magnesium 500 MG TABS Take 500 mg by mouth every evening.   Yes [provider]  Multiple Vitamin (MULTIVITAMIN ADULT PO) Take 1 tablet by mouth daily at 6 (six) AM.   Yes [provider]  oxybutynin (DITROPAN) 5 MG tablet Take 5 mg by mouth 2 (two) times daily. 12/22/17  Yes [provider]  tamoxifen (NOLVADEX) 20 MG tablet Take 1 tablet (20 mg total) by mouth daily. 11/20/20  Yes Truitt Merle, MD  valsartan-hydrochlorothiazide (DIOVAN-HCT) 160-25 MG tablet TAKE ONE TABLET BY MOUTH DAILY Patient taking differently: Take 1 tablet by mouth daily. 05/08/19  Yes Patwardhan, Manish J, MD  vitamin C (ASCORBIC ACID) 250 MG tablet Take 250 mg by mouth daily.  Yes [provider]  Lancets (FREESTYLE) lancets Test glucose 1 time daily 08/16/13   Unk Pinto, MD  mirabegron ER (MYRBETRIQ) 25 MG TB24 tablet Take 25 mg by mouth daily.    [provider]    Current Facility-Administered Medications  Medication Dose Route Frequency Provider Last Rate Last Admin   0.9 %  sodium chloride infusion   Intravenous Continuous Florencia Reasons, MD   Stopped at 10/06/21 1808   0.9 %  sodium chloride infusion   Intravenous Continuous Florencia Reasons, MD       ascorbic acid (VITAMIN C) tablet 250 mg  250 mg Oral Daily Florencia Reasons, MD   250 mg at 10/06/21 2150   cefTRIAXone (ROCEPHIN) 2 g in sodium chloride 0.9 % 100 mL IVPB  2 g Intravenous q1800 Florencia Reasons, MD 200 mL/hr at 10/06/21 1911 2 g at 10/06/21 1911   cholecalciferol (VITAMIN D3) tablet 2,000 Units  2,000 Units Oral Daily Florencia Reasons, MD   2,000 Units at 10/06/21 1759   enoxaparin (LOVENOX) injection 30 mg  30 mg Subcutaneous Q24H Pham, Anh P, RPH       hydrALAZINE (APRESOLINE) injection 10 mg  10 mg Intravenous Q6H PRN Florencia Reasons, MD       metoprolol tartrate (LOPRESSOR) tablet 12.5 mg  12.5 mg Oral BID Florencia Reasons, MD       metroNIDAZOLE (FLAGYL) IVPB 500 mg  500 mg Intravenous Q12H Florencia Reasons, MD 100 mL/hr at 10/07/21 0522 500 mg at 10/07/21 0522   ondansetron (ZOFRAN) injection 4 mg  4 mg Intravenous Q6H PRN Florencia Reasons, MD   4 mg at 10/06/21 1904   oxyCODONE (Oxy IR/ROXICODONE) immediate release tablet 5 mg  5 mg Oral Q4H PRN Florencia Reasons, MD   5 mg at 10/06/21 1759   senna-docusate (Senokot-S) tablet 1 tablet  1 tablet Oral BID Florencia Reasons, MD   1 tablet at 10/06/21 2150   tamoxifen (NOLVADEX) tablet 20 mg  20 mg Oral Daily Florencia Reasons, MD   20 mg at 10/06/21 2228   vitamin B-12 (CYANOCOBALAMIN) tablet 500 mcg  500 mcg Oral Daily Florencia Reasons, MD   500 mcg at 10/06/21 1759    Allergies as of 10/05/2021 - Review Complete 10/05/2021  Allergen Reaction Noted   Ace inhibitors Cough 03/19/2013   Metformin and related Diarrhea 11/15/2014   Penicillins Other (See Comments) 03/19/2013   Grapefruit extract Rash 03/19/2013   Minoxidil Other (See Comments) 03/19/2013     Review of Systems:    Constitutional: No weight loss, fever or chills Skin: No rash  Cardiovascular: No chest pain Respiratory: No SOB  Gastrointestinal: See HPI and otherwise negative Genitourinary: No dysuria Neurological: No headache, dizziness or syncope Musculoskeletal: No new muscle or joint pain Hematologic: No bleeding  Psychiatric: No history of depression or anxiety    Physical Exam:  Vital signs in last 24 hours: Temp:  [97.9 F (36.6 C)-99.2 F (37.3 C)] 98.3 F (36.8 C) (07/20 0434) Pulse Rate:  [79-95] 84 (07/20 0434) Resp:  [17-32] 18 (07/20 0434) BP: (109-168)/(57-104) 128/68 (07/20 0434) SpO2:  [93 %-100 %] 95 % (07/20 0434) Weight:  [71.9 kg] 71.9 kg (07/19 1558) Last BM Date : 10/05/21 General:   Pleasant elderly, AA female appears to be in NAD, Well developed, Well nourished, alert and cooperative Head:  Normocephalic and atraumatic. Eyes:   PEERL, EOMI. No icterus. Conjunctiva pink. Ears:  Normal auditory acuity. Neck:  Supple Throat: Oral cavity and pharynx without  inflammation, swelling or lesion.  Lungs: Respirations even and unlabored. Lungs clear to auscultation bilaterally.   No wheezes, crackles, or rhonchi.  Heart: Normal S1, S2. No MRG. Regular rate and rhythm. No peripheral edema, cyanosis or pallor.  Abdomen:  Soft,  nondistended, nontender. No rebound or guarding. Normal bowel sounds. No appreciable masses or hepatomegaly. Rectal:  Not performed.  Msk:  Symmetrical without gross deformities. Peripheral pulses intact.  Extremities:  Without edema, no deformity or joint abnormality.  Neurologic:  Alert and oriented x4;  grossly normal neurologically.  Skin:   Dry and intact without significant lesions or rashes. Psychiatric: Demonstrates good judgement and reason without abnormal affect or behaviors.   LAB RESULTS: Recent Labs    10/05/21 2239 10/06/21 1626 10/07/21 0437  WBC 7.2 15.5* 23.4*  HGB 12.2 12.1 10.9*  HCT 35.3* 34.4* 32.6*  PLT 219 136* 121*   BMET Recent Labs    10/05/21 2239 10/06/21 1626 10/07/21 0437  NA 132* 134* 135  K 3.4* 3.1* 5.0  CL 97* 101 104  CO2 23 22 19*  GLUCOSE 173* 134* 156*  BUN 22 17 24*  CREATININE 1.17* 0.84 1.43*  CALCIUM 9.7 9.1 8.9      Latest Ref Rng & Units 10/07/2021    4:37 AM 10/06/2021    4:26 PM 10/05/2021   10:39 PM  Hepatic Function  Total Protein 6.5 - 8.1 g/dL 6.9  7.0  8.5   Albumin 3.5 - 5.0 g/dL 2.7  2.7  3.5   AST 15 - 41 U/L 79  96  103   ALT 0 - 44 U/L 74  85  94   Alk Phosphatase 38 - 126 U/L 190  222  241   Total Bilirubin 0.3 - 1.2 mg/dL 2.8  5.5  2.4   Bilirubin, Direct 0.0 - 0.2 mg/dL   1.5       STUDIES: CT ABDOMEN PELVIS W CONTRAST  Result Date: 10/06/2021 CLINICAL DATA:  Concern for bowel obstruction. EXAM: CT ABDOMEN AND PELVIS WITH CONTRAST TECHNIQUE: Multidetector CT imaging of the abdomen and pelvis was performed using the standard protocol following bolus administration of intravenous contrast. RADIATION DOSE REDUCTION: This exam was performed according to the departmental dose-optimization program which includes automated exposure control, adjustment of the mA and/or kV according to patient size and/or use of iterative reconstruction technique. CONTRAST:  182m OMNIPAQUE IOHEXOL 300 MG/ML  SOLN COMPARISON:  CT  abdomen pelvis dated 12/12/2020. FINDINGS: Lower chest: There are bibasilar linear scarring. Mild right lower lobe bronchiectasis. Three vessel coronary vascular calcification. No intra-abdominal free air or free fluid. Hepatobiliary: The liver is unremarkable. Cholecystectomy. There is diffuse biliary ductal dilatation, progressed since the prior CT. The common bile duct measures 2.5 cm in diameter. Multiple stones noted in the central CBD at the head of the pancreas. Recommend further evaluation with ERCP. Pancreas: Unremarkable. No pancreatic ductal dilatation or surrounding inflammatory changes. Spleen: Normal in size without focal abnormality. Adrenals/Urinary Tract: The adrenal glands are unremarkable. Small right renal cysts and additional subcentimeter hypodense lesions which are too small to characterize. There is no hydronephrosis on either side. There is symmetric enhancement and excretion of contrast by both kidneys. The visualized ureters and urinary bladder appear unremarkable. Stomach/Bowel: There is sigmoid diverticulosis without active inflammatory changes. There is no bowel obstruction or active inflammation. Appendectomy. Vascular/Lymphatic: Moderate aortoiliac atherosclerotic disease. The IVC is unremarkable no portal venous gas. There is no adenopathy. Reproductive: The uterus and ovaries are grossly unremarkable no  adnexal masses. Other: None Musculoskeletal: Degenerative changes of the spine and osteopenia. No acute osseous pathology. IMPRESSION: 1. Interval progression of biliary ductal dilatation with multiple stones in the central CBD. Recommend further evaluation with ERCP. 2. Sigmoid diverticulosis. No bowel obstruction. 3. Aortic Atherosclerosis (ICD10-I70.0). Electronically Signed   By: Anner Crete M.D.   On: 10/06/2021 01:43   DG Chest 2 View  Result Date: 10/05/2021 CLINICAL DATA:  Chest pain. EXAM: CHEST - 2 VIEW COMPARISON:  Chest radiograph dated 12/12/2020. FINDINGS: No  focal consolidation, pleural effusion, pneumothorax. The cardiac silhouette is within normal limits. Atherosclerotic calcification of the aorta. No acute osseous pathology. IMPRESSION: No active cardiopulmonary disease. Electronically Signed   By: Anner Crete M.D.   On: 10/05/2021 22:48     Impression / Plan:   Impression: 1.  Choledocholithiasis: CT showing multiple stones in the common bile duct, history of cholecystectomy, elevated LFTs--> trending down today, elevated white count--> trending down on antibiotics today 2.  PAD: On Pletal-last dose Monday, 10/04/2021-on hold 3.  Spinal stenosis: Uses walker 4.  Mild hyponatremia/hypokalemia/hypomagnesemia 5. GERD: Consider relation to the above, could discuss a long-term antireflux medicine in the future  Plan: 1.  We are trying to schedule patient's ERCP Fri or Sat with Dr. Fuller Plan.  Did discuss the possibility that this may be delayed until Sat with the family.  Did discuss risk and benefits, limitations and alternatives and patient agrees to proceed. 2.  Continue to hold Pletal, last dose reported Monday 7/17 3.  Placed orders to hold evening dose of Lovenox tonight in preparation for likely procedure tomorrow. 4.  Patient can be on heart healthy diet today and n.p.o. at midnight 5.  Continue analgesics and antiemetics as needed 6.  Agree with antibiotics 7.  We should know by later today if we are able to proceed with ERCP tomorrow. 8.  Hospitalist team to continue working on hypokalemia, this will also need to be adjusted prior to time of procedure tomorrow 9.  Patient discusses some indigestion issues will add Pepcid 20 mg as needed while in the hospital  Thank you for your kind consultation, we will continue to follow.  Lavone Nian [facility]  10/07/2021, 8:50 AM     Attending Physician Note   I have taken a history, reviewed the chart and examined the patient. I performed a substantive portion of this encounter, including  complete performance of at least one of the key components, in conjunction with the APP. I agree with the APP's note, impression and recommendations with my edits. My additional impressions and recommendations are as follows.   Choledocholithiasis, biliary dilation, elevated LFTs. ERCP sphincterotomy, stone extraction in 2010. S/P cholecystectomy.   Recommend ERCP on Fri or Sat pending endo unit and anesthesia availability. Continue IV antibiotics. Continue to hold Pletal. Hold Lovenox the evening prior to ERCP.   Lucio Edward, MD [facility] See AMION, [facility], for our on call provider

## 2021-10-07 NOTE — Progress Notes (Signed)
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   Subjective:  Received several doses of Zofran and oxycodone Currently appears drowsy, denies pain, reports no appetite No fever Family at bedside  Assessment & Plan:  Principal Problem:   Choledocholithiasis Active Problems:   Essential hypertension   Hypokalemia   Hypomagnesemia   LFT elevation   Hyponatremia    Assessment and Plan:  Biliary obstruction with dilated biliary track and stones in the common bile duct -History of cholecystectomy and CBD stones required ERCP in the past -with elevated lft, currently no fever, wbc trending up -continue hydration, empiric abxRocephin /Flagyl , as needed analgesics and antiemetics -GI would like to wait for platel wash out before proceed with ERCP , tentatively scheduled on 7/21  AKI on CKDII Cr jumped to 1.43, repeat bladder scan reported at 0 this morning UA is dark, positive ketone, positive protein, many bacteria, trace leukocytes, urine culture in process Continue hydration, already on antibiotics, renal dosing meds, avoid nephrotoxin, Hold Diovan HCTZ   Mild hyponatremia/hypokalemia/hypomagnesemia  Likely due to dehydration/poor oral intake Continue hydration hydration Improved Hold Diovan HCTZ  Leukocytosis Dehydration/stress/infection  add on blood culture/urine culture Continue current antibiotics Continue hydration Repeat CBC in the morning   Hypertension Continue low-dose beta-blocker with holding parameters Hold Cardizem ,Norvasc and Diovan HCTZ   PAD On  pletal, last taken on Monday, hold per gi request, resume when ok with GI   Spinal stenosis, walks with a walker, plan PT eval once medically stable   Reports h/o overreactive bladder Bladder is full on Ct scan, bladder scan with 150cc with no urge to urinate, will hold oxybutynin/Myrbetriq Serial bladder scan, report bladder scan is 0 this morning     I have Reviewed nursing notes, Vitals, pain scores, I/o's, Lab results and  imaging results since pt's last encounter, details please see discussion above  I ordered the following labs:  Unresulted Labs (From admission, onward)     Start     Ordered   10/08/21 0500  CBC with Differential/Platelet  Tomorrow morning,   R       Question:  Specimen collection method  Answer:  Lab=Lab collect   10/07/21 1426   10/08/21 0500  Comprehensive metabolic panel  Daily at 5am,   R     Question:  Specimen collection method  Answer:  Lab=Lab collect   10/07/21 1426   10/07/21 2440  Basic metabolic panel  Once-Timed,   TIMED        10/07/21 0840   10/07/21 1436  Culture, blood (Routine X 2) w Reflex to ID Panel  BLOOD CULTURE X 2,   R (with TIMED occurrences)      10/07/21 1435   10/07/21 0837  Urine Culture  (Urine Culture)  Once,   R       Question:  Indication  Answer:  Dysuria   10/07/21 0836             DVT prophylaxis: SCDs Start: 10/06/21 1803 Place and maintain sequential compression device Start: 10/06/21 1732   Code  Status:   Code Status: Full Code  Family Communication: Family at the bedside Disposition:    Dispo: The patient is from: Home              Anticipated d/c is to: Home, likely will need home health              Anticipated d/c date is: TBD, need ERCP, need GI clearance  Antimicrobials:    Anti-infectives (From admission, onward)    Start     Dose/Rate Route Frequency Ordered Stop   10/06/21 1900  cefTRIAXone (ROCEPHIN) 2 g in sodium chloride 0.9 % 100 mL IVPB        2 g 200 mL/hr over 30 Minutes  Intravenous Daily-1800 10/06/21 1805     10/06/21 1800  metroNIDAZOLE (FLAGYL) IVPB 500 mg        500 mg 100 mL/hr over 60 Minutes Intravenous Every 12 hours 10/06/21 1700            Objective: Vitals:   10/06/21 2134 10/07/21 0152 10/07/21 0434 10/07/21 1224  BP: 119/63 118/67 128/68 (!) 141/71  Pulse: 83 79 84 87  Resp: (!) '21 17 18 17  '$ Temp: 97.9 F (36.6 C) 97.9 F (36.6 C) 98.3 F (36.8 C) 97.8 F (36.6 C)  TempSrc: Oral Oral Oral Oral  SpO2: 96% 98% 95% 96%  Weight:      Height:        Intake/Output Summary (Last 24 hours) at 10/07/2021 1438 Last data filed at 10/07/2021 1300 Gross per 24 hour  Intake 1705.19 ml  Output 600 ml  Net 1105.19 ml   Filed Weights   10/05/21 2220 10/06/21 1558  Weight: 72.6 kg 71.9 kg    Examination:  General exam: Sleepy, oriented x3, NAD Respiratory system: Clear to auscultation. Respiratory effort normal. Cardiovascular system:  RRR.  Gastrointestinal system: Abdomen is nondistended, soft and nontender.  Normal bowel sounds heard. Central nervous system: Alert and oriented. No focal neurological deficits. Extremities:  no edema Skin: No rashes, lesions or ulcers Psychiatry: Judgement and insight appear normal. Mood & affect appropriate.     Data Reviewed: I have personally reviewed  labs and visualized  imaging studies since the last encounter and formulate the plan        Scheduled Meds:  vitamin C  250 mg Oral Daily   cholecalciferol  2,000 Units Oral Daily   metoprolol tartrate  12.5 mg Oral BID   senna-docusate  1 tablet Oral BID   tamoxifen  20 mg Oral Daily   vitamin B-12  500 mcg Oral Daily   Continuous Infusions:  sodium chloride Stopped (10/06/21 1808)   sodium chloride 75 mL/hr at 10/07/21 0955   cefTRIAXone (ROCEPHIN)  IV 2 g (10/06/21 1911)   metronidazole 500 mg (10/07/21 0522)     LOS: 1 day      Florencia Reasons, MD PhD FACP [facility]  Available via Epic secure chat 7am-7pm for  nonurgent issues Please page for urgent issues To page the attending provider between 7A-7P or the covering provider during after hours 7P-7A, please log into the web site [email or web address] and access using universal [place] password for that web site. If you do not have the password, please call the hospital operator.    10/07/2021, 2:38 PM

## 2021-10-07 NOTE — TOC Initial Note (Signed)
Transition of Care [facility]) - Initial/Assessment Note    Patient Details  Name: ABRAR KOONE MRN: [id number] Date of Birth: 1929/12/07  Transition of Care (TOC) CM/SW Contact:    Joaquin Courts, RN Phone Number: 10/07/2021, 11:12 AM  Clinical Narrative:                 CM reviewed chart, noted patient is from home, has a pcp, and remains on room air.  Noted patient walks with a walker at baseline.  Have outreached to provider with request for PT/OT eval for discharge planning.  TOC will follow for possible needs.  Expected Discharge Plan: Home/Self Care Barriers to Discharge: No Barriers Identified   Patient Goals and CMS Choice Patient states their goals for this hospitalization and ongoing recovery are:: to get better      Expected Discharge Plan and Services Expected Discharge Plan: Home/Self Care       Living arrangements for the past 2 months: Single Family Home                                      Prior Living Arrangements/Services Living arrangements for the past 2 months: Single Family Home Lives with:: Self Patient language and need for interpreter reviewed:: Yes Do you feel safe going back to the place where you live?: Yes      Need for Family Participation in Patient Care: Yes (Comment) Care giver support system in place?: Yes (comment)   Criminal Activity/Legal Involvement Pertinent to Current Situation/Hospitalization: No - Comment as needed  Activities of Daily Living Home Assistive Devices/Equipment: Walker (specify type) ADL Screening (condition at time of admission) Patient's cognitive ability adequate to safely complete daily activities?: Yes Is the patient deaf or have difficulty hearing?: No Does the patient have difficulty seeing, even when wearing glasses/contacts?: No Does the patient have difficulty concentrating, remembering, or making decisions?: No Patient able to express need for assistance with ADLs?: Yes Does the patient have  difficulty dressing or bathing?: No Independently performs ADLs?: No Communication: Independent Dressing (OT): Needs assistance Is this a change from baseline?: Change from baseline, expected to last <3days Grooming: Needs assistance Is this a change from baseline?: Change from baseline, expected to last <3 days Feeding: Independent Bathing: Needs assistance Is this a change from baseline?: Change from baseline, expected to last <3 days Toileting: Needs assistance Is this a change from baseline?: Pre-admission baseline In/Out Bed: Needs assistance Is this a change from baseline?: Change from baseline, expected to last <3 days Walks in Home: Independent with device (comment) Does the patient have difficulty walking or climbing stairs?: No Weakness of Legs: Both Weakness of Arms/Hands: None  Permission Sought/Granted                  Emotional Assessment Appearance:: Appears stated age     Orientation: : Oriented to Self, Oriented to Place, Oriented to  Time, Oriented to Situation   Psych Involvement: No (comment)  Admission diagnosis:  Choledocholithiasis [K80.50] Biliary obstruction [K83.1] LFT elevation [R79.89] Patient Active Problem List   Diagnosis Date Noted   Choledocholithiasis 10/06/2021   LFT elevation 10/06/2021   Hyponatremia 10/06/2021   Genetic testing 06/10/2020   Family history of breast cancer    Family history of prostate cancer    Family history of GI tract cancer    Malignant neoplasm of upper-inner quadrant of left breast in female, estrogen  receptor positive ([place]) 05/18/2020   Hepatitis    Pain    Biliary obstruction 12/31/2017   Elevated liver enzymes 12/31/2017   Acute urinary retention 11/16/2014   Recurrent appendicitis s/p lap appy 11/15/2014 11/15/2014   Hypomagnesemia 03/24/2014   Malnutrition of moderate degree (HCC) 03/19/2014   Nausea and vomiting    Hypokalemia    Diabetes mellitus type 2, controlled ([place]) 02/27/2014   CKD  stage 2 due to type 2 diabetes mellitus ([place]) 12/25/2013   Toe fracture, right 11/28/2013   Encounter for long-term (current) use of medications 09/17/2013   Essential hypertension 03/22/2013   Hyperlipidemia 03/22/2013   Vitamin D deficiency 03/22/2013   Extrinsic asthma, unspecified 03/22/2013   PCP:  Willey Blade, MD Pharmacy:   [id number] - [place], [place] - [address] [place] [address] Phone: [phone] Fax: [phone]     Social Determinants of Health (SDOH) Interventions    Readmission Risk Interventions     No data to display

## 2021-10-08 DIAGNOSIS — K8050 Calculus of bile duct without cholangitis or cholecystitis without obstruction: Secondary | ICD-10-CM | POA: Diagnosis not present

## 2021-10-08 DIAGNOSIS — K8051 Calculus of bile duct without cholangitis or cholecystitis with obstruction: Secondary | ICD-10-CM | POA: Diagnosis not present

## 2021-10-08 DIAGNOSIS — R7989 Other specified abnormal findings of blood chemistry: Secondary | ICD-10-CM | POA: Diagnosis not present

## 2021-10-08 LAB — CBC WITH DIFFERENTIAL/PLATELET
Abs Immature Granulocytes: 0.72 10*3/uL — ABNORMAL HIGH (ref 0.00–0.07)
Basophils Absolute: 0.1 10*3/uL (ref 0.0–0.1)
Basophils Relative: 0 %
Eosinophils Absolute: 0 10*3/uL (ref 0.0–0.5)
Eosinophils Relative: 0 %
HCT: 30.3 % — ABNORMAL LOW (ref 36.0–46.0)
Hemoglobin: 10.7 g/dL — ABNORMAL LOW (ref 12.0–15.0)
Immature Granulocytes: 4 %
Lymphocytes Relative: 4 %
Lymphs Abs: 0.7 10*3/uL (ref 0.7–4.0)
MCH: 31.6 pg (ref 26.0–34.0)
MCHC: 35.3 g/dL (ref 30.0–36.0)
MCV: 89.4 fL (ref 80.0–100.0)
Monocytes Absolute: 0.3 10*3/uL (ref 0.1–1.0)
Monocytes Relative: 2 %
Neutro Abs: 16.6 10*3/uL — ABNORMAL HIGH (ref 1.7–7.7)
Neutrophils Relative %: 90 %
Platelets: 112 10*3/uL — ABNORMAL LOW (ref 150–400)
RBC: 3.39 MIL/uL — ABNORMAL LOW (ref 3.87–5.11)
RDW: 14.8 % (ref 11.5–15.5)
WBC: 18.4 10*3/uL — ABNORMAL HIGH (ref 4.0–10.5)
nRBC: 0 % (ref 0.0–0.2)

## 2021-10-08 LAB — COMPREHENSIVE METABOLIC PANEL
ALT: 48 U/L — ABNORMAL HIGH (ref 0–44)
AST: 36 U/L (ref 15–41)
Albumin: 2.4 g/dL — ABNORMAL LOW (ref 3.5–5.0)
Alkaline Phosphatase: 181 U/L — ABNORMAL HIGH (ref 38–126)
Anion gap: 9 (ref 5–15)
BUN: 19 mg/dL (ref 8–23)
CO2: 23 mmol/L (ref 22–32)
Calcium: 8.4 mg/dL — ABNORMAL LOW (ref 8.9–10.3)
Chloride: 103 mmol/L (ref 98–111)
Creatinine, Ser: 0.79 mg/dL (ref 0.44–1.00)
GFR, Estimated: >60 mL/min (ref >60)
Glucose, Bld: 119 mg/dL — ABNORMAL HIGH (ref 70–99)
Potassium: 4.1 mmol/L (ref 3.5–5.1)
Sodium: 135 mmol/L (ref 135–145)
Total Bilirubin: 1.4 mg/dL — ABNORMAL HIGH (ref 0.3–1.2)
Total Protein: 6.3 g/dL — ABNORMAL LOW (ref 6.5–8.1)

## 2021-10-08 LAB — URINE CULTURE: Culture: NO GROWTH

## 2021-10-08 MED ORDER — METOPROLOL TARTRATE 25 MG PO TABS: 25.0000 mg | ORAL_TABLET | Freq: Two times a day (BID) | ORAL | Status: DC

## 2021-10-08 MED ORDER — LACTATED RINGERS IV SOLN
INTRAVENOUS | Status: AC
Start: 2021-10-08 — End: 2021-10-09

## 2021-10-08 MED ORDER — METOPROLOL TARTRATE 25 MG PO TABS
12.5000 mg | ORAL_TABLET | Freq: Two times a day (BID) | ORAL | Status: DC
  Administered 2021-10-08 – 2021-10-09 (×2): 12.5 mg via ORAL
  Filled 2021-10-08 (×2): qty 1

## 2021-10-08 MED ORDER — SODIUM CHLORIDE 0.9 % IV SOLN: INTRAVENOUS | Status: DC

## 2021-10-08 NOTE — H&P (View-Only) (Signed)
Progress Note   Subjective  Chief Complaint: Choledocholithiasis  Patient stable this morning, tells me she does not want a lot of heavy food to eat today.  She remains slightly nauseous but in no pain.  Aware of plans for ERCP tomorrow.   Objective   Vital signs in last 24 hours: Temp:  [97.8 F (36.6 C)-98.7 F (37.1 C)] 98.7 F (37.1 C) (07/21 0434) Pulse Rate:  [86-92] 92 (07/21 0434) Resp:  [17-18] 18 (07/21 0434) BP: (136-160)/(70-77) 160/77 (07/21 0434) SpO2:  [93 %-96 %] 93 % (07/21 0434) Last BM Date : 10/05/21 General:    [name] in NAD Heart:  Regular rate and rhythm; no murmurs Lungs: Respirations even and unlabored, lungs CTA bilaterally Abdomen:  Soft, nontender and nondistended. Normal bowel sounds. Psych:  Cooperative. Normal mood and affect.  Intake/Output from previous day: 07/20 0701 - 07/21 0700 In: 2101.4 [P.O.:600; I.V.:1289.9; IV Piggyback:211.5] Out: 1150 [Urine:1150]   Lab Results: Recent Labs    10/06/21 1626 10/07/21 0437 10/08/21 0400  WBC 15.5* 23.4* 18.4*  HGB 12.1 10.9* 10.7*  HCT 34.4* 32.6* 30.3*  PLT 136* 121* 112*   BMET Recent Labs    10/07/21 0437 10/07/21 1516 10/08/21 0400  NA 135 132* 135  K 5.0 4.8 4.1  CL 104 104 103  CO2 19* 21* 23  GLUCOSE 156* 164* 119*  BUN 24* 25* 19  CREATININE 1.43* 1.04* 0.79  CALCIUM 8.9 8.5* 8.4*      Latest Ref Rng & Units 10/08/2021    4:00 AM 10/07/2021    4:37 AM 10/06/2021    4:26 PM  Hepatic Function  Total Protein 6.5 - 8.1 g/dL 6.3  6.9  7.0   Albumin 3.5 - 5.0 g/dL 2.4  2.7  2.7   AST 15 - 41 U/L 36  79  96   ALT 0 - 44 U/L 48  74  85   Alk Phosphatase 38 - 126 U/L 181  190  222   Total Bilirubin 0.3 - 1.2 mg/dL 1.4  2.8  5.5       Assessment / Plan:   Assessment: 1.  Choledocholithiasis: CT showing multiple stones in the common bile duct, history of cholecystectomy and previous ERCP with stone removal and sphincterotomy in 2010, LFTs elevated but trending  down, white count also trending down on antibiotics 2.  PAD: On Pletal-last dose 10/04/2021-on hold 3.  Spinal stenosis 4.  Mild hypokalemia: Corrected 5.  GERD  Plan: 1.  Patients ERCP is scheduled for tomorrow morning 10/09/2021 with Dr. Fuller Plan.  Risks and benefits have already been reviewed and patient agrees to proceed. 2.  Patient can be on heart healthy diet today as tolerated and n.p.o. at midnight 3.  Continue to hold Pletal (last dose reported Monday 7/17) 4.  We will need to hold evening dose of Lovenox tonight 5.  Continue analgesics and antiemetics as needed 6.  Agree with antibiotics  Thank you for your kind consultation, we will continue to follow   LOS: 2 days   Levin Erp  10/08/2021, 11:27 AM     Attending Physician Note   I have taken an interval history, reviewed the chart and examined the patient. I performed a substantive portion of this encounter, including complete performance of at least one of the key components, in conjunction with the APP. I agree with the APP's note, impression and recommendations with my edits. My additional impressions and recommendations are as follows.  Choledocholithiasis. S/P cholecystectomy and ERCP sphincterotomy in 2010. WBC improving on IV antibiotics. ERCP with stone extraction Saturday at 0730. Continue to hold Pletal.   Lucio Edward, MD [facility] See Shea Evans, [facility], for our on call provider

## 2021-10-08 NOTE — Progress Notes (Signed)
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   Subjective:  Report no appetite Appear weak, though does not appear in pain currently Hemodynamically stable No fever Family at bedside  Assessment & Plan:  Principal Problem:   Choledocholithiasis Active Problems:   Essential hypertension   Hypokalemia   Hypomagnesemia   LFT elevation   Hyponatremia    Assessment and Plan:  Biliary obstruction with dilated biliary track and stones in the common bile duct -History of cholecystectomy and CBD stones required ERCP in the past -with elevated lft, elevated WBC , no fever -continue hydration, empiric abxRocephin /Flagyl , as needed analgesics and antiemetics -GI would like to wait for platel wash out before proceed with ERCP , tentatively scheduled on 7/22  AKI on CKDII Cr peaked at 1.43, cr 0.79 on 7/21 urine culture no growth Continue hydration, already on antibiotics, renal dosing meds, avoid nephrotoxin, Hold Diovan HCTZ   Mild hyponatremia/hypokalemia/hypomagnesemia  Likely due to dehydration/poor oral intake Continue hydration hydration Improved Hold Diovan HCTZ  Leukocytosis Dehydration/stress/infection  blood culture/urine culture no growth Continue current antibiotics Continue hydration Repeat CBC in the morning   Hypertension Continue low-dose beta-blocker with holding parameters Hold Cardizem ,Norvasc and Diovan HCTZ   PAD On pletal, last taken on Monday, hold per gi request, resume when ok with GI   Spinal stenosis, walks with a walker, plan PT eval once  medically stable   Reports h/o overreactive bladder Bladder is full on Ct scan, bladder scan with 150cc with no urge to urinate, will hold oxybutynin/Myrbetriq Serial bladder scan     I have Reviewed nursing notes, Vitals, pain scores, I/o's, Lab results and  imaging results since pt's last encounter, details please see discussion above  I ordered the following labs:  Unresulted Labs (From admission, onward)     Start     Ordered   10/09/21 0500  CBC with Differential/Platelet  Tomorrow morning,   R       Question:  Specimen collection method  Answer:  Lab=Lab collect   10/08/21 1655   10/09/21 0500  Magnesium  Tomorrow morning,   R       Question:  Specimen collection method  Answer:  Lab=Lab collect   10/08/21 1655   10/08/21 0500  Comprehensive metabolic panel  Daily at 5am,   R     Question:  Specimen collection method  Answer:  Lab=Lab collect   10/07/21 1426             DVT prophylaxis: Place and maintain sequential compression device Start: 10/08/21 1250 SCDs Start: 10/06/21 1803 Place and maintain sequential compression device Start: 10/06/21 1732   Code Status:   Code Status: Full Code  Family Communication: Family at the bedside Disposition:    Dispo: The patient is from: Home              Anticipated d/c is to: Home              Anticipated d/c date is: TBD, need ERCP, need GI clearance  Antimicrobials:  Anti-infectives (From admission, onward)    Start     Dose/Rate Route Frequency Ordered Stop   10/06/21 1900  cefTRIAXone (ROCEPHIN) 2 g in sodium chloride 0.9 % 100 mL IVPB        2 g 200 mL/hr over 30 Minutes Intravenous Daily-1800 10/06/21 1805     10/06/21 1800  metroNIDAZOLE (FLAGYL) IVPB 500 mg        500 mg 100 mL/hr over 60 Minutes Intravenous Every 12 hours 10/06/21 1700            Objective: Vitals:   10/07/21 1224 10/07/21 2035 10/08/21 0434 10/08/21 1312  BP: (!) 141/71 136/70 (!) 160/77 (!) 115/96  Pulse: 87 86 92 90  Resp:  '17 17 18 20  '$ Temp: 97.8 F (36.6 C) 98.2 F (36.8 C) 98.7 F (37.1 C) 99.4 F (37.4 C)  TempSrc: Oral Oral Oral Oral  SpO2: 96% 96% 93% 97%  Weight:      Height:        Intake/Output Summary (Last 24 hours) at 10/08/2021 1655 Last data filed at 10/08/2021 1341 Gross per 24 hour  Intake 1621.37 ml  Output 1550 ml  Net 71.37 ml   Filed Weights   10/05/21 2220 10/06/21 1558  Weight: 72.6 kg 71.9 kg    Examination:  General exam: Sleepy, oriented x3, NAD Respiratory system: Clear to auscultation. Respiratory effort normal. Cardiovascular system:  RRR.  Gastrointestinal system: Abdomen is nondistended, soft and nontender.  Normal bowel sounds heard. Central nervous system: Alert and oriented. No focal neurological deficits. Extremities:  no edema Skin: No rashes, lesions or ulcers Psychiatry: Judgement and insight appear normal. Mood & affect appropriate.     Data Reviewed: I have personally reviewed  labs and visualized  imaging studies since the last encounter and formulate the plan        Scheduled Meds:  vitamin C  250 mg Oral Daily   cholecalciferol  2,000 Units Oral Daily   metoprolol tartrate  12.5 mg Oral BID   senna-docusate  1 tablet Oral BID   tamoxifen  20 mg Oral Daily   vitamin B-12  500 mcg Oral Daily   Continuous Infusions:  sodium chloride 20 mL/hr at 10/08/21 1341   cefTRIAXone (ROCEPHIN)  IV 2 g (10/07/21 1710)   lactated ringers 75 mL/hr at 10/08/21 1340   metronidazole 500 mg (10/08/21 0612)     LOS: 2 days      Florencia Reasons, MD PhD FACP [facility]  Available via Epic secure chat 7am-7pm for nonurgent issues Please page for urgent issues To page the attending provider between 7A-7P or the covering provider during after hours 7P-7A, please log into the web site [email or web address] and access using universal [place] password for that web site. If you do not have the password, please call the hospital operator.    10/08/2021, 4:55 PM

## 2021-10-08 NOTE — Progress Notes (Addendum)
Progress Note   Subjective  Chief Complaint: Choledocholithiasis  Patient stable this morning, tells me she does not want a lot of heavy food to eat today.  She remains slightly nauseous but in no pain.  Aware of plans for ERCP tomorrow.   Objective   Vital signs in last 24 hours: Temp:  [97.8 F (36.6 C)-98.7 F (37.1 C)] 98.7 F (37.1 C) (07/21 0434) Pulse Rate:  [86-92] 92 (07/21 0434) Resp:  [17-18] 18 (07/21 0434) BP: (136-160)/(70-77) 160/77 (07/21 0434) SpO2:  [93 %-96 %] 93 % (07/21 0434) Last BM Date : 10/05/21 General:    [name] in NAD Heart:  Regular rate and rhythm; no murmurs Lungs: Respirations even and unlabored, lungs CTA bilaterally Abdomen:  Soft, nontender and nondistended. Normal bowel sounds. Psych:  Cooperative. Normal mood and affect.  Intake/Output from previous day: 07/20 0701 - 07/21 0700 In: 2101.4 [P.O.:600; I.V.:1289.9; IV Piggyback:211.5] Out: 1150 [Urine:1150]   Lab Results: Recent Labs    10/06/21 1626 10/07/21 0437 10/08/21 0400  WBC 15.5* 23.4* 18.4*  HGB 12.1 10.9* 10.7*  HCT 34.4* 32.6* 30.3*  PLT 136* 121* 112*   BMET Recent Labs    10/07/21 0437 10/07/21 1516 10/08/21 0400  NA 135 132* 135  K 5.0 4.8 4.1  CL 104 104 103  CO2 19* 21* 23  GLUCOSE 156* 164* 119*  BUN 24* 25* 19  CREATININE 1.43* 1.04* 0.79  CALCIUM 8.9 8.5* 8.4*      Latest Ref Rng & Units 10/08/2021    4:00 AM 10/07/2021    4:37 AM 10/06/2021    4:26 PM  Hepatic Function  Total Protein 6.5 - 8.1 g/dL 6.3  6.9  7.0   Albumin 3.5 - 5.0 g/dL 2.4  2.7  2.7   AST 15 - 41 U/L 36  79  96   ALT 0 - 44 U/L 48  74  85   Alk Phosphatase 38 - 126 U/L 181  190  222   Total Bilirubin 0.3 - 1.2 mg/dL 1.4  2.8  5.5       Assessment / Plan:   Assessment: 1.  Choledocholithiasis: CT showing multiple stones in the common bile duct, history of cholecystectomy and previous ERCP with stone removal and sphincterotomy in 2010, LFTs elevated but trending  down, white count also trending down on antibiotics 2.  PAD: On Pletal-last dose 10/04/2021-on hold 3.  Spinal stenosis 4.  Mild hypokalemia: Corrected 5.  GERD  Plan: 1.  Patients ERCP is scheduled for tomorrow morning 10/09/2021 with Dr. Fuller Plan.  Risks and benefits have already been reviewed and patient agrees to proceed. 2.  Patient can be on heart healthy diet today as tolerated and n.p.o. at midnight 3.  Continue to hold Pletal (last dose reported Monday 7/17) 4.  We will need to hold evening dose of Lovenox tonight 5.  Continue analgesics and antiemetics as needed 6.  Agree with antibiotics  Thank you for your kind consultation, we will continue to follow   LOS: 2 days   Levin Erp  10/08/2021, 11:27 AM     Attending Physician Note   I have taken an interval history, reviewed the chart and examined the patient. I performed a substantive portion of this encounter, including complete performance of at least one of the key components, in conjunction with the APP. I agree with the APP's note, impression and recommendations with my edits. My additional impressions and recommendations are as follows.  Choledocholithiasis. S/P cholecystectomy and ERCP sphincterotomy in 2010. WBC improving on IV antibiotics. ERCP with stone extraction Saturday at 0730. Continue to hold Pletal.   Lucio Edward, MD [facility] See Shea Evans, [facility], for our on call provider

## 2021-10-08 NOTE — Evaluation (Signed)
Physical Therapy Evaluation Patient Details Name: Kristine Burnett MRN: [id number] DOB: 05/17/1929 Today's Date: 10/08/2021  History of Present Illness  88 y.o. 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  Clinical Impression  Pt admitted with above diagnosis. Pt ambulated 72' with RW with no loss of balance, distance limited by fatigue. She appears to be mobilizing near her baseline of independence with a rollator.  Pt currently with functional limitations due to the deficits listed below (see PT Problem List). Pt will benefit from skilled PT to increase their independence and safety with mobility to allow discharge to the venue listed below.          Recommendations for follow up therapy are one component of a multi-disciplinary discharge planning process, led by the attending physician.  Recommendations may be updated based on patient status, additional functional criteria and insurance authorization.  Follow Up Recommendations No PT follow up      Assistance Recommended at Discharge Set up Supervision/Assistance  Patient can return home with the following  Assistance with cooking/housework;Assist for transportation;Help with stairs or ramp for entrance    Equipment Recommendations None recommended by PT  Recommendations for Other Services       Functional Status Assessment Patient has had a recent decline in their functional status and demonstrates the ability to make significant improvements in function in a reasonable and predictable amount of time.     Precautions / Restrictions Precautions Precautions: Fall Precaution Comments: slipped out of chair ~1 week ago Restrictions Weight Bearing  Restrictions: No      Mobility  Bed Mobility Overal bed mobility: Modified Independent             General bed mobility comments: used bedrail, HOB up    Transfers Overall transfer level: Needs assistance Equipment used: Rolling walker (2 wheels) Transfers: Sit to/from Stand Sit to Stand: Min assist           General transfer comment: min A to power up from lowered bed, VCs hand placement    Ambulation/Gait Ambulation/Gait assistance: Min guard Gait Distance (Feet): 80 Feet Assistive device: Rolling walker (2 wheels) Gait Pattern/deviations: Step-through pattern, Decreased stride length, Trunk flexed Gait velocity: WFL     General Gait Details: VCs posture, no loss of balance  Stairs            Wheelchair Mobility    Modified Rankin (Stroke Patients Only)       Balance Overall balance assessment: Needs assistance, History of Falls Sitting-balance support: Feet supported, No upper extremity supported Sitting balance-Leahy Scale: Good     Standing balance support: Bilateral upper extremity supported, Reliant on assistive device for balance Standing balance-Leahy Scale: Poor                               Pertinent Vitals/Pain Pain Assessment Pain Assessment: No/denies pain    Home Living Family/patient expects to be discharged to:: Private residence Living Arrangements: Children Available Help at Discharge: Family;Available PRN/intermittently Type of Home: House Home Access: Stairs to enter Entrance Stairs-Rails: Left Entrance Stairs-Number of Steps: 2   Home Layout: One level Home Equipment: Rollator (4 wheels) Additional  Comments: lives with daugther who works. pt does not drive    Prior Function Prior Level of Function : Independent/Modified Independent;History of Falls (last six months)             Mobility Comments: walks with rollator ADLs Comments: sits on commode to bathe without assistance     Hand  Dominance        Extremity/Trunk Assessment   Upper Extremity Assessment Upper Extremity Assessment: Overall WFL for tasks assessed    Lower Extremity Assessment Lower Extremity Assessment: Overall WFL for tasks assessed    Cervical / Trunk Assessment Cervical / Trunk Assessment: Kyphotic  Communication   Communication: No difficulties  Cognition Arousal/Alertness: Awake/alert Behavior During Therapy: WFL for tasks assessed/performed Overall Cognitive Status: Within Functional Limits for tasks assessed                                          General Comments      Exercises     Assessment/Plan    PT Assessment Patient needs continued PT services  PT Problem List Decreased mobility;Decreased activity tolerance       PT Treatment Interventions Gait training;Therapeutic exercise;Balance training;Functional mobility training;Therapeutic activities;Patient/family education    PT Goals (Current goals can be found in the Care Plan section)  Acute Rehab PT Goals Patient Stated Goal: pt likes to do word searches PT Goal Formulation: With patient/family Time For Goal Achievement: 10/22/21 Potential to Achieve Goals: Good    Frequency Min 3X/week     Co-evaluation               AM-PAC PT "6 Clicks" Mobility  Outcome Measure Help needed turning from your back to your side while in a flat bed without using bedrails?: None Help needed moving from lying on your back to sitting on the side of a flat bed without using bedrails?: A Little Help needed moving to and from a bed to a chair (including a wheelchair)?: A Little Help needed standing up from a chair using your arms (e.g., wheelchair or bedside chair)?: A Little Help needed to walk in hospital room?: A Little Help needed climbing 3-5 steps with a railing? : A Little 6 Click Score: 19    End of Session Equipment Utilized During Treatment: Gait belt Activity Tolerance: Patient tolerated  treatment well;No increased pain Patient left: in chair;with chair alarm set;with family/visitor present;with call bell/phone within reach Nurse Communication: Mobility status PT Visit Diagnosis: Difficulty in walking, not elsewhere classified (R26.2);History of falling (Z91.81)    Time: 1103-1594 PT Time Calculation (min) (ACUTE ONLY): 25 min   Charges:   PT Evaluation $PT Eval Moderate Complexity: 1 Mod PT Treatments $Gait Training: 8-22 mins        Blondell Reveal Kistler PT 10/08/2021  Acute Rehabilitation Services  Office [phone]

## 2021-10-09 ENCOUNTER — Encounter (HOSPITAL_COMMUNITY)
Admission: EM | Disposition: A | Discharge status: Home / Self Care | Payer: Self-pay | Source: Home / Self Care | Attending: Internal Medicine

## 2021-10-09 ENCOUNTER — Encounter (HOSPITAL_COMMUNITY): Payer: Self-pay | Admitting: Internal Medicine

## 2021-10-09 ENCOUNTER — Inpatient Hospital Stay (HOSPITAL_COMMUNITY): Payer: Medicare HMO | Admitting: Anesthesiology

## 2021-10-09 ENCOUNTER — Inpatient Hospital Stay (HOSPITAL_COMMUNITY): Payer: Medicare HMO

## 2021-10-09 DIAGNOSIS — Z9049 Acquired absence of other specified parts of digestive tract: Secondary | ICD-10-CM

## 2021-10-09 DIAGNOSIS — K8050 Calculus of bile duct without cholangitis or cholecystitis without obstruction: Secondary | ICD-10-CM | POA: Diagnosis not present

## 2021-10-09 DIAGNOSIS — K8051 Calculus of bile duct without cholangitis or cholecystitis with obstruction: Secondary | ICD-10-CM

## 2021-10-09 DIAGNOSIS — I129 Hypertensive chronic kidney disease with stage 1 through stage 4 chronic kidney disease, or unspecified chronic kidney disease: Secondary | ICD-10-CM

## 2021-10-09 DIAGNOSIS — E1122 Type 2 diabetes mellitus with diabetic chronic kidney disease: Secondary | ICD-10-CM

## 2021-10-09 DIAGNOSIS — N182 Chronic kidney disease, stage 2 (mild): Secondary | ICD-10-CM

## 2021-10-09 HISTORY — PX: ERCP: SHX5425

## 2021-10-09 HISTORY — PX: SPHINCTEROTOMY: SHX5544

## 2021-10-09 HISTORY — PX: REMOVAL OF STONES: SHX5545

## 2021-10-09 LAB — BLOOD CULTURE ID PANEL (REFLEXED) - BCID2

## 2021-10-09 LAB — CBC WITH DIFFERENTIAL/PLATELET
Abs Immature Granulocytes: 0.1 10*3/uL — ABNORMAL HIGH (ref 0.00–0.07)
Basophils Absolute: 0.1 10*3/uL (ref 0.0–0.1)
Basophils Relative: 0 %
Eosinophils Absolute: 0.1 10*3/uL (ref 0.0–0.5)
Eosinophils Relative: 1 %
HCT: 28.3 % — ABNORMAL LOW (ref 36.0–46.0)
Hemoglobin: 10 g/dL — ABNORMAL LOW (ref 12.0–15.0)
Immature Granulocytes: 1 %
Lymphocytes Relative: 9 %
Lymphs Abs: 1.6 10*3/uL (ref 0.7–4.0)
MCH: 31.2 pg (ref 26.0–34.0)
MCHC: 35.3 g/dL (ref 30.0–36.0)
MCV: 88.2 fL (ref 80.0–100.0)
Monocytes Absolute: 1.2 10*3/uL — ABNORMAL HIGH (ref 0.1–1.0)
Monocytes Relative: 7 %
Neutro Abs: 14.2 10*3/uL — ABNORMAL HIGH (ref 1.7–7.7)
Neutrophils Relative %: 82 %
Platelets: 112 10*3/uL — ABNORMAL LOW (ref 150–400)
RBC: 3.21 MIL/uL — ABNORMAL LOW (ref 3.87–5.11)
RDW: 14.7 % (ref 11.5–15.5)
WBC: 17.3 10*3/uL — ABNORMAL HIGH (ref 4.0–10.5)
nRBC: 0 % (ref 0.0–0.2)

## 2021-10-09 LAB — COMPREHENSIVE METABOLIC PANEL
ALT: 34 U/L (ref 0–44)
AST: 21 U/L (ref 15–41)
Albumin: 2.2 g/dL — ABNORMAL LOW (ref 3.5–5.0)
Alkaline Phosphatase: 155 U/L — ABNORMAL HIGH (ref 38–126)
Anion gap: 7 (ref 5–15)
BUN: 12 mg/dL (ref 8–23)
CO2: 22 mmol/L (ref 22–32)
Calcium: 8 mg/dL — ABNORMAL LOW (ref 8.9–10.3)
Chloride: 104 mmol/L (ref 98–111)
Creatinine, Ser: 0.56 mg/dL (ref 0.44–1.00)
GFR, Estimated: >60 mL/min (ref >60)
Glucose, Bld: 130 mg/dL — ABNORMAL HIGH (ref 70–99)
Potassium: 4.2 mmol/L (ref 3.5–5.1)
Sodium: 133 mmol/L — ABNORMAL LOW (ref 135–145)
Total Bilirubin: 1.2 mg/dL (ref 0.3–1.2)
Total Protein: 5.9 g/dL — ABNORMAL LOW (ref 6.5–8.1)

## 2021-10-09 LAB — GLUCOSE, CAPILLARY: Glucose-Capillary: 162 mg/dL — ABNORMAL HIGH (ref 70–99)

## 2021-10-09 LAB — MAGNESIUM: Magnesium: 1.8 mg/dL (ref 1.7–2.4)

## 2021-10-09 SURGERY — ERCP, WITH INTERVENTION IF INDICATED
Anesthesia: General

## 2021-10-09 MED ORDER — GLUCAGON HCL RDNA (DIAGNOSTIC) 1 MG IJ SOLR
INTRAMUSCULAR | Status: AC
  Filled 2021-10-09: qty 2

## 2021-10-09 MED ORDER — FENTANYL CITRATE (PF) 100 MCG/2ML IJ SOLN
INTRAMUSCULAR | Status: DC | PRN
  Administered 2021-10-09: 50 ug via INTRAVENOUS

## 2021-10-09 MED ORDER — DILTIAZEM HCL 60 MG PO TABS
120.0000 mg | ORAL_TABLET | Freq: Every day | ORAL | Status: DC
  Administered 2021-10-09 – 2021-10-11 (×3): 120 mg via ORAL
  Filled 2021-10-09 (×3): qty 2

## 2021-10-09 MED ORDER — LIDOCAINE 2% (20 MG/ML) 5 ML SYRINGE
INTRAMUSCULAR | Status: DC | PRN
  Administered 2021-10-09: 40 mg via INTRAVENOUS

## 2021-10-09 MED ORDER — LACTATED RINGERS IV SOLN: INTRAVENOUS | Status: DC | PRN

## 2021-10-09 MED ORDER — METOPROLOL TARTRATE 25 MG PO TABS
25.0000 mg | ORAL_TABLET | Freq: Two times a day (BID) | ORAL | Status: DC
  Administered 2021-10-09: 25 mg via ORAL
  Filled 2021-10-09: qty 1

## 2021-10-09 MED ORDER — PROPOFOL 10 MG/ML IV BOLUS
INTRAVENOUS | Status: AC
  Filled 2021-10-09: qty 20

## 2021-10-09 MED ORDER — ROCURONIUM BROMIDE 50 MG/5ML IV SOSY
PREFILLED_SYRINGE | INTRAVENOUS | Status: DC | PRN
  Administered 2021-10-09: 40 mg via INTRAVENOUS

## 2021-10-09 MED ORDER — SUGAMMADEX SODIUM 200 MG/2ML IV SOLN
INTRAVENOUS | Status: DC | PRN
  Administered 2021-10-09: 150 mg via INTRAVENOUS

## 2021-10-09 MED ORDER — ONDANSETRON HCL 4 MG/2ML IJ SOLN
INTRAMUSCULAR | Status: DC | PRN
  Administered 2021-10-09: 4 mg via INTRAVENOUS

## 2021-10-09 MED ORDER — DICLOFENAC SUPPOSITORY 100 MG
100.0000 mg | Freq: Once | RECTAL | Status: AC
Start: 2021-10-09 — End: 2021-10-09
  Administered 2021-10-09: 100 mg via RECTAL
  Filled 2021-10-09: qty 1

## 2021-10-09 MED ORDER — DEXAMETHASONE SODIUM PHOSPHATE 10 MG/ML IJ SOLN
INTRAMUSCULAR | Status: DC | PRN
  Administered 2021-10-09: 4 mg via INTRAVENOUS

## 2021-10-09 MED ORDER — SODIUM CHLORIDE 0.9 % IV SOLN
INTRAVENOUS | Status: DC | PRN
  Administered 2021-10-09: 70 mL

## 2021-10-09 MED ORDER — GLUCAGON HCL RDNA (DIAGNOSTIC) 1 MG IJ SOLR
INTRAMUSCULAR | Status: DC | PRN
  Administered 2021-10-09: .25 mg via INTRAVENOUS

## 2021-10-09 MED ORDER — PROPOFOL 10 MG/ML IV BOLUS
INTRAVENOUS | Status: DC | PRN
  Administered 2021-10-09: 100 mg via INTRAVENOUS

## 2021-10-09 MED ORDER — PHENYLEPHRINE HCL-NACL 20-0.9 MG/250ML-% IV SOLN
INTRAVENOUS | Status: DC | PRN
  Administered 2021-10-09: 25 ug/min via INTRAVENOUS

## 2021-10-09 MED ORDER — AMLODIPINE BESYLATE 10 MG PO TABS
10.0000 mg | ORAL_TABLET | Freq: Every day | ORAL | Status: DC
  Administered 2021-10-09 – 2021-10-11 (×3): 10 mg via ORAL
  Filled 2021-10-09 (×3): qty 1

## 2021-10-09 MED ORDER — ALBUMIN HUMAN 5 % IV SOLN: INTRAVENOUS | Status: DC | PRN

## 2021-10-09 MED ORDER — POLYETHYLENE GLYCOL 3350 17 G PO PACK
17.0000 g | PACK | Freq: Every day | ORAL | Status: DC
  Administered 2021-10-09 – 2021-10-11 (×3): 17 g via ORAL
  Filled 2021-10-09 (×3): qty 1

## 2021-10-09 MED ORDER — LACTATED RINGERS IV SOLN
INTRAVENOUS | Status: DC
  Administered 2021-10-09: 1000 mL via INTRAVENOUS

## 2021-10-09 MED ORDER — FENTANYL CITRATE (PF) 100 MCG/2ML IJ SOLN
INTRAMUSCULAR | Status: AC
  Filled 2021-10-09: qty 2

## 2021-10-09 MED ORDER — DICLOFENAC SUPPOSITORY 100 MG
RECTAL | Status: AC
  Filled 2021-10-09: qty 1

## 2021-10-09 MED ORDER — DICLOFENAC SUPPOSITORY 100 MG
RECTAL | Status: DC | PRN
  Administered 2021-10-09: 100 mg via RECTAL

## 2021-10-09 NOTE — Interval H&P Note (Signed)
History and Physical Interval Note:  10/09/2021 7:36 AM  Kristine Burnett  has presented today for surgery, with the diagnosis of Choledocholithiasis.  The various methods of treatment have been discussed with the patient and family. After consideration of risks, benefits and other options for treatment, the patient has consented to  Procedure(s): ENDOSCOPIC RETROGRADE CHOLANGIOPANCREATOGRAPHY (ERCP) (N/A) as a surgical intervention.  The patient's history has been reviewed, patient examined, no change in status, stable for surgery.  I have reviewed the patient's chart and labs.  Questions were answered to the patient's satisfaction.     Pricilla Riffle. Fuller Plan

## 2021-10-09 NOTE — Anesthesia Postprocedure Evaluation (Signed)
Anesthesia Post Note  Patient: Kristine Burnett  Procedure(s) Performed: ENDOSCOPIC RETROGRADE CHOLANGIOPANCREATOGRAPHY (ERCP) SPHINCTEROTOMY REMOVAL OF STONES     Patient location during evaluation: PACU Anesthesia Type: General Level of consciousness: awake and alert Pain management: pain level controlled Vital Signs Assessment: post-procedure vital signs reviewed and stable Respiratory status: spontaneous breathing, nonlabored ventilation, respiratory function stable and patient connected to nasal cannula oxygen Cardiovascular status: blood pressure returned to baseline and stable Postop Assessment: no apparent nausea or vomiting Anesthetic complications: no   No notable events documented.  Last Vitals:  Vitals:   10/09/21 0915 10/09/21 1016  BP: (!) 157/75 (!) 166/78  Pulse: 79 82  Resp: 16 18  Temp:  36.9 C  SpO2: 100% 96%    Last Pain:  Vitals:   10/09/21 1000  TempSrc:   PainSc: 0-No pain                 Kristine Burnett

## 2021-10-09 NOTE — Progress Notes (Signed)
PHARMACY - PHYSICIAN COMMUNICATION CRITICAL VALUE ALERT - BLOOD CULTURE IDENTIFICATION (BCID)  Kristine Burnett is an 88 y.o. female who presented to [facility] on 10/06/2021 with a chief complaint of abdominal pain and nausea.  Assessment:   Admit with biliary obstruction with dilated biliary track and stones in the common bile duct.  Plan for ERCP later today.  1/2 blood cx sets + GNR; BCID + Ecoli with no resistance.  Urine cx was negative.   Name of physician (or Provider) Contacted: Elgergawy  Current antibiotics:  Ceftriaxone 2gm IV q24h + Flagyl '500mg'$  IV q12h  Changes to prescribed antibiotics recommended:  Patient is on recommended antibiotics - No changes needed  Results for orders placed or performed during the hospital encounter of 10/06/21  Blood Culture ID Panel (Reflexed) (Collected: 10/07/2021  3:16 PM)  Result Value Ref Range   Enterococcus faecalis NOT DETECTED NOT DETECTED   Enterococcus Faecium NOT DETECTED NOT DETECTED   Listeria monocytogenes NOT DETECTED NOT DETECTED   Staphylococcus species NOT DETECTED NOT DETECTED   Staphylococcus aureus (BCID) NOT DETECTED NOT DETECTED   Staphylococcus epidermidis NOT DETECTED NOT DETECTED   Staphylococcus lugdunensis NOT DETECTED NOT DETECTED   Streptococcus species NOT DETECTED NOT DETECTED   Streptococcus agalactiae NOT DETECTED NOT DETECTED   Streptococcus pneumoniae NOT DETECTED NOT DETECTED   Streptococcus pyogenes NOT DETECTED NOT DETECTED   A.calcoaceticus-baumannii NOT DETECTED NOT DETECTED   Bacteroides fragilis NOT DETECTED NOT DETECTED   Enterobacterales DETECTED (A) NOT DETECTED   Enterobacter cloacae complex NOT DETECTED NOT DETECTED   Escherichia coli DETECTED (A) NOT DETECTED   Klebsiella aerogenes NOT DETECTED NOT DETECTED   Klebsiella oxytoca NOT DETECTED NOT DETECTED   Klebsiella pneumoniae NOT DETECTED NOT DETECTED   Proteus species NOT DETECTED NOT DETECTED   Salmonella species NOT DETECTED NOT  DETECTED   Serratia marcescens NOT DETECTED NOT DETECTED   Haemophilus influenzae NOT DETECTED NOT DETECTED   Neisseria meningitidis NOT DETECTED NOT DETECTED   Pseudomonas aeruginosa NOT DETECTED NOT DETECTED   Stenotrophomonas maltophilia NOT DETECTED NOT DETECTED   Candida albicans NOT DETECTED NOT DETECTED   Candida auris NOT DETECTED NOT DETECTED   Candida glabrata NOT DETECTED NOT DETECTED   Candida krusei NOT DETECTED NOT DETECTED   Candida parapsilosis NOT DETECTED NOT DETECTED   Candida tropicalis NOT DETECTED NOT DETECTED   Cryptococcus neoformans/gattii NOT DETECTED NOT DETECTED   CTX-M ESBL NOT DETECTED NOT DETECTED   Carbapenem resistance IMP NOT DETECTED NOT DETECTED   Carbapenem resistance KPC NOT DETECTED NOT DETECTED   Carbapenem resistance NDM NOT DETECTED NOT DETECTED   Carbapenem resist OXA 48 LIKE NOT DETECTED NOT DETECTED   Carbapenem resistance VIM NOT DETECTED NOT DETECTED    Netta Cedars PharmD 10/09/2021  1:45 AM

## 2021-10-09 NOTE — Op Note (Signed)
[facility] Patient Name: Kristine Burnett Procedure Date: 10/09/2021 MRN: [id number] Attending MD: Ladene Artist , MD Date of Birth: 1930-01-12 CSN: [id number] Age: 88 Admit Type: Inpatient Procedure:                ERCP Indications:              Bile duct stones, Elevated liver enzymes Providers:                Pricilla Riffle. Fuller Plan, MD, Burtis Junes, RN, Gloris Ham, Technician Referring MD:             [facility] Medicines:                General Anesthesia Complications:            No immediate complications. Estimated Blood Loss:     Estimated blood loss was minimal. Procedure:                Pre-Anesthesia Assessment:                           - Prior to the procedure, a History and Physical                            was performed, and patient medications and                            allergies were reviewed. The patient's tolerance of                            previous anesthesia was also reviewed. The risks                            and benefits of the procedure and the sedation                            options and risks were discussed with the patient.                            All questions were answered, and informed consent                            was obtained. Prior Anticoagulants: The patient has                            taken Pletal (cilostazol), last dose was 5 days                            prior to procedure. ASA Grade Assessment: III - A                            patient with severe systemic disease. After  reviewing the risks and benefits, the patient was                            deemed in satisfactory condition to undergo the                            procedure.                           After obtaining informed consent, the scope was                            passed under direct vision. Throughout the                            procedure, the patient's blood pressure, pulse, and                             oxygen saturations were monitored continuously. The                            [employer] single use                            duodenoscope was introduced through the mouth, and                            used to inject contrast into and used to inject                            contrast into the bile duct. The ERCP was                            accomplished without difficulty. The patient                            tolerated the procedure well. Scope In: Scope Out: Findings:      A scout film of the abdomen was obtained. Surgical clips, consistent       with a previous cholecystectomy, were seen in the area of the right       upper quadrant of the abdomen. The scope was advanced to a normal major       papilla in the descending duodenum. Very limited examination of the       pharynx, larynx and associated structures, and upper GI tract appeared       normal. Small prior sphinctertomy noted and otherwise the major papilla       appeared normal. A 0.035 inch x 260 cm straight Hydra Jagwire was passed       into the biliary tree. The short-nosed traction sphincterotome was       passed over the guidewire and the bile duct was then deeply cannulated.       Contrast was injected. I personally interpreted the bile duct images.       Ductal flow of contrast was adequate. The common bile duct was diffusely  dilated, with distal CBD multiple stones causing an obstruction. The       largest CBD diameter was 15 mm. A cholecystectomy had been performed.       The common bile duct contained filling defects thought to be stones and       sludge. An 8 mm biliary sphincterotomy extention was made with a       traction (standard) sphincterotome using ERBE electrocautery. The       sphincterotomy oozed blood briefly during a couple balloon pull throughs       and stopped. The biliary tree was swept multiple times with a 12 mm       balloon starting at the  bifurcation. Sludge was swept from the duct. All       stones were removed. The final occulsion cholangiogram appeared clear.       Very good biliary drainage was then noted. The PD was not cannulated or       injected by intention. Impression:               - Prior small sphincterotomy noted.                           - Multiple filling defects consistent with stones                            and sludge were seen on the cholangiogram.                           - The common bile duct was dilated, with stones                            causing an obstruction.                           - Prior cholecystectomy.                           - Choledocholithiasis was found. Complete removal                            was accomplished by biliary sphincterotomy                            extension and balloon extraction.                           - A biliary sphincterotomy extension was performed.                           - The biliary tree was swept. Moderate Sedation:      Not Applicable - Patient had care per Anesthesia. Recommendation:           - Avoid aspirin and nonsteroidal anti-inflammatory                            medicines for 1 week.                           -  Return patient to hospital ward for ongoing care.                           - Observe patient's clinical course following                            today's ERCP with therapeutic intervention.                           - Continue IV antibiotics and when WBC improves                            change to PO antibiotics to complete at least 7                            more days.                           - OK to resume Pletal as clinically indicated in 1                            week.                           - Trend LFTs.                           - GI follow up with Dr. Havery Moros prn. Procedure Code(s):        --- Professional ---                           [phone], Endoscopic retrograde                             cholangiopancreatography (ERCP); with removal of                            calculi/debris from biliary/pancreatic duct(s)                           43262, Endoscopic retrograde                            cholangiopancreatography (ERCP); with                            sphincterotomy/papillotomy Diagnosis Code(s):        --- Professional ---                           K80.51, Calculus of bile duct without cholangitis                            or cholecystitis with obstruction                           Z90.49, Acquired absence of other specified parts  of digestive tract                           R74.8, Abnormal levels of other serum enzymes                           R93.2, Abnormal findings on diagnostic imaging of                            liver and biliary tract CPT copyright 2019 [employer] Association. All rights reserved. The codes documented in this report are preliminary and upon coder review may  be revised to meet current compliance requirements. Ladene Artist, MD 10/09/2021 8:56:44 AM This report has been signed electronically. Number of Addenda: 0

## 2021-10-09 NOTE — Progress Notes (Signed)
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   Subjective:  Returned from ercp, states feeling much better, denies pain, no fever Appetite has improved, no /nv,   blood pressure elevated    Family at bedside  Assessment & Plan:  Principal Problem:   Choledocholithiasis Active Problems:   Essential hypertension   Hypokalemia   Hypomagnesemia   LFT elevation   Hyponatremia    Assessment and Plan:  Biliary obstruction with dilated biliary track and stones in the common bile duct -History of cholecystectomy and CBD stones required ERCP in the past -with elevated lft, elevated WBC on presentation , no fever -received hydration, empiric abxRocephin /Flagyl , as needed analgesics and antiemetics -s/p ERCP stone extraction on 7/22, plan per GI  Leukocytosis Dehydration/stress/infection  blood culture/urine culture no growth Continue current antibiotics Encourage oral intake  Repeat CBC in the morning  AKI on CKDII Cr peaked at 1.43, cr 0.56 today urine culture no growth  renal dosing meds, avoid nephrotoxin, Hold Diovan HCTZ   Mild hyponatremia/hypokalemia/hypomagnesemia  Likely due to dehydration/poor oral intake Received hydration Improved Hold Diovan HCTZ, encourage oral intake    Hypertension Initially bp low normal, bp start to trend up, gradually resume home bp meds    PAD On pletal, last taken on Monday, hold per gi request, resume in one week post ERCP per  GI   Spinal stenosis, walks with a walker, did well with PT,    Reports h/o overreactive  bladder Bladder is full on Ct scan, bladder scan with 150cc with no urge to urinate, will hold oxybutynin/Myrbetriq Serial bladder scan     I have Reviewed nursing notes, Vitals, pain scores, I/o's, Lab results and  imaging results since pt's last encounter, details please see discussion above  I ordered the following labs:  Unresulted Labs (From admission, onward)     Start     Ordered   10/10/21 0500  CBC with Differential/Platelet  Tomorrow morning,   R       Question:  Specimen collection method  Answer:  Lab=Lab collect   10/09/21 1312   10/10/21 0500  Magnesium  Tomorrow morning,   R       Question:  Specimen collection method  Answer:  Lab=Lab collect   10/09/21 1312   10/08/21 0500  Comprehensive metabolic panel  Daily at 5am,   R     Question:  Specimen collection method  Answer:  Lab=Lab collect   07/Burnett/23 1426             DVT prophylaxis: Place and maintain sequential compression device Start: 10/08/21 1250 SCDs Start: 10/06/21 1803 Place and maintain sequential compression device Start: 10/06/21 1732   Code Status:   Code Status: Full Code  Family Communication: Family at the bedside Disposition:   Dispo: The patient is from: Home              Anticipated d/c is to: Home              Anticipated d/c date is: possible tomorrow, need GI clearance  Antimicrobials:    Anti-infectives (From admission, onward)    Start     Dose/Rate Route Frequency Ordered Stop   10/06/21 1900  cefTRIAXone (ROCEPHIN) 2 g in sodium chloride 0.9 % 100 mL IVPB        2 g 200 mL/hr over 30 Minutes Intravenous Daily-1800 10/06/21 1805     10/06/21 1800  metroNIDAZOLE (FLAGYL) IVPB 500 mg        500 mg 100 mL/hr over 60 Minutes Intravenous Every 12 hours 10/06/21 1700            Objective: Vitals:   10/09/21 0902 10/09/21 0915 10/09/21 1016 10/09/21 1321  BP:  (!) 157/75 (!) 166/78 (!) 170/83  Pulse: 80 79 82 83  Resp: (!) '23 16 18 '$ (!) 22  Temp:   98.5 F (36.9 C)  98.3 F (36.8 C)  TempSrc:      SpO2: 100% 100% 96% 97%  Weight:      Height:        Intake/Output Summary (Last 24 hours) at 10/09/2021 1324 Last data filed at 10/09/2021 1300 Gross per 24 hour  Intake 2242.43 ml  Output 2850 ml  Net -607.57 ml   Filed Weights   10/05/21 2220 10/06/21 1558  Weight: 72.6 kg 71.9 kg    Examination:  General exam: appear stronger, oriented x3, NAD Respiratory system: Clear to auscultation. Respiratory effort normal. Cardiovascular system:  RRR.  Gastrointestinal system: Abdomen is nondistended, soft and nontender.  Normal bowel sounds heard. Central nervous system: Alert and oriented. No focal neurological deficits. Extremities:  no edema Skin: No rashes, lesions or ulcers Psychiatry: Judgement and insight appear normal. Mood & affect appropriate.     Data Reviewed: I have personally reviewed  labs and visualized  imaging studies since the last encounter and formulate the plan        Scheduled Meds:  amLODipine  10 mg Oral Daily   vitamin C  250 mg Oral Daily   cholecalciferol  2,000 Units Oral Daily   diclofenac  100 mg Rectal Once   diltiazem  120 mg Oral Daily   metoprolol tartrate  12.5 mg Oral BID   polyethylene glycol  17 g Oral Daily   senna-docusate  1 tablet Oral BID   tamoxifen  Burnett mg Oral Daily   vitamin B-12  500 mcg Oral Daily   Continuous Infusions:  cefTRIAXone (ROCEPHIN)  IV 2 g (10/08/21 1710)   lactated ringers 75 mL/hr at 10/08/21 1340   metronidazole 500 mg (10/09/21 0507)     LOS: 3 days      Florencia Reasons, MD PhD FACP [facility]  Available via Epic secure chat 7am-7pm for nonurgent issues Please page for urgent issues To page the attending provider between 7A-7P or the covering provider during after hours 7P-7A, please log into the web site [email or web address] and access using universal [place] password for that web site. If you do not have the password, please call the hospital  operator.    10/09/2021, 1:24 PM

## 2021-10-09 NOTE — Transfer of Care (Signed)
Immediate Anesthesia Transfer of Care Note  Patient: Kristine Burnett  Procedure(s) Performed: ENDOSCOPIC RETROGRADE CHOLANGIOPANCREATOGRAPHY (ERCP) SPHINCTEROTOMY REMOVAL OF STONES  Patient Location: PACU  Anesthesia Type:General  Level of Consciousness: awake and patient cooperative  Airway & Oxygen Therapy: Patient Spontanous Breathing and Patient connected to face mask oxygen  Post-op Assessment: Report given to RN and Post -op Vital signs reviewed and stable  Post vital signs: Reviewed and stable  Last Vitals:  Vitals Value Taken Time  BP    Temp    Pulse 80 10/09/21 0901  Resp 17 10/09/21 0901  SpO2 100 % 10/09/21 0901  Vitals shown include unvalidated device data.  Last Pain:  Vitals:   10/09/21 0721  TempSrc: Temporal  PainSc: 0-No pain      Patients Stated Pain Goal: 2 (78/29/56 2130)  Complications: No notable events documented.

## 2021-10-09 NOTE — Anesthesia Preprocedure Evaluation (Signed)
Anesthesia Evaluation  Patient identified by MRN, date of birth, ID band Patient awake    Reviewed: Allergy & Precautions, NPO status , Patient's Chart, lab work & pertinent test results  Airway Mallampati: II  TM Distance: >3 FB Neck ROM: Full    Dental  (+) Dental Advisory Given   Pulmonary asthma ,    breath sounds clear to auscultation       Cardiovascular hypertension, Pt. on medications + Peripheral Vascular Disease   Rhythm:Regular Rate:Normal     Neuro/Psych negative neurological ROS     GI/Hepatic negative GI ROS, Neg liver ROS,   Endo/Other  diabetes, Type 2  Renal/GU CRFRenal disease     Musculoskeletal  (+) Arthritis ,   Abdominal   Peds  Hematology  (+) Blood dyscrasia, anemia ,   Anesthesia Other Findings   Reproductive/Obstetrics                             Anesthesia Physical Anesthesia Plan  ASA: 3  Anesthesia Plan: General   Post-op Pain Management: Minimal or no pain anticipated   Induction: Intravenous  PONV Risk Score and Plan: 3 and Dexamethasone, Ondansetron and Treatment may vary due to age or medical condition  Airway Management Planned: Oral ETT  Additional Equipment: None  Intra-op Plan:   Post-operative Plan: Extubation in OR  Informed Consent: I have reviewed the patients History and Physical, chart, labs and discussed the procedure including the risks, benefits and alternatives for the proposed anesthesia with the patient or authorized representative who has indicated his/her understanding and acceptance.     Dental advisory given  Plan Discussed with: CRNA  Anesthesia Plan Comments:         Anesthesia Quick Evaluation

## 2021-10-09 NOTE — Anesthesia Procedure Notes (Signed)
Procedure Name: Intubation Date/Time: 10/09/2021 7:47 AM  Performed by: Renato Shin, CRNAPre-anesthesia Checklist: Patient identified, Emergency Drugs available, Suction available and Patient being monitored Patient Re-evaluated:Patient Re-evaluated prior to induction Oxygen Delivery Method: Circle system utilized Preoxygenation: Pre-oxygenation with 100% oxygen Induction Type: IV induction Ventilation: Mask ventilation without difficulty Laryngoscope Size: Miller and 2 Grade View: Grade I Tube type: Oral Tube size: 7.0 mm Number of attempts: 1 Airway Equipment and Method: Stylet and Oral airway Placement Confirmation: ETT inserted through vocal cords under direct vision, positive ETCO2 and breath sounds checked- equal and bilateral Secured at: 21 cm Tube secured with: Tape Dental Injury: Teeth and Oropharynx as per pre-operative assessment

## 2021-10-10 DIAGNOSIS — K831 Obstruction of bile duct: Secondary | ICD-10-CM | POA: Diagnosis not present

## 2021-10-10 DIAGNOSIS — K8031 Calculus of bile duct with cholangitis, unspecified, with obstruction: Secondary | ICD-10-CM | POA: Diagnosis not present

## 2021-10-10 DIAGNOSIS — K8050 Calculus of bile duct without cholangitis or cholecystitis without obstruction: Secondary | ICD-10-CM | POA: Diagnosis not present

## 2021-10-10 LAB — COMPREHENSIVE METABOLIC PANEL
ALT: 31 U/L (ref 0–44)
AST: 20 U/L (ref 15–41)
Albumin: 2.8 g/dL — ABNORMAL LOW (ref 3.5–5.0)
Alkaline Phosphatase: 171 U/L — ABNORMAL HIGH (ref 38–126)
Anion gap: 8 (ref 5–15)
BUN: 18 mg/dL (ref 8–23)
CO2: 23 mmol/L (ref 22–32)
Calcium: 8.6 mg/dL — ABNORMAL LOW (ref 8.9–10.3)
Chloride: 103 mmol/L (ref 98–111)
Creatinine, Ser: 0.68 mg/dL (ref 0.44–1.00)
GFR, Estimated: >60 mL/min (ref >60)
Glucose, Bld: 260 mg/dL — ABNORMAL HIGH (ref 70–99)
Potassium: 4.3 mmol/L (ref 3.5–5.1)
Sodium: 134 mmol/L — ABNORMAL LOW (ref 135–145)
Total Bilirubin: 1 mg/dL (ref 0.3–1.2)
Total Protein: 7.3 g/dL (ref 6.5–8.1)

## 2021-10-10 LAB — CBC WITH DIFFERENTIAL/PLATELET
Abs Immature Granulocytes: 0.16 10*3/uL — ABNORMAL HIGH (ref 0.00–0.07)
Basophils Absolute: 0.1 10*3/uL (ref 0.0–0.1)
Basophils Relative: 0 %
Eosinophils Absolute: 0 10*3/uL (ref 0.0–0.5)
Eosinophils Relative: 0 %
HCT: 31.2 % — ABNORMAL LOW (ref 36.0–46.0)
Hemoglobin: 11.2 g/dL — ABNORMAL LOW (ref 12.0–15.0)
Immature Granulocytes: 1 %
Lymphocytes Relative: 9 %
Lymphs Abs: 1.7 10*3/uL (ref 0.7–4.0)
MCH: 31.6 pg (ref 26.0–34.0)
MCHC: 35.9 g/dL (ref 30.0–36.0)
MCV: 88.1 fL (ref 80.0–100.0)
Monocytes Absolute: 1.2 10*3/uL — ABNORMAL HIGH (ref 0.1–1.0)
Monocytes Relative: 6 %
Neutro Abs: 16.6 10*3/uL — ABNORMAL HIGH (ref 1.7–7.7)
Neutrophils Relative %: 84 %
Platelets: 140 10*3/uL — ABNORMAL LOW (ref 150–400)
RBC: 3.54 MIL/uL — ABNORMAL LOW (ref 3.87–5.11)
RDW: 14.6 % (ref 11.5–15.5)
WBC: 19.8 10*3/uL — ABNORMAL HIGH (ref 4.0–10.5)
nRBC: 0 % (ref 0.0–0.2)

## 2021-10-10 LAB — MAGNESIUM: Magnesium: 1.8 mg/dL (ref 1.7–2.4)

## 2021-10-10 MED ORDER — ENOXAPARIN SODIUM 40 MG/0.4ML IJ SOSY
40.0000 mg | PREFILLED_SYRINGE | INTRAMUSCULAR | Status: DC
  Administered 2021-10-10: 40 mg via SUBCUTANEOUS
  Filled 2021-10-10: qty 0.4

## 2021-10-10 MED ORDER — MAGIC MOUTHWASH
10.0000 mL | Freq: Four times a day (QID) | ORAL | Status: DC | PRN
  Administered 2021-10-10: 10 mL via ORAL
  Filled 2021-10-10 (×2): qty 10

## 2021-10-10 MED ORDER — IRBESARTAN 150 MG PO TABS
150.0000 mg | ORAL_TABLET | Freq: Every day | ORAL | Status: DC
  Administered 2021-10-10 – 2021-10-11 (×2): 150 mg via ORAL
  Filled 2021-10-10 (×2): qty 1

## 2021-10-10 MED ORDER — ATENOLOL 50 MG PO TABS
100.0000 mg | ORAL_TABLET | Freq: Every day | ORAL | Status: DC
  Administered 2021-10-10 – 2021-10-11 (×2): 100 mg via ORAL
  Filled 2021-10-10 (×2): qty 2

## 2021-10-10 MED ORDER — VALSARTAN-HYDROCHLOROTHIAZIDE 160-25 MG PO TABS: 1.0000 | ORAL_TABLET | Freq: Every day | ORAL | Status: DC

## 2021-10-10 MED ORDER — HYDROCHLOROTHIAZIDE 25 MG PO TABS
25.0000 mg | ORAL_TABLET | Freq: Every day | ORAL | Status: DC
  Administered 2021-10-10 – 2021-10-11 (×2): 25 mg via ORAL
  Filled 2021-10-10 (×2): qty 1

## 2021-10-10 MED ORDER — MAGNESIUM GLUCONATE 500 MG PO TABS
500.0000 mg | ORAL_TABLET | Freq: Every evening | ORAL | Status: DC
  Administered 2021-10-10: 500 mg via ORAL
  Filled 2021-10-10 (×2): qty 1

## 2021-10-10 NOTE — Progress Notes (Signed)
    Progress Note   Assessment    Choledocholithiasis, cholangitis. ERCP sphincterotomy, stone extraction yesterday with multiple CBD stones and sludge removed. Blood cultures positive for E coli. WBC remains elevated. Sweat, chill, LLQ pain this morning without fever.  S/P cholecystectomy.   Recommendations   Continue IV antibiotics until WBC improves and at least 1 more week of antibiotics. Change to PO at discharge.  OK to resume Pletal in 6 days Advance diet today GI signing off, available if needed GI follow up with Dr. Havery Moros   Chief Complaint   Sweat with chill this morning with mild LLQ pain all symptoms have resolved.   Vital signs in last 24 hours: Temp:  [98.3 F (36.8 C)-99 F (37.2 C)] 98.3 F (36.8 C) (07/23 0500) Pulse Rate:  [71-83] 76 (07/23 0535) Resp:  [18-22] 22 (07/23 0500) BP: (138-170)/(63-83) 162/77 (07/23 0535) SpO2:  [96 %-100 %] 98 % (07/23 0500) Last BM Date : 10/09/21  General: Alert, well-developed, in NAD Heart:  Regular rate and rhythm; no murmurs Chest: Clear to ascultation bilaterally Abdomen:  Soft, nontender and nondistended. Normal bowel sounds, without guarding, and without rebound.   Extremities:  Without edema. Neurologic:  Alert and  oriented x4; grossly normal neurologically. Psych:  Alert and cooperative. Normal mood and affect.  Intake/Output from previous day: 07/22 0701 - 07/23 0700 In: 2203.8 [P.O.:1440; I.V.:400; IV Piggyback:363.8] Out: 1650 [Urine:1650] Intake/Output this shift: Total I/O In: 360 [P.O.:360] Out: -   Lab Results: Recent Labs    10/08/21 0400 10/09/21 0511 10/10/21 0422  WBC 18.4* 17.3* 19.8*  HGB 10.7* 10.0* 11.2*  HCT 30.3* 28.3* 31.2*  PLT 112* 112* 140*   BMET Recent Labs    10/08/21 0400 10/09/21 0511 10/10/21 0422  NA 135 133* 134*  K 4.1 4.2 4.3  CL 103 104 103  CO2 '23 22 23  '$ GLUCOSE 119* 130* 260*  BUN '19 12 18  '$ CREATININE 0.79 0.56 0.68  CALCIUM 8.4* 8.0* 8.6*    LFT Recent Labs    10/10/21 0422  PROT 7.3  ALBUMIN 2.8*  AST 20  ALT 31  ALKPHOS 171*  BILITOT 1.0     Studies/Results: DG ERCP  Result Date: 10/09/2021 CLINICAL DATA:  Choledocholithiasis. EXAM: ERCP COMPARISON:  CT AP 10/07/2018.  ERCP, 01/01/2009. FLUOROSCOPY: Exposure Index (as provided by the fluoroscopic device): 127.9 mGy Kerma FINDINGS: Multiple, limited oblique planar images of the RIGHT upper quadrant obtained C-arm. Images demonstrating flexible endoscopy, biliary duct cannulation, retrograde cholangiogram and balloon sweeps. Intra and extrahepatic biliary ductal dilation. multiple biliary filling defects are demonstrated. IMPRESSION: Fluoroscopic imaging for ERCP. Biliary ductal dilation with multiple filling defects are demonstrated. For complete description of intra procedural findings, please see performing service dictation. Electronically Signed   By: Michaelle Birks M.D.   On: 10/09/2021 20:16   DG C-Arm 1-60 Min-No Report  Result Date: 10/09/2021 Fluoroscopy was utilized by the requesting physician.  No radiographic interpretation.      LOS: 4 days   Norberto Sorenson T. Fuller Plan, MD 10/10/2021, 9:27 AM See [facility], to contact our on call provider

## 2021-10-10 NOTE — Progress Notes (Addendum)
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   Subjective:  Reports woke up in sweat, reports gas pain left lower ab early this am  otherwise, no fever, no pain, no n/v, she prefers to stay on liquid diet   blood pressure elevated , blood culture + ecoli, wbc remains elevated   Assessment & Plan:  Principal Problem:   Choledocholithiasis Active Problems:   Essential hypertension   Hypokalemia   Hypomagnesemia   LFT elevation   Hyponatremia    Assessment and Plan:  Biliary obstruction with dilated biliary track and stones in the common bile duct -History of cholecystectomy and CBD stones required ERCP in the past -with elevated lft, elevated WBC on presentation , no fever -received hydration, empiric abxRocephin /Flagyl , as needed analgesics and antiemetics -s/p ERCP stone extraction on 7/22, plan per GI  Ecoli bacteremia -persistent leukocytosis -continue current abx, f/u on culture result  AKI on CKDII Cr peaked at 1.43, cr normalized.  urine culture no growth  renal dosing meds, avoid nephrotoxin, bp elevated, resume home meds Diovan HCTZ Repeat bmp in am   Mild hyponatremia/hypokalemia/hypomagnesemia  Likely due to dehydration/poor oral intake Improved after hydration Bp elevated,  resume diovan/hctz on 7/23  encourage oral intake Repeat bmp in am   Hypertension Initially bp low normal, bp start to trend up, gradually resume home bp meds  Hyperglycemia No prior h/o diabetes, check a1c    PAD On pletal, last taken on Monday, hold per gi request, resume in  one week post ERCP per  GI   Spinal stenosis, walks with a walker, did well with PT,    Reports h/o overreactive bladder Bladder is full on Ct scan, bladder scan with 150cc with no urge to urinate, ( could be from narcotic initially) Repeat bladder scan on 7/23 am 80cc in the bladder will hold oxybutynin/Myrbetriq Consider urology follow up after discharge     I have Reviewed nursing notes, Vitals, pain scores, I/o's, Lab results and  imaging results since pt's last encounter, details please see discussion above  I ordered the following labs:  Unresulted Labs (From admission, onward)     Start     Ordered   10/11/21 0500  CBC with Differential/Platelet  Tomorrow morning,   R       Question:  Specimen collection method  Answer:  Lab=Lab collect   10/10/21 0814   10/11/21 7035  Basic metabolic panel  Tomorrow morning,   R       Question:  Specimen collection method  Answer:  Lab=Lab collect   10/10/21 0814   10/11/21 0500  Magnesium  Tomorrow morning,   R       Question:  Specimen collection method  Answer:  Lab=Lab collect   10/10/21 0814   10/11/21 0500  Hemoglobin A1c  Tomorrow morning,   R       Question:  Specimen collection method  Answer:  Lab=Lab collect   10/10/21 0814             DVT prophylaxis: Place and maintain sequential  compression device Start: 10/08/21 1250 SCDs Start: 10/06/21 1803 Place and maintain sequential compression device Start: 10/06/21 1732   Code Status:   Code Status: Full Code  Family Communication: Family at the bedside on 7/19,20,21,22. Disposition:   Dispo: The patient is from: Home              Anticipated d/c is to: Home              Anticipated d/c date is: 24-48hrs, pending wbc improvement and blood culture result   Antimicrobials:    Anti-infectives (From admission, onward)    Start     Dose/Rate Route Frequency Ordered Stop   10/06/21 1900  cefTRIAXone (ROCEPHIN) 2 g in sodium chloride 0.9 % 100 mL IVPB        2 g 200  mL/hr over 30 Minutes Intravenous Daily-1800 10/06/21 1805     10/06/21 1800  metroNIDAZOLE (FLAGYL) IVPB 500 mg        500 mg 100 mL/hr over 60 Minutes Intravenous Every 12 hours 10/06/21 1700            Objective: Vitals:   10/09/21 1858 10/09/21 2003 10/10/21 0500 10/10/21 0535  BP: 139/82 138/63 (!) 164/72 (!) 162/77  Pulse: 71 74 79 76  Resp: 20 18 (!) 22   Temp: 98.3 F (36.8 C) 99 F (37.2 C) 98.3 F (36.8 C)   TempSrc: Oral Oral Oral   SpO2: 100% 99% 98%   Weight:      Height:        Intake/Output Summary (Last 24 hours) at 10/10/2021 0937 Last data filed at 10/10/2021 0900 Gross per 24 hour  Intake 1913.83 ml  Output 1650 ml  Net 263.83 ml   Filed Weights   10/05/21 2220 10/06/21 1558  Weight: 72.6 kg 71.9 kg    Examination:  General exam: oriented x3, NAD Respiratory system: Clear to auscultation. Respiratory effort normal. Cardiovascular system:  RRR.  Gastrointestinal system: Abdomen is nondistended, soft and nontender.  Normal bowel sounds heard. Central nervous system: Alert and oriented. No focal neurological deficits. Extremities:  no edema Skin: No rashes, lesions or ulcers Psychiatry: Judgement and insight appear normal. Mood & affect appropriate.     Data Reviewed: I have personally reviewed  labs and visualized  imaging studies since the last encounter and formulate the plan        Scheduled Meds:  amLODipine  10 mg Oral Daily   vitamin C  250 mg Oral Daily   atenolol  100 mg Oral Daily   cholecalciferol  2,000 Units Oral Daily   diltiazem  120 mg Oral Daily   irbesartan  150 mg Oral Daily   And   hydrochlorothiazide  25 mg Oral Daily   magnesium gluconate  500 mg Oral QPM   polyethylene glycol  17 g Oral Daily   senna-docusate  1 tablet Oral BID   tamoxifen  20 mg Oral Daily   vitamin B-12  500 mcg Oral Daily   Continuous Infusions:  cefTRIAXone (ROCEPHIN)  IV 2 g (10/09/21 1703)   metronidazole 500 mg (10/10/21 0535)      LOS: 4 days      Florencia Reasons, MD PhD FACP [facility]  Available via Epic secure chat 7am-7pm for nonurgent issues Please page for urgent issues To page the attending provider between 7A-7P or the covering provider during after hours 7P-7A, please log into the web site [email or web address] and access using universal [place] password for that  web site. If you do not have the password, please call the hospital operator.    10/10/2021, 9:37 AM

## 2021-10-11 ENCOUNTER — Other Ambulatory Visit (HOSPITAL_COMMUNITY): Payer: Self-pay

## 2021-10-11 DIAGNOSIS — K8050 Calculus of bile duct without cholangitis or cholecystitis without obstruction: Secondary | ICD-10-CM | POA: Diagnosis not present

## 2021-10-11 LAB — CBC WITH DIFFERENTIAL/PLATELET
Abs Immature Granulocytes: 0.07 10*3/uL (ref 0.00–0.07)
Basophils Absolute: 0 10*3/uL (ref 0.0–0.1)
Basophils Relative: 0 %
Eosinophils Absolute: 0.1 10*3/uL (ref 0.0–0.5)
Eosinophils Relative: 1 %
HCT: 27.9 % — ABNORMAL LOW (ref 36.0–46.0)
Hemoglobin: 9.9 g/dL — ABNORMAL LOW (ref 12.0–15.0)
Immature Granulocytes: 1 %
Lymphocytes Relative: 21 %
Lymphs Abs: 2.1 10*3/uL (ref 0.7–4.0)
MCH: 30.9 pg (ref 26.0–34.0)
MCHC: 35.5 g/dL (ref 30.0–36.0)
MCV: 87.2 fL (ref 80.0–100.0)
Monocytes Absolute: 1.2 10*3/uL — ABNORMAL HIGH (ref 0.1–1.0)
Monocytes Relative: 12 %
Neutro Abs: 6.5 10*3/uL (ref 1.7–7.7)
Neutrophils Relative %: 65 %
Platelets: 152 10*3/uL (ref 150–400)
RBC: 3.2 MIL/uL — ABNORMAL LOW (ref 3.87–5.11)
RDW: 14.6 % (ref 11.5–15.5)
WBC: 10 10*3/uL (ref 4.0–10.5)
nRBC: 0 % (ref 0.0–0.2)

## 2021-10-11 LAB — CULTURE, BLOOD (ROUTINE X 2): Special Requests: ADEQUATE

## 2021-10-11 LAB — BASIC METABOLIC PANEL
Anion gap: 6 (ref 5–15)
BUN: 16 mg/dL (ref 8–23)
CO2: 24 mmol/L (ref 22–32)
Calcium: 8.6 mg/dL — ABNORMAL LOW (ref 8.9–10.3)
Chloride: 107 mmol/L (ref 98–111)
Creatinine, Ser: 0.62 mg/dL (ref 0.44–1.00)
GFR, Estimated: >60 mL/min (ref >60)
Glucose, Bld: 127 mg/dL — ABNORMAL HIGH (ref 70–99)
Potassium: 4.7 mmol/L (ref 3.5–5.1)
Sodium: 137 mmol/L (ref 135–145)

## 2021-10-11 LAB — MAGNESIUM: Magnesium: 1.6 mg/dL — ABNORMAL LOW (ref 1.7–2.4)

## 2021-10-11 LAB — HEMOGLOBIN A1C
Hgb A1c MFr Bld: 6.7 % — ABNORMAL HIGH (ref 4.8–5.6)
Mean Plasma Glucose: 145.59 mg/dL

## 2021-10-11 LAB — GLUCOSE, CAPILLARY: Glucose-Capillary: 224 mg/dL — ABNORMAL HIGH (ref 70–99)

## 2021-10-11 MED ORDER — CEFDINIR 300 MG PO CAPS: 300.0000 mg | ORAL_CAPSULE | Freq: Two times a day (BID) | ORAL | 0 refills | Status: DC

## 2021-10-11 MED ORDER — METRONIDAZOLE 500 MG PO TABS: 500.0000 mg | ORAL_TABLET | Freq: Two times a day (BID) | ORAL | 0 refills | Status: DC

## 2021-10-11 MED ORDER — CEFDINIR 300 MG PO CAPS
300.0000 mg | ORAL_CAPSULE | Freq: Two times a day (BID) | ORAL | 0 refills | Status: DC
  Filled 2021-10-11: qty 14, 7d supply, fill #0

## 2021-10-11 MED ORDER — METRONIDAZOLE 500 MG PO TABS
500.0000 mg | ORAL_TABLET | Freq: Two times a day (BID) | ORAL | 0 refills | Status: DC
  Filled 2021-10-11: qty 14, 7d supply, fill #0

## 2021-10-11 MED ORDER — MAGNESIUM SULFATE 2 GM/50ML IV SOLN
2.0000 g | Freq: Once | INTRAVENOUS | Status: AC
Start: 2021-10-11 — End: 2021-10-11
  Administered 2021-10-11: 2 g via INTRAVENOUS
  Filled 2021-10-11: qty 50

## 2021-10-11 MED ORDER — CILOSTAZOL 50 MG PO TABS
50.0000 mg | ORAL_TABLET | Freq: Two times a day (BID) | ORAL | 0 refills | Status: DC
  Filled 2021-10-11: qty 180, 90d supply, fill #0

## 2021-10-11 MED ORDER — CIPROFLOXACIN HCL 500 MG PO TABS: 500.0000 mg | ORAL_TABLET | Freq: Two times a day (BID) | ORAL | 0 refills | Status: AC

## 2021-10-11 NOTE — Progress Notes (Signed)
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 Blood culture sensitivities still pending and this will be needed prior to determining correct antibiotics on discharge.  Assessment & Plan:   Principal Problem:   Choledocholithiasis Active Problems:   Essential hypertension   Hypokalemia   Hypomagnesemia   LFT elevation   Hyponatremia   Calculus of bile duct with cholangitis and obstruction  Assessment and Plan:   Biliary obstruction with dilated biliary track and stones in the common bile duct -History of cholecystectomy and CBD stones required ERCP in the past -with elevated lft, elevated WBC on presentation , no fever -received hydration, empiric abxRocephin /Flagyl , as needed analgesics and antiemetics -s/p ERCP stone extraction on 7/22, plan per GI   Ecoli bacteremia -persistent leukocytosis -continue current abx, f/u on sensitivities, still pending   AKI on CKDII Cr peaked at 1.43, cr normalized.  urine culture no growth  renal dosing meds, avoid nephrotoxin, bp elevated, resume home meds Diovan HCTZ Repeat bmp in am   Mild hypomagnesemia Likely due to dehydration/poor oral intake Replete and reevaluate in a.m.   Hypertension Initially bp low normal, bp start to trend up, gradually resume home bp meds   Hyperglycemia No prior h/o diabetes, check a1c     PAD On pletal, last taken on Monday, hold per gi request, resume in one week post ERCP per  GI   Spinal stenosis, walks with a walker, did well with PT,    Reports h/o overreactive bladder Bladder is full on Ct  scan, bladder scan with 150cc with no urge to urinate, ( could be from narcotic initially) Repeat bladder scan on 7/23 am 80cc in the bladder will hold oxybutynin/Myrbetriq Consider urology follow up after discharge    DVT prophylaxis: Lovenox Code Status: Full Family Communication: Daughter at bedside 7/24 Disposition Plan:  Status is: Inpatient Remains inpatient appropriate because: Need for IV antibiotics.  Consultants:  GI  Procedures:  ERCP 7/22  Antimicrobials:  Anti-infectives (From admission, onward)    Start     Dose/Rate Route Frequency Ordered Stop   10/11/21 0000  cefdinir (OMNICEF) 300 MG capsule  Status:  Discontinued        300 mg Oral 2 times daily 10/11/21 0919 10/11/21    10/11/21 0000  metroNIDAZOLE (FLAGYL) 500 MG tablet  Status:  Discontinued        500 mg Oral 2 times daily 10/11/21 0919 10/11/21    10/11/21 0000  cefdinir (OMNICEF) 300 MG capsule        300 mg Oral 2 times daily 10/11/21 0920 10/18/21 2359   10/11/21 0000  metroNIDAZOLE (FLAGYL) 500 MG tablet        500 mg Oral 2 times daily 10/11/21 0920 10/18/21 2359   10/06/21 1900  cefTRIAXone (ROCEPHIN) 2 g in sodium chloride 0.9 % 100 mL IVPB        2 g 200 mL/hr over 30 Minutes Intravenous Daily-1800 10/06/21 1805     10/06/21 1800  metroNIDAZOLE (FLAGYL) IVPB 500 mg        500 mg 100 mL/hr  over 60 Minutes Intravenous Every 12 hours 10/06/21 1700         Subjective: Patient seen and evaluated today with no new acute complaints or concerns. No acute concerns or events noted overnight.  She is eager to go home.  Objective: Vitals:   10/10/21 0535 10/10/21 1216 10/10/21 2208 10/11/21 0525  BP: (!) 162/77 125/68 (!) 149/70 (!) 158/75  Pulse: 76 (!) 45 66 77  Resp:  '18 18 18  '$ Temp:  98.9 F (37.2 C) 99.5 F (37.5 C) 98.1 F (36.7 C)  TempSrc:  Oral Oral Oral  SpO2:  99% 100% 98%  Weight:      Height:        Intake/Output Summary (Last 24 hours) at 10/11/2021 1049 Last data filed at  10/11/2021 0538 Gross per 24 hour  Intake 360 ml  Output 1600 ml  Net -1240 ml   Filed Weights   10/05/21 2220 10/06/21 1558  Weight: 72.6 kg 71.9 kg    Examination:  General exam: Appears calm and comfortable  Respiratory system: Clear to auscultation. Respiratory effort normal. Cardiovascular system: S1 & S2 heard, RRR.  Gastrointestinal system: Abdomen is soft Central nervous system: Alert and awake Extremities: No edema Skin: No significant lesions noted Psychiatry: Flat affect.    Data Reviewed: I have personally reviewed following labs and imaging studies  CBC: Recent Labs  Lab 10/06/21 1626 10/07/21 0437 10/08/21 0400 10/09/21 0511 10/10/21 0422 10/11/21 0358  WBC 15.5* 23.4* 18.4* 17.3* 19.8* 10.0  NEUTROABS 14.3*  --  16.6* 14.2* 16.6* 6.5  HGB 12.1 10.9* 10.7* 10.0* 11.2* 9.9*  HCT 34.4* 32.6* 30.3* 28.3* 31.2* 27.9*  MCV 91.0 93.7 89.4 88.2 88.1 87.2  PLT 136* 121* 112* 112* 140* 741   Basic Metabolic Panel: Recent Labs  Lab 10/06/21 1626 10/07/21 0437 10/07/21 1516 10/08/21 0400 10/09/21 0511 10/10/21 0422 10/11/21 0358  NA 134* 135 132* 135 133* 134* 137  K 3.1* 5.0 4.8 4.1 4.2 4.3 4.7  CL 101 104 104 103 104 103 107  CO2 22 19* 21* '23 22 23 24  '$ GLUCOSE 134* 156* 164* 119* 130* 260* 127*  BUN 17 24* 25* '19 12 18 16  '$ CREATININE 0.84 1.43* 1.04* 0.79 0.56 0.68 0.62  CALCIUM 9.1 8.9 8.5* 8.4* 8.0* 8.6* 8.6*  MG 1.6* 2.6*  --   --  1.8 1.8 1.6*   GFR: Estimated Creatinine Clearance: 47.1 mL/min (by C-G formula based on SCr of 0.62 mg/dL). Liver Function Tests: Recent Labs  Lab 10/06/21 1626 10/07/21 0437 10/08/21 0400 10/09/21 0511 10/10/21 0422  AST 96* 79* 36 21 20  ALT 85* 74* 48* 34 31  ALKPHOS 222* 190* 181* 155* 171*  BILITOT 5.5* 2.8* 1.4* 1.2 1.0  PROT 7.0 6.9 6.3* 5.9* 7.3  ALBUMIN 2.7* 2.7* 2.4* 2.2* 2.8*   Recent Labs  Lab 10/05/21 2239  LIPASE 46   No results for input(s): "AMMONIA" in the last 168  hours. Coagulation Profile: No results for input(s): "INR", "PROTIME" in the last 168 hours. Cardiac Enzymes: No results for input(s): "CKTOTAL", "CKMB", "CKMBINDEX", "TROPONINI" in the last 168 hours. BNP (last 3 results) No results for input(s): "PROBNP" in the last 8760 hours. HbA1C: Recent Labs    10/11/21 0358  HGBA1C 6.7*   CBG: Recent Labs  Lab 10/09/21 0902  GLUCAP 162*   Lipid Profile: No results for input(s): "CHOL", "HDL", "LDLCALC", "TRIG", "CHOLHDL", "LDLDIRECT" in the last 72 hours. Thyroid Function Tests: No results for input(s): "TSH", "  T4TOTAL", "FREET4", "T3FREE", "THYROIDAB" in the last 72 hours. Anemia Panel: No results for input(s): "VITAMINB12", "FOLATE", "FERRITIN", "TIBC", "IRON", "RETICCTPCT" in the last 72 hours. Sepsis Labs: Recent Labs  Lab 10/06/21 0130  LATICACIDVEN 1.0    Recent Results (from the past 240 hour(s))  Urine Culture     Status: None   Collection Time: 10/07/21 12:31 PM   Specimen: Urine, Clean Catch  Result Value Ref Range Status   Specimen Description   Final    URINE, CLEAN CATCH Performed at [facility], [place] [address], [place], [place] [address]    Special Requests   Final    NONE Performed at [facility], [place] [address] [place], [place] [address]    Culture   Final    NO GROWTH Performed at [facility], [place] [address] [place], [place] [address]    Report Status 10/08/2021 FINAL  Final  Culture, blood (Routine X 2) w Reflex to ID Panel     Status: Abnormal   Collection Time: 10/07/21  3:16 PM   Specimen: BLOOD RIGHT WRIST  Result Value Ref Range Status   Specimen Description   Final    BLOOD RIGHT WRIST Performed at [facility], [place] [address], [place], [place] [address]    Special Requests   Final    IN PEDIATRIC BOTTLE Blood Culture adequate volume Performed at [facility] [address], [place], [place] [address]    Culture   Setup Time   Final    GRAM NEGATIVE RODS IN PEDIATRIC BOTTLE CRITICAL RESULT CALLED TO, READ BACK BY AND VERIFIED WITH: M LILLISTON,PHARMD'@0142'$  10/09/21 [place] Performed at [facility], [place] [address] [place], [place] [address]    Culture ESCHERICHIA COLI (A)  Final   Report Status 10/11/2021 FINAL  Final   Organism ID, Bacteria ESCHERICHIA COLI  Final      Susceptibility   Escherichia coli - MIC*    AMPICILLIN >=32 RESISTANT Resistant     CEFAZOLIN >=64 RESISTANT Resistant     CEFEPIME <=0.12 SENSITIVE Sensitive     CEFTAZIDIME <=1 SENSITIVE Sensitive     CEFTRIAXONE 1 SENSITIVE Sensitive     CIPROFLOXACIN <=0.25 SENSITIVE Sensitive     GENTAMICIN <=1 SENSITIVE Sensitive     IMIPENEM <=0.25 SENSITIVE Sensitive     TRIMETH/SULFA <=20 SENSITIVE Sensitive     AMPICILLIN/SULBACTAM >=32 RESISTANT Resistant     PIP/TAZO 16 SENSITIVE Sensitive     * ESCHERICHIA COLI  Blood Culture ID Panel (Reflexed)     Status: Abnormal   Collection Time: 10/07/21  3:16 PM  Result Value Ref Range Status   Enterococcus faecalis NOT DETECTED NOT DETECTED Final   Enterococcus Faecium NOT DETECTED NOT DETECTED Final   Listeria monocytogenes NOT DETECTED NOT DETECTED Final   Staphylococcus species NOT DETECTED NOT DETECTED Final   Staphylococcus aureus (BCID) NOT DETECTED NOT DETECTED Final   Staphylococcus epidermidis NOT DETECTED NOT DETECTED Final   Staphylococcus lugdunensis NOT DETECTED NOT DETECTED Final   Streptococcus species NOT DETECTED NOT DETECTED Final   Streptococcus agalactiae NOT DETECTED NOT DETECTED Final   Streptococcus pneumoniae NOT DETECTED NOT DETECTED Final   Streptococcus pyogenes NOT DETECTED NOT DETECTED Final   A.calcoaceticus-baumannii NOT DETECTED NOT DETECTED Final   Bacteroides fragilis NOT DETECTED NOT DETECTED Final   Enterobacterales DETECTED (A) NOT DETECTED Final    Comment: Enterobacterales represent a large order of gram negative bacteria, not a single  organism. CRITICAL RESULT CALLED TO, READ BACK BY AND VERIFIED WITH: M LILLISTON,PHARMD'@0141'$  10/09/21 [place]    Enterobacter cloacae complex NOT DETECTED NOT DETECTED Final   Escherichia coli DETECTED (A) NOT DETECTED Final    Comment: CRITICAL RESULT CALLED TO, READ BACK BY AND VERIFIED WITH: M LILLISTON,PHARMD'@0141'$  10/09/21 [place]    Klebsiella aerogenes NOT DETECTED NOT DETECTED Final   Klebsiella oxytoca NOT DETECTED NOT DETECTED Final   Klebsiella pneumoniae NOT DETECTED NOT DETECTED Final   Proteus species NOT DETECTED NOT DETECTED Final   Salmonella species NOT DETECTED NOT DETECTED Final   Serratia marcescens NOT DETECTED NOT DETECTED Final   Haemophilus influenzae NOT DETECTED NOT DETECTED Final   Neisseria meningitidis NOT DETECTED NOT DETECTED Final   Pseudomonas aeruginosa NOT DETECTED NOT DETECTED Final   Stenotrophomonas maltophilia NOT DETECTED NOT DETECTED Final   Candida albicans NOT DETECTED NOT DETECTED Final   Candida auris NOT DETECTED NOT DETECTED Final   Candida glabrata NOT DETECTED NOT DETECTED Final   Candida krusei NOT DETECTED NOT DETECTED Final   Candida parapsilosis NOT DETECTED NOT DETECTED Final   Candida tropicalis NOT DETECTED NOT DETECTED Final   Cryptococcus neoformans/gattii NOT DETECTED NOT DETECTED Final   CTX-M ESBL NOT DETECTED NOT DETECTED Final   Carbapenem resistance IMP NOT DETECTED NOT DETECTED Final   Carbapenem resistance KPC NOT DETECTED NOT DETECTED Final   Carbapenem resistance NDM NOT DETECTED NOT DETECTED Final   Carbapenem resist OXA 48 LIKE NOT DETECTED NOT DETECTED Final   Carbapenem resistance VIM NOT DETECTED NOT DETECTED Final    Comment: Performed at [facility], [address] [place], [place] [address]  Culture, blood (Routine X 2) w Reflex to ID Panel     Status: None (Preliminary result)   Collection Time: 10/07/21  3:18 PM   Specimen: BLOOD  Result Value Ref Range Status   Specimen Description   Final    BLOOD  BLOOD RIGHT HAND Performed at [place] [address] [place], [place] [address]    Special Requests   Final    IN PEDIATRIC BOTTLE Blood Culture adequate volume Performed at [facility] [address] [place], [place] [address]    Culture   Final    NO GROWTH 3 DAYS Performed at [facility], [place] [address], [place], [place] [address]    Report Status PENDING  Incomplete         Radiology Studies: No results found.      Scheduled Meds:  amLODipine  10 mg Oral Daily   vitamin C  250 mg Oral Daily   atenolol  100 mg Oral Daily   cholecalciferol  2,000 Units Oral Daily   diltiazem  120 mg Oral Daily   enoxaparin (LOVENOX) injection  40 mg Subcutaneous Q24H   irbesartan  150 mg Oral Daily   And   hydrochlorothiazide  25 mg Oral Daily   magnesium gluconate  500 mg Oral QPM   polyethylene glycol  17 g Oral Daily   senna-docusate  1 tablet Oral BID   tamoxifen  20 mg Oral Daily   vitamin B-12  500 mcg Oral Daily   Continuous Infusions:  cefTRIAXone (ROCEPHIN)  IV 2 g (10/10/21 1743)   magnesium sulfate bolus IVPB     metronidazole 500 mg (10/11/21 0600)     LOS: 5 days    Time spent: 35 minutes    Marbin Olshefski Darleen Crocker, DO [facility]  If 7PM-7AM, please contact night-coverage [email or web address] 10/11/2021, 10:49 AM

## 2021-10-11 NOTE — Progress Notes (Signed)
Physical Therapy Treatment Patient Details Name: Kristine Burnett MRN: [id number] DOB: 20-Dec-1929 Today's Date: 10/11/2021   History of Present Illness 88 y.o. 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    PT Comments    General Comments: AxO x 3 very sweet Lady 88 yo General bed mobility comments: OOB on BSC on arrival   General transfer comment: good use of hands to steady self.  Good safety cognition. General Gait Details: <25% VC's on safety with turns using walker.  Tolerated distance well. Pt plans to return home with family support.  Recommendations for follow up therapy are one component of a multi-disciplinary discharge planning process, led by the attending physician.  Recommendations may be updated based on patient status, additional functional criteria and insurance authorization.  Follow Up Recommendations  No PT follow up     Assistance Recommended at Discharge Set up Supervision/Assistance  Patient can return home with the following Assistance with cooking/housework;Assist for transportation;Help with stairs or ramp for entrance   Equipment Recommendations  None recommended by PT    Recommendations for Other Services       Precautions / Restrictions Precautions Precautions: Fall Precaution Comments: over active bladder Restrictions Weight Bearing Restrictions: No     Mobility  Bed Mobility               General bed mobility comments: OOB on BSC on arrival    Transfers Overall transfer level: Needs assistance Equipment used: Rolling walker (2 wheels) Transfers: Sit to/from Stand Sit to Stand: Supervision, Min guard           General transfer comment: good use of  hands to steady self.  Good safety cognition.    Ambulation/Gait Ambulation/Gait assistance: Supervision Gait Distance (Feet): 115 Feet Assistive device: Rolling walker (2 wheels) Gait Pattern/deviations: Step-through pattern, Decreased stride length, Trunk flexed Gait velocity: WNL     General Gait Details: <25% VC's on safety with turns using walker.  Tolerated distance well.   Stairs             Wheelchair Mobility    Modified Rankin (Stroke Patients Only)       Balance                                            Cognition Arousal/Alertness: Awake/alert Behavior During Therapy: WFL for tasks assessed/performed Overall Cognitive Status: Within Functional Limits for tasks assessed                                 General Comments: AxO x 3 very sweet Lady 88 yo        Exercises      General Comments        Pertinent Vitals/Pain Pain Assessment Pain Assessment: Faces Faces Pain Scale: Hurts a little bit Pain Location: back Pain Descriptors / Indicators: Discomfort Pain Intervention(s): Monitored during session    Home Living  Prior Function            PT Goals (current goals can now be found in the care plan section) Progress towards PT goals: Progressing toward goals    Frequency    Min 3X/week      PT Plan Current plan remains appropriate    Co-evaluation              AM-PAC PT "6 Clicks" Mobility   Outcome Measure  Help needed turning from your back to your side while in a flat bed without using bedrails?: A Little Help needed moving from lying on your back to sitting on the side of a flat bed without using bedrails?: A Little Help needed moving to and from a bed to a chair (including a wheelchair)?: A Little Help needed standing up from a chair using your arms (e.g., wheelchair or bedside chair)?: A Little Help needed to walk in hospital room?: A Little Help  needed climbing 3-5 steps with a railing? : A Little 6 Click Score: 18    End of Session Equipment Utilized During Treatment: Gait belt Activity Tolerance: Patient tolerated treatment well;No increased pain Patient left: in chair;with chair alarm set;with family/visitor present;with call bell/phone within reach Nurse Communication: Mobility status PT Visit Diagnosis: Difficulty in walking, not elsewhere classified (R26.2);History of falling (Z91.81)     Time: 0375-4360 PT Time Calculation (min) (ACUTE ONLY): 15 min  Charges:  $Gait Training: 8-22 mins                     Rica Koyanagi  PTA [place] [facility] M-F          [phone] Weekend pager [phone]

## 2021-10-11 NOTE — Discharge Summary (Signed)
Physician Discharge Summary  Kristine Burnett BTD:[id number] DOB: 13-Dec-1929 DOA: 10/06/2021  PCP: Willey Blade, MD  Admit date: 10/06/2021  Discharge date: 10/11/2021  Admitted From:Home  Disposition:  Home  Recommendations for Outpatient Follow-up:  Follow up with PCP in 1-2 weeks Follow-up with GI which will be arranged Resume Pletal 7/30 Continue ciprofloxacin for 2 more days to complete total 7-day course of treatment as recommended by ID Continue other home medications as prior  Home Health: None  Equipment/Devices: None  Discharge Condition:Stable  CODE STATUS: Full  Diet recommendation: Heart Healthy  Brief/Interim Summary: Kristine Burnett is a 88 y.o. 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  She was admitted with biliary obstruction with dilated biliary duct and stones noted in the common bile duct.  She underwent ERCP with stone extraction on 7/22 and was noted to have some E. coli bacteremia likely related to this biliary etiology.  She was treated empirically with Rocephin and Flagyl IV and received IV hydration.  She was also noted to have some AKI and CKD stage II along with some associated electrolyte abnormalities.  At this point in time, she is tolerating diet and leukocytosis has down trended.  She is in stable condition to transition to oral antibiotics and discharge home.  No other acute events noted throughout the course of this admission.  She will follow-up with GI in the outpatient setting as scheduled.  Discharge Diagnoses:  Principal Problem:   Choledocholithiasis Active Problems:   Essential hypertension   Hypokalemia   Hypomagnesemia   LFT elevation   Hyponatremia   Calculus of bile  duct with cholangitis and obstruction  Principal discharge diagnosis: Choledocholithiasis, cholangitis with E. coli bacteremia status post ERCP with sphincterotomy and stone extraction 7/22.  Discharge Instructions  Discharge Instructions     Diet - low sodium heart healthy   Complete by: As directed    Increase activity slowly   Complete by: As directed       Allergies as of 10/11/2021       Reactions   Ace Inhibitors Other (See Comments), Cough   Enalapril OK   Metformin And Related Diarrhea   Penicillins Other (See Comments)   Reaction not recalled Has patient had a PCN reaction causing immediate rash, facial/tongue/throat swelling, SOB or lightheadedness with hypotension: No Has patient had a PCN reaction causing severe rash involving mucus membranes or skin necrosis: No Has patient had a PCN reaction that required hospitalization: No Has patient had a PCN reaction occurring within the last 10 years: No If all of the above answers are "NO", then may proceed with Cephalosporin use.   Grapefruit Extract Rash   Minoxidil Other (See Comments)   "Grew hair on face"        Medication List     TAKE these medications    acetaminophen 650 MG CR tablet Commonly known as: TYLENOL Take 650 mg by mouth See admin instructions. Take 650 mg by mouth in the morning & evening and an additional 650 mg during the day as needed for pain   amLODipine 10 MG tablet Commonly known as: NORVASC Take 10 mg by mouth daily.   atenolol 100 MG tablet Commonly known as: TENORMIN TAKE ONE TABLET BY MOUTH  DAILY *PLEASE SCHEDULE F/U APPOINTMENT TO OBTAIN FURTHER REFILLS* What changed: See the new instructions.   cilostazol 50 MG tablet Commonly known as: PLETAL Take 1 tablet (50 mg total) by mouth 2 (two) times daily. Start taking on: October 17, 2021 What changed: These instructions start on October 17, 2021. If you are unsure what to do until then, ask your doctor or other care provider.    ciprofloxacin 500 MG tablet Commonly known as: Cipro Take 1 tablet (500 mg total) by mouth 2 (two) times daily for 2 days.   Coenzyme Q10 200 MG capsule Take 200 mg by mouth daily.   diltiazem 120 MG tablet Commonly known as: CARDIZEM TAKE ONE TABLET BY MOUTH DAILY What changed: when to take this   Flaxseed Oil 1000 MG Caps Take 1,000 mg by mouth 4 (four) times daily.   freestyle lancets Test glucose 1 time daily   Magnesium 500 MG Tabs Take 500 mg by mouth every evening.   MULTIVITAMIN ADULT PO Take 1 tablet by mouth daily at 6 (six) AM.   Myrbetriq 25 MG Tb24 tablet Generic drug: mirabegron ER Take 25 mg by mouth daily.   oxybutynin 5 MG tablet Commonly known as: DITROPAN Take 5 mg by mouth 2 (two) times daily.   tamoxifen 20 MG tablet Commonly known as: NOLVADEX Take 1 tablet (20 mg total) by mouth daily.   valsartan-hydrochlorothiazide 160-25 MG tablet Commonly known as: DIOVAN-HCT TAKE ONE TABLET BY MOUTH DAILY   VITAMIN B 12 PO Take 500 mcg by mouth daily.   vitamin C 250 MG tablet Commonly known as: ASCORBIC ACID Take 250 mg by mouth daily.   Vitamin D-3 25 MCG (1000 UT) Caps Take 2,000 Units by mouth daily.        Follow-up Information     Willey Blade, MD. Schedule an appointment as soon as possible for a visit in 1 month(s).   Specialty: Internal Medicine Contact information: [address] [place] [address] [phone]         Yetta Flock, MD. Go to.   Specialty: Gastroenterology Contact information: [address] [place] [address] [phone]                Allergies  Allergen Reactions   Ace Inhibitors Other (See Comments) and Cough    Enalapril OK   Metformin And Related Diarrhea   Penicillins Other (See Comments)    Reaction not recalled Has patient had a PCN reaction causing immediate rash, facial/tongue/throat swelling, SOB or lightheadedness with hypotension:  No Has patient had a PCN reaction causing severe rash involving mucus membranes or skin necrosis: No Has patient had a PCN reaction that required hospitalization: No Has patient had a PCN reaction occurring within the last 10 years: No If all of the above answers are "NO", then may proceed with Cephalosporin use.    Grapefruit Extract Rash   Minoxidil Other (See Comments)    "Grew hair on face"    Consultations: GI   Procedures/Studies: DG ERCP  Result Date: 10/09/2021 CLINICAL DATA:  Choledocholithiasis. EXAM: ERCP COMPARISON:  CT AP 10/07/2018.  ERCP, 01/01/2009. FLUOROSCOPY: Exposure Index (as provided by the fluoroscopic device): 127.9 mGy Kerma FINDINGS: Multiple, limited oblique planar images of the RIGHT upper quadrant obtained C-arm. Images demonstrating flexible endoscopy, biliary duct cannulation, retrograde cholangiogram and balloon sweeps. Intra and extrahepatic biliary ductal dilation. multiple biliary filling defects are demonstrated. IMPRESSION: Fluoroscopic imaging for ERCP. Biliary ductal dilation with  multiple filling defects are demonstrated. For complete description of intra procedural findings, please see performing service dictation. Electronically Signed   By: Michaelle Birks M.D.   On: 10/09/2021 20:16   DG C-Arm 1-60 Min-No Report  Result Date: 10/09/2021 Fluoroscopy was utilized by the requesting physician.  No radiographic interpretation.   CT ABDOMEN PELVIS W CONTRAST  Result Date: 10/06/2021 CLINICAL DATA:  Concern for bowel obstruction. EXAM: CT ABDOMEN AND PELVIS WITH CONTRAST TECHNIQUE: Multidetector CT imaging of the abdomen and pelvis was performed using the standard protocol following bolus administration of intravenous contrast. RADIATION DOSE REDUCTION: This exam was performed according to the departmental dose-optimization program which includes automated exposure control, adjustment of the mA and/or kV according to patient size and/or use of iterative  reconstruction technique. CONTRAST:  161m OMNIPAQUE IOHEXOL 300 MG/ML  SOLN COMPARISON:  CT abdomen pelvis dated 12/12/2020. FINDINGS: Lower chest: There are bibasilar linear scarring. Mild right lower lobe bronchiectasis. Three vessel coronary vascular calcification. No intra-abdominal free air or free fluid. Hepatobiliary: The liver is unremarkable. Cholecystectomy. There is diffuse biliary ductal dilatation, progressed since the prior CT. The common bile duct measures 2.5 cm in diameter. Multiple stones noted in the central CBD at the head of the pancreas. Recommend further evaluation with ERCP. Pancreas: Unremarkable. No pancreatic ductal dilatation or surrounding inflammatory changes. Spleen: Normal in size without focal abnormality. Adrenals/Urinary Tract: The adrenal glands are unremarkable. Small right renal cysts and additional subcentimeter hypodense lesions which are too small to characterize. There is no hydronephrosis on either side. There is symmetric enhancement and excretion of contrast by both kidneys. The visualized ureters and urinary bladder appear unremarkable. Stomach/Bowel: There is sigmoid diverticulosis without active inflammatory changes. There is no bowel obstruction or active inflammation. Appendectomy. Vascular/Lymphatic: Moderate aortoiliac atherosclerotic disease. The IVC is unremarkable no portal venous gas. There is no adenopathy. Reproductive: The uterus and ovaries are grossly unremarkable no adnexal masses. Other: None Musculoskeletal: Degenerative changes of the spine and osteopenia. No acute osseous pathology. IMPRESSION: 1. Interval progression of biliary ductal dilatation with multiple stones in the central CBD. Recommend further evaluation with ERCP. 2. Sigmoid diverticulosis. No bowel obstruction. 3. Aortic Atherosclerosis (ICD10-I70.0). Electronically Signed   By: AAnner CreteM.D.   On: 10/06/2021 01:43   DG Chest 2 View  Result Date: 10/05/2021 CLINICAL DATA:   Chest pain. EXAM: CHEST - 2 VIEW COMPARISON:  Chest radiograph dated 12/12/2020. FINDINGS: No focal consolidation, pleural effusion, pneumothorax. The cardiac silhouette is within normal limits. Atherosclerotic calcification of the aorta. No acute osseous pathology. IMPRESSION: No active cardiopulmonary disease. Electronically Signed   By: AAnner CreteM.D.   On: 10/05/2021 22:48     Discharge Exam: Vitals:   10/10/21 2208 10/11/21 0525  BP: (!) 149/70 (!) 158/75  Pulse: 66 77  Resp: 18 18  Temp: 99.5 F (37.5 C) 98.1 F (36.7 C)  SpO2: 100% 98%   Vitals:   10/10/21 0535 10/10/21 1216 10/10/21 2208 10/11/21 0525  BP: (!) 162/77 125/68 (!) 149/70 (!) 158/75  Pulse: 76 (!) 45 66 77  Resp:  '18 18 18  '$ Temp:  98.9 F (37.2 C) 99.5 F (37.5 C) 98.1 F (36.7 C)  TempSrc:  Oral Oral Oral  SpO2:  99% 100% 98%  Weight:      Height:        General: Pt is alert, awake, not in acute distress Cardiovascular: RRR, S1/S2 +, no rubs, no gallops Respiratory: CTA bilaterally, no wheezing, no rhonchi Abdominal:  Soft, NT, ND, bowel sounds + Extremities: no edema, no cyanosis    The results of significant diagnostics from this hospitalization (including imaging, microbiology, ancillary and laboratory) are listed below for reference.     Microbiology: Recent Results (from the past 240 hour(s))  Urine Culture     Status: None   Collection Time: 10/07/21 12:31 PM   Specimen: Urine, Clean Catch  Result Value Ref Range Status   Specimen Description   Final    URINE, CLEAN CATCH Performed at [facility], [place] [address] [place], [place] [address]    Special Requests   Final    NONE Performed at [facility], [place] [address] [place], [place] [address]    Culture   Final    NO GROWTH Performed at [facility], [place] [address] [place], [place] [address]    Report Status 10/08/2021 FINAL  Final  Culture, blood (Routine X 2) w Reflex to ID  Panel     Status: Abnormal   Collection Time: 10/07/21  3:16 PM   Specimen: BLOOD RIGHT WRIST  Result Value Ref Range Status   Specimen Description   Final    BLOOD RIGHT WRIST Performed at [facility], [place] [address], [place], [place] [address]    Special Requests   Final    IN PEDIATRIC BOTTLE Blood Culture adequate volume Performed at [place] [address] [place], [place] [address]    Culture  Setup Time   Final    GRAM NEGATIVE RODS IN PEDIATRIC BOTTLE CRITICAL RESULT CALLED TO, READ BACK BY AND VERIFIED WITH: M LILLISTON,PHARMD'@0142'$  10/09/21 [place] Performed at [facility], [address], [place], [place] [address]    Culture ESCHERICHIA COLI (A)  Final   Report Status 10/11/2021 FINAL  Final   Organism ID, Bacteria ESCHERICHIA COLI  Final      Susceptibility   Escherichia coli - MIC*    AMPICILLIN >=32 RESISTANT Resistant     CEFAZOLIN >=64 RESISTANT Resistant     CEFEPIME <=0.12 SENSITIVE Sensitive     CEFTAZIDIME <=1 SENSITIVE Sensitive     CEFTRIAXONE 1 SENSITIVE Sensitive     CIPROFLOXACIN <=0.25 SENSITIVE Sensitive     GENTAMICIN <=1 SENSITIVE Sensitive     IMIPENEM <=0.25 SENSITIVE Sensitive     TRIMETH/SULFA <=20 SENSITIVE Sensitive     AMPICILLIN/SULBACTAM >=32 RESISTANT Resistant     PIP/TAZO 16 SENSITIVE Sensitive     * ESCHERICHIA COLI  Blood Culture ID Panel (Reflexed)     Status: Abnormal   Collection Time: 10/07/21  3:16 PM  Result Value Ref Range Status   Enterococcus faecalis NOT DETECTED NOT DETECTED Final   Enterococcus Faecium NOT DETECTED NOT DETECTED Final   Listeria monocytogenes NOT DETECTED NOT DETECTED Final   Staphylococcus species NOT DETECTED NOT DETECTED Final   Staphylococcus aureus (BCID) NOT DETECTED NOT DETECTED Final   Staphylococcus epidermidis NOT DETECTED NOT DETECTED Final   Staphylococcus lugdunensis NOT DETECTED NOT DETECTED Final   Streptococcus species NOT DETECTED NOT DETECTED Final    Streptococcus agalactiae NOT DETECTED NOT DETECTED Final   Streptococcus pneumoniae NOT DETECTED NOT DETECTED Final   Streptococcus pyogenes NOT DETECTED NOT DETECTED Final   A.calcoaceticus-baumannii NOT DETECTED NOT DETECTED Final   Bacteroides fragilis NOT DETECTED NOT DETECTED Final   Enterobacterales DETECTED (A) NOT DETECTED Final    Comment: Enterobacterales represent a large order of gram negative bacteria, not a single organism.  CRITICAL RESULT CALLED TO, READ BACK BY AND VERIFIED WITH: M LILLISTON,PHARMD'@0141'$  10/09/21 [place]    Enterobacter cloacae complex NOT DETECTED NOT DETECTED Final   Escherichia coli DETECTED (A) NOT DETECTED Final    Comment: CRITICAL RESULT CALLED TO, READ BACK BY AND VERIFIED WITH: M LILLISTON,PHARMD'@0141'$  10/09/21 [place]    Klebsiella aerogenes NOT DETECTED NOT DETECTED Final   Klebsiella oxytoca NOT DETECTED NOT DETECTED Final   Klebsiella pneumoniae NOT DETECTED NOT DETECTED Final   Proteus species NOT DETECTED NOT DETECTED Final   Salmonella species NOT DETECTED NOT DETECTED Final   Serratia marcescens NOT DETECTED NOT DETECTED Final   Haemophilus influenzae NOT DETECTED NOT DETECTED Final   Neisseria meningitidis NOT DETECTED NOT DETECTED Final   Pseudomonas aeruginosa NOT DETECTED NOT DETECTED Final   Stenotrophomonas maltophilia NOT DETECTED NOT DETECTED Final   Candida albicans NOT DETECTED NOT DETECTED Final   Candida auris NOT DETECTED NOT DETECTED Final   Candida glabrata NOT DETECTED NOT DETECTED Final   Candida krusei NOT DETECTED NOT DETECTED Final   Candida parapsilosis NOT DETECTED NOT DETECTED Final   Candida tropicalis NOT DETECTED NOT DETECTED Final   Cryptococcus neoformans/gattii NOT DETECTED NOT DETECTED Final   CTX-M ESBL NOT DETECTED NOT DETECTED Final   Carbapenem resistance IMP NOT DETECTED NOT DETECTED Final   Carbapenem resistance KPC NOT DETECTED NOT DETECTED Final   Carbapenem resistance NDM NOT DETECTED NOT DETECTED Final    Carbapenem resist OXA 48 LIKE NOT DETECTED NOT DETECTED Final   Carbapenem resistance VIM NOT DETECTED NOT DETECTED Final    Comment: Performed at [facility], [address], [place], [place] [address]  Culture, blood (Routine X 2) w Reflex to ID Panel     Status: None (Preliminary result)   Collection Time: 10/07/21  3:18 PM   Specimen: BLOOD  Result Value Ref Range Status   Specimen Description   Final    BLOOD BLOOD RIGHT HAND Performed at [place] [address], [place], [place] [address]    Special Requests   Final    IN PEDIATRIC BOTTLE Blood Culture adequate volume Performed at [place] [address], [place], [place] [address]    Culture   Final    NO GROWTH 3 DAYS Performed at [facility], [place] [address], [place], [place] [address]    Report Status PENDING  Incomplete     Labs: BNP (last 3 results) No results for input(s): "BNP" in the last 8760 hours. Basic Metabolic Panel: Recent Labs  Lab 10/06/21 1626 10/07/21 0437 10/07/21 1516 10/08/21 0400 10/09/21 0511 10/10/21 0422 10/11/21 0358  NA 134* 135 132* 135 133* 134* 137  K 3.1* 5.0 4.8 4.1 4.2 4.3 4.7  CL 101 104 104 103 104 103 107  CO2 22 19* 21* '23 22 23 24  '$ GLUCOSE 134* 156* 164* 119* 130* 260* 127*  BUN 17 24* 25* '19 12 18 16  '$ CREATININE 0.84 1.43* 1.04* 0.79 0.56 0.68 0.62  CALCIUM 9.1 8.9 8.5* 8.4* 8.0* 8.6* 8.6*  MG 1.6* 2.6*  --   --  1.8 1.8 1.6*   Liver Function Tests: Recent Labs  Lab 10/06/21 1626 10/07/21 0437 10/08/21 0400 10/09/21 0511 10/10/21 0422  AST 96* 79* 36 21 20  ALT 85* 74* 48* 34 31  ALKPHOS 222* 190* 181* 155* 171*  BILITOT 5.5* 2.8* 1.4* 1.2 1.0  PROT 7.0 6.9 6.3* 5.9* 7.3  ALBUMIN 2.7* 2.7* 2.4* 2.2*  2.8*   Recent Labs  Lab 10/05/21 2239  LIPASE 46   No results for input(s): "AMMONIA" in the last 168 hours. CBC: Recent Labs  Lab 10/06/21 1626 10/07/21 0437 10/08/21 0400 10/09/21 0511  10/10/21 0422 10/11/21 0358  WBC 15.5* 23.4* 18.4* 17.3* 19.8* 10.0  NEUTROABS 14.3*  --  16.6* 14.2* 16.6* 6.5  HGB 12.1 10.9* 10.7* 10.0* 11.2* 9.9*  HCT 34.4* 32.6* 30.3* 28.3* 31.2* 27.9*  MCV 91.0 93.7 89.4 88.2 88.1 87.2  PLT 136* 121* 112* 112* 140* 152   Cardiac Enzymes: No results for input(s): "CKTOTAL", "CKMB", "CKMBINDEX", "TROPONINI" in the last 168 hours. BNP: Invalid input(s): "POCBNP" CBG: Recent Labs  Lab 10/09/21 0902  GLUCAP 162*   D-Dimer No results for input(s): "DDIMER" in the last 72 hours. Hgb A1c Recent Labs    10/11/21 0358  HGBA1C 6.7*   Lipid Profile No results for input(s): "CHOL", "HDL", "LDLCALC", "TRIG", "CHOLHDL", "LDLDIRECT" in the last 72 hours. Thyroid function studies No results for input(s): "TSH", "T4TOTAL", "T3FREE", "THYROIDAB" in the last 72 hours.  Invalid input(s): "FREET3" Anemia work up No results for input(s): "VITAMINB12", "FOLATE", "FERRITIN", "TIBC", "IRON", "RETICCTPCT" in the last 72 hours. Urinalysis    Component Value Date/Time   COLORURINE YELLOW 10/06/2021 0057   APPEARANCEUR CLEAR 10/06/2021 0057   LABSPEC 1.015 10/06/2021 0057   PHURINE 6.5 10/06/2021 0057   GLUCOSEU NEGATIVE 10/06/2021 0057   HGBUR TRACE (A) 10/06/2021 0057   BILIRUBINUR NEGATIVE 10/06/2021 0057   KETONESUR 40 (A) 10/06/2021 0057   PROTEINUR 100 (A) 10/06/2021 0057   UROBILINOGEN 0.2 11/15/2014 0650   NITRITE NEGATIVE 10/06/2021 0057   LEUKOCYTESUR TRACE (A) 10/06/2021 0057   Sepsis Labs Recent Labs  Lab 10/08/21 0400 10/09/21 0511 10/10/21 0422 10/11/21 0358  WBC 18.4* 17.3* 19.8* 10.0   Microbiology Recent Results (from the past 240 hour(s))  Urine Culture     Status: None   Collection Time: 10/07/21 12:31 PM   Specimen: Urine, Clean Catch  Result Value Ref Range Status   Specimen Description   Final    URINE, CLEAN CATCH Performed at [facility], [place] [address], [place], [place] [address]    Special  Requests   Final    NONE Performed at [facility], [place] [address] [place], [place] [address]    Culture   Final    NO GROWTH Performed at [facility], [place] [address], [place], [place] [address]    Report Status 10/08/2021 FINAL  Final  Culture, blood (Routine X 2) w Reflex to ID Panel     Status: Abnormal   Collection Time: 10/07/21  3:16 PM   Specimen: BLOOD RIGHT WRIST  Result Value Ref Range Status   Specimen Description   Final    BLOOD RIGHT WRIST Performed at [facility], [place] [address], [place], [place] [address]    Special Requests   Final    IN PEDIATRIC BOTTLE Blood Culture adequate volume Performed at [place] [address], [place], [place] [address]    Culture  Setup Time   Final    GRAM NEGATIVE RODS IN PEDIATRIC BOTTLE CRITICAL RESULT CALLED TO, READ BACK BY AND VERIFIED WITH: M LILLISTON,PHARMD'@0142'$  10/09/21 [place] Performed at [facility], [place] [address] [place],  [address]    Culture ESCHERICHIA COLI (A)  Final   Report Status 10/11/2021 FINAL  Final   Organism ID, Bacteria ESCHERICHIA COLI  Final  Susceptibility   Escherichia coli - MIC*    AMPICILLIN >=32 RESISTANT Resistant     CEFAZOLIN >=64 RESISTANT Resistant     CEFEPIME <=0.12 SENSITIVE Sensitive     CEFTAZIDIME <=1 SENSITIVE Sensitive     CEFTRIAXONE 1 SENSITIVE Sensitive     CIPROFLOXACIN <=0.25 SENSITIVE Sensitive     GENTAMICIN <=1 SENSITIVE Sensitive     IMIPENEM <=0.25 SENSITIVE Sensitive     TRIMETH/SULFA <=20 SENSITIVE Sensitive     AMPICILLIN/SULBACTAM >=32 RESISTANT Resistant     PIP/TAZO 16 SENSITIVE Sensitive     * ESCHERICHIA COLI  Blood Culture ID Panel (Reflexed)     Status: Abnormal   Collection Time: 10/07/21  3:16 PM  Result Value Ref Range Status   Enterococcus faecalis NOT DETECTED NOT DETECTED Final   Enterococcus Faecium NOT DETECTED NOT DETECTED Final   Listeria monocytogenes NOT DETECTED NOT  DETECTED Final   Staphylococcus species NOT DETECTED NOT DETECTED Final   Staphylococcus aureus (BCID) NOT DETECTED NOT DETECTED Final   Staphylococcus epidermidis NOT DETECTED NOT DETECTED Final   Staphylococcus lugdunensis NOT DETECTED NOT DETECTED Final   Streptococcus species NOT DETECTED NOT DETECTED Final   Streptococcus agalactiae NOT DETECTED NOT DETECTED Final   Streptococcus pneumoniae NOT DETECTED NOT DETECTED Final   Streptococcus pyogenes NOT DETECTED NOT DETECTED Final   A.calcoaceticus-baumannii NOT DETECTED NOT DETECTED Final   Bacteroides fragilis NOT DETECTED NOT DETECTED Final   Enterobacterales DETECTED (A) NOT DETECTED Final    Comment: Enterobacterales represent a large order of gram negative bacteria, not a single organism. CRITICAL RESULT CALLED TO, READ BACK BY AND VERIFIED WITH: M LILLISTON,PHARMD'@0141'$  10/09/21 [place]    Enterobacter cloacae complex NOT DETECTED NOT DETECTED Final   Escherichia coli DETECTED (A) NOT DETECTED Final    Comment: CRITICAL RESULT CALLED TO, READ BACK BY AND VERIFIED WITH: M LILLISTON,PHARMD'@0141'$  10/09/21 [place]    Klebsiella aerogenes NOT DETECTED NOT DETECTED Final   Klebsiella oxytoca NOT DETECTED NOT DETECTED Final   Klebsiella pneumoniae NOT DETECTED NOT DETECTED Final   Proteus species NOT DETECTED NOT DETECTED Final   Salmonella species NOT DETECTED NOT DETECTED Final   Serratia marcescens NOT DETECTED NOT DETECTED Final   Haemophilus influenzae NOT DETECTED NOT DETECTED Final   Neisseria meningitidis NOT DETECTED NOT DETECTED Final   Pseudomonas aeruginosa NOT DETECTED NOT DETECTED Final   Stenotrophomonas maltophilia NOT DETECTED NOT DETECTED Final   Candida albicans NOT DETECTED NOT DETECTED Final   Candida auris NOT DETECTED NOT DETECTED Final   Candida glabrata NOT DETECTED NOT DETECTED Final   Candida krusei NOT DETECTED NOT DETECTED Final   Candida parapsilosis NOT DETECTED NOT DETECTED Final   Candida tropicalis NOT  DETECTED NOT DETECTED Final   Cryptococcus neoformans/gattii NOT DETECTED NOT DETECTED Final   CTX-M ESBL NOT DETECTED NOT DETECTED Final   Carbapenem resistance IMP NOT DETECTED NOT DETECTED Final   Carbapenem resistance KPC NOT DETECTED NOT DETECTED Final   Carbapenem resistance NDM NOT DETECTED NOT DETECTED Final   Carbapenem resist OXA 48 LIKE NOT DETECTED NOT DETECTED Final   Carbapenem resistance VIM NOT DETECTED NOT DETECTED Final    Comment: Performed at [facility], [address] [place], [place] [address]  Culture, blood (Routine X 2) w Reflex to ID Panel     Status: None (Preliminary result)   Collection Time: 10/07/21  3:18 PM   Specimen: BLOOD  Result Value Ref Range Status   Specimen Description   Final  BLOOD BLOOD RIGHT HAND Performed at [place] [address], [place], [place] [address]    Special Requests   Final    IN PEDIATRIC BOTTLE Blood Culture adequate volume Performed at [place] [address] [place], [place] [address]    Culture   Final    NO GROWTH 3 DAYS Performed at [facility], [place] [address], [place],  [address]    Report Status PENDING  Incomplete     Time coordinating discharge: 35 minutes  SIGNED:   Rodena Goldmann, DO [facility] 10/11/2021, 11:05 AM  If 7PM-7AM, please contact night-coverage [email or web address]

## 2021-10-11 NOTE — TOC Transition Note (Signed)
Transition of Care [facility]) - CM/SW Discharge Note   Patient Details  Name: Kristine Burnett MRN: [id number] Date of Birth: [birth year]  [facility] CM/SW Contact:  Leeroy Cha, RN Phone Number: 10/11/2021, 9:28 AM   Clinical Narrative:    Chart reviewed for dc needs.  None found.  No toc needs present.   Final next level of care: Home/Self Care Barriers to Discharge: No Barriers Identified   Patient Goals and CMS Choice Patient states their goals for this hospitalization and ongoing recovery are:: to get better      Discharge Placement                       Discharge Plan and Services                                     Social Determinants of Health (SDOH) Interventions     Readmission Risk Interventions     No data to display

## 2021-10-11 NOTE — Discharge Summary (Deleted)
Physician Discharge Summary  Kristine Burnett DQQ:[id number] DOB: [birth year] DOA: 10/06/2021  PCP: Willey Blade, MD  Admit date: 10/06/2021  Discharge date: 10/11/2021  Admitted From:Home  Disposition:  Home  Recommendations for Outpatient Follow-up:  Follow up with PCP in 1-2 weeks Follow-up with GI which will be arranged Resume Pletal 7/30 Continue cefdinir and Flagyl as prescribed for 7 more days to complete course of treatment Continue other home medications as prior  Home Health: None  Equipment/Devices: None  Discharge Condition:Stable  CODE STATUS: Full  Diet recommendation: Heart Healthy  Brief/Interim Summary: Kristine Burnett is a 88 y.o. 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  She was admitted with biliary obstruction with dilated biliary duct and stones noted in the common bile duct.  She underwent ERCP with stone extraction on 7/22 and was noted to have some E. coli bacteremia likely related to this biliary etiology.  She was treated empirically with Rocephin and Flagyl IV and received IV hydration.  She was also noted to have some AKI and CKD stage II along with some associated electrolyte abnormalities.  At this point in time, she is tolerating diet and leukocytosis has down trended.  She is in stable condition to transition to oral antibiotics and discharge home.  No other acute events noted throughout the course of this admission.  She will follow-up with GI in the outpatient setting as scheduled.  Discharge Diagnoses:  Principal Problem:   Choledocholithiasis Active Problems:   Essential hypertension   Hypokalemia   Hypomagnesemia   LFT elevation   Hyponatremia   Calculus of bile duct with  cholangitis and obstruction  Principal discharge diagnosis: Choledocholithiasis, cholangitis with E. coli bacteremia status post ERCP with sphincterotomy and stone extraction 7/22.  Discharge Instructions  Discharge Instructions     Diet - low sodium heart healthy   Complete by: As directed    Increase activity slowly   Complete by: As directed       Allergies as of 10/11/2021       Reactions   Ace Inhibitors Other (See Comments), Cough   Enalapril OK   Metformin And Related Diarrhea   Penicillins Other (See Comments)   Reaction not recalled Has patient had a PCN reaction causing immediate rash, facial/tongue/throat swelling, SOB or lightheadedness with hypotension: No Has patient had a PCN reaction causing severe rash involving mucus membranes or skin necrosis: No Has patient had a PCN reaction that required hospitalization: No Has patient had a PCN reaction occurring within the last 10 years: No If all of the above answers are "NO", then may proceed with Cephalosporin use.   Grapefruit Extract Rash   Minoxidil Other (See Comments)   "Grew hair on face"        Medication List     TAKE these medications    acetaminophen 650 MG CR tablet Commonly known as: TYLENOL Take 650 mg by mouth See admin instructions. Take 650 mg by mouth in the morning & evening and an additional 650 mg during the day as needed for pain   amLODipine 10 MG tablet Commonly known as: NORVASC Take 10 mg by mouth daily.   atenolol 100 MG tablet Commonly known as: TENORMIN TAKE ONE TABLET BY MOUTH DAILY *PLEASE  SCHEDULE F/U APPOINTMENT TO OBTAIN FURTHER REFILLS* What changed: See the new instructions.   cefdinir 300 MG capsule Commonly known as: OMNICEF Take 1 capsule (300 mg total) by mouth 2 (two) times daily for 7 days.   cilostazol 50 MG tablet Commonly known as: PLETAL Take 1 tablet (50 mg total) by mouth 2 (two) times daily. Start taking on: October 17, 2021 What changed: These  instructions start on October 17, 2021. If you are unsure what to do until then, ask your doctor or other care provider.   Coenzyme Q10 200 MG capsule Take 200 mg by mouth daily.   diltiazem 120 MG tablet Commonly known as: CARDIZEM TAKE ONE TABLET BY MOUTH DAILY What changed: when to take this   Flaxseed Oil 1000 MG Caps Take 1,000 mg by mouth 4 (four) times daily.   freestyle lancets Test glucose 1 time daily   Magnesium 500 MG Tabs Take 500 mg by mouth every evening.   metroNIDAZOLE 500 MG tablet Commonly known as: Flagyl Take 1 tablet (500 mg total) by mouth 2 (two) times daily for 7 days.   MULTIVITAMIN ADULT PO Take 1 tablet by mouth daily at 6 (six) AM.   Myrbetriq 25 MG Tb24 tablet Generic drug: mirabegron ER Take 25 mg by mouth daily.   oxybutynin 5 MG tablet Commonly known as: DITROPAN Take 5 mg by mouth 2 (two) times daily.   tamoxifen 20 MG tablet Commonly known as: NOLVADEX Take 1 tablet (20 mg total) by mouth daily.   valsartan-hydrochlorothiazide 160-25 MG tablet Commonly known as: DIOVAN-HCT TAKE ONE TABLET BY MOUTH DAILY   VITAMIN B 12 PO Take 500 mcg by mouth daily.   vitamin C 250 MG tablet Commonly known as: ASCORBIC ACID Take 250 mg by mouth daily.   Vitamin D-3 25 MCG (1000 UT) Caps Take 2,000 Units by mouth daily.        Follow-up Information     Willey Blade, MD. Schedule an appointment as soon as possible for a visit in 1 month(s).   Specialty: Internal Medicine Contact information: [address] [place] [address] [phone]         Yetta Flock, MD. Go to.   Specialty: Gastroenterology Contact information: [address] [place] [address] [phone]                Allergies  Allergen Reactions   Ace Inhibitors Other (See Comments) and Cough    Enalapril OK   Metformin And Related Diarrhea   Penicillins Other (See Comments)    Reaction not recalled Has patient  had a PCN reaction causing immediate rash, facial/tongue/throat swelling, SOB or lightheadedness with hypotension: No Has patient had a PCN reaction causing severe rash involving mucus membranes or skin necrosis: No Has patient had a PCN reaction that required hospitalization: No Has patient had a PCN reaction occurring within the last 10 years: No If all of the above answers are "NO", then may proceed with Cephalosporin use.    Grapefruit Extract Rash   Minoxidil Other (See Comments)    "Grew hair on face"    Consultations: GI   Procedures/Studies: DG ERCP  Result Date: 10/09/2021 CLINICAL DATA:  Choledocholithiasis. EXAM: ERCP COMPARISON:  CT AP 10/07/2018.  ERCP, 01/01/2009. FLUOROSCOPY: Exposure Index (as provided by the fluoroscopic device): 127.9 mGy Kerma FINDINGS: Multiple, limited oblique planar images of the RIGHT upper quadrant obtained C-arm. Images demonstrating flexible endoscopy, biliary duct cannulation, retrograde cholangiogram and  balloon sweeps. Intra and extrahepatic biliary ductal dilation. multiple biliary filling defects are demonstrated. IMPRESSION: Fluoroscopic imaging for ERCP. Biliary ductal dilation with multiple filling defects are demonstrated. For complete description of intra procedural findings, please see performing service dictation. Electronically Signed   By: Michaelle Birks M.D.   On: 10/09/2021 20:16   DG C-Arm 1-60 Min-No Report  Result Date: 10/09/2021 Fluoroscopy was utilized by the requesting physician.  No radiographic interpretation.   CT ABDOMEN PELVIS W CONTRAST  Result Date: 10/06/2021 CLINICAL DATA:  Concern for bowel obstruction. EXAM: CT ABDOMEN AND PELVIS WITH CONTRAST TECHNIQUE: Multidetector CT imaging of the abdomen and pelvis was performed using the standard protocol following bolus administration of intravenous contrast. RADIATION DOSE REDUCTION: This exam was performed according to the departmental dose-optimization program which  includes automated exposure control, adjustment of the mA and/or kV according to patient size and/or use of iterative reconstruction technique. CONTRAST:  148m OMNIPAQUE IOHEXOL 300 MG/ML  SOLN COMPARISON:  CT abdomen pelvis dated 12/12/2020. FINDINGS: Lower chest: There are bibasilar linear scarring. Mild right lower lobe bronchiectasis. Three vessel coronary vascular calcification. No intra-abdominal free air or free fluid. Hepatobiliary: The liver is unremarkable. Cholecystectomy. There is diffuse biliary ductal dilatation, progressed since the prior CT. The common bile duct measures 2.5 cm in diameter. Multiple stones noted in the central CBD at the head of the pancreas. Recommend further evaluation with ERCP. Pancreas: Unremarkable. No pancreatic ductal dilatation or surrounding inflammatory changes. Spleen: Normal in size without focal abnormality. Adrenals/Urinary Tract: The adrenal glands are unremarkable. Small right renal cysts and additional subcentimeter hypodense lesions which are too small to characterize. There is no hydronephrosis on either side. There is symmetric enhancement and excretion of contrast by both kidneys. The visualized ureters and urinary bladder appear unremarkable. Stomach/Bowel: There is sigmoid diverticulosis without active inflammatory changes. There is no bowel obstruction or active inflammation. Appendectomy. Vascular/Lymphatic: Moderate aortoiliac atherosclerotic disease. The IVC is unremarkable no portal venous gas. There is no adenopathy. Reproductive: The uterus and ovaries are grossly unremarkable no adnexal masses. Other: None Musculoskeletal: Degenerative changes of the spine and osteopenia. No acute osseous pathology. IMPRESSION: 1. Interval progression of biliary ductal dilatation with multiple stones in the central CBD. Recommend further evaluation with ERCP. 2. Sigmoid diverticulosis. No bowel obstruction. 3. Aortic Atherosclerosis (ICD10-I70.0). Electronically  Signed   By: AAnner CreteM.D.   On: 10/06/2021 01:43   DG Chest 2 View  Result Date: 10/05/2021 CLINICAL DATA:  Chest pain. EXAM: CHEST - 2 VIEW COMPARISON:  Chest radiograph dated 12/12/2020. FINDINGS: No focal consolidation, pleural effusion, pneumothorax. The cardiac silhouette is within normal limits. Atherosclerotic calcification of the aorta. No acute osseous pathology. IMPRESSION: No active cardiopulmonary disease. Electronically Signed   By: AAnner CreteM.D.   On: 10/05/2021 22:48     Discharge Exam: Vitals:   10/10/21 2208 10/11/21 0525  BP: (!) 149/70 (!) 158/75  Pulse: 66 77  Resp: 18 18  Temp: 99.5 F (37.5 C) 98.1 F (36.7 C)  SpO2: 100% 98%   Vitals:   10/10/21 0535 10/10/21 1216 10/10/21 2208 10/11/21 0525  BP: (!) 162/77 125/68 (!) 149/70 (!) 158/75  Pulse: 76 (!) 45 66 77  Resp:  '18 18 18  '$ Temp:  98.9 F (37.2 C) 99.5 F (37.5 C) 98.1 F (36.7 C)  TempSrc:  Oral Oral Oral  SpO2:  99% 100% 98%  Weight:      Height:        General: Pt  is alert, awake, not in acute distress Cardiovascular: RRR, S1/S2 +, no rubs, no gallops Respiratory: CTA bilaterally, no wheezing, no rhonchi Abdominal: Soft, NT, ND, bowel sounds + Extremities: no edema, no cyanosis    The results of significant diagnostics from this hospitalization (including imaging, microbiology, ancillary and laboratory) are listed below for reference.     Microbiology: Recent Results (from the past 240 hour(s))  Urine Culture     Status: None   Collection Time: 10/07/21 12:31 PM   Specimen: Urine, Clean Catch  Result Value Ref Range Status   Specimen Description   Final    URINE, CLEAN CATCH Performed at [facility], [place] [address] [place], [place] [address]    Special Requests   Final    NONE Performed at [facility], [place] [address], [place], [place] [address]    Culture   Final    NO GROWTH Performed at [facility], [place]  [address] [place], [place] [address]    Report Status 10/08/2021 FINAL  Final  Culture, blood (Routine X 2) w Reflex to ID Panel     Status: Abnormal (Preliminary result)   Collection Time: 10/07/21  3:16 PM   Specimen: BLOOD  Result Value Ref Range Status   Specimen Description   Final    BLOOD BLOOD RIGHT WRIST Performed at [place] [address] [place], [place] [address]    Special Requests   Final    IN PEDIATRIC BOTTLE Blood Culture adequate volume Performed at [place] [address], [place], [place] [address]    Culture  Setup Time   Final    GRAM NEGATIVE RODS IN PEDIATRIC BOTTLE CRITICAL RESULT CALLED TO, READ BACK BY AND VERIFIED WITH: M LILLISTON,PHARMD'@0142'$  10/09/21 [place]    Culture (A)  Final    ESCHERICHIA COLI SUSCEPTIBILITIES TO FOLLOW Performed at [facility], [address], [place], [place] [address]    Report Status PENDING  Incomplete  Blood Culture ID Panel (Reflexed)     Status: Abnormal   Collection Time: 10/07/21  3:16 PM  Result Value Ref Range Status   Enterococcus faecalis NOT DETECTED NOT DETECTED Final   Enterococcus Faecium NOT DETECTED NOT DETECTED Final   Listeria monocytogenes NOT DETECTED NOT DETECTED Final   Staphylococcus species NOT DETECTED NOT DETECTED Final   Staphylococcus aureus (BCID) NOT DETECTED NOT DETECTED Final   Staphylococcus epidermidis NOT DETECTED NOT DETECTED Final   Staphylococcus lugdunensis NOT DETECTED NOT DETECTED Final   Streptococcus species NOT DETECTED NOT DETECTED Final   Streptococcus agalactiae NOT DETECTED NOT DETECTED Final   Streptococcus pneumoniae NOT DETECTED NOT DETECTED Final   Streptococcus pyogenes NOT DETECTED NOT DETECTED Final   A.calcoaceticus-baumannii NOT DETECTED NOT DETECTED Final   Bacteroides fragilis NOT DETECTED NOT DETECTED Final   Enterobacterales DETECTED (A) NOT DETECTED Final    Comment: Enterobacterales represent a large order of  gram negative bacteria, not a single organism. CRITICAL RESULT CALLED TO, READ BACK BY AND VERIFIED WITH: M LILLISTON,PHARMD'@0141'$  10/09/21 [place]    Enterobacter cloacae complex NOT DETECTED NOT DETECTED Final   Escherichia coli DETECTED (A) NOT DETECTED Final    Comment: CRITICAL RESULT CALLED TO, READ BACK BY AND VERIFIED WITH: M LILLISTON,PHARMD'@0141'$  10/09/21 [place]    Klebsiella aerogenes NOT DETECTED NOT DETECTED Final   Klebsiella oxytoca NOT DETECTED NOT DETECTED Final   Klebsiella pneumoniae NOT DETECTED NOT DETECTED Final   Proteus species NOT  DETECTED NOT DETECTED Final   Salmonella species NOT DETECTED NOT DETECTED Final   Serratia marcescens NOT DETECTED NOT DETECTED Final   Haemophilus influenzae NOT DETECTED NOT DETECTED Final   Neisseria meningitidis NOT DETECTED NOT DETECTED Final   Pseudomonas aeruginosa NOT DETECTED NOT DETECTED Final   Stenotrophomonas maltophilia NOT DETECTED NOT DETECTED Final   Candida albicans NOT DETECTED NOT DETECTED Final   Candida auris NOT DETECTED NOT DETECTED Final   Candida glabrata NOT DETECTED NOT DETECTED Final   Candida krusei NOT DETECTED NOT DETECTED Final   Candida parapsilosis NOT DETECTED NOT DETECTED Final   Candida tropicalis NOT DETECTED NOT DETECTED Final   Cryptococcus neoformans/gattii NOT DETECTED NOT DETECTED Final   CTX-M ESBL NOT DETECTED NOT DETECTED Final   Carbapenem resistance IMP NOT DETECTED NOT DETECTED Final   Carbapenem resistance KPC NOT DETECTED NOT DETECTED Final   Carbapenem resistance NDM NOT DETECTED NOT DETECTED Final   Carbapenem resist OXA 48 LIKE NOT DETECTED NOT DETECTED Final   Carbapenem resistance VIM NOT DETECTED NOT DETECTED Final    Comment: Performed at [facility], [address] [place], [place] [address]  Culture, blood (Routine X 2) w Reflex to ID Panel     Status: None (Preliminary result)   Collection Time: 10/07/21  3:18 PM   Specimen: BLOOD  Result Value Ref Range Status    Specimen Description   Final    BLOOD BLOOD RIGHT HAND Performed at [place] [address], [place], [place] [address]    Special Requests   Final    IN PEDIATRIC BOTTLE Blood Culture adequate volume Performed at [place] [address], [place], [place] [address]    Culture   Final    NO GROWTH 3 DAYS Performed at [facility], [place] [address], [place], [place] [address]    Report Status PENDING  Incomplete     Labs: BNP (last 3 results) No results for input(s): "BNP" in the last 8760 hours. Basic Metabolic Panel: Recent Labs  Lab 10/06/21 1626 10/07/21 0437 10/07/21 1516 10/08/21 0400 10/09/21 0511 10/10/21 0422 10/11/21 0358  NA 134* 135 132* 135 133* 134* 137  K 3.1* 5.0 4.8 4.1 4.2 4.3 4.7  CL 101 104 104 103 104 103 107  CO2 22 19* 21* '23 22 23 24  '$ GLUCOSE 134* 156* 164* 119* 130* 260* 127*  BUN 17 24* 25* '19 12 18 16  '$ CREATININE 0.84 1.43* 1.04* 0.79 0.56 0.68 0.62  CALCIUM 9.1 8.9 8.5* 8.4* 8.0* 8.6* 8.6*  MG 1.6* 2.6*  --   --  1.8 1.8 1.6*   Liver Function Tests: Recent Labs  Lab 10/06/21 1626 10/07/21 0437 10/08/21 0400 10/09/21 0511 10/10/21 0422  AST 96* 79* 36 21 20  ALT 85* 74* 48* 34 31  ALKPHOS 222* 190* 181* 155* 171*  BILITOT 5.5* 2.8* 1.4* 1.2 1.0  PROT 7.0 6.9 6.3* 5.9* 7.3  ALBUMIN 2.7* 2.7* 2.4* 2.2* 2.8*   Recent Labs  Lab 10/05/21 2239  LIPASE 46   No results for input(s): "AMMONIA" in the last 168 hours. CBC: Recent Labs  Lab 10/06/21 1626 10/07/21 0437 10/08/21 0400 10/09/21 0511 10/10/21 0422 10/11/21 0358  WBC 15.5* 23.4* 18.4* 17.3* 19.8* 10.0  NEUTROABS 14.3*  --  16.6* 14.2* 16.6* 6.5  HGB 12.1 10.9* 10.7* 10.0* 11.2* 9.9*  HCT 34.4* 32.6* 30.3* 28.3* 31.2* 27.9*  MCV 91.0 93.7 89.4 88.2 88.1 87.2  PLT  136* 121* 112* 112* 140* 152   Cardiac Enzymes: No results for input(s): "CKTOTAL", "CKMB", "CKMBINDEX", "TROPONINI" in the last 168 hours. BNP: Invalid  input(s): "POCBNP" CBG: Recent Labs  Lab 10/09/21 0902  GLUCAP 162*   D-Dimer No results for input(s): "DDIMER" in the last 72 hours. Hgb A1c Recent Labs    10/11/21 0358  HGBA1C 6.7*   Lipid Profile No results for input(s): "CHOL", "HDL", "LDLCALC", "TRIG", "CHOLHDL", "LDLDIRECT" in the last 72 hours. Thyroid function studies No results for input(s): "TSH", "T4TOTAL", "T3FREE", "THYROIDAB" in the last 72 hours.  Invalid input(s): "FREET3" Anemia work up No results for input(s): "VITAMINB12", "FOLATE", "FERRITIN", "TIBC", "IRON", "RETICCTPCT" in the last 72 hours. Urinalysis    Component Value Date/Time   COLORURINE YELLOW 10/06/2021 0057   APPEARANCEUR CLEAR 10/06/2021 0057   LABSPEC 1.015 10/06/2021 0057   PHURINE 6.5 10/06/2021 0057   GLUCOSEU NEGATIVE 10/06/2021 0057   HGBUR TRACE (A) 10/06/2021 0057   BILIRUBINUR NEGATIVE 10/06/2021 0057   KETONESUR 40 (A) 10/06/2021 0057   PROTEINUR 100 (A) 10/06/2021 0057   UROBILINOGEN 0.2 11/15/2014 0650   NITRITE NEGATIVE 10/06/2021 0057   LEUKOCYTESUR TRACE (A) 10/06/2021 0057   Sepsis Labs Recent Labs  Lab 10/08/21 0400 10/09/21 0511 10/10/21 0422 10/11/21 0358  WBC 18.4* 17.3* 19.8* 10.0   Microbiology Recent Results (from the past 240 hour(s))  Urine Culture     Status: None   Collection Time: 10/07/21 12:31 PM   Specimen: Urine, Clean Catch  Result Value Ref Range Status   Specimen Description   Final    URINE, CLEAN CATCH Performed at [facility], [place] [address] [place], [place] [address]    Special Requests   Final    NONE Performed at [facility], [place] [address], [place], [place] [address]    Culture   Final    NO GROWTH Performed at [facility], [place] [address] [place], [place] [address]    Report Status 10/08/2021 FINAL  Final  Culture, blood (Routine X 2) w Reflex to ID Panel     Status: Abnormal (Preliminary result)   Collection Time: 10/07/21   3:16 PM   Specimen: BLOOD  Result Value Ref Range Status   Specimen Description   Final    BLOOD BLOOD RIGHT WRIST Performed at [place] [address] [place], [place] [address]    Special Requests   Final    IN PEDIATRIC BOTTLE Blood Culture adequate volume Performed at [facility] [place] [address] [place], [place] [address]    Culture  Setup Time   Final    GRAM NEGATIVE RODS IN PEDIATRIC BOTTLE CRITICAL RESULT CALLED TO, READ BACK BY AND VERIFIED WITH: M LILLISTON,PHARMD'@0142'$  10/09/21 [place]    Culture (A)  Final    ESCHERICHIA COLI SUSCEPTIBILITIES TO FOLLOW Performed at [facility], [address], [place], [place] [address]    Report Status PENDING  Incomplete  Blood Culture ID Panel (Reflexed)     Status: Abnormal   Collection Time: 10/07/21  3:16 PM  Result Value Ref Range Status   Enterococcus faecalis NOT DETECTED NOT DETECTED Final   Enterococcus Faecium NOT DETECTED NOT DETECTED Final   Listeria monocytogenes NOT DETECTED NOT DETECTED Final   Staphylococcus species NOT DETECTED NOT DETECTED Final   Staphylococcus aureus (BCID) NOT DETECTED NOT DETECTED Final   Staphylococcus epidermidis NOT DETECTED NOT DETECTED Final   Staphylococcus lugdunensis NOT DETECTED  NOT DETECTED Final   Streptococcus species NOT DETECTED NOT DETECTED Final   Streptococcus agalactiae NOT DETECTED NOT DETECTED Final   Streptococcus pneumoniae NOT DETECTED NOT DETECTED Final   Streptococcus pyogenes NOT DETECTED NOT DETECTED Final   A.calcoaceticus-baumannii NOT DETECTED NOT DETECTED Final   Bacteroides fragilis NOT DETECTED NOT DETECTED Final   Enterobacterales DETECTED (A) NOT DETECTED Final    Comment: Enterobacterales represent a large order of gram negative bacteria, not a single organism. CRITICAL RESULT CALLED TO, READ BACK BY AND VERIFIED WITH: M LILLISTON,PHARMD'@0141'$  10/09/21 [place]    Enterobacter cloacae complex NOT DETECTED NOT  DETECTED Final   Escherichia coli DETECTED (A) NOT DETECTED Final    Comment: CRITICAL RESULT CALLED TO, READ BACK BY AND VERIFIED WITH: M LILLISTON,PHARMD'@0141'$  10/09/21 [place]    Klebsiella aerogenes NOT DETECTED NOT DETECTED Final   Klebsiella oxytoca NOT DETECTED NOT DETECTED Final   Klebsiella pneumoniae NOT DETECTED NOT DETECTED Final   Proteus species NOT DETECTED NOT DETECTED Final   Salmonella species NOT DETECTED NOT DETECTED Final   Serratia marcescens NOT DETECTED NOT DETECTED Final   Haemophilus influenzae NOT DETECTED NOT DETECTED Final   Neisseria meningitidis NOT DETECTED NOT DETECTED Final   Pseudomonas aeruginosa NOT DETECTED NOT DETECTED Final   Stenotrophomonas maltophilia NOT DETECTED NOT DETECTED Final   Candida albicans NOT DETECTED NOT DETECTED Final   Candida auris NOT DETECTED NOT DETECTED Final   Candida glabrata NOT DETECTED NOT DETECTED Final   Candida krusei NOT DETECTED NOT DETECTED Final   Candida parapsilosis NOT DETECTED NOT DETECTED Final   Candida tropicalis NOT DETECTED NOT DETECTED Final   Cryptococcus neoformans/gattii NOT DETECTED NOT DETECTED Final   CTX-M ESBL NOT DETECTED NOT DETECTED Final   Carbapenem resistance IMP NOT DETECTED NOT DETECTED Final   Carbapenem resistance KPC NOT DETECTED NOT DETECTED Final   Carbapenem resistance NDM NOT DETECTED NOT DETECTED Final   Carbapenem resist OXA 48 LIKE NOT DETECTED NOT DETECTED Final   Carbapenem resistance VIM NOT DETECTED NOT DETECTED Final    Comment: Performed at [facility], [address], [place], [place] [address]  Culture, blood (Routine X 2) w Reflex to ID Panel     Status: None (Preliminary result)   Collection Time: 10/07/21  3:18 PM   Specimen: BLOOD  Result Value Ref Range Status   Specimen Description   Final    BLOOD BLOOD RIGHT HAND Performed at [facility] [address] [place], [place] [address]    Special Requests   Final    IN PEDIATRIC BOTTLE  Blood Culture adequate volume Performed at [place] [address] [place], [place] [address]    Culture   Final    NO GROWTH 3 DAYS Performed at [facility], [place] [address], [place],  [address]    Report Status PENDING  Incomplete     Time coordinating discharge: 35 minutes  SIGNED:   Rodena Goldmann, DO [facility] 10/11/2021, 9:20 AM  If 7PM-7AM, please contact night-coverage [email or web address]

## 2021-10-12 ENCOUNTER — Encounter (HOSPITAL_COMMUNITY): Payer: Self-pay | Admitting: Gastroenterology

## 2021-10-12 LAB — CULTURE, BLOOD (ROUTINE X 2)
Culture: NO GROWTH
Special Requests: ADEQUATE

## 2021-11-05 ENCOUNTER — Other Ambulatory Visit: Payer: Self-pay | Admitting: Hematology

## 2021-11-22 NOTE — Progress Notes (Signed)
[place]   [phone] Fax:([phone]) [phone]   Clinic Follow up Note   Patient Care Team: Willey Blade, MD as PCP - General (Internal Medicine) Sharyne Peach, MD as Consulting Physician (Ophthalmology) Rockwell Germany, RN as Oncology Nurse Navigator Mauro Kaufmann, RN as Oncology Nurse Navigator Jovita Kussmaul, MD as Consulting Physician (General Surgery) Truitt Merle, MD as Consulting Physician (Hematology) Armbruster, Carlota Raspberry, MD as Consulting Physician (Gastroenterology) 11/23/2021  CHIEF COMPLAINT: Follow up left breast cancer   SUMMARY OF ONCOLOGIC HISTORY: Oncology History Overview Note  Cancer Staging Ductal carcinoma in situ (DCIS) of left breast Staging form: Breast, AJCC 8th Edition - Clinical stage from 05/13/2020: Stage 0 (cTis (DCIS), cN0, cM0, G3, ER+, PR+, HER2: Not Assessed) - Signed by Truitt Merle, MD on 05/19/2020 Stage prefix: Initial diagnosis    Malignant neoplasm of upper-inner quadrant of left breast in female, estrogen receptor positive ([place])  05/13/2020 Cancer Staging   Staging form: Breast, AJCC 8th Edition - Clinical stage from 05/13/2020: Stage 0 (cTis (DCIS), cN0, cM0, G3, ER+, PR+, HER2: Not Assessed) - Signed by Truitt Merle, MD on 05/19/2020 Stage prefix: Initial diagnosis   05/13/2020 Mammogram    IMPRESSION:  1.1 x 1.0 x 0.9 cm mass in the 9:30 o'clock position of the left breast 5 cm from the nipple with imaging features suspicious for malignancy.    05/13/2020 Initial Biopsy   Diagnosis Breast, left, needle core biopsy, 9:30 O'CLOCK left breast AMENDED DIAGNOSIS: - HIGH GRADE DUCTAL CARCINOMA IN SITU WITH CENTRAL NECROSIS AND CALCIFICATIONS. - FOCUS SUSPICIOUS FOR MICROINVASION. Microscopic Comment E-cadherin is positive supporting a ductal phenotype. In performing E-cadherin, subsequent leveling revealed a focus suspicious for microinvasion. Ancillary studies will be reported separately. Revised results reported  to [facility] on 05/15/2020. Dr. Tresa Moore reviewed.   05/13/2020 Receptors her2   Results: IMMUNOHISTOCHEMICAL AND MORPHOMETRIC ANALYSIS PERFORMED MANUALLY Estrogen Receptor: 95%, POSITIVE, STRONG STAINING INTENSITY Progesterone Receptor: 95%, POSITIVE, STRONG STAINING INTENSITY   05/18/2020 Initial Diagnosis   Ductal carcinoma in situ (DCIS) of left breast   06/10/2020 Genetic Testing   Negative genetic testing:  No pathogenic variants detected on the [employer] CancerNext-Expanded + RNAinsight panel. Two variants of uncertain significance (VUS) were detected - one in the BRCA1 gene called c.4735C>G (p.P1579A) and a second in the BRCA2 gene called c.1741T>C (p.S581P). The report date is 06/10/2020.  The CancerNext-Expanded + RNAinsight gene panel offered by [employer] and includes sequencing and rearrangement analysis for the following 77 genes: AIP, ALK, APC, ATM, AXIN2, BAP1, BARD1, BLM, BMPR1A, BRCA1, BRCA2, BRIP1, CDC73, CDH1, CDK4, CDKN1B, CDKN2A, CHEK2, CTNNA1, DICER1, FANCC, FH, FLCN, GALNT12, KIF1B, LZTR1, MAX, MEN1, MET, MLH1, MSH2, MSH3, MSH6, MUTYH, NBN, NF1, NF2, NTHL1, PALB2, PHOX2B, PMS2, POT1, PRKAR1A, PTCH1, PTEN, RAD51C, RAD51D, RB1, RECQL, RET, SDHA, SDHAF2, SDHB, SDHC, SDHD, SMAD4, SMARCA4, SMARCB1, SMARCE1, STK11, SUFU, TMEM127, TP53, TSC1, TSC2, VHL and XRCC2 (sequencing and deletion/duplication); EGFR, EGLN1, HOXB13, KIT, MITF, PDGFRA, POLD1 and POLE (sequencing only); EPCAM and GREM1 (deletion/duplication only). RNA data is routinely analyzed for use in variant interpretation for all genes.    06/17/2020 Cancer Staging   Staging form: Breast, AJCC 8th Edition - Pathologic stage from 06/17/2020: Stage Unknown (pT73m, pNX, cM0, G2, ER+, PR+, HER2: Not Assessed) - Signed by FTruitt Merle MD on 07/26/2020 Stage prefix: Initial diagnosis Histologic grading system: 3 grade system Residual tumor (R): R0 - None   06/17/2020 Surgery   LEFT BREAST LUMPECTOMY WITH  RADIOACTIVE SEED  LOCALIZATION by Dr Marlou Starks   06/17/2020 Pathology Results   FINAL MICROSCOPIC DIAGNOSIS:   A. BREAST, LEFT, LUMPECTOMY:  - Focus of microinvasive carcinoma (<1 mm) arising in association with  high-grade ductal carcinoma in situ, see comment  - Resection margins are negative; the superior margin is 0.2 cm from  focus of microinvasive carcinoma and DCIS  - Biopsy site changes  - Fibrocystic change with calcifications  - See oncology table     CURRENT THERAPY: Tamoxifen 20 mg once daily starting 07/2020  INTERVAL HISTORY: Ms. Brian returns for follow up as scheduled. Last seen by Dr. Burr Medico 05/20/21. She presented to ED 10/06/21 with abdominal pain and nausea, work up showed leukocytosis, transaminitis, and Tbili up to 5.5. CT negative for bowel obstruction but showed progressive biliary ductal dilatation and multiple stones in the CBD. She was admitted, s/p ERCP which showed choledocholithiasis and cholangitis; stones removed. Blood cultures positive for E coli. She was discharged 7/24 and completed oral antibiotics.  Her symptoms resolved.  Today she presents with her daughter, feeling well in general without specific complaints.  Tolerating tamoxifen with no side effects.  Chronic back pain is stable, denies new pain.  Denies vaginal or other bleeding except with IV sticks.  Denies concerns in her breast such as new lump/mass, nipple discharge or inversion, or skin change.  She continues to take care of her own affairs.  She takes vitamin D and other multivitamins/supplements in addition to her routine med regimen.  All other systems were reviewed with the patient and are negative.  MEDICAL HISTORY:  Past Medical History:  Diagnosis Date   Abnormal EKG    Asthma    Carotid artery occlusion    DDD (degenerative disc disease)    Diabetes mellitus without complication (HCC)    Family history of breast cancer    Family history of GI tract cancer    Family history of prostate  cancer    Hyperlipidemia    Hypertension    PAD (peripheral artery disease) (HCC)    Right lower quadrant abdominal abscess (HCC)    SBO (small bowel obstruction) ([place]) 03/15/2014   Spinal stenosis    Type II or unspecified type diabetes mellitus without mention of complication, not stated as uncontrolled 03/22/2013   Vitamin D deficiency     SURGICAL HISTORY: Past Surgical History:  Procedure Laterality Date   BREAST LUMPECTOMY Left 06/17/2020   BREAST LUMPECTOMY WITH RADIOACTIVE SEED LOCALIZATION Left 06/17/2020   Procedure: LEFT BREAST LUMPECTOMY WITH RADIOACTIVE SEED LOCALIZATION;  Surgeon: Jovita Kussmaul, MD;  Location: [facility];  Service: General;  Laterality: Left;   CHOLECYSTECTOMY     CYST EXCISION  back   ERCP N/A 10/09/2021   Procedure: ENDOSCOPIC RETROGRADE CHOLANGIOPANCREATOGRAPHY (ERCP);  Surgeon: Ladene Artist, MD;  Location: [facility];  Service: Gastroenterology;  Laterality: N/A;   HERNIA REPAIR     LAPAROSCOPIC APPENDECTOMY N/A 11/15/2014   Procedure: APPENDECTOMY LAPAROSCOPIC;  Surgeon: Michael Boston, MD;  Location: [facility];  Service: General;  Laterality: N/A;   MINI-LAPAROTOMY W/ TUBAL LIGATION  03/21/1962   REMOVAL OF STONES  10/09/2021   Procedure: REMOVAL OF STONES;  Surgeon: Ladene Artist, MD;  Location: [facility];  Service: Gastroenterology;;   Joan Mayans  10/09/2021   Procedure: SPHINCTEROTOMY;  Surgeon: Ladene Artist, MD;  Location: [facility];  Service: Gastroenterology;;    I have reviewed the social history and family history with the patient and they are unchanged from previous note.  ALLERGIES:  is allergic to ace inhibitors, metformin and related, penicillins, grapefruit extract, and minoxidil.  MEDICATIONS:  Current Outpatient Medications  Medication Sig Dispense Refill   acetaminophen (TYLENOL) 650 MG CR tablet Take 650 mg by mouth See admin instructions. Take 650 mg by mouth in the morning & evening and an  additional 650 mg during the day as needed for pain     amLODipine (NORVASC) 10 MG tablet Take 10 mg by mouth daily.     atenolol (TENORMIN) 100 MG tablet TAKE ONE TABLET BY MOUTH DAILY *PLEASE SCHEDULE F/U APPOINTMENT TO OBTAIN FURTHER REFILLS* (Patient taking differently: Take 100 mg by mouth in the morning.) 30 tablet 0   Cholecalciferol (VITAMIN D-3) 1000 UNITS CAPS Take 2,000 Units by mouth daily.      Coenzyme Q10 200 MG capsule Take 200 mg by mouth daily.     Cyanocobalamin (VITAMIN B 12 PO) Take 500 mcg by mouth daily.     diltiazem (CARDIZEM) 120 MG tablet TAKE ONE TABLET BY MOUTH DAILY (Patient taking differently: Take 120 mg by mouth in the morning.) 90 tablet 1   Flaxseed, Linseed, (FLAXSEED OIL) 1000 MG CAPS Take 1,000 mg by mouth 4 (four) times daily.     Magnesium 500 MG TABS Take 500 mg by mouth every evening.     Multiple Vitamin (MULTIVITAMIN ADULT PO) Take 1 tablet by mouth daily at 6 (six) AM.     tamoxifen (NOLVADEX) 20 MG tablet TAKE ONE TABLET BY MOUTH DAILY 90 tablet 3   vitamin C (ASCORBIC ACID) 250 MG tablet Take 250 mg by mouth daily.     cilostazol (PLETAL) 50 MG tablet Take 1 tablet (50 mg total) by mouth 2 (two) times daily. 180 tablet 0   Lancets (FREESTYLE) lancets Test glucose 1 time daily 100 each 1   mirabegron ER (MYRBETRIQ) 25 MG TB24 tablet Take 25 mg by mouth daily.     valsartan-hydrochlorothiazide (DIOVAN-HCT) 160-25 MG tablet TAKE ONE TABLET BY MOUTH DAILY (Patient taking differently: Take 1 tablet by mouth daily.) 90 tablet 1   No current facility-administered medications for this visit.    PHYSICAL EXAMINATION: ECOG PERFORMANCE STATUS: 0 - Asymptomatic  Vitals:   11/23/21 0857  BP: 132/61  Pulse: 65  Resp: 18  Temp: 98.7 F (37.1 C)  SpO2: 99%   Filed Weights   11/23/21 0857  Weight: 156 lb 3.2 oz (70.9 kg)    Exam limited to observation; pt had no complaints GENERAL:alert, no distress and comfortable SKIN: No rash to exposed  skin EYES: sclera clear LUNGS: normal breathing effort NEURO: alert & oriented x 3 with fluent speech Breast exam: Patient refused   LABORATORY DATA:  I have reviewed the data as listed    Latest Ref Rng & Units 11/23/2021    8:34 AM 10/11/2021    3:58 AM 10/10/2021    4:22 AM  CBC  WBC 4.0 - 10.5 K/uL 4.2  10.0  19.8   Hemoglobin 12.0 - 15.0 g/dL 11.9  9.9  11.2   Hematocrit 36.0 - 46.0 % 35.1  27.9  31.2   Platelets 150 - 400 K/uL 233  152  140         Latest Ref Rng & Units 11/23/2021    8:34 AM 10/11/2021    3:58 AM 10/10/2021    4:22 AM  CMP  Glucose 70 - 99 mg/dL 166  127  260   BUN 8 - 23 mg/dL 24  16  18   Creatinine 0.44 - 1.00 mg/dL 0.99  0.62  0.68   Sodium 135 - 145 mmol/L 138  137  134   Potassium 3.5 - 5.1 mmol/L 4.1  4.7  4.3   Chloride 98 - 111 mmol/L 104  107  103   CO2 22 - 32 mmol/L '28  24  23   ' Calcium 8.9 - 10.3 mg/dL 9.7  8.6  8.6   Total Protein 6.5 - 8.1 g/dL 8.2   7.3   Total Bilirubin 0.3 - 1.2 mg/dL 0.4   1.0   Alkaline Phos 38 - 126 U/L 55   171   AST 15 - 41 U/L 21   20   ALT 0 - 44 U/L 10   31       RADIOGRAPHIC STUDIES: I have personally reviewed the radiological images as listed and agreed with the findings in the report. No results found.   ASSESSMENT & PLAN: ROMONDA PARKER is a 88 y.o. female with    1. Malignant neoplasm of upper-inner quadrant of left breast, DCIS with microinvasion, pT60mN0, stage IA, high grade, ER+/PR+ -Diagnosis in 04/2020 with 1.1cm mass in left breast and biopsy showed high grade DCIS.  -S/p left lumpectomy on 06/17/20. Surgical path showed <152mof microinvasive carcinoma, otherwise showed high grade DICS and clear margins.  -no radiation recommended given her age and only microinvasion -She started tamoxifen in 07/2020 and is tolerating very well, no noticeable side effects.  Will continue for 5 years -last mammogram on 04/16/21 was benign. -Ms. LeRigdons clinically doing well.  Tolerating tamoxifen with no  significant side effects.  Labs are normal.  She declined a breast exam.  Overall no clinical concern for recurrence. -Continue surveillance and tamoxifen -Next visit in 6 months, or sooner if needed   2. Osteopenia  -Her 12/2017 DEXA showed osteopenia with lowest T-score -2 at [facility]. She is overdue for repeat; it was ordered at last visit but not done -She is on Tamoxifen, which can help strengthen her bones.  -We will repeat with next DEXA in 03/2022   3. Comorbidities: Spinal stenosis, PAD, HTN, HLD, borderline DM -Her spinal stenosis requires she ambulate with walker. She does have occasional back pain. She has some right hip pain as well.  -Her medications were recently altered by her PCP in April 2022 and instructed to change her diet.  -Recently admitted 7/19 - 24 2023 for biliary obstruction and choledocholithiasis; s/p ERCP with removal.  Symptoms and labs have normalized.  Continue follow-up with GI Dr. ArHavery MorosFollow-up with PCP and medical care team.    Plan: -Labs reviewed -Continue breast cancer surveillance and tamoxifen -Mammogram and DEXA in January 2024 -Follow-up in 6 months, or sooner if needed   Orders Placed This Encounter  Procedures   MM DIAG BREAST TOMO BILATERAL    Standing Status:   Future    Standing Expiration Date:   11/24/2022    Order Specific Question:   Reason for Exam (SYMPTOM  OR DIAGNOSIS REQUIRED)    Answer:   left breast cancer 2022    Order Specific Question:   Preferred imaging location?    Answer:   [facility]    Standing Status:   Future    Standing Expiration Date:   11/23/2022    Order Specific Question:   Reason for Exam (SYMPTOM  OR DIAGNOSIS REQUIRED)    Answer:   osteopenia; on tamoxifen  Order Specific Question:   Preferred imaging location?    Answer:   [facility]   All questions were answered. The patient knows to call the clinic with any problems, questions or concerns. No barriers to  learning were detected.      Alla Feeling, NP 11/23/21

## 2021-11-23 ENCOUNTER — Inpatient Hospital Stay: Payer: Medicare HMO | Admitting: Nurse Practitioner

## 2021-11-23 ENCOUNTER — Encounter: Payer: Self-pay | Admitting: Nurse Practitioner

## 2021-11-23 ENCOUNTER — Other Ambulatory Visit: Payer: Self-pay

## 2021-11-23 ENCOUNTER — Inpatient Hospital Stay: Payer: Medicare HMO | Attending: Nurse Practitioner

## 2021-11-23 VITALS — BP 132/61 | HR 65 | Temp 98.7°F | Resp 18 | Wt 156.2 lb

## 2021-11-23 DIAGNOSIS — I10 Essential (primary) hypertension: Secondary | ICD-10-CM | POA: Diagnosis not present

## 2021-11-23 DIAGNOSIS — M8580 Other specified disorders of bone density and structure, unspecified site: Secondary | ICD-10-CM | POA: Insufficient documentation

## 2021-11-23 DIAGNOSIS — C50212 Malignant neoplasm of upper-inner quadrant of left female breast: Secondary | ICD-10-CM | POA: Diagnosis not present

## 2021-11-23 DIAGNOSIS — Z8042 Family history of malignant neoplasm of prostate: Secondary | ICD-10-CM | POA: Diagnosis not present

## 2021-11-23 DIAGNOSIS — Z800 Family history of malignant neoplasm of digestive organs: Secondary | ICD-10-CM | POA: Diagnosis not present

## 2021-11-23 DIAGNOSIS — E2839 Other primary ovarian failure: Secondary | ICD-10-CM | POA: Diagnosis not present

## 2021-11-23 DIAGNOSIS — E1151 Type 2 diabetes mellitus with diabetic peripheral angiopathy without gangrene: Secondary | ICD-10-CM | POA: Diagnosis not present

## 2021-11-23 DIAGNOSIS — D0512 Intraductal carcinoma in situ of left breast: Secondary | ICD-10-CM

## 2021-11-23 DIAGNOSIS — Z803 Family history of malignant neoplasm of breast: Secondary | ICD-10-CM | POA: Diagnosis not present

## 2021-11-23 DIAGNOSIS — Z170 Estrogen receptor positive status [ER+]: Secondary | ICD-10-CM

## 2021-11-23 DIAGNOSIS — Z853 Personal history of malignant neoplasm of breast: Secondary | ICD-10-CM | POA: Insufficient documentation

## 2021-11-23 LAB — CMP (CANCER CENTER ONLY)
ALT: 10 U/L (ref 0–44)
AST: 21 U/L (ref 15–41)
Albumin: 3.9 g/dL (ref 3.5–5.0)
Alkaline Phosphatase: 55 U/L (ref 38–126)
Anion gap: 6 (ref 5–15)
BUN: 24 mg/dL — ABNORMAL HIGH (ref 8–23)
CO2: 28 mmol/L (ref 22–32)
Calcium: 9.7 mg/dL (ref 8.9–10.3)
Chloride: 104 mmol/L (ref 98–111)
Creatinine: 0.99 mg/dL (ref 0.44–1.00)
GFR, Estimated: 54 mL/min — ABNORMAL LOW (ref >60)
Glucose, Bld: 166 mg/dL — ABNORMAL HIGH (ref 70–99)
Potassium: 4.1 mmol/L (ref 3.5–5.1)
Sodium: 138 mmol/L (ref 135–145)
Total Bilirubin: 0.4 mg/dL (ref 0.3–1.2)
Total Protein: 8.2 g/dL — ABNORMAL HIGH (ref 6.5–8.1)

## 2021-11-23 LAB — CBC WITH DIFFERENTIAL (CANCER CENTER ONLY)
Abs Immature Granulocytes: 0.01 10*3/uL (ref 0.00–0.07)
Basophils Absolute: 0 10*3/uL (ref 0.0–0.1)
Basophils Relative: 1 %
Eosinophils Absolute: 0.2 10*3/uL (ref 0.0–0.5)
Eosinophils Relative: 4 %
HCT: 35.1 % — ABNORMAL LOW (ref 36.0–46.0)
Hemoglobin: 11.9 g/dL — ABNORMAL LOW (ref 12.0–15.0)
Immature Granulocytes: 0 %
Lymphocytes Relative: 35 %
Lymphs Abs: 1.5 10*3/uL (ref 0.7–4.0)
MCH: 31.6 pg (ref 26.0–34.0)
MCHC: 33.9 g/dL (ref 30.0–36.0)
MCV: 93.4 fL (ref 80.0–100.0)
Monocytes Absolute: 0.5 10*3/uL (ref 0.1–1.0)
Monocytes Relative: 13 %
Neutro Abs: 2 10*3/uL (ref 1.7–7.7)
Neutrophils Relative %: 47 %
Platelet Count: 233 10*3/uL (ref 150–400)
RBC: 3.76 MIL/uL — ABNORMAL LOW (ref 3.87–5.11)
RDW: 16.1 % — ABNORMAL HIGH (ref 11.5–15.5)
WBC Count: 4.2 10*3/uL (ref 4.0–10.5)
nRBC: 0 % (ref 0.0–0.2)

## 2021-11-25 ENCOUNTER — Telehealth: Payer: Self-pay | Admitting: Nurse Practitioner

## 2021-11-25 NOTE — Telephone Encounter (Signed)
Scheduled follow-up appointment per 9/5 los. Patient is aware.

## 2021-12-20 ENCOUNTER — Ambulatory Visit: Payer: Medicare HMO | Admitting: Podiatry

## 2021-12-20 DIAGNOSIS — B351 Tinea unguium: Secondary | ICD-10-CM

## 2021-12-20 DIAGNOSIS — M79675 Pain in left toe(s): Secondary | ICD-10-CM

## 2021-12-20 DIAGNOSIS — I739 Peripheral vascular disease, unspecified: Secondary | ICD-10-CM

## 2021-12-20 DIAGNOSIS — L84 Corns and callosities: Secondary | ICD-10-CM | POA: Diagnosis not present

## 2021-12-20 DIAGNOSIS — M79674 Pain in right toe(s): Secondary | ICD-10-CM | POA: Diagnosis not present

## 2021-12-20 NOTE — Progress Notes (Signed)
Subjective: 88 y.o. returns the office today for painful, elongated, thickened toenails which she cannot trim herself, mostly her 2nd toenails. Denies any redness or drainage around the nails. Denies any systemic complaints such as fevers, chills, nausea, vomiting.   PCP: Willey Blade, MD- last seen around November 05, 2021  Last BS was 90 or 99 this AM Last A1c was 6.7 on 10/11/2021  Objective: AAO 3, NAD DP/PT pulses 1/4, CRT less than 3 seconds Nails hypertrophic, dystrophic, elongated, brittle, discolored  10. There is tenderness overlying the nails 1-5 bilaterally. There is no surrounding erythema or drainage along the nail sites. Hyperkeratotic lesion left plantar heel without any underlying ulceration drainage or signs of infection. Hammertoes prsent  No open lesions or pre-ulcerative lesions are identified. No pain with calf compression, swelling, warmth, erythema.  Assessment: Patient presents with symptomatic onychomycosis, hyperkeratotic lesion, PAD  Plan: -Treatment options including alternatives, risks, complications were discussed -Nails sharply debrided 10 without complication/bleeding. -Sharply debrided hyperkeratotic lesion of the heel without any complications or bleeding.  Continue moisturizer. -Previous ABI noted.  Moderate arterial disease on the right side and mild on the left.  Unchanged.  Wants her circulation.  She has no symptoms currently. -Discussed daily foot inspection. If there are any changes, to call the office immediately.  -Follow-up in 3 months or sooner if any problems are to arise. In the meantime, encouraged to call the office with any questions, concerns, changes symptoms.  Celesta Gentile, DPM

## 2022-03-22 ENCOUNTER — Ambulatory Visit: Payer: Medicare HMO | Admitting: Podiatry

## 2022-04-15 ENCOUNTER — Ambulatory Visit: Payer: Medicare HMO | Admitting: Podiatry

## 2022-04-15 DIAGNOSIS — M79674 Pain in right toe(s): Secondary | ICD-10-CM

## 2022-04-15 DIAGNOSIS — L84 Corns and callosities: Secondary | ICD-10-CM

## 2022-04-15 DIAGNOSIS — B351 Tinea unguium: Secondary | ICD-10-CM

## 2022-04-15 DIAGNOSIS — M79675 Pain in left toe(s): Secondary | ICD-10-CM

## 2022-04-15 DIAGNOSIS — I739 Peripheral vascular disease, unspecified: Secondary | ICD-10-CM | POA: Diagnosis not present

## 2022-04-15 NOTE — Progress Notes (Signed)
Subjective: No chief complaint on file.  89 y.o. returns the office today for painful, elongated, thickened toenails which she cannot trim herself, mostly her 2nd toenails. Denies any redness or drainage around the nails. Denies any systemic complaints such as fevers, chills, nausea, vomiting.   PCP: Willey Blade, MD- last seen around November 05, 2021  Last BS was 90 or 99 this AM Last A1c was 6.7 on 10/11/2021  Objective: AAO 3, NAD DP/PT pulses 1/4, CRT less than 3 seconds Nails hypertrophic, dystrophic, elongated, brittle, discolored  10. There is tenderness overlying the nails 1-5 bilaterally. There is no surrounding erythema or drainage along the nail sites. Hyperkeratotic lesion left plantar heel without any underlying ulceration drainage or signs of infection. Hammertoes prsent  No open lesions or pre-ulcerative lesions are identified. No pain with calf compression, swelling, warmth, erythema.  Assessment: Patient presents with symptomatic onychomycosis, hyperkeratotic lesion, PAD  Plan: -Treatment options including alternatives, risks, complications were discussed -Nails sharply debrided 10 without complication/bleeding. -Sharply debrided hyperkeratotic lesion of the heel without any complications or bleeding.  Continue moisturizer. -Previous ABI noted 03/29/2021.  Moderate arterial disease on the right side and mild on the left.  Unchanged.  Wants her circulation.  She has no symptoms currently. -Discussed daily foot inspection. If there are any changes, to call the office immediately.  -Follow-up in 3 months or sooner if any problems are to arise. In the meantime, encouraged to call the office with any questions, concerns, changes symptoms.  Celesta Gentile, DPM

## 2022-04-27 ENCOUNTER — Ambulatory Visit
Admission: RE | Admit: 2022-04-27 | Discharge: 2022-04-27 | Disposition: A | Discharge status: Home / Self Care | Payer: Medicare HMO | Source: Ambulatory Visit | Attending: Nurse Practitioner | Admitting: Nurse Practitioner

## 2022-04-27 DIAGNOSIS — C50212 Malignant neoplasm of upper-inner quadrant of left female breast: Secondary | ICD-10-CM

## 2022-05-23 ENCOUNTER — Other Ambulatory Visit: Payer: Self-pay

## 2022-05-23 DIAGNOSIS — Z170 Estrogen receptor positive status [ER+]: Secondary | ICD-10-CM

## 2022-05-24 ENCOUNTER — Inpatient Hospital Stay: Payer: Medicare HMO | Admitting: Nurse Practitioner

## 2022-05-24 ENCOUNTER — Encounter: Payer: Self-pay | Admitting: Nurse Practitioner

## 2022-05-24 ENCOUNTER — Inpatient Hospital Stay: Payer: Medicare HMO | Attending: Nurse Practitioner

## 2022-05-24 ENCOUNTER — Other Ambulatory Visit: Payer: Self-pay

## 2022-05-24 VITALS — BP 129/61 | HR 71 | Temp 98.2°F | Resp 18 | Ht 68.0 in | Wt 148.0 lb

## 2022-05-24 DIAGNOSIS — Z803 Family history of malignant neoplasm of breast: Secondary | ICD-10-CM | POA: Insufficient documentation

## 2022-05-24 DIAGNOSIS — Z79810 Long term (current) use of selective estrogen receptor modulators (SERMs): Secondary | ICD-10-CM | POA: Diagnosis not present

## 2022-05-24 DIAGNOSIS — Z170 Estrogen receptor positive status [ER+]: Secondary | ICD-10-CM | POA: Insufficient documentation

## 2022-05-24 DIAGNOSIS — M25551 Pain in right hip: Secondary | ICD-10-CM | POA: Insufficient documentation

## 2022-05-24 DIAGNOSIS — M4800 Spinal stenosis, site unspecified: Secondary | ICD-10-CM | POA: Insufficient documentation

## 2022-05-24 DIAGNOSIS — E1151 Type 2 diabetes mellitus with diabetic peripheral angiopathy without gangrene: Secondary | ICD-10-CM | POA: Insufficient documentation

## 2022-05-24 DIAGNOSIS — M549 Dorsalgia, unspecified: Secondary | ICD-10-CM | POA: Insufficient documentation

## 2022-05-24 DIAGNOSIS — M8588 Other specified disorders of bone density and structure, other site: Secondary | ICD-10-CM | POA: Insufficient documentation

## 2022-05-24 DIAGNOSIS — C50212 Malignant neoplasm of upper-inner quadrant of left female breast: Secondary | ICD-10-CM | POA: Insufficient documentation

## 2022-05-24 DIAGNOSIS — I10 Essential (primary) hypertension: Secondary | ICD-10-CM | POA: Insufficient documentation

## 2022-05-24 DIAGNOSIS — Z8042 Family history of malignant neoplasm of prostate: Secondary | ICD-10-CM | POA: Insufficient documentation

## 2022-05-24 LAB — CMP (CANCER CENTER ONLY)
ALT: 13 U/L (ref 0–44)
AST: 28 U/L (ref 15–41)
Albumin: 3.6 g/dL (ref 3.5–5.0)
Alkaline Phosphatase: 44 U/L (ref 38–126)
Anion gap: 8 (ref 5–15)
BUN: 26 mg/dL — ABNORMAL HIGH (ref 8–23)
CO2: 24 mmol/L (ref 22–32)
Calcium: 8.9 mg/dL (ref 8.9–10.3)
Chloride: 106 mmol/L (ref 98–111)
Creatinine: 1.06 mg/dL — ABNORMAL HIGH (ref 0.44–1.00)
GFR, Estimated: 50 mL/min — ABNORMAL LOW (ref >60)
Glucose, Bld: 121 mg/dL — ABNORMAL HIGH (ref 70–99)
Potassium: 4.7 mmol/L (ref 3.5–5.1)
Sodium: 138 mmol/L (ref 135–145)
Total Bilirubin: 0.3 mg/dL (ref 0.3–1.2)
Total Protein: 7.9 g/dL (ref 6.5–8.1)

## 2022-05-24 LAB — CBC WITH DIFFERENTIAL (CANCER CENTER ONLY)
Abs Immature Granulocytes: 0.01 10*3/uL (ref 0.00–0.07)
Basophils Absolute: 0 10*3/uL (ref 0.0–0.1)
Basophils Relative: 1 %
Eosinophils Absolute: 0.1 10*3/uL (ref 0.0–0.5)
Eosinophils Relative: 2 %
HCT: 36.3 % (ref 36.0–46.0)
Hemoglobin: 12.5 g/dL (ref 12.0–15.0)
Immature Granulocytes: 0 %
Lymphocytes Relative: 40 %
Lymphs Abs: 1.6 10*3/uL (ref 0.7–4.0)
MCH: 31.9 pg (ref 26.0–34.0)
MCHC: 34.4 g/dL (ref 30.0–36.0)
MCV: 92.6 fL (ref 80.0–100.0)
Monocytes Absolute: 0.5 10*3/uL (ref 0.1–1.0)
Monocytes Relative: 12 %
Neutro Abs: 1.8 10*3/uL (ref 1.7–7.7)
Neutrophils Relative %: 45 %
Platelet Count: 160 10*3/uL (ref 150–400)
RBC: 3.92 MIL/uL (ref 3.87–5.11)
RDW: 14.5 % (ref 11.5–15.5)
WBC Count: 4 10*3/uL (ref 4.0–10.5)
nRBC: 0 % (ref 0.0–0.2)

## 2022-05-24 NOTE — Progress Notes (Signed)
Patient Care Team: Willey Blade, MD as PCP - General (Internal Medicine) Sharyne Peach, MD as Consulting Physician (Ophthalmology) Rockwell Germany, RN as Oncology Nurse Navigator Mauro Kaufmann, RN as Oncology Nurse Navigator Jovita Kussmaul, MD as Consulting Physician (General Surgery) Truitt Merle, MD as Consulting Physician (Hematology) Armbruster, Carlota Raspberry, MD as Consulting Physician (Gastroenterology)   CHIEF COMPLAINT: Follow up left breast cancer  Oncology History Overview Note  Cancer Staging Ductal carcinoma in situ (DCIS) of left breast Staging form: Breast, AJCC 8th Edition - Clinical stage from 05/13/2020: Stage 0 (cTis (DCIS), cN0, cM0, G3, ER+, PR+, HER2: Not Assessed) - Signed by Truitt Merle, MD on 05/19/2020 Stage prefix: Initial diagnosis    Malignant neoplasm of upper-inner quadrant of left breast in female, estrogen receptor positive ([place])  05/13/2020 Cancer Staging   Staging form: Breast, AJCC 8th Edition - Clinical stage from 05/13/2020: Stage 0 (cTis (DCIS), cN0, cM0, G3, ER+, PR+, HER2: Not Assessed) - Signed by Truitt Merle, MD on 05/19/2020 Stage prefix: Initial diagnosis   05/13/2020 Mammogram    IMPRESSION:  1.1 x 1.0 x 0.9 cm mass in the 9:30 o'clock position of the left breast 5 cm from the nipple with imaging features suspicious for malignancy.    05/13/2020 Initial Biopsy   Diagnosis Breast, left, needle core biopsy, 9:30 O'CLOCK left breast AMENDED DIAGNOSIS: - HIGH GRADE DUCTAL CARCINOMA IN SITU WITH CENTRAL NECROSIS AND CALCIFICATIONS. - FOCUS SUSPICIOUS FOR MICROINVASION. Microscopic Comment E-cadherin is positive supporting a ductal phenotype. In performing E-cadherin, subsequent leveling revealed a focus suspicious for microinvasion. Ancillary studies will be reported separately. Revised results reported to [facility] on 05/15/2020. Dr. Tresa Moore reviewed.   05/13/2020 Receptors her2   Results: IMMUNOHISTOCHEMICAL AND  MORPHOMETRIC ANALYSIS PERFORMED MANUALLY Estrogen Receptor: 95%, POSITIVE, STRONG STAINING INTENSITY Progesterone Receptor: 95%, POSITIVE, STRONG STAINING INTENSITY   05/18/2020 Initial Diagnosis   Ductal carcinoma in situ (DCIS) of left breast   06/10/2020 Genetic Testing   Negative genetic testing:  No pathogenic variants detected on the [employer] CancerNext-Expanded + RNAinsight panel. Two variants of uncertain significance (VUS) were detected - one in the BRCA1 gene called c.4735C>G (p.P1579A) and a second in the BRCA2 gene called c.1741T>C (p.S581P). The report date is 06/10/2020.  The CancerNext-Expanded + RNAinsight gene panel offered by [employer] and includes sequencing and rearrangement analysis for the following 77 genes: AIP, ALK, APC, ATM, AXIN2, BAP1, BARD1, BLM, BMPR1A, BRCA1, BRCA2, BRIP1, CDC73, CDH1, CDK4, CDKN1B, CDKN2A, CHEK2, CTNNA1, DICER1, FANCC, FH, FLCN, GALNT12, KIF1B, LZTR1, MAX, MEN1, MET, MLH1, MSH2, MSH3, MSH6, MUTYH, NBN, NF1, NF2, NTHL1, PALB2, PHOX2B, PMS2, POT1, PRKAR1A, PTCH1, PTEN, RAD51C, RAD51D, RB1, RECQL, RET, SDHA, SDHAF2, SDHB, SDHC, SDHD, SMAD4, SMARCA4, SMARCB1, SMARCE1, STK11, SUFU, TMEM127, TP53, TSC1, TSC2, VHL and XRCC2 (sequencing and deletion/duplication); EGFR, EGLN1, HOXB13, KIT, MITF, PDGFRA, POLD1 and POLE (sequencing only); EPCAM and GREM1 (deletion/duplication only). RNA data is routinely analyzed for use in variant interpretation for all genes.    06/17/2020 Cancer Staging   Staging form: Breast, AJCC 8th Edition - Pathologic stage from 06/17/2020: Stage Unknown (pT73m, pNX, cM0, G2, ER+, PR+, HER2: Not Assessed) - Signed by FTruitt Merle MD on 07/26/2020 Stage prefix: Initial diagnosis Histologic grading system: 3 grade system Residual tumor (R): R0 - None   06/17/2020 Surgery   LEFT BREAST LUMPECTOMY WITH RADIOACTIVE SEED LOCALIZATION by Dr TMarlou Starks  06/17/2020 Pathology Results   FINAL MICROSCOPIC DIAGNOSIS:   A. BREAST, LEFT, LUMPECTOMY:  -  Focus of microinvasive carcinoma (<1 mm) arising in association with  high-grade ductal carcinoma in situ, see comment  - Resection margins are negative; the superior margin is 0.2 cm from  focus of microinvasive carcinoma and DCIS  - Biopsy site changes  - Fibrocystic change with calcifications  - See oncology table      CURRENT THERAPY: Tamoxifen 20 mg once daily, starting 07/2020  INTERVAL HISTORY Kristine Burnett returns for follow up as scheduled, last seen by me 11/23/21. Mammogram 04/27/22 was benign, breast density cat C. DEXA scheduled in June.  Continues tamoxifen, tolerating well without side effects.  Denies concerns in her breast such as new lump/mass, nipple discharge or inversion, or skin change.    ROS  All other systems reviewed and negative  Past Medical History:  Diagnosis Date   Abnormal EKG    Asthma    Carotid artery occlusion    DDD (degenerative disc disease)    Diabetes mellitus without complication ([place])    Family history of breast cancer    Family history of GI tract cancer    Family history of prostate cancer    Hyperlipidemia    Hypertension    PAD (peripheral artery disease) ([facility])    Right lower quadrant abdominal abscess ([facility])    SBO (small bowel obstruction) ([place]) 03/15/2014   Spinal stenosis    Type II or unspecified type diabetes mellitus without mention of complication, not stated as uncontrolled 03/22/2013   Vitamin D deficiency      Past Surgical History:  Procedure Laterality Date   BREAST LUMPECTOMY Left 06/17/2020   BREAST LUMPECTOMY WITH RADIOACTIVE SEED LOCALIZATION Left 06/17/2020   Procedure: LEFT BREAST LUMPECTOMY WITH RADIOACTIVE SEED LOCALIZATION;  Surgeon: Jovita Kussmaul, MD;  Location: [place];  Service: General;  Laterality: Left;   CHOLECYSTECTOMY     CYST EXCISION  back   ERCP N/A 10/09/2021   Procedure: ENDOSCOPIC RETROGRADE CHOLANGIOPANCREATOGRAPHY (ERCP);  Surgeon: Ladene Artist, MD;  Location: [facility];   Service: Gastroenterology;  Laterality: N/A;   HERNIA REPAIR     LAPAROSCOPIC APPENDECTOMY N/A 11/15/2014   Procedure: APPENDECTOMY LAPAROSCOPIC;  Surgeon: Michael Boston, MD;  Location: [facility];  Service: General;  Laterality: N/A;   MINI-LAPAROTOMY W/ TUBAL LIGATION  03/21/1962   REMOVAL OF STONES  10/09/2021   Procedure: REMOVAL OF STONES;  Surgeon: Ladene Artist, MD;  Location: [facility];  Service: Gastroenterology;;   Joan Mayans  10/09/2021   Procedure: [id number];  Surgeon: Ladene Artist, MD;  Location: [facility];  Service: Gastroenterology;;     Outpatient Encounter Medications as of 05/24/2022  Medication Sig Note   acetaminophen (TYLENOL) 650 MG CR tablet Take 650 mg by mouth See admin instructions. Take 650 mg by mouth in the morning & evening and an additional 650 mg during the day as needed for pain    amLODipine (NORVASC) 10 MG tablet Take 10 mg by mouth daily.    atenolol (TENORMIN) 100 MG tablet TAKE ONE TABLET BY MOUTH DAILY *PLEASE SCHEDULE F/U APPOINTMENT TO OBTAIN FURTHER REFILLS* (Patient taking differently: Take 100 mg by mouth in the morning.)    Cholecalciferol (VITAMIN D-3) 1000 UNITS CAPS Take 2,000 Units by mouth daily.     cilostazol (PLETAL) 50 MG tablet Take 1 tablet (50 mg total) by mouth 2 (two) times daily.    Coenzyme Q10 200 MG capsule Take 200 mg by mouth daily.    Cyanocobalamin (VITAMIN B 12 PO) Take 500 mcg by  mouth daily.    diltiazem (CARDIZEM) 120 MG tablet TAKE ONE TABLET BY MOUTH DAILY (Patient taking differently: Take 120 mg by mouth in the morning.)    Flaxseed, Linseed, (FLAXSEED OIL) 1000 MG CAPS Take 1,000 mg by mouth 4 (four) times daily.    Lancets (FREESTYLE) lancets Test glucose 1 time daily    Magnesium 500 MG TABS Take 500 mg by mouth every evening.    Multiple Vitamin (MULTIVITAMIN ADULT PO) Take 1 tablet by mouth daily at 6 (six) AM.    tamoxifen (NOLVADEX) 20 MG tablet TAKE ONE TABLET BY MOUTH DAILY     valsartan-hydrochlorothiazide (DIOVAN-HCT) 160-25 MG tablet TAKE ONE TABLET BY MOUTH DAILY (Patient taking differently: Take 1 tablet by mouth daily.)    vitamin C (ASCORBIC ACID) 250 MG tablet Take 250 mg by mouth daily.    mirabegron ER (MYRBETRIQ) 25 MG TB24 tablet Take 25 mg by mouth daily. 10/06/2021: Patient is unsure if she takes this   No facility-administered encounter medications on file as of 05/24/2022.     Today's Vitals   05/24/22 1000 05/24/22 1041  BP:  129/61  Pulse:  71  Resp:  18  Temp:  98.2 F (36.8 C)  TempSrc:  Oral  SpO2:  97%  Weight:  148 lb (67.1 kg)  Height:  '5\' 8"'$  (1.727 m)  PainSc: 0-No pain    Body mass index is 22.5 kg/m.   PHYSICAL EXAM GENERAL:alert, no distress and comfortable SKIN: no rash  EYES: sclera clear NECK: without mass LYMPH:  no palpable cervical or supraclavicular lymphadenopathy  LUNGS: clear with normal breathing effort HEART: regular rate & rhythm, no lower extremity edema ABDOMEN: abdomen soft, non-tender and normal bowel sounds NEURO: alert & oriented x 3 with fluent speech, no focal motor/sensory deficits Breast exam: Symmetrical without nipple discharge or inversion.  S/p left lumpectomy, incisions completely healed with minimal scar tissue.  No palpable mass or nodularity in either breast or axilla that I could appreciate   CBC    Component Value Date/Time   WBC 4.0 05/24/2022 0952   WBC 10.0 10/11/2021 0358   RBC 3.92 05/24/2022 0952   HGB 12.5 05/24/2022 0952   HCT 36.3 05/24/2022 0952   PLT 160 05/24/2022 0952   MCV 92.6 05/24/2022 0952   MCH 31.9 05/24/2022 0952   MCHC 34.4 05/24/2022 0952   RDW 14.5 05/24/2022 0952   LYMPHSABS 1.6 05/24/2022 0952   MONOABS 0.5 05/24/2022 0952   EOSABS 0.1 05/24/2022 0952   BASOSABS 0.0 05/24/2022 0952     CMP     Component Value Date/Time   NA 138 05/24/2022 0952   K 4.7 05/24/2022 0952   CL 106 05/24/2022 0952   CO2 24 05/24/2022 0952   GLUCOSE 121 (H) 05/24/2022  0952   BUN 26 (H) 05/24/2022 0952   CREATININE 1.06 (H) 05/24/2022 0952   CREATININE 0.96 03/12/2014 1635   CALCIUM 8.9 05/24/2022 0952   PROT 7.9 05/24/2022 0952   ALBUMIN 3.6 05/24/2022 0952   AST 28 05/24/2022 0952   ALT 13 05/24/2022 0952   ALKPHOS 44 05/24/2022 0952   BILITOT 0.3 05/24/2022 0952   GFRNONAA 50 (L) 05/24/2022 0952   GFRNONAA 55 (L) 03/12/2014 1635   GFRAA >60 01/01/2018 0521   GFRAA 64 03/12/2014 1635     ASSESSMENT & PLAN:Kristine Burnett is a 89 y.o. female with    1. Malignant neoplasm of upper-inner quadrant of left breast, DCIS with microinvasion, pT34mN0, stage IA,  high grade, ER+/PR+ -Diagnosis in 04/2020 with 1.1cm mass in left breast and biopsy showed high grade DCIS.  -S/p left lumpectomy on 06/17/20. Surgical path showed <49m of microinvasive carcinoma, otherwise showed high grade DICS and clear margins.  -no radiation recommended given her age and only microinvasion -She started tamoxifen in 07/2020 and is tolerating very well, no noticeable side effects.  Will continue for total of 5 years -Ms. LAylesis clinically doing well.  Tolerating tamoxifen with no significant side effects.  Breast exam is benign, labs are unremarkable.   -Recent mammogram 04/27/2022 was benign.  Overall no clinical concern for breast cancer recurrence -Sinew surveillance and tamoxifen. -Follow-up in 6 months, or sooner if needed   2. Osteopenia  -Her 12/2017 DEXA showed osteopenia with lowest T-score -2 at [facility]. She is overdue for repeat; it was ordered at last visit but not done -She is on Tamoxifen, which can help strengthen her bones.  - DEXA scheduled 08/23/2022  3. Comorbidities: Spinal stenosis, PAD, HTN, HLD, borderline DM -Her spinal stenosis requires she ambulate with walker. She does have occasional back pain. She has some right hip pain as well.  -Her medications were recently altered by her PCP in April 2022 and instructed to change her diet.  -Admitted 7/19 - 24  /2023 for biliary obstruction and choledocholithiasis; s/p ERCP with removal.  Symptoms and labs have normalized.  Continue follow-up with GI Dr. AHavery Moros-Follow-up with PCP and medical care team.   PLAN: -Recent mammogram and today's labs reviewed -Continue breast cancer surveillance and tamoxifen -DEXA scheduled in June, will call with results  -Follow-up in 6 months, or sooner if needed    All questions were answered. The patient knows to call the clinic with any problems, questions or concerns. No barriers to learning were detected.  LCira Rue NP-C 05/24/2022

## 2022-06-04 ENCOUNTER — Other Ambulatory Visit: Payer: Self-pay

## 2022-06-04 ENCOUNTER — Emergency Department (HOSPITAL_COMMUNITY)
Admission: EM | Admit: 2022-06-04 | Discharge: 2022-06-04 | Disposition: A | Discharge status: Home / Self Care | Payer: Medicare HMO | Attending: Emergency Medicine | Admitting: Emergency Medicine

## 2022-06-04 ENCOUNTER — Encounter (HOSPITAL_COMMUNITY): Payer: Self-pay

## 2022-06-04 DIAGNOSIS — J45909 Unspecified asthma, uncomplicated: Secondary | ICD-10-CM | POA: Insufficient documentation

## 2022-06-04 DIAGNOSIS — I10 Essential (primary) hypertension: Secondary | ICD-10-CM | POA: Insufficient documentation

## 2022-06-04 DIAGNOSIS — R350 Frequency of micturition: Secondary | ICD-10-CM | POA: Diagnosis not present

## 2022-06-04 DIAGNOSIS — N819 Female genital prolapse, unspecified: Secondary | ICD-10-CM | POA: Insufficient documentation

## 2022-06-04 DIAGNOSIS — E119 Type 2 diabetes mellitus without complications: Secondary | ICD-10-CM | POA: Insufficient documentation

## 2022-06-04 DIAGNOSIS — Z79899 Other long term (current) drug therapy: Secondary | ICD-10-CM | POA: Insufficient documentation

## 2022-06-04 NOTE — ED Provider Notes (Signed)
[facility]ER[facility]ENC[facility] DEPARTMENT AT [facility] Provider Note   CSN: [id number] Arrival date & time: 06/04/22  0028     History  Chief Complaint  Patient presents with   Vaginal Prolapse    Kristine Burnett is a 89 y.o. female.  The history is provided by the patient and a relative.  Patient with history of hypertension presents with concern for prolapse.  She reports over the past several weeks she has lower pelvic pain and she thinks she is having a uterine prolapse.  It worsens with certain positions.  She is also having urinary frequency.  No fevers or vomiting.  No significant abdominal pain or back pain. She reports the past 4 nights has been worsening and she is been having difficulty sleeping.     Past Medical History:  Diagnosis Date   Abnormal EKG    Asthma    Carotid artery occlusion    DDD (degenerative disc disease)    Diabetes mellitus without complication ([place])    Family history of breast cancer    Family history of GI tract cancer    Family history of prostate cancer    Hyperlipidemia    Hypertension    PAD (peripheral artery disease) ([facility])    Right lower quadrant abdominal abscess ([facility])    SBO (small bowel obstruction) ([place]) 03/15/2014   Spinal stenosis    Type II or unspecified type diabetes mellitus without mention of complication, not stated as uncontrolled 03/22/2013   Vitamin D deficiency     Home Medications Prior to Admission medications   Medication Sig Start Date End Date Taking? Authorizing Provider  acetaminophen (TYLENOL) 650 MG CR tablet Take 650 mg by mouth See admin instructions. Take 650 mg by mouth in the morning & evening and an additional 650 mg during the day as needed for pain    [provider]  amLODipine (NORVASC) 10 MG tablet Take 10 mg by mouth daily. 11/08/17   [provider]  atenolol (TENORMIN) 100 MG tablet TAKE ONE TABLET BY MOUTH DAILY *PLEASE SCHEDULE F/U APPOINTMENT TO OBTAIN FURTHER  REFILLS* Patient taking differently: Take 100 mg by mouth in the morning. 01/27/20   Patwardhan, Reynold Bowen, MD  Cholecalciferol (VITAMIN D-3) 1000 UNITS CAPS Take 2,000 Units by mouth daily.     [provider]  cilostazol (PLETAL) 50 MG tablet Take 1 tablet (50 mg total) by mouth 2 (two) times daily. 10/17/21   Manuella Ghazi, Pratik D, DO  Coenzyme Q10 200 MG capsule Take 200 mg by mouth daily.    [provider]  Cyanocobalamin (VITAMIN B 12 PO) Take 500 mcg by mouth daily.    [provider]  diltiazem (CARDIZEM) 120 MG tablet TAKE ONE TABLET BY MOUTH DAILY Patient taking differently: Take 120 mg by mouth in the morning. 05/17/19   Patwardhan, Manish J, MD  Flaxseed, Linseed, (FLAXSEED OIL) 1000 MG CAPS Take 1,000 mg by mouth 4 (four) times daily.    [provider]  Lancets (FREESTYLE) lancets Test glucose 1 time daily 08/16/13   Unk Pinto, MD  Magnesium 500 MG TABS Take 500 mg by mouth every evening.    [provider]  mirabegron ER (MYRBETRIQ) 25 MG TB24 tablet Take 25 mg by mouth daily.    [provider]  Multiple Vitamin (MULTIVITAMIN ADULT PO) Take 1 tablet by mouth daily at 6 (six) AM.    [provider]  tamoxifen (NOLVADEX) 20 MG tablet TAKE ONE TABLET BY  MOUTH DAILY 11/05/21   Truitt Merle, MD  valsartan-hydrochlorothiazide (DIOVAN-HCT) 160-25 MG tablet TAKE ONE TABLET BY MOUTH DAILY Patient taking differently: Take 1 tablet by mouth daily. 05/08/19   Patwardhan, Reynold Bowen, MD  vitamin C (ASCORBIC ACID) 250 MG tablet Take 250 mg by mouth daily.    [provider]      Allergies    Ace inhibitors, Metformin and related, Penicillins, Grapefruit extract, and Minoxidil    Review of Systems   Review of Systems  Constitutional:  Negative for fever.  Gastrointestinal:  Negative for vomiting.  Genitourinary:  Positive for frequency.    Physical Exam Updated Vital Signs BP (!) 162/81 (BP Location: Right Arm)   Pulse 90    Temp 98.9 F (37.2 C) (Oral)   Resp 17   Ht 1.727 m (5\' 8" )   Wt 67 kg   SpO2 100%   BMI 22.46 kg/m  Physical Exam CONSTITUTIONAL: Elderly, no acute distress HEAD: Normocephalic/atraumatic EYES: EOMI ABDOMEN: soft, nontender GU: Pelvic exam performed with nurse Caryl Comes present No obvious prolapse at rest.  There does appear to be mild uterine prolapse during coughing NEURO: Pt is awake/alert/appropriate, moves all extremitiesx4.  No facial droop.   EXTREMITIES: full ROM SKIN: warm, color normal PSYCH: no abnormalities of mood noted, alert and oriented to situation  ED Results / Procedures / Treatments   Labs (all labs ordered are listed, but only abnormal results are displayed) Labs Reviewed - No data to display  EKG None  Radiology No results found.  Procedures Procedures    Medications Ordered in ED Medications - No data to display  ED Course/ Medical Decision Making/ A&P Clinical Course as of 06/04/22 0216  Sat Jun 04, 2022  0215 Patient stable, no acute distress.  She does have evidence of pelvic organ prolapse intermittently.  No other acute issues.  Will refer to gynecology.  Discussed care plan at length with patient and her daughter [DW]    Clinical Course User Index [DW] Ripley Fraise, MD                             Medical Decision Making          Final Clinical Impression(s) / ED Diagnoses Final diagnoses:  Female genital prolapse, unspecified type    Rx / DC Orders ED Discharge Orders     None         Ripley Fraise, MD 06/04/22 [phone]

## 2022-06-04 NOTE — ED Triage Notes (Signed)
Patient reports vaginal pain and believe her uterus is prolapsed because something "ball shaped" is sticking out of her. States when she lies down she needs to urinate and can't get any rest.

## 2022-06-30 ENCOUNTER — Encounter: Payer: Self-pay | Admitting: Family Medicine

## 2022-06-30 ENCOUNTER — Ambulatory Visit (INDEPENDENT_AMBULATORY_CARE_PROVIDER_SITE_OTHER): Payer: Medicare HMO | Admitting: Family Medicine

## 2022-06-30 ENCOUNTER — Other Ambulatory Visit: Payer: Self-pay

## 2022-06-30 VITALS — BP 182/95 | HR 82 | Wt 156.7 lb

## 2022-06-30 DIAGNOSIS — N816 Rectocele: Secondary | ICD-10-CM

## 2022-06-30 DIAGNOSIS — R32 Unspecified urinary incontinence: Secondary | ICD-10-CM | POA: Insufficient documentation

## 2022-06-30 DIAGNOSIS — N3945 Continuous leakage: Secondary | ICD-10-CM | POA: Diagnosis not present

## 2022-06-30 DIAGNOSIS — N8110 Cystocele, unspecified: Secondary | ICD-10-CM

## 2022-06-30 NOTE — Progress Notes (Signed)
   Subjective:    Patient ID: Kristine Burnett is a 89 y.o. female presenting with Gynecologic Exam  on 06/30/2022  HPI: Feels like she is sitting on a ball. Has some urinary incontinence. This comes from certain positions.    Review of Systems  Constitutional:  Negative for chills and fever.  Respiratory:  Negative for shortness of breath.   Cardiovascular:  Negative for chest pain.  Gastrointestinal:  Negative for abdominal pain, nausea and vomiting.  Genitourinary:  Negative for dysuria.  Skin:  Negative for rash.      Objective:    BP (!) 182/95   Pulse 82   Wt 156 lb 11.2 oz (71.1 kg)   BMI 23.83 kg/m  Physical Exam Exam conducted with a chaperone present.  Constitutional:      General: She is not in acute distress.    Appearance: She is well-developed.  HENT:     Head: Normocephalic and atraumatic.  Eyes:     General: No scleral icterus. Cardiovascular:     Rate and Rhythm: Normal rate.  Pulmonary:     Effort: Pulmonary effort is normal.  Abdominal:     Palpations: Abdomen is soft.  Genitourinary:    Comments: Large cystocele and large rectocele Musculoskeletal:     Cervical back: Neck supple.  Skin:    General: Skin is warm and dry.  Neurological:     Mental Status: She is alert and oriented to person, place, and time.    Procedure: Patient with continuous urinary incontinence. Pessary incontinence disc, fit size 67mm placed and patient ambulated around the room. Did lose urine. On recheck knob was below urethra.      Assessment & Plan:   Problem List Items Addressed This Visit       Unprioritized   Cystocele with rectocele - Primary    Will order one size up for pessary      Urinary incontinence    Pessary incontinence disc with support 80 mm to start - may need some urethral bulking.        Return in about 1 week (around 07/07/2022) for pessary fitting.  Reva Bores, MD 06/30/2022 2:40 PM

## 2022-06-30 NOTE — Assessment & Plan Note (Signed)
Pessary incontinence disc with support 80 mm to start - may need some urethral bulking.

## 2022-06-30 NOTE — Assessment & Plan Note (Signed)
Will order one size up for pessary

## 2022-07-15 ENCOUNTER — Ambulatory Visit: Payer: Medicare HMO | Admitting: Podiatry

## 2022-08-01 ENCOUNTER — Ambulatory Visit: Payer: Medicare HMO | Admitting: Podiatry

## 2022-08-01 ENCOUNTER — Encounter: Payer: Self-pay | Admitting: Podiatry

## 2022-08-01 DIAGNOSIS — M79675 Pain in left toe(s): Secondary | ICD-10-CM | POA: Diagnosis not present

## 2022-08-01 DIAGNOSIS — M79674 Pain in right toe(s): Secondary | ICD-10-CM

## 2022-08-01 DIAGNOSIS — B351 Tinea unguium: Secondary | ICD-10-CM

## 2022-08-01 DIAGNOSIS — L84 Corns and callosities: Secondary | ICD-10-CM | POA: Diagnosis not present

## 2022-08-01 DIAGNOSIS — I739 Peripheral vascular disease, unspecified: Secondary | ICD-10-CM | POA: Diagnosis not present

## 2022-08-01 NOTE — Progress Notes (Signed)
Subjective: Chief Complaint  Patient presents with   Debridement    Trim toenails/calluses    89 y.o. returns the office today for painful, elongated, thickened toenails which she cannot trim herself.  Denies any redness or drainage around the nails. Denies any systemic complaints such as fevers, chills, nausea, vomiting.   PCP: Andi Devon, MD- last seen around November 05, 2021  Last BS was 90 or 99 this AM Last A1c was 6.7 on 10/11/2021  Objective: AAO 3, NAD DP/PT pulses 1/4, CRT less than 3 seconds Nails hypertrophic, dystrophic, elongated, brittle, discolored  10. There is tenderness overlying the nails 1-5 bilaterally. There is no surrounding erythema or drainage along the nail sites. Hyperkeratotic lesion left plantar heel without any underlying ulceration drainage or signs of infection. Hammertoes prsent  No open lesions or pre-ulcerative lesions are identified. No pain with calf compression, swelling, warmth, erythema.  Assessment: Patient presents with symptomatic onychomycosis, hyperkeratotic lesion, PAD  Plan: -Treatment options including alternatives, risks, complications were discussed -Nails sharply debrided 10 without complication/bleeding. -Sharply debrided hyperkeratotic lesion of the heel without any complications or bleeding.  Continue moisturizer. -Previous ABI noted 03/29/2021.  Moderate arterial disease on the right side and mild on the left.  Unchanged.  Wants her circulation.  She has no symptoms currently. -Discussed daily foot inspection. If there are any changes, to call the office immediately.  -Follow-up in 3 months or sooner if any problems are to arise. In the meantime, encouraged to call the office with any questions, concerns, changes symptoms.  Ovid Curd, DPM

## 2022-08-08 ENCOUNTER — Ambulatory Visit (INDEPENDENT_AMBULATORY_CARE_PROVIDER_SITE_OTHER): Payer: Medicare HMO | Admitting: Family Medicine

## 2022-08-08 ENCOUNTER — Other Ambulatory Visit: Payer: Self-pay

## 2022-08-08 VITALS — BP 161/78 | HR 70 | Ht 68.0 in | Wt 154.1 lb

## 2022-08-08 DIAGNOSIS — N8110 Cystocele, unspecified: Secondary | ICD-10-CM

## 2022-08-08 DIAGNOSIS — N3945 Continuous leakage: Secondary | ICD-10-CM

## 2022-08-08 DIAGNOSIS — N816 Rectocele: Secondary | ICD-10-CM | POA: Diagnosis not present

## 2022-08-08 NOTE — Progress Notes (Signed)
   Subjective:    Patient ID: Kristine Burnett is a 89 y.o. female presenting with Pessary placement  on 08/08/2022  HPI: Here today for pessary fitting. Has large cystocele with incontinence.  Review of Systems  Constitutional:  Negative for chills and fever.  Respiratory:  Negative for shortness of breath.   Cardiovascular:  Negative for chest pain.  Gastrointestinal:  Negative for abdominal pain, nausea and vomiting.  Genitourinary:  Negative for dysuria.  Skin:  Negative for rash.      Objective:    BP (!) 161/78   Pulse 70   Ht 5\' 8"  (1.727 m)   Wt 154 lb 1.6 oz (69.9 kg)   BMI 23.43 kg/m  Physical Exam Exam conducted with a chaperone present.  Constitutional:      General: She is not in acute distress.    Appearance: She is well-developed.  HENT:     Head: Normocephalic and atraumatic.  Eyes:     General: No scleral icterus. Neck:     Comments: BUS normal, vagina is without lesion, cystocele noted  Cardiovascular:     Rate and Rhythm: Normal rate.  Pulmonary:     Effort: Pulmonary effort is normal.  Abdominal:     Palpations: Abdomen is soft.  Musculoskeletal:     Cervical back: Neck supple.  Skin:    General: Skin is warm and dry.  Neurological:     Mental Status: She is alert and oriented to person, place, and time.    Pessary placed without incident. Patient ambulated and felt fine with it in.     Assessment & Plan:   Problem List Items Addressed This Visit       Unprioritized   Cystocele with rectocele - Primary    s/p pessary placement      Urinary incontinence    If leakage persists with pessary with support and incontinence knob, consider urethral bulking with Uro/GYN.       Return in about 3 months (around 11/08/2022) for pessary check.  Reva Bores, MD 08/08/2022 4:36 PM

## 2022-08-08 NOTE — Assessment & Plan Note (Signed)
s/p pessary placement

## 2022-08-08 NOTE — Assessment & Plan Note (Signed)
If leakage persists with pessary with support and incontinence knob, consider urethral bulking with Uro/GYN.

## 2022-08-23 ENCOUNTER — Ambulatory Visit
Admission: RE | Admit: 2022-08-23 | Discharge: 2022-08-23 | Disposition: A | Discharge status: Home / Self Care | Payer: Medicare HMO | Source: Ambulatory Visit | Attending: Nurse Practitioner | Admitting: Nurse Practitioner

## 2022-08-23 DIAGNOSIS — E2839 Other primary ovarian failure: Secondary | ICD-10-CM

## 2022-08-25 ENCOUNTER — Telehealth: Payer: Self-pay

## 2022-08-25 NOTE — Telephone Encounter (Signed)
Spoke with pt via telephone to go over the results of the pt's most recent BMD.  Informed pt that BMD improved in her spine but slightly worsened in right femur.  Stated overall the pt's osteopenia still mild. Stated that Santiago Glad, NP would like for the pt to continue the tamoxifen and to take calcium/vit D.  Stated Clayborn Heron would also like for the pt to do weight bearing exercise.  Pt verbalized understanding and had no further questions or concerns at this time.

## 2022-11-01 ENCOUNTER — Ambulatory Visit: Payer: Medicare HMO | Admitting: Podiatry

## 2022-11-01 DIAGNOSIS — M79674 Pain in right toe(s): Secondary | ICD-10-CM | POA: Diagnosis not present

## 2022-11-01 DIAGNOSIS — M79675 Pain in left toe(s): Secondary | ICD-10-CM | POA: Diagnosis not present

## 2022-11-01 DIAGNOSIS — B351 Tinea unguium: Secondary | ICD-10-CM | POA: Diagnosis not present

## 2022-11-01 DIAGNOSIS — I739 Peripheral vascular disease, unspecified: Secondary | ICD-10-CM

## 2022-11-01 NOTE — Progress Notes (Signed)
Subjective: No chief complaint on file.   89 y.o. returns the office today for painful, elongated, thickened toenails which she cannot trim herself.  Denies any redness or drainage around the nails. No other concerns today.   PCP: Andi Devon, MD- last seen around November 05, 2021  Last A1c was 6.7 on 10/11/2021  Objective: AAO 3, NAD DP/PT pulses 1/4, CRT less than 3 seconds Nails hypertrophic, dystrophic, elongated, brittle, discolored  10. There is tenderness overlying the nails 1-5 bilaterally. There is no surrounding erythema or drainage along the nail sites. No open lesions or pre-ulcerative lesions are identified. No pain with calf compression, swelling, warmth, erythema.  Assessment: Patient presents with symptomatic onychomycosis, hyperkeratotic lesion, PAD  Plan: -Treatment options including alternatives, risks, complications were discussed -Nails sharply debrided 10 without complication/bleeding. -Previous ABI noted 03/29/2021.  Moderate arterial disease on the right side and mild on the left.  Unchanged.  Wants her circulation.  She has no symptoms currently. -Discussed daily foot inspection. If there are any changes, to call the office immediately.  -Follow-up in 3 months or sooner if any problems are to arise. In the meantime, encouraged to call the office with any questions, concerns, changes symptoms.  Ovid Curd, DPM

## 2022-11-21 NOTE — Progress Notes (Unsigned)
Patient Care Team: Andi Devon, MD as PCP - General (Internal Medicine) Elise Benne, MD as Consulting Physician (Ophthalmology) Donnelly Angelica, RN as Oncology Nurse Navigator Pershing Proud, RN as Oncology Nurse Navigator Griselda Miner, MD as Consulting Physician (General Surgery) Malachy Mood, MD as Consulting Physician (Hematology) Armbruster, Willaim Rayas, MD as Consulting Physician (Gastroenterology)   CHIEF COMPLAINT: Follow up left breast cancer   Oncology History Overview Note  Cancer Staging Ductal carcinoma in situ (DCIS) of left breast Staging form: Breast, AJCC 8th Edition - Clinical stage from 05/13/2020: Stage 0 (cTis (DCIS), cN0, cM0, G3, ER+, PR+, HER2: Not Assessed) - Signed by Malachy Mood, MD on 05/19/2020 Stage prefix: Initial diagnosis    Malignant neoplasm of upper-inner quadrant of left breast in female, estrogen receptor positive (HCC)  05/13/2020 Cancer Staging   Staging form: Breast, AJCC 8th Edition - Clinical stage from 05/13/2020: Stage 0 (cTis (DCIS), cN0, cM0, G3, ER+, PR+, HER2: Not Assessed) - Signed by Malachy Mood, MD on 05/19/2020 Stage prefix: Initial diagnosis   05/13/2020 Mammogram    IMPRESSION:  1.1 x 1.0 x 0.9 cm mass in the 9:30 o'clock position of the left breast 5 cm from the nipple with imaging features suspicious for malignancy.    05/13/2020 Initial Biopsy   Diagnosis Breast, left, needle core biopsy, 9:30 O'CLOCK left breast AMENDED DIAGNOSIS: - HIGH GRADE DUCTAL CARCINOMA IN SITU WITH CENTRAL NECROSIS AND CALCIFICATIONS. - FOCUS SUSPICIOUS FOR MICROINVASION. Microscopic Comment E-cadherin is positive supporting a ductal phenotype. In performing E-cadherin, subsequent leveling revealed a focus suspicious for microinvasion. Ancillary studies will be reported separately. Revised results reported to [facility] on 05/15/2020. Dr. Berneice Heinrich reviewed.   05/13/2020 Receptors her2   Results: IMMUNOHISTOCHEMICAL AND  MORPHOMETRIC ANALYSIS PERFORMED MANUALLY Estrogen Receptor: 95%, POSITIVE, STRONG STAINING INTENSITY Progesterone Receptor: 95%, POSITIVE, STRONG STAINING INTENSITY   05/18/2020 Initial Diagnosis   Ductal carcinoma in situ (DCIS) of left breast   06/10/2020 Genetic Testing   Negative genetic testing:  No pathogenic variants detected on the [employer] CancerNext-Expanded + RNAinsight panel. Two variants of uncertain significance (VUS) were detected - one in the BRCA1 gene called c.4735C>G (p.P1579A) and a second in the BRCA2 gene called c.1741T>C (p.S581P). The report date is 06/10/2020.  The CancerNext-Expanded + RNAinsight gene panel offered by [employer] and includes sequencing and rearrangement analysis for the following 77 genes: AIP, ALK, APC, ATM, AXIN2, BAP1, BARD1, BLM, BMPR1A, BRCA1, BRCA2, BRIP1, CDC73, CDH1, CDK4, CDKN1B, CDKN2A, CHEK2, CTNNA1, DICER1, FANCC, FH, FLCN, GALNT12, KIF1B, LZTR1, MAX, MEN1, MET, MLH1, MSH2, MSH3, MSH6, MUTYH, NBN, NF1, NF2, NTHL1, PALB2, PHOX2B, PMS2, POT1, PRKAR1A, PTCH1, PTEN, RAD51C, RAD51D, RB1, RECQL, RET, SDHA, SDHAF2, SDHB, SDHC, SDHD, SMAD4, SMARCA4, SMARCB1, SMARCE1, STK11, SUFU, TMEM127, TP53, TSC1, TSC2, VHL and XRCC2 (sequencing and deletion/duplication); EGFR, EGLN1, HOXB13, KIT, MITF, PDGFRA, POLD1 and POLE (sequencing only); EPCAM and GREM1 (deletion/duplication only). RNA data is routinely analyzed for use in variant interpretation for all genes.    06/17/2020 Cancer Staging   Staging form: Breast, AJCC 8th Edition - Pathologic stage from 06/17/2020: Stage Unknown (pT65mi, pNX, cM0, G2, ER+, PR+, HER2: Not Assessed) - Signed by Malachy Mood, MD on 07/26/2020 Stage prefix: Initial diagnosis Histologic grading system: 3 grade system Residual tumor (R): R0 - None   06/17/2020 Surgery   LEFT BREAST LUMPECTOMY WITH RADIOACTIVE SEED LOCALIZATION by Dr Carolynne Edouard   06/17/2020 Pathology Results   FINAL MICROSCOPIC DIAGNOSIS:   A. BREAST, LEFT, LUMPECTOMY:  -  Focus of microinvasive carcinoma (<1 mm) arising in association with  high-grade ductal carcinoma in situ, see comment  - Resection margins are negative; the superior margin is 0.2 cm from  focus of microinvasive carcinoma and DCIS  - Biopsy site changes  - Fibrocystic change with calcifications  - See oncology table      CURRENT THERAPY: Tamoxifen 20 mg daily, starting 07/2020  INTERVAL HISTORY Ms. Dewing returns for follow up as scheduled. Last seen by me 05/24/22. DEXA 08/23/22 showed mild osteopenia. She continues tamoxifen.   ROS   Past Medical History:  Diagnosis Date   Abnormal EKG    Asthma    Carotid artery occlusion    DDD (degenerative disc disease)    Diabetes mellitus without complication (HCC)    Family history of breast cancer    Family history of GI tract cancer    Family history of prostate cancer    Hyperlipidemia    Hypertension    PAD (peripheral artery disease) (HCC)    Right lower quadrant abdominal abscess (HCC)    SBO (small bowel obstruction) (HCC) 03/15/2014   Spinal stenosis    Type II or unspecified type diabetes mellitus without mention of complication, not stated as uncontrolled 03/22/2013   Vitamin D deficiency      Past Surgical History:  Procedure Laterality Date   BREAST LUMPECTOMY Left 06/17/2020   BREAST LUMPECTOMY WITH RADIOACTIVE SEED LOCALIZATION Left 06/17/2020   Procedure: LEFT BREAST LUMPECTOMY WITH RADIOACTIVE SEED LOCALIZATION;  Surgeon: Griselda Miner, MD;  Location: [facility];  Service: General;  Laterality: Left;   CHOLECYSTECTOMY     CYST EXCISION  back   ERCP N/A 10/09/2021   Procedure: ENDOSCOPIC RETROGRADE CHOLANGIOPANCREATOGRAPHY (ERCP);  Surgeon: Meryl Dare, MD;  Location: [facility];  Service: Gastroenterology;  Laterality: N/A;   HERNIA REPAIR     LAPAROSCOPIC APPENDECTOMY N/A 11/15/2014   Procedure: APPENDECTOMY LAPAROSCOPIC;  Surgeon: Karie Soda, MD;  Location: [facility];  Service: General;   Laterality: N/A;   MINI-LAPAROTOMY W/ TUBAL LIGATION  03/21/1962   REMOVAL OF STONES  10/09/2021   Procedure: REMOVAL OF STONES;  Surgeon: Meryl Dare, MD;  Location: [facility];  Service: Gastroenterology;;   Dennison Mascot  10/09/2021   Procedure: SPHINCTEROTOMY;  Surgeon: Meryl Dare, MD;  Location: [facility];  Service: Gastroenterology;;   TUBAL LIGATION       Outpatient Encounter Medications as of 11/22/2022  Medication Sig Note   ACCU-CHEK GUIDE test strip     acetaminophen (TYLENOL) 650 MG CR tablet Take 650 mg by mouth See admin instructions. Take 650 mg by mouth in the morning & evening and an additional 650 mg during the day as needed for pain    amLODipine (NORVASC) 10 MG tablet Take 10 mg by mouth daily.    atenolol (TENORMIN) 100 MG tablet TAKE ONE TABLET BY MOUTH DAILY *PLEASE SCHEDULE F/U APPOINTMENT TO OBTAIN FURTHER REFILLS* (Patient taking differently: Take 100 mg by mouth in the morning.)    Cholecalciferol (VITAMIN D-3) 1000 UNITS CAPS Take 2,000 Units by mouth daily.     cilostazol (PLETAL) 50 MG tablet Take 1 tablet (50 mg total) by mouth 2 (two) times daily.    Coenzyme Q10 200 MG capsule Take 200 mg by mouth daily.    Cyanocobalamin (VITAMIN B 12 PO) Take 500 mcg by mouth daily.    diltiazem (CARDIZEM) 120 MG tablet TAKE ONE TABLET BY MOUTH DAILY (Patient taking differently: Take 120 mg by  mouth in the morning.)    Flaxseed, Linseed, (FLAXSEED OIL) 1000 MG CAPS Take 1,000 mg by mouth 4 (four) times daily.    Lancets (FREESTYLE) lancets Test glucose 1 time daily    Magnesium 500 MG TABS Take 500 mg by mouth every evening.    metroNIDAZOLE (FLAGYL) 500 MG tablet Take 500 mg by mouth 2 (two) times daily.    mirabegron ER (MYRBETRIQ) 25 MG TB24 tablet Take 25 mg by mouth daily. (Patient not taking: Reported on 06/30/2022) 10/06/2021: Patient is unsure if she takes this   Multiple Vitamin (MULTIVITAMIN ADULT PO) Take 1 tablet by mouth daily at 6 (six) AM.     tamoxifen (NOLVADEX) 20 MG tablet TAKE ONE TABLET BY MOUTH DAILY    valsartan (DIOVAN) 160 MG tablet Take 160 mg by mouth daily.    valsartan-hydrochlorothiazide (DIOVAN-HCT) 160-25 MG tablet TAKE ONE TABLET BY MOUTH DAILY (Patient taking differently: Take 1 tablet by mouth daily.)    vitamin C (ASCORBIC ACID) 250 MG tablet Take 250 mg by mouth daily.    No facility-administered encounter medications on file as of 11/22/2022.     There were no vitals filed for this visit. There is no height or weight on file to calculate BMI.   PHYSICAL EXAM GENERAL:alert, no distress and comfortable SKIN: no rash  EYES: sclera clear NECK: without mass LYMPH:  no palpable cervical or supraclavicular lymphadenopathy  LUNGS: clear with normal breathing effort HEART: regular rate & rhythm, no lower extremity edema ABDOMEN: abdomen soft, non-tender and normal bowel sounds NEURO: alert & oriented x 3 with fluent speech, no focal motor/sensory deficits Breast exam:  PAC without erythema    CBC    Component Value Date/Time   WBC 4.0 05/24/2022 0952   WBC 10.0 10/11/2021 0358   RBC 3.92 05/24/2022 0952   HGB 12.5 05/24/2022 0952   HCT 36.3 05/24/2022 0952   PLT 160 05/24/2022 0952   MCV 92.6 05/24/2022 0952   MCH 31.9 05/24/2022 0952   MCHC 34.4 05/24/2022 0952   RDW 14.5 05/24/2022 0952   LYMPHSABS 1.6 05/24/2022 0952   MONOABS 0.5 05/24/2022 0952   EOSABS 0.1 05/24/2022 0952   BASOSABS 0.0 05/24/2022 0952     CMP     Component Value Date/Time   NA 138 05/24/2022 0952   K 4.7 05/24/2022 0952   CL 106 05/24/2022 0952   CO2 24 05/24/2022 0952   GLUCOSE 121 (H) 05/24/2022 0952   BUN 26 (H) 05/24/2022 0952   CREATININE 1.06 (H) 05/24/2022 0952   CREATININE 0.96 03/12/2014 1635   CALCIUM 8.9 05/24/2022 0952   PROT 7.9 05/24/2022 0952   ALBUMIN 3.6 05/24/2022 0952   AST 28 05/24/2022 0952   ALT 13 05/24/2022 0952   ALKPHOS 44 05/24/2022 0952   BILITOT 0.3 05/24/2022 0952   GFRNONAA 50  (L) 05/24/2022 0952   GFRNONAA 55 (L) 03/12/2014 1635   GFRAA >60 01/01/2018 0521   GFRAA 64 03/12/2014 1635     ASSESSMENT & PLAN:Shavonda V Ludlum is a 89 y.o. female with    1. Malignant neoplasm of upper-inner quadrant of left breast, DCIS with microinvasion, pT24miN0, stage IA, high grade, ER+/PR+ -Diagnosis in 04/2020 with 1.1cm mass in left breast and biopsy showed high grade DCIS.  -S/p left lumpectomy on 06/17/20. Surgical path showed <14mm of microinvasive carcinoma, otherwise showed high grade DICS and clear margins.  -no radiation recommended given her age and only microinvasion -She started tamoxifen in 07/2020 and is  tolerating very well, no noticeable side effects.  Will continue for total of 5 years -Mammogram 04/27/2022 was benign.     2. Osteopenia  -Her 12/2017 DEXA showed osteopenia with lowest T-score -2 at [facility] spine.  -She is on Tamoxifen, which can help strengthen her bones.  - DEXA scheduled 08/23/2022 showed mild osteopenia    3. Comorbidities: Spinal stenosis, PAD, HTN, HLD, borderline DM -Her spinal stenosis requires she ambulate with walker. She does have occasional back pain. She has some right hip pain as well.  -Her medications were recently altered by her PCP in April 2022 and instructed to change her diet.  -Admitted 7/19 - 24 /2023 for biliary obstruction and choledocholithiasis; s/p ERCP with removal.  Symptoms and labs have normalized.  Continue follow-up with GI Dr. Adela Lank -Follow-up with PCP and medical care team.    PLAN:  No orders of the defined types were placed in this encounter.     All questions were answered. The patient knows to call the clinic with any problems, questions or concerns. No barriers to learning were detected. I spent *** counseling the patient face to face. The total time spent in the appointment was *** and more than 50% was on counseling, review of test results, and coordination of care.   Santiago Glad, NP-C @DATE @

## 2022-11-22 ENCOUNTER — Inpatient Hospital Stay: Payer: Medicare HMO | Attending: Nurse Practitioner

## 2022-11-22 ENCOUNTER — Encounter: Payer: Self-pay | Admitting: Nurse Practitioner

## 2022-11-22 ENCOUNTER — Inpatient Hospital Stay (HOSPITAL_BASED_OUTPATIENT_CLINIC_OR_DEPARTMENT_OTHER): Payer: Medicare HMO | Admitting: Nurse Practitioner

## 2022-11-22 VITALS — BP 138/60 | HR 63 | Temp 98.3°F | Resp 16 | Ht 68.0 in | Wt 158.5 lb

## 2022-11-22 DIAGNOSIS — Z79810 Long term (current) use of selective estrogen receptor modulators (SERMs): Secondary | ICD-10-CM | POA: Insufficient documentation

## 2022-11-22 DIAGNOSIS — Z8042 Family history of malignant neoplasm of prostate: Secondary | ICD-10-CM | POA: Insufficient documentation

## 2022-11-22 DIAGNOSIS — E1151 Type 2 diabetes mellitus with diabetic peripheral angiopathy without gangrene: Secondary | ICD-10-CM | POA: Diagnosis not present

## 2022-11-22 DIAGNOSIS — M4800 Spinal stenosis, site unspecified: Secondary | ICD-10-CM | POA: Diagnosis not present

## 2022-11-22 DIAGNOSIS — Z170 Estrogen receptor positive status [ER+]: Secondary | ICD-10-CM

## 2022-11-22 DIAGNOSIS — Z803 Family history of malignant neoplasm of breast: Secondary | ICD-10-CM | POA: Insufficient documentation

## 2022-11-22 DIAGNOSIS — C50212 Malignant neoplasm of upper-inner quadrant of left female breast: Secondary | ICD-10-CM | POA: Diagnosis present

## 2022-11-22 DIAGNOSIS — I10 Essential (primary) hypertension: Secondary | ICD-10-CM | POA: Diagnosis not present

## 2022-11-22 DIAGNOSIS — Z800 Family history of malignant neoplasm of digestive organs: Secondary | ICD-10-CM | POA: Insufficient documentation

## 2022-11-22 DIAGNOSIS — M8588 Other specified disorders of bone density and structure, other site: Secondary | ICD-10-CM | POA: Insufficient documentation

## 2022-11-22 LAB — CBC WITH DIFFERENTIAL (CANCER CENTER ONLY)
Abs Immature Granulocytes: 0.01 10*3/uL (ref 0.00–0.07)
Basophils Absolute: 0 10*3/uL (ref 0.0–0.1)
Basophils Relative: 0 %
Eosinophils Absolute: 0.2 10*3/uL (ref 0.0–0.5)
Eosinophils Relative: 4 %
HCT: 36.7 % (ref 36.0–46.0)
Hemoglobin: 12.6 g/dL (ref 12.0–15.0)
Immature Granulocytes: 0 %
Lymphocytes Relative: 37 %
Lymphs Abs: 1.8 10*3/uL (ref 0.7–4.0)
MCH: 32.5 pg (ref 26.0–34.0)
MCHC: 34.3 g/dL (ref 30.0–36.0)
MCV: 94.6 fL (ref 80.0–100.0)
Monocytes Absolute: 0.6 10*3/uL (ref 0.1–1.0)
Monocytes Relative: 13 %
Neutro Abs: 2.2 10*3/uL (ref 1.7–7.7)
Neutrophils Relative %: 46 %
Platelet Count: 200 10*3/uL (ref 150–400)
RBC: 3.88 MIL/uL (ref 3.87–5.11)
RDW: 14.5 % (ref 11.5–15.5)
WBC Count: 4.8 10*3/uL (ref 4.0–10.5)
nRBC: 0 % (ref 0.0–0.2)

## 2022-11-22 LAB — CMP (CANCER CENTER ONLY)
ALT: 10 U/L (ref 0–44)
AST: 22 U/L (ref 15–41)
Albumin: 4.2 g/dL (ref 3.5–5.0)
Alkaline Phosphatase: 44 U/L (ref 38–126)
Anion gap: 8 (ref 5–15)
BUN: 25 mg/dL — ABNORMAL HIGH (ref 8–23)
CO2: 27 mmol/L (ref 22–32)
Calcium: 9.7 mg/dL (ref 8.9–10.3)
Chloride: 104 mmol/L (ref 98–111)
Creatinine: 1.06 mg/dL — ABNORMAL HIGH (ref 0.44–1.00)
GFR, Estimated: 50 mL/min — ABNORMAL LOW (ref >60)
Glucose, Bld: 144 mg/dL — ABNORMAL HIGH (ref 70–99)
Potassium: 4.5 mmol/L (ref 3.5–5.1)
Sodium: 139 mmol/L (ref 135–145)
Total Bilirubin: 0.4 mg/dL (ref 0.3–1.2)
Total Protein: 8.4 g/dL — ABNORMAL HIGH (ref 6.5–8.1)

## 2022-12-08 ENCOUNTER — Other Ambulatory Visit: Payer: Self-pay | Admitting: Hematology

## 2023-01-30 ENCOUNTER — Ambulatory Visit: Payer: Medicare HMO | Admitting: Podiatry

## 2023-02-06 ENCOUNTER — Ambulatory Visit: Payer: Medicare HMO | Admitting: Podiatry

## 2023-02-06 DIAGNOSIS — M79675 Pain in left toe(s): Secondary | ICD-10-CM

## 2023-02-06 DIAGNOSIS — B351 Tinea unguium: Secondary | ICD-10-CM

## 2023-02-06 DIAGNOSIS — M79674 Pain in right toe(s): Secondary | ICD-10-CM

## 2023-02-06 DIAGNOSIS — I739 Peripheral vascular disease, unspecified: Secondary | ICD-10-CM

## 2023-02-06 NOTE — Progress Notes (Signed)
Subjective: No chief complaint on file.  89 y.o. returns the office today for painful, elongated, thickened toenails which she cannot trim herself.  Denies any redness or drainage around the nails. No other concerns today.   PCP: Andi Devon, MD  Last A1c was 6.7 on 10/11/2021  Objective: AAO 3, NAD DP/PT pulses 1/4, CRT less than 3 seconds Nails hypertrophic, dystrophic, elongated, brittle, discolored  10. There is tenderness overlying the nails 1-5 bilaterally. There is no surrounding erythema or drainage along the nail sites. No open lesions or pre-ulcerative lesions are identified. No pain with calf compression, swelling, warmth, erythema.  Assessment: Patient presents with symptomatic onychomycosis, hyperkeratotic lesion, PAD  Plan: -Treatment options including alternatives, risks, complications were discussed -Nails sharply debrided 10 without complication/bleeding. -Previous ABI noted 03/29/2021.  Moderate arterial disease on the right side and mild on the left.  Unchanged.  Wants her circulation.  She has no symptoms currently. -Discussed daily foot inspection. If there are any changes, to call the office immediately.  -Follow-up in 3 months or sooner if any problems are to arise. In the meantime, encouraged to call the office with any questions, concerns, changes symptoms.  Return in about 3 months (around 05/09/2023).   Ovid Curd, DPM

## 2023-05-01 ENCOUNTER — Ambulatory Visit
Admission: RE | Admit: 2023-05-01 | Discharge: 2023-05-01 | Disposition: A | Discharge status: Home / Self Care | Payer: Medicare HMO | Source: Ambulatory Visit | Attending: Nurse Practitioner | Admitting: Nurse Practitioner

## 2023-05-01 ENCOUNTER — Other Ambulatory Visit: Payer: Self-pay | Admitting: Nurse Practitioner

## 2023-05-01 ENCOUNTER — Ambulatory Visit
Admission: RE | Admit: 2023-05-01 | Discharge: 2023-05-01 | Disposition: A | Discharge status: Home / Self Care | Payer: Medicare HMO | Source: Ambulatory Visit | Attending: Nurse Practitioner

## 2023-05-01 DIAGNOSIS — N6320 Unspecified lump in the left breast, unspecified quadrant: Secondary | ICD-10-CM

## 2023-05-01 DIAGNOSIS — Z170 Estrogen receptor positive status [ER+]: Secondary | ICD-10-CM

## 2023-05-09 ENCOUNTER — Ambulatory Visit: Payer: Medicare HMO | Admitting: Podiatry

## 2023-05-09 ENCOUNTER — Encounter: Payer: Self-pay | Admitting: Podiatry

## 2023-05-09 DIAGNOSIS — M79675 Pain in left toe(s): Secondary | ICD-10-CM

## 2023-05-09 DIAGNOSIS — B351 Tinea unguium: Secondary | ICD-10-CM

## 2023-05-09 DIAGNOSIS — M79674 Pain in right toe(s): Secondary | ICD-10-CM

## 2023-05-09 NOTE — Progress Notes (Signed)
Subjective: Chief Complaint  Patient presents with   RFC    RM#13 RFC patient has no concerns at this time.    89 y.o. returns the office today for painful, elongated, thickened toenails which she cannot trim herself.  Denies any redness or drainage around the nails. No open lesions. No new conccerns.   PCP: Andi Devon, MD  Last A1c was 6.7 on 10/11/2021  Objective: AAO 3, NAD DP/PT pulses 1/4, CRT less than 3 seconds Nails hypertrophic, dystrophic, elongated, brittle, discolored  10. There is tenderness overlying the nails 1-5 bilaterally. There is no surrounding erythema or drainage along the nail sites. No open lesions or pre-ulcerative lesions are identified. No pain with calf compression, swelling, warmth, erythema.  Assessment: Patient presents with symptomatic onychomycosis, hyperkeratotic lesion, PAD  Plan: -Treatment options including alternatives, risks, complications were discussed -Nails sharply debrided 10 without complication/bleeding. -Previous ABI noted 03/29/2021.  Moderate arterial disease on the right side and mild on the left.  Unchanged, currently asymptomatic without any ulcerations.  Continue to monitor. -Discussed daily foot inspection. If there are any changes, to call the office immediately.  -Follow-up in 3 months or sooner if any problems are to arise. In the meantime, encouraged to call the office with any questions, concerns, changes symptoms.  Return in about 3 months (around 08/06/2023).  Ovid Curd, DPM

## 2023-05-21 NOTE — Progress Notes (Unsigned)
 Patient Care Team: Andi Devon, MD as PCP - General (Internal Medicine) Elise Benne, MD as Consulting Physician (Ophthalmology) Donnelly Angelica, RN as Oncology Nurse Navigator Pershing Proud, RN as Oncology Nurse Navigator Griselda Miner, MD as Consulting Physician (General Surgery) Malachy Mood, MD as Consulting Physician (Hematology) Armbruster, Willaim Rayas, MD as Consulting Physician (Gastroenterology)   CHIEF COMPLAINT: Follow up left breast cancer   Oncology History Overview Note  Cancer Staging Ductal carcinoma in situ (DCIS) of left breast Staging form: Breast, AJCC 8th Edition - Clinical stage from 05/13/2020: Stage 0 (cTis (DCIS), cN0, cM0, G3, ER+, PR+, HER2: Not Assessed) - Signed by Malachy Mood, MD on 05/19/2020 Stage prefix: Initial diagnosis    Malignant neoplasm of upper-inner quadrant of left breast in female, estrogen receptor positive (HCC)  05/13/2020 Cancer Staging   Staging form: Breast, AJCC 8th Edition - Clinical stage from 05/13/2020: Stage 0 (cTis (DCIS), cN0, cM0, G3, ER+, PR+, HER2: Not Assessed) - Signed by Malachy Mood, MD on 05/19/2020 Stage prefix: Initial diagnosis   05/13/2020 Mammogram    IMPRESSION:  1.1 x 1.0 x 0.9 cm mass in the 9:30 o'clock position of the left breast 5 cm from the nipple with imaging features suspicious for malignancy.    05/13/2020 Initial Biopsy   Diagnosis Breast, left, needle core biopsy, 9:30 O'CLOCK left breast AMENDED DIAGNOSIS: - HIGH GRADE DUCTAL CARCINOMA IN SITU WITH CENTRAL NECROSIS AND CALCIFICATIONS. - FOCUS SUSPICIOUS FOR MICROINVASION. Microscopic Comment E-cadherin is positive supporting a ductal phenotype. In performing E-cadherin, subsequent leveling revealed a focus suspicious for microinvasion. Ancillary studies will be reported separately. Revised results reported to [facility] on 05/15/2020. Dr. Berneice Heinrich reviewed.   05/13/2020 Receptors her2   Results: IMMUNOHISTOCHEMICAL AND  MORPHOMETRIC ANALYSIS PERFORMED MANUALLY Estrogen Receptor: 95%, POSITIVE, STRONG STAINING INTENSITY Progesterone Receptor: 95%, POSITIVE, STRONG STAINING INTENSITY   05/18/2020 Initial Diagnosis   Ductal carcinoma in situ (DCIS) of left breast   06/10/2020 Genetic Testing   Negative genetic testing:  No pathogenic variants detected on the [employer] CancerNext-Expanded + RNAinsight panel. Two variants of uncertain significance (VUS) were detected - one in the BRCA1 gene called c.4735C>G (p.P1579A) and a second in the BRCA2 gene called c.1741T>C (p.S581P). The report date is 06/10/2020.  The CancerNext-Expanded + RNAinsight gene panel offered by [employer] and includes sequencing and rearrangement analysis for the following 77 genes: AIP, ALK, APC, ATM, AXIN2, BAP1, BARD1, BLM, BMPR1A, BRCA1, BRCA2, BRIP1, CDC73, CDH1, CDK4, CDKN1B, CDKN2A, CHEK2, CTNNA1, DICER1, FANCC, FH, FLCN, GALNT12, KIF1B, LZTR1, MAX, MEN1, MET, MLH1, MSH2, MSH3, MSH6, MUTYH, NBN, NF1, NF2, NTHL1, PALB2, PHOX2B, PMS2, POT1, PRKAR1A, PTCH1, PTEN, RAD51C, RAD51D, RB1, RECQL, RET, SDHA, SDHAF2, SDHB, SDHC, SDHD, SMAD4, SMARCA4, SMARCB1, SMARCE1, STK11, SUFU, TMEM127, TP53, TSC1, TSC2, VHL and XRCC2 (sequencing and deletion/duplication); EGFR, EGLN1, HOXB13, KIT, MITF, PDGFRA, POLD1 and POLE (sequencing only); EPCAM and GREM1 (deletion/duplication only). RNA data is routinely analyzed for use in variant interpretation for all genes.    06/17/2020 Cancer Staging   Staging form: Breast, AJCC 8th Edition - Pathologic stage from 06/17/2020: Stage Unknown (pT4mi, pNX, cM0, G2, ER+, PR+, HER2: Not Assessed) - Signed by Malachy Mood, MD on 07/26/2020 Stage prefix: Initial diagnosis Histologic grading system: 3 grade system Residual tumor (R): R0 - None   06/17/2020 Surgery   LEFT BREAST LUMPECTOMY WITH RADIOACTIVE SEED LOCALIZATION by Dr Carolynne Edouard   06/17/2020 Pathology Results   FINAL MICROSCOPIC DIAGNOSIS:   A. BREAST, LEFT, LUMPECTOMY:  -  Focus of microinvasive carcinoma (<1 mm) arising in association with  high-grade ductal carcinoma in situ, see comment  - Resection margins are negative; the superior margin is 0.2 cm from  focus of microinvasive carcinoma and DCIS  - Biopsy site changes  - Fibrocystic change with calcifications  - See oncology table      CURRENT THERAPY: Tamoxifen 20 mg, starting 07/2020  INTERVAL HISTORY Kristine Burnett returns for follow up and treatment as scheduled, last seen by me 11/22/22.  Doing well overall with no changes in her health.  She is tolerating tamoxifen with no side effects such as hot flashes, joint pain, bleeding, mood fluctuations.  Denies concerns or changes in her breast such as new lump/mass, nipple discharge or inversion, or skin change.     ROS  All other systems reviewed and negative  Past Medical History:  Diagnosis Date   Abnormal EKG    Asthma    Carotid artery occlusion    DDD (degenerative disc disease)    Diabetes mellitus without complication (HCC)    Family history of breast cancer    Family history of GI tract cancer    Family history of prostate cancer    Hyperlipidemia    Hypertension    PAD (peripheral artery disease) (HCC)    Right lower quadrant abdominal abscess (HCC)    SBO (small bowel obstruction) (HCC) 03/15/2014   Spinal stenosis    Type II or unspecified type diabetes mellitus without mention of complication, not stated as uncontrolled 03/22/2013   Vitamin D deficiency      Past Surgical History:  Procedure Laterality Date   BREAST LUMPECTOMY Left 06/17/2020   BREAST LUMPECTOMY WITH RADIOACTIVE SEED LOCALIZATION Left 06/17/2020   Procedure: LEFT BREAST LUMPECTOMY WITH RADIOACTIVE SEED LOCALIZATION;  Surgeon: Griselda Miner, MD;  Location:  SURGERY CENTER;  Service: General;  Laterality: Left;   CHOLECYSTECTOMY     CYST EXCISION  back   ERCP N/A 10/09/2021   Procedure: ENDOSCOPIC RETROGRADE CHOLANGIOPANCREATOGRAPHY (ERCP);  Surgeon:  Meryl Dare, MD;  Location: [facility];  Service: Gastroenterology;  Laterality: N/A;   HERNIA REPAIR     LAPAROSCOPIC APPENDECTOMY N/A 11/15/2014   Procedure: APPENDECTOMY LAPAROSCOPIC;  Surgeon: Karie Soda, MD;  Location: [facility];  Service: General;  Laterality: N/A;   MINI-LAPAROTOMY W/ TUBAL LIGATION  03/21/1962   REMOVAL OF STONES  10/09/2021   Procedure: REMOVAL OF STONES;  Surgeon: Meryl Dare, MD;  Location: [facility];  Service: Gastroenterology;;   Dennison Mascot  10/09/2021   Procedure: SPHINCTEROTOMY;  Surgeon: Meryl Dare, MD;  Location: [facility];  Service: Gastroenterology;;   TUBAL LIGATION       Outpatient Encounter Medications as of 05/23/2023  Medication Sig Note   ACCU-CHEK GUIDE test strip     acetaminophen (TYLENOL) 650 MG CR tablet Take 650 mg by mouth See admin instructions. Take 650 mg by mouth in the morning & evening and an additional 650 mg during the day as needed for pain    amLODipine (NORVASC) 10 MG tablet Take 10 mg by mouth daily.    atenolol (TENORMIN) 100 MG tablet TAKE ONE TABLET BY MOUTH DAILY *PLEASE SCHEDULE F/U APPOINTMENT TO OBTAIN FURTHER REFILLS* (Patient taking differently: Take 100 mg by mouth in the morning.)    Cholecalciferol (VITAMIN D-3) 1000 UNITS CAPS Take 2,000 Units by mouth daily.     cilostazol (PLETAL) 50 MG tablet Take 1 tablet (50 mg total) by mouth 2 (two) times daily.  Coenzyme Q10 200 MG capsule Take 200 mg by mouth daily.    Cyanocobalamin (VITAMIN B 12 PO) Take 500 mcg by mouth daily.    diltiazem (CARDIZEM) 120 MG tablet TAKE ONE TABLET BY MOUTH DAILY (Patient taking differently: Take 120 mg by mouth in the morning.)    Flaxseed, Linseed, (FLAXSEED OIL) 1000 MG CAPS Take 1,000 mg by mouth 4 (four) times daily.    Lancets (FREESTYLE) lancets Test glucose 1 time daily    Magnesium 500 MG TABS Take 500 mg by mouth every evening.    mirabegron ER (MYRBETRIQ) 25 MG TB24 tablet Take 25 mg by mouth daily.  10/06/2021: Patient is unsure if she takes this   Multiple Vitamin (MULTIVITAMIN ADULT PO) Take 1 tablet by mouth daily at 6 (six) AM.    tamoxifen (NOLVADEX) 20 MG tablet Take 1 tablet (20 mg total) by mouth daily.    valsartan-hydrochlorothiazide (DIOVAN-HCT) 160-25 MG tablet TAKE ONE TABLET BY MOUTH DAILY (Patient taking differently: Take 1 tablet by mouth daily.)    vitamin C (ASCORBIC ACID) 250 MG tablet Take 250 mg by mouth daily.    [DISCONTINUED] metroNIDAZOLE (FLAGYL) 500 MG tablet Take 500 mg by mouth 2 (two) times daily. (Patient not taking: Reported on 05/23/2023) 05/23/2023: Patient reports she only took this for UTI a year ago   [DISCONTINUED] tamoxifen (NOLVADEX) 20 MG tablet TAKE 1 TABLET BY MOUTH DAILY    [DISCONTINUED] valsartan (DIOVAN) 160 MG tablet Take 160 mg by mouth daily. (Patient not taking: Reported on 05/23/2023) 05/23/2023: Patient reports taking new version of med    No facility-administered encounter medications on file as of 05/23/2023.     Today's Vitals   05/23/23 0831 05/23/23 0845  BP: 136/70   Pulse: 61   Resp: 15   Temp: 97.7 F (36.5 C)   TempSrc: Temporal   SpO2: 99%   Weight: 167 lb 3.2 oz (75.8 kg)   PainSc:  0-No pain   Body mass index is 25.42 kg/m.   PHYSICAL EXAM GENERAL:alert, no distress and comfortable SKIN: no rash  EYES: sclera clear NECK: without mass LYMPH:  no palpable cervical or supraclavicular lymphadenopathy  LUNGS:  normal breathing effort HEART: no lower extremity edema ABDOMEN: abdomen soft, non-tender  NEURO: alert & oriented x 3 with fluent speech, no focal motor/sensory deficits Breast exam: No nipple discharge or inversion.  S/p left lumpectomy, incisions completely healed with mild scar tissue.  No palpable mass or nodularity in either breast or axilla that I could appreciate   CBC    Component Value Date/Time   WBC 4.3 05/23/2023 0807   WBC 10.0 10/11/2021 0358   RBC 3.83 (L) 05/23/2023 0807   HGB 12.2 05/23/2023  0807   HCT 36.5 05/23/2023 0807   PLT 224 05/23/2023 0807   MCV 95.3 05/23/2023 0807   MCH 31.9 05/23/2023 0807   MCHC 33.4 05/23/2023 0807   RDW 14.7 05/23/2023 0807   LYMPHSABS 1.4 05/23/2023 0807   MONOABS 0.6 05/23/2023 0807   EOSABS 0.1 05/23/2023 0807   BASOSABS 0.0 05/23/2023 0807     CMP     Component Value Date/Time   NA 139 05/23/2023 0807   K 4.2 05/23/2023 0807   CL 104 05/23/2023 0807   CO2 29 05/23/2023 0807   GLUCOSE 151 (H) 05/23/2023 0807   BUN 24 (H) 05/23/2023 0807   CREATININE 1.00 05/23/2023 0807   CREATININE 0.96 03/12/2014 1635   CALCIUM 9.2 05/23/2023 0807   PROT 8.0  05/23/2023 0807   ALBUMIN 3.9 05/23/2023 0807   AST 22 05/23/2023 0807   ALT 10 05/23/2023 0807   ALKPHOS 42 05/23/2023 0807   BILITOT 0.4 05/23/2023 0807   GFRNONAA 53 (L) 05/23/2023 0807   GFRNONAA 55 (L) 03/12/2014 1635   GFRAA >60 01/01/2018 0521   GFRAA 64 03/12/2014 1635     ASSESSMENT & PLAN:Kristine Burnett is a 89 y.o. female with    1. Malignant neoplasm of upper-inner quadrant of left breast, DCIS with microinvasion, pT67miN0, stage IA, high grade, ER+/PR+ -Diagnosis in 04/2020 with 1.1cm mass in left breast and biopsy showed high grade DCIS.  -S/p left lumpectomy on 06/17/20. Surgical path showed <75mm of microinvasive carcinoma, otherwise showed high grade DICS and clear margins.  -no radiation recommended given her age and only microinvasion -She started tamoxifen in 07/2020 and is tolerating very well, no noticeable side effects.  Will continue for total of 5 years -Mammogram 05/01/2023 and closer look L [place] were benign.  -Kristine Burnett is clinically doing well.  Tolerating tamoxifen with no significant side effects.  Exam is benign, labs are unremarkable.  Overall no clinical concern for recurrence -She is 3 years from diagnosis and definitive treatment, the recurrence risk has decreased. -She was offered 92-month or annual follow-up and opted to return in 1 year, she knows to call  sooner with any concerning changes in the interim   2. Osteopenia  -Her 12/2017 DEXA showed osteopenia with lowest T-score -2 at [facility].  -She is on Tamoxifen, which can help strengthen her bones.  - DEXA 08/23/2022 showed mild osteopenia    3. Comorbidities: Spinal stenosis, PAD, HTN, HLD, borderline DM -Her spinal stenosis requires she ambulate with walker. She does have occasional back, hip, and leg pain.  Denies pain today -GI Dr. Adela Lank -Follow-up with PCP and medical care team.      PLAN: -Recent mammo/ultrasound and today's labs reviewed -Continue breast cancer surveillance and tamoxifen, refilled -Follow-up in 1 year, or sooner if needed  Orders Placed This Encounter  Procedures   MM DIAG BREAST TOMO BILATERAL    Standing Status:   Future    Expected Date:   05/01/2024    Expiration Date:   05/22/2024    Reason for Exam (SYMPTOM  OR DIAGNOSIS REQUIRED):   History of left breast cancer    [facility]erred [facility]?:   [facility]      All questions were answered. The patient knows to call the clinic with any problems, questions or concerns. No barriers to learning were detected.  Santiago Glad, NP-C 05/23/2023

## 2023-05-23 ENCOUNTER — Encounter: Payer: Self-pay | Admitting: Nurse Practitioner

## 2023-05-23 ENCOUNTER — Inpatient Hospital Stay: Payer: Medicare HMO | Attending: Nurse Practitioner

## 2023-05-23 ENCOUNTER — Inpatient Hospital Stay: Payer: Medicare HMO | Admitting: Nurse Practitioner

## 2023-05-23 VITALS — BP 136/70 | HR 61 | Temp 97.7°F | Resp 15 | Wt 167.2 lb

## 2023-05-23 DIAGNOSIS — Z8042 Family history of malignant neoplasm of prostate: Secondary | ICD-10-CM | POA: Diagnosis not present

## 2023-05-23 DIAGNOSIS — Z170 Estrogen receptor positive status [ER+]: Secondary | ICD-10-CM

## 2023-05-23 DIAGNOSIS — C50212 Malignant neoplasm of upper-inner quadrant of left female breast: Secondary | ICD-10-CM | POA: Diagnosis present

## 2023-05-23 DIAGNOSIS — Z803 Family history of malignant neoplasm of breast: Secondary | ICD-10-CM | POA: Diagnosis not present

## 2023-05-23 DIAGNOSIS — M8588 Other specified disorders of bone density and structure, other site: Secondary | ICD-10-CM | POA: Diagnosis not present

## 2023-05-23 DIAGNOSIS — Z1721 Progesterone receptor positive status: Secondary | ICD-10-CM | POA: Insufficient documentation

## 2023-05-23 DIAGNOSIS — Z800 Family history of malignant neoplasm of digestive organs: Secondary | ICD-10-CM | POA: Diagnosis not present

## 2023-05-23 DIAGNOSIS — Z79810 Long term (current) use of selective estrogen receptor modulators (SERMs): Secondary | ICD-10-CM | POA: Diagnosis not present

## 2023-05-23 LAB — CMP (CANCER CENTER ONLY)
ALT: 10 U/L (ref 0–44)
AST: 22 U/L (ref 15–41)
Albumin: 3.9 g/dL (ref 3.5–5.0)
Alkaline Phosphatase: 42 U/L (ref 38–126)
Anion gap: 6 (ref 5–15)
BUN: 24 mg/dL — ABNORMAL HIGH (ref 8–23)
CO2: 29 mmol/L (ref 22–32)
Calcium: 9.2 mg/dL (ref 8.9–10.3)
Chloride: 104 mmol/L (ref 98–111)
Creatinine: 1 mg/dL (ref 0.44–1.00)
GFR, Estimated: 53 mL/min — ABNORMAL LOW (ref >60)
Glucose, Bld: 151 mg/dL — ABNORMAL HIGH (ref 70–99)
Potassium: 4.2 mmol/L (ref 3.5–5.1)
Sodium: 139 mmol/L (ref 135–145)
Total Bilirubin: 0.4 mg/dL (ref 0.0–1.2)
Total Protein: 8 g/dL (ref 6.5–8.1)

## 2023-05-23 LAB — CBC WITH DIFFERENTIAL (CANCER CENTER ONLY)
Abs Immature Granulocytes: 0.01 10*3/uL (ref 0.00–0.07)
Basophils Absolute: 0 10*3/uL (ref 0.0–0.1)
Basophils Relative: 1 %
Eosinophils Absolute: 0.1 10*3/uL (ref 0.0–0.5)
Eosinophils Relative: 3 %
HCT: 36.5 % (ref 36.0–46.0)
Hemoglobin: 12.2 g/dL (ref 12.0–15.0)
Immature Granulocytes: 0 %
Lymphocytes Relative: 32 %
Lymphs Abs: 1.4 10*3/uL (ref 0.7–4.0)
MCH: 31.9 pg (ref 26.0–34.0)
MCHC: 33.4 g/dL (ref 30.0–36.0)
MCV: 95.3 fL (ref 80.0–100.0)
Monocytes Absolute: 0.6 10*3/uL (ref 0.1–1.0)
Monocytes Relative: 13 %
Neutro Abs: 2.2 10*3/uL (ref 1.7–7.7)
Neutrophils Relative %: 51 %
Platelet Count: 224 10*3/uL (ref 150–400)
RBC: 3.83 MIL/uL — ABNORMAL LOW (ref 3.87–5.11)
RDW: 14.7 % (ref 11.5–15.5)
WBC Count: 4.3 10*3/uL (ref 4.0–10.5)
nRBC: 0 % (ref 0.0–0.2)

## 2023-05-23 MED ORDER — TAMOXIFEN CITRATE 20 MG PO TABS: 20.0000 mg | ORAL_TABLET | Freq: Every day | ORAL | 3 refills | Status: AC

## 2023-08-08 ENCOUNTER — Ambulatory Visit: Payer: Medicare HMO | Admitting: Podiatry

## 2023-08-08 DIAGNOSIS — L853 Xerosis cutis: Secondary | ICD-10-CM

## 2023-08-08 DIAGNOSIS — M2042 Other hammer toe(s) (acquired), left foot: Secondary | ICD-10-CM

## 2023-08-08 DIAGNOSIS — M2041 Other hammer toe(s) (acquired), right foot: Secondary | ICD-10-CM | POA: Diagnosis not present

## 2023-08-08 DIAGNOSIS — B351 Tinea unguium: Secondary | ICD-10-CM

## 2023-08-08 DIAGNOSIS — M79674 Pain in right toe(s): Secondary | ICD-10-CM | POA: Diagnosis not present

## 2023-08-08 DIAGNOSIS — M79675 Pain in left toe(s): Secondary | ICD-10-CM | POA: Diagnosis not present

## 2023-08-08 NOTE — Patient Instructions (Signed)
   Choose a moisturizer from  the list below:  For normal skin: Moisturize feet once daily; do not apply between toes A.  CeraVe Daily Moisturizing Lotion B.  Lubriderm Advanced Therapy Lotion or Lubriderm Intense Skin Repair Lotion C.  Aquaphor Intensive Repair Lotion D.  Gold Bond Ultimate Diabetic Foot Lotion E.  Eucerin Intensive Repair Moisturizing Lotion  For extremely dry, cracked feet: moisturize feet once daily; do not apply between toes A. CeraVe Healing Ointment B. Eucerin Aquaphor Repairing Ointment (may be labeled Aquaphor Healing Ointment) C. Vaseline Petroleum Healing Jelly   If you have problems reaching your feet: apply to feet once daily; do not apply between toes A.  Eucerin Aquaphor Ointment Body Spray  B.  Vaseline Intensive Care Spray Moisturizer (Unscented,  Cocoa Radiant Spray or Aloe Smooth Spray)

## 2023-08-08 NOTE — Progress Notes (Signed)
 Subjective: Chief Complaint  Patient presents with   RFC    RM#11 RFC concerns of dry skin.  ]   89 y.o. returns the office today for painful, elongated, thickened toenails which she cannot trim herself.  Denies any redness or drainage around the nails.  She also has been to check her hammertoe with her right second toe as she is wearing shoes and felt it rubbing.  Denies any injuries or any current pain.  No open lesions or calluses she also has questions about dry skin.  No open lesions. No other conccerns.   PCP: Yolanda Hence, MD  Objective: AAO 3, NAD DP/PT pulses 1/4, CRT less than 3 seconds Nails hypertrophic, dystrophic, elongated, brittle, discolored  10. There is tenderness overlying the nails 1-5 bilaterally. There is no surrounding erythema or drainage along the nail sites. Rigid hammertoes are present most of the right second toe.  There is no pain associated with this today and there is no open lesions or any preulcerative calluses.  There is no edema, erythema. Dry skin present to the arch of the foot there is no skin break, ulcerations or skin fissures. No open lesions or pre-ulcerative lesions are identified. No pain with calf compression, swelling, warmth, erythema.  Assessment: Patient presents with symptomatic onychomycosis, hyperkeratotic lesion, PAD  Plan: Symptomatic onychomycosis -Nails sharply debrided 10 without complication/bleeding.  Hammertoe -Plan continue conservative management for now.  Discussed daily foot inspection, off loading.  Monitor any signs or symptoms of skin.  Xerosis - Mild.  Discussed moisturizers and a list was provided today.  Charity Conch DPM

## 2023-10-15 ENCOUNTER — Emergency Department (HOSPITAL_COMMUNITY)

## 2023-10-15 ENCOUNTER — Emergency Department (HOSPITAL_COMMUNITY)
Admission: EM | Admit: 2023-10-15 | Discharge: 2023-10-15 | Disposition: A | Discharge status: Home / Self Care | Attending: Emergency Medicine | Admitting: Emergency Medicine

## 2023-10-15 ENCOUNTER — Other Ambulatory Visit: Payer: Self-pay

## 2023-10-15 ENCOUNTER — Encounter (HOSPITAL_COMMUNITY): Payer: Self-pay | Admitting: Emergency Medicine

## 2023-10-15 DIAGNOSIS — M25512 Pain in left shoulder: Secondary | ICD-10-CM | POA: Diagnosis present

## 2023-10-15 MED ORDER — LIDOCAINE 5 % EX PTCH
1.0000 | MEDICATED_PATCH | CUTANEOUS | Status: DC
  Administered 2023-10-15: 1 via TRANSDERMAL
  Filled 2023-10-15: qty 1

## 2023-10-15 MED ORDER — ACETAMINOPHEN 325 MG PO TABS
650.0000 mg | ORAL_TABLET | Freq: Once | ORAL | Status: AC
  Administered 2023-10-15: 650 mg via ORAL
  Filled 2023-10-15: qty 2

## 2023-10-15 NOTE — ED Triage Notes (Signed)
 Patient coming to ED for evaluation of L shoulder pain x 4 days.  No reports of injury.  Reports pain is located in the ball and socket.  No reports of chest pain.

## 2023-10-15 NOTE — ED Provider Notes (Signed)
 [place] EMERGENCY DEPARTMENT AT [facility] Provider Note   CSN: [id number] Arrival date & time: 10/15/23  9366     Patient presents with: Shoulder Pain   Kristine Burnett is a 89 y.o. female.   This is a brightly 89 year old female who is here today for pain in her left shoulder that has been ongoing for the last 4 days.  Does not report any fall.  She sleeps on her side.  She has noticed that she has pain with moving her shoulder, feels as though she cannot range her shoulder as far as she usually does.  Is concerned about a dislocation.   Shoulder Pain      Prior to Admission medications   Medication Sig Start Date End Date Taking? Authorizing Provider  ACCU-CHEK GUIDE test strip  07/21/22   [provider]  acetaminophen  (TYLENOL ) 650 MG CR tablet Take 650 mg by mouth See admin instructions. Take 650 mg by mouth in the morning & evening and an additional 650 mg during the day as needed for pain    [provider]  amLODipine  (NORVASC ) 10 MG tablet Take 10 mg by mouth daily. 11/08/17   [provider]  atenolol  (TENORMIN ) 100 MG tablet TAKE ONE TABLET BY MOUTH DAILY *PLEASE SCHEDULE F/U APPOINTMENT TO OBTAIN FURTHER REFILLS* Patient taking differently: Take 100 mg by mouth in the morning. 01/27/20   Patwardhan, Newman PARAS, MD  Cholecalciferol  (VITAMIN D -3) 1000 UNITS CAPS Take 2,000 Units by mouth daily.     [provider]  cilostazol  (PLETAL ) 50 MG tablet Take 1 tablet (50 mg total) by mouth 2 (two) times daily. 10/17/21   Maree, Pratik D, DO  Coenzyme Q10 200 MG capsule Take 200 mg by mouth daily.    [provider]  Cyanocobalamin  (VITAMIN B 12 PO) Take 500 mcg by mouth daily.    [provider]  diltiazem  (CARDIZEM ) 120 MG tablet TAKE ONE TABLET BY MOUTH DAILY Patient taking differently: Take 120 mg by mouth in the morning. 05/17/19   Patwardhan, Manish J, MD  Flaxseed, Linseed, (FLAXSEED OIL) 1000 MG CAPS Take 1,000  mg by mouth 4 (four) times daily.    [provider]  Lancets (FREESTYLE) lancets Test glucose 1 time daily 08/16/13   Tonita Fallow, MD  Magnesium  500 MG TABS Take 500 mg by mouth every evening.    [provider]  mirabegron ER (MYRBETRIQ) 25 MG TB24 tablet Take 25 mg by mouth daily.    [provider]  Multiple Vitamin (MULTIVITAMIN ADULT PO) Take 1 tablet by mouth daily at 6 (six) AM.    [provider]  tamoxifen  (NOLVADEX ) 20 MG tablet Take 1 tablet (20 mg total) by mouth daily. 05/23/23   Burton, Lacie K, NP  valsartan -hydrochlorothiazide  (DIOVAN -HCT) 160-25 MG tablet TAKE ONE TABLET BY MOUTH DAILY Patient taking differently: Take 1 tablet by mouth daily. 05/08/19   Patwardhan, Newman PARAS, MD  vitamin C  (ASCORBIC ACID ) 250 MG tablet Take 250 mg by mouth daily.    [provider]    Allergies: Ace inhibitors, Metformin  and related, Penicillins, Grapefruit extract, and Minoxidil    Review of Systems  Updated Vital Signs BP (!) 170/78   Pulse 83   Temp 98.1 F (36.7 C) (Oral)   Resp 18   Ht 5' 8 (1.727 m)   Wt 75.8 kg   SpO2 96%   BMI 25.39 kg/m   Physical Exam Vitals and nursing note reviewed.  Eyes:     Pupils: Pupils are equal, round, and reactive to light.  Cardiovascular:     Rate and Rhythm: Normal rate.     Pulses: Normal pulses.  Pulmonary:     Effort: Pulmonary effort is normal.  Abdominal:     General: Abdomen is flat. There is no distension.  Musculoskeletal:     Cervical back: Normal range of motion.     Comments: Negative drop arm test.  Patient with pain with passive and active rotation of the left shoulder.  No deformity.  Tenderness in the coracoid process.  Skin:    General: Skin is warm.  Neurological:     Mental Status: She is alert.     (all labs ordered are listed, but only abnormal results are displayed) Labs Reviewed - No data to display  EKG: None  Radiology: DG Shoulder Left Result Date:  10/15/2023 CLINICAL DATA:  Left shoulder pain for 4 days. EXAM: LEFT SHOULDER - 2+ VIEW COMPARISON:  No comparison studies available. FINDINGS: No evidence for an acute fracture. No shoulder separation or dislocation. Probable intra-articular loose body inferior to the glenoid. Calcific tendinitis of the rotator cuff evident. IMPRESSION: 1. Calcific tendinitis of the rotator cuff. 2. Probable intra-articular loose body inferior to the glenoid. Electronically Signed   By: Camellia Candle M.D.   On: 10/15/2023 07:57     Procedures   Medications Ordered in the ED  lidocaine  (LIDODERM ) 5 % 1 patch (has no administration in time range)  acetaminophen  (TYLENOL ) tablet 650 mg (has no administration in time range)                                    Medical Decision Making 89 year old female here today for shoulder pain.  Differential diagnoses include rotator cuff injury, less likely dislocation, arthritis, less likely fracture, malignancy.  Plan-plain films of the left shoulder ordered.  Unlikely to be referred pain.  Patient overall looks fantastic.  Reassessment 8 PM-patient's plain film shows calcific tendinitis of the rotator cuff as well as likely intra-articular loose body.  Likely causing her symptoms.  Advised patient to take Motrin, Tylenol  and will provide orthopedic follow-up.  Amount and/or Complexity of Data Reviewed Radiology: ordered.  Risk OTC drugs. Prescription drug management.        Final diagnoses:  Acute pain of left shoulder    ED Discharge Orders     None          Mannie Fairy DASEN, DO 10/15/23 [phone]

## 2023-10-15 NOTE — Discharge Instructions (Addendum)
 Your x-ray of your shoulder showed that you have tendinitis in your shoulder.  You also have what is called a loose body in your shoulder joint.  This happens when some of your cartilage gets loose and floats in the joint.  This is likely due to age.  The body should heal this on its own with time.  You can take Motrin and Tylenol  at home.  I have included a referral to an orthopedic doctor.  You can give them a call this week for a follow-up appointment.

## 2023-11-09 ENCOUNTER — Encounter: Payer: Self-pay | Admitting: Podiatry

## 2023-11-09 ENCOUNTER — Ambulatory Visit: Admitting: Podiatry

## 2023-11-09 DIAGNOSIS — M2042 Other hammer toe(s) (acquired), left foot: Secondary | ICD-10-CM

## 2023-11-09 DIAGNOSIS — M2041 Other hammer toe(s) (acquired), right foot: Secondary | ICD-10-CM

## 2023-11-09 DIAGNOSIS — I739 Peripheral vascular disease, unspecified: Secondary | ICD-10-CM | POA: Diagnosis not present

## 2023-11-09 DIAGNOSIS — B351 Tinea unguium: Secondary | ICD-10-CM | POA: Diagnosis not present

## 2023-11-09 DIAGNOSIS — M79674 Pain in right toe(s): Secondary | ICD-10-CM | POA: Diagnosis not present

## 2023-11-09 DIAGNOSIS — M79675 Pain in left toe(s): Secondary | ICD-10-CM | POA: Diagnosis not present

## 2023-11-09 NOTE — Progress Notes (Signed)
 Subjective: Chief Complaint  Patient presents with   RFC     RFC Non diabetic toenail trim. 0 pain at the present.    89 y.o. returns the office today for painful, elongated, thickened toenails which she cannot trim herself.  Denies any redness or drainage around the nails.    She states the only other issue she has is her right second hammertoe feels like it is digging in at times.  No open wounds or any injuries.  PCP: Theo Iha, MD  Objective: AAO 3, NAD DP/PT pulses 1/4, CRT less than 3 seconds Nails hypertrophic, dystrophic, elongated, brittle, discolored  10. There is tenderness overlying the nails 1-5 bilaterally. There is no surrounding erythema or drainage along the nail sites. Rigid hammertoes are present most of the right second toe.  There is no significant pain today there is no open lesions or preulcerative lesions. No open wounds/ulcerations at this time.  No open lesions or pre-ulcerative lesions are identified. No pain with calf compression, swelling, warmth, erythema.  Assessment: Patient presents with symptomatic onychomycosis, hyperkeratotic lesion, PAD; hammertoe  Plan: Symptomatic onychomycosis -Nails sharply debrided 10 without complication/bleeding.  Hammertoe -Plan continue conservative management for now.  Discussed daily foot inspection, off loading.  I dispensed a toe To offload. Monitor any signs or symptoms of skin.  Return in about 3 months (around 02/09/2024).  Donnice JONELLE Fees DPM

## 2024-02-09 ENCOUNTER — Ambulatory Visit: Admitting: Podiatry

## 2024-02-09 ENCOUNTER — Encounter: Payer: Self-pay | Admitting: Podiatry

## 2024-02-09 DIAGNOSIS — I739 Peripheral vascular disease, unspecified: Secondary | ICD-10-CM

## 2024-02-09 DIAGNOSIS — B351 Tinea unguium: Secondary | ICD-10-CM

## 2024-02-09 DIAGNOSIS — M79674 Pain in right toe(s): Secondary | ICD-10-CM | POA: Diagnosis not present

## 2024-02-09 DIAGNOSIS — M79675 Pain in left toe(s): Secondary | ICD-10-CM | POA: Diagnosis not present

## 2024-02-09 NOTE — Progress Notes (Signed)
 Subjective: Chief Complaint  Patient presents with   RFC     RFC Non diabetic toenail trim.    89 y.o. returns the office today for painful, elongated, thickened toenails which she cannot trim herself.  Denies any redness or drainage around the nails.     PCP: Theo Iha, MD  Objective: AAO 3, NAD DP/PT pulses 1/4, CRT less than 3 seconds Nails hypertrophic, dystrophic, elongated, brittle, discolored  10. There is tenderness overlying the nails 1-5 bilaterally. There is no surrounding erythema or drainage along the nail sites. Rigid hammertoes are present most of the right second toe.  There is no significant pain today there is no open lesions or preulcerative lesions. No open wounds/ulcerations at this time.  No pain with calf compression, swelling, warmth, erythema.  Assessment: Patient presents with symptomatic onychomycosis, hyperkeratotic lesion, PAD  Plan: Symptomatic onychomycosis -Nails sharply debrided 10 without complication/bleeding.   Return in about 3 months (around 05/11/2024).  Kristine Burnett DPM

## 2024-04-09 ENCOUNTER — Other Ambulatory Visit: Payer: Self-pay | Admitting: Internal Medicine

## 2024-04-09 DIAGNOSIS — Z1231 Encounter for screening mammogram for malignant neoplasm of breast: Secondary | ICD-10-CM

## 2024-04-22 ENCOUNTER — Inpatient Hospital Stay (HOSPITAL_COMMUNITY)

## 2024-04-22 ENCOUNTER — Encounter (HOSPITAL_COMMUNITY): Payer: Self-pay

## 2024-04-22 ENCOUNTER — Other Ambulatory Visit: Payer: Self-pay

## 2024-04-22 ENCOUNTER — Emergency Department (HOSPITAL_COMMUNITY)

## 2024-04-22 ENCOUNTER — Inpatient Hospital Stay (HOSPITAL_COMMUNITY)
Admission: EM | Admit: 2024-04-22 | Discharge: 2024-04-25 | Disposition: A | Source: Home / Self Care | Attending: Internal Medicine | Admitting: Internal Medicine

## 2024-04-22 DIAGNOSIS — I5031 Acute diastolic (congestive) heart failure: Secondary | ICD-10-CM | POA: Diagnosis present

## 2024-04-22 DIAGNOSIS — I5189 Other ill-defined heart diseases: Secondary | ICD-10-CM

## 2024-04-22 DIAGNOSIS — J189 Pneumonia, unspecified organism: Secondary | ICD-10-CM

## 2024-04-22 DIAGNOSIS — I509 Heart failure, unspecified: Principal | ICD-10-CM

## 2024-04-22 LAB — BASIC METABOLIC PANEL WITH GFR
Anion gap: 10 (ref 5–15)
BUN: 25 mg/dL — ABNORMAL HIGH (ref 8–23)
CO2: 27 mmol/L (ref 22–32)
Calcium: 9.3 mg/dL (ref 8.9–10.3)
Chloride: 102 mmol/L (ref 98–111)
Creatinine, Ser: 1.18 mg/dL — ABNORMAL HIGH (ref 0.44–1.00)
GFR, Estimated: 43 mL/min — ABNORMAL LOW
Glucose, Bld: 185 mg/dL — ABNORMAL HIGH (ref 70–99)
Potassium: 4.4 mmol/L (ref 3.5–5.1)
Sodium: 139 mmol/L (ref 135–145)

## 2024-04-22 LAB — CBC
HCT: 31.2 % — ABNORMAL LOW (ref 36.0–46.0)
Hemoglobin: 10.3 g/dL — ABNORMAL LOW (ref 12.0–15.0)
MCH: 31.8 pg (ref 26.0–34.0)
MCHC: 33 g/dL (ref 30.0–36.0)
MCV: 96.3 fL (ref 80.0–100.0)
Platelets: 223 10*3/uL (ref 150–400)
RBC: 3.24 MIL/uL — ABNORMAL LOW (ref 3.87–5.11)
RDW: 14.9 % (ref 11.5–15.5)
WBC: 6.9 10*3/uL (ref 4.0–10.5)
nRBC: 0 % (ref 0.0–0.2)

## 2024-04-22 LAB — ECHOCARDIOGRAM COMPLETE
AR max vel: 2.19 cm2
AV Area VTI: 2.15 cm2
AV Area mean vel: 2.24 cm2
AV Mean grad: 6 mmHg
AV Peak grad: 10.5 mmHg
Ao pk vel: 1.62 m/s
Area-P 1/2: 3.86 cm2
Calc EF: 49.4 %
S' Lateral: 3.5 cm
Single Plane A2C EF: 55.4 %
Single Plane A4C EF: 49.8 %

## 2024-04-22 LAB — TROPONIN T, HIGH SENSITIVITY
Troponin T High Sensitivity: 39 ng/L — ABNORMAL HIGH (ref 0–19)
Troponin T High Sensitivity: 34 ng/L — ABNORMAL HIGH (ref 0–19)

## 2024-04-22 LAB — PRO BRAIN NATRIURETIC PEPTIDE: Pro Brain Natriuretic Peptide: 2510 pg/mL — ABNORMAL HIGH

## 2024-04-22 MED ORDER — FUROSEMIDE 10 MG/ML IJ SOLN
40.0000 mg | Freq: Once | INTRAMUSCULAR | Status: AC
  Administered 2024-04-22: 40 mg via INTRAVENOUS
  Filled 2024-04-22: qty 4

## 2024-04-22 MED ORDER — FUROSEMIDE 10 MG/ML IJ SOLN
40.0000 mg | Freq: Once | INTRAMUSCULAR | Status: AC
  Administered 2024-04-23: 40 mg via INTRAVENOUS
  Filled 2024-04-22: qty 4

## 2024-04-22 MED ORDER — TRAZODONE HCL 50 MG PO TABS: 25.0000 mg | ORAL_TABLET | Freq: Every evening | ORAL | Status: DC | PRN

## 2024-04-22 MED ORDER — IRBESARTAN 150 MG PO TABS: 150.0000 mg | ORAL_TABLET | Freq: Every day | ORAL | Status: DC

## 2024-04-22 MED ORDER — HEPARIN SODIUM (PORCINE) 5000 UNIT/ML IJ SOLN
5000.0000 [IU] | Freq: Three times a day (TID) | INTRAMUSCULAR | Status: DC
  Administered 2024-04-22 – 2024-04-25 (×8): 5000 [IU] via SUBCUTANEOUS
  Filled 2024-04-22 (×9): qty 1

## 2024-04-22 MED ORDER — ONDANSETRON HCL 4 MG/2ML IJ SOLN: 4.0000 mg | Freq: Four times a day (QID) | INTRAMUSCULAR | Status: DC | PRN

## 2024-04-22 MED ORDER — DILTIAZEM HCL 30 MG PO TABS
120.0000 mg | ORAL_TABLET | Freq: Every day | ORAL | Status: DC
  Administered 2024-04-23: 120 mg via ORAL
  Filled 2024-04-22: qty 4

## 2024-04-22 MED ORDER — TAMOXIFEN CITRATE 10 MG PO TABS
20.0000 mg | ORAL_TABLET | Freq: Every day | ORAL | Status: DC
  Administered 2024-04-23 – 2024-04-25 (×3): 20 mg via ORAL
  Filled 2024-04-22 (×3): qty 2

## 2024-04-22 MED ORDER — INFLUENZA VAC SPLIT HIGH-DOSE 0.5 ML IM SUSY
0.5000 mL | PREFILLED_SYRINGE | INTRAMUSCULAR | Status: AC
  Administered 2024-04-23: 0.5 mL via INTRAMUSCULAR
  Filled 2024-04-22: qty 0.5

## 2024-04-22 MED ORDER — VALSARTAN-HYDROCHLOROTHIAZIDE 160-25 MG PO TABS: 1.0000 | ORAL_TABLET | Freq: Every day | ORAL | Status: DC

## 2024-04-22 MED ORDER — ACETAMINOPHEN 325 MG PO TABS: 650.0000 mg | ORAL_TABLET | Freq: Four times a day (QID) | ORAL | Status: DC | PRN

## 2024-04-22 MED ORDER — ALUM & MAG HYDROXIDE-SIMETH 200-200-20 MG/5ML PO SUSP
30.0000 mL | Freq: Four times a day (QID) | ORAL | Status: DC | PRN
  Administered 2024-04-22: 30 mL via ORAL
  Filled 2024-04-22: qty 30

## 2024-04-22 MED ORDER — IRBESARTAN 150 MG PO TABS
150.0000 mg | ORAL_TABLET | Freq: Every day | ORAL | Status: DC
  Administered 2024-04-23: 150 mg via ORAL
  Filled 2024-04-22: qty 1

## 2024-04-22 MED ORDER — ACETAMINOPHEN 650 MG RE SUPP: 650.0000 mg | Freq: Four times a day (QID) | RECTAL | Status: DC | PRN

## 2024-04-22 MED ORDER — ALBUTEROL SULFATE (2.5 MG/3ML) 0.083% IN NEBU: 2.5000 mg | INHALATION_SOLUTION | RESPIRATORY_TRACT | Status: DC | PRN

## 2024-04-22 MED ORDER — ONDANSETRON HCL 4 MG PO TABS: 4.0000 mg | ORAL_TABLET | Freq: Four times a day (QID) | ORAL | Status: DC | PRN

## 2024-04-22 NOTE — ED Triage Notes (Signed)
 Pt BIBA from home for congestion x 2days and shortness of breath developing.   Pt denies N/V/D, body aches, productive cough, or sick contacts.  Afebrile.   97% on room air

## 2024-04-22 NOTE — Progress Notes (Signed)
 Significant abdominal pain with associated nausea and bilious emesis as well as chills without fever.  Portable abdominal film unremarkable although upon my review there appears to be some mild fecal retention in the right colon.  Have made n.p.o. for now.  Holding off on IV fluids since has been admitted for heart failure physiology.  Was only ordered 1 dose of IV Lasix  and pending resolution of symptoms can reevaluate necessity for diuretic in the morning.  Given chills have also asked nurse to continue to follow patient's symptoms especially temperature in the event these are precursor to development of temperature.  In the interim I have ordered PCR to evaluate for COVID, RSV or influenza.  Physical exam completed on patient.  Exam unremarkable.  Current abdominal exam without tenderness.  Patient reports that she feels like something that she ate disagreed with her and that contributed to the emesis and abdominal pain.  She states all of the symptoms have completely resolved.  She states that prior to admission she had taken a laxative and has had multiple bowel movements since that time.

## 2024-04-22 NOTE — Progress Notes (Signed)
" °  Echocardiogram 2D Echocardiogram has been performed.  [name] RDCS 04/22/2024, 3:08 PM "

## 2024-04-22 NOTE — ED Notes (Signed)
 CN aware of pt coming up to room.

## 2024-04-23 ENCOUNTER — Encounter (HOSPITAL_COMMUNITY): Payer: Self-pay | Admitting: Internal Medicine

## 2024-04-23 ENCOUNTER — Inpatient Hospital Stay (HOSPITAL_COMMUNITY)

## 2024-04-23 DIAGNOSIS — I5189 Other ill-defined heart diseases: Secondary | ICD-10-CM

## 2024-04-23 DIAGNOSIS — J189 Pneumonia, unspecified organism: Secondary | ICD-10-CM

## 2024-04-23 LAB — BASIC METABOLIC PANEL WITH GFR
Anion gap: 9 (ref 5–15)
BUN: 24 mg/dL — ABNORMAL HIGH (ref 8–23)
CO2: 27 mmol/L (ref 22–32)
Calcium: 8.9 mg/dL (ref 8.9–10.3)
Chloride: 103 mmol/L (ref 98–111)
Creatinine, Ser: 1.07 mg/dL — ABNORMAL HIGH (ref 0.44–1.00)
GFR, Estimated: 48 mL/min — ABNORMAL LOW
Glucose, Bld: 170 mg/dL — ABNORMAL HIGH (ref 70–99)
Potassium: 3.9 mmol/L (ref 3.5–5.1)
Sodium: 140 mmol/L (ref 135–145)

## 2024-04-23 LAB — PRO BRAIN NATRIURETIC PEPTIDE: Pro Brain Natriuretic Peptide: 7243 pg/mL — ABNORMAL HIGH

## 2024-04-23 LAB — D-DIMER, QUANTITATIVE: D-Dimer, Quant: 1.76 ug{FEU}/mL — ABNORMAL HIGH (ref 0.00–0.50)

## 2024-04-23 LAB — CBC
HCT: 28.1 % — ABNORMAL LOW (ref 36.0–46.0)
Hemoglobin: 9.2 g/dL — ABNORMAL LOW (ref 12.0–15.0)
MCH: 31.2 pg (ref 26.0–34.0)
MCHC: 32.7 g/dL (ref 30.0–36.0)
MCV: 95.3 fL (ref 80.0–100.0)
Platelets: 178 10*3/uL (ref 150–400)
RBC: 2.95 MIL/uL — ABNORMAL LOW (ref 3.87–5.11)
RDW: 14.6 % (ref 11.5–15.5)
WBC: 13.6 10*3/uL — ABNORMAL HIGH (ref 4.0–10.5)
nRBC: 0 % (ref 0.0–0.2)

## 2024-04-23 LAB — RESP PANEL BY RT-PCR (RSV, FLU A&B, COVID)  RVPGX2
Influenza A by PCR: NEGATIVE
Influenza B by PCR: NEGATIVE
Resp Syncytial Virus by PCR: NEGATIVE
SARS Coronavirus 2 by RT PCR: NEGATIVE

## 2024-04-23 MED ORDER — FUROSEMIDE 20 MG PO TABS
20.0000 mg | ORAL_TABLET | Freq: Every day | ORAL | Status: DC
  Administered 2024-04-24: 20 mg via ORAL
  Filled 2024-04-23: qty 1

## 2024-04-23 MED ORDER — IOHEXOL 350 MG/ML SOLN
75.0000 mL | Freq: Once | INTRAVENOUS | Status: AC | PRN
  Administered 2024-04-23: 60 mL via INTRAVENOUS

## 2024-04-23 MED ORDER — AZITHROMYCIN 250 MG PO TABS
500.0000 mg | ORAL_TABLET | Freq: Every day | ORAL | Status: DC
  Administered 2024-04-23 – 2024-04-24 (×2): 500 mg via ORAL
  Filled 2024-04-23 (×2): qty 2

## 2024-04-23 MED ORDER — FUROSEMIDE 10 MG/ML IJ SOLN: 40.0000 mg | Freq: Once | INTRAMUSCULAR | Status: DC

## 2024-04-23 MED ORDER — SODIUM CHLORIDE 0.9 % IV SOLN
1.0000 g | INTRAVENOUS | Status: DC
  Administered 2024-04-23 – 2024-04-24 (×2): 1 g via INTRAVENOUS
  Filled 2024-04-23 (×2): qty 10

## 2024-04-23 NOTE — Progress Notes (Addendum)
 " PROGRESS NOTE    Kristine Burnett  FMW:[id number] DOB: [birth year] DOA: 04/22/2024 PCP: Theo Iha, MD  Brief Narrative: This is a 89 year old female with history of CKD stage IIIb, type 2 diabetes, left breast cancer, status post tamoxifen , peripheral artery disease spinal stenosis hypertension hyperlipidemia and asthma admitted with shortness of breath which started few days prior to admission, orthopnea and dyspnea on exertion.  She was found to have an elevated proBNP of 2500 which increased to 7200 with chest x-ray showing mild cardiomegaly and pulmonary vascular congestion.  She received Lasix  40 mg IV yesterday and today.  Her D-dimer is mildly elevated at 1.76  Assessment & Plan:   Principal Problem:   Acute diastolic (congestive) heart failure (HCC)   #1 acute diastolic heart failure she is admitted with dyspnea on exertion orthopnea lower extremity edema and shortness of breath with elevated proBNP and chest x-ray findings consistent with fluid overload with pulmonary vascular congestion. echocardiogram this admission shows ejection fraction of 50 to 55% with no regional wall motion abnormalities and grade 1 diastolic dysfunction moderately elevated PA systolic pressure. She is still symptomatic ambulating in the room with her walker with dyspnea on exertion and not back to her baseline.  Her BNP is 7243 from 2510 Still with lower extremity edema and decreased breath sounds at the bases on both sides Will check ambulatory oxygen saturation Troponins have been 39 and 34 Her white count increased to 13.6 from 6.9 will closely monitor follow-up in a.m. as of now there is no signs of infection/pneumonia Need to monitor renal functions closely specially since he is going to have a CT of the chest to rule out PE with elevated D-dimer in the setting of history of malignancy with pulmonary hypertension  #2 leukocytosis she denies urinary complaints she had nausea vomiting overnight denies  cough she took laxatives so she has some loose stools prior to coming to the hospital.   #3 hypertension continue atenolol  Cardizem   Holding Diovan  in the setting of diuresis and CT chest with contrast  #4 CKD stage IIIb monitor closely on diuresis as well as with the CT scan she is going to get today.  #5 PAD on aspirin   #6 anemia of chronic disease with no evidence of active bleeding  Estimated body mass index is 25.72 kg/m as calculated from the following:   Height as of this encounter: 5' 7 (1.702 m).   Weight as of this encounter: 74.5 kg.  DVT prophylaxis: Heparin   code Status: Full code  family Communication: Daughter at bedside Disposition Plan:  Status is: Inpatient   Consultants: Cardiology  Procedures: None Antimicrobials: None  Subjective: Patient seen resting in bed she did not sleep well last night due to nausea vomiting, she had some loose stools from the stool softeners/laxatives she took at home prior to getting admitted to hospital.  She denies chest pain however she still has difficulty breathing or walking to the restroom.  Received IV Lasix  yesterday will receive IV Lasix  again today.  Objective: Vitals:   04/22/24 2009 04/22/24 2309 04/23/24 0433 04/23/24 0500  BP: (!) 164/71 (!) 170/93 (!) 160/70   Pulse: 81 96 89   Resp: 17 17 18    Temp: 98.4 F (36.9 C) 98.6 F (37 C) 98.6 F (37 C)   TempSrc: Oral  Oral   SpO2: 100% 93% 100%   Weight: 76.4 kg   74.5 kg  Height: 5' 7 (1.702 m)       Intake/Output  Summary (Last 24 hours) at 04/23/2024 1140 Last data filed at 04/23/2024 1050 Gross per 24 hour  Intake 330 ml  Output 1900 ml  Net -1570 ml   Filed Weights   04/22/24 2009 04/23/24 0500  Weight: 76.4 kg 74.5 kg    Examination:  General exam: Appears in no acute distress Respiratory system: Diminished breath sounds respiratory effort normal. Cardiovascular system: Regular not tachycardic  gastrointestinal system: Abdomen is nondistended,  soft and nontender. No organomegaly or masses felt. Normal bowel sounds heard. Central nervous system: Alert and oriented. No focal neurological deficits. Extremities: 1+ bilateral edema   Data Reviewed: I have personally reviewed following labs and imaging studies  CBC: Recent Labs  Lab 04/22/24 1011 04/23/24 0404  WBC 6.9 13.6*  HGB 10.3* 9.2*  HCT 31.2* 28.1*  MCV 96.3 95.3  PLT 223 178   Basic Metabolic Panel: Recent Labs  Lab 04/22/24 1011 04/23/24 0404  NA 139 140  K 4.4 3.9  CL 102 103  CO2 27 27  GLUCOSE 185* 170*  BUN 25* 24*  CREATININE 1.18* 1.07*  CALCIUM  9.3 8.9   GFR: Estimated Creatinine Clearance: 35.4 mL/min (A) (by C-G formula based on SCr of 1.07 mg/dL (H)). Liver Function Tests: No results for input(s): AST, ALT, ALKPHOS, BILITOT, PROT, ALBUMIN  in the last 168 hours. No results for input(s): LIPASE, AMYLASE in the last 168 hours. No results for input(s): AMMONIA in the last 168 hours. Coagulation Profile: No results for input(s): INR, PROTIME in the last 168 hours. Cardiac Enzymes: No results for input(s): CKTOTAL, CKMB, CKMBINDEX, TROPONINI in the last 168 hours. BNP (last 3 results) Recent Labs    04/22/24 1012 04/23/24 0404  PROBNP 2,510.0* 7,243.0*   HbA1C: No results for input(s): HGBA1C in the last 72 hours. CBG: No results for input(s): GLUCAP in the last 168 hours. Lipid Profile: No results for input(s): CHOL, HDL, LDLCALC, TRIG, CHOLHDL, LDLDIRECT in the last 72 hours. Thyroid Function Tests: No results for input(s): TSH, T4TOTAL, FREET4, T3FREE, THYROIDAB in the last 72 hours. Anemia Panel: No results for input(s): VITAMINB12, FOLATE, FERRITIN, TIBC, IRON , RETICCTPCT in the last 72 hours. Sepsis Labs: No results for input(s): PROCALCITON, LATICACIDVEN in the last 168 hours.  Recent Results (from the past 240 hours)  Resp panel by RT-PCR (RSV, Flu A&B,  Covid) Anterior Nasal Swab     Status: None   Collection Time: 04/22/24 11:15 PM   Specimen: Anterior Nasal Swab  Result Value Ref Range Status   SARS Coronavirus 2 by RT PCR NEGATIVE NEGATIVE Final    Comment: (NOTE) SARS-CoV-2 target nucleic acids are NOT DETECTED.  The SARS-CoV-2 RNA is generally detectable in upper respiratory specimens during the acute phase of infection. The lowest concentration of SARS-CoV-2 viral copies this assay can detect is 138 copies/mL. A negative result does not preclude SARS-Cov-2 infection and should not be used as the sole basis for treatment or other patient management decisions. A negative result may occur with  improper specimen collection/handling, submission of specimen other than nasopharyngeal swab, presence of viral mutation(s) within the areas targeted by this assay, and inadequate number of viral copies(<138 copies/mL). A negative result must be combined with clinical observations, patient history, and epidemiological information. The expected result is Negative.  Fact Sheet for Patients:  bloggercourse.com  Fact Sheet for Healthcare Providers:  seriousbroker.it  This test is no t yet approved or cleared by the [place]  FDA and  has been authorized for detection and/or diagnosis of  SARS-CoV-2 by FDA under an Emergency Use Authorization (EUA). This EUA will remain  in effect (meaning this test can be used) for the duration of the COVID-19 declaration under Section 564(b)(1) of the Act, 21 U.S.C.section 360bbb-3(b)(1), unless the authorization is terminated  or revoked sooner.       Influenza A by PCR NEGATIVE NEGATIVE Final   Influenza B by PCR NEGATIVE NEGATIVE Final    Comment: (NOTE) The Xpert Xpress SARS-CoV-2/FLU/RSV plus assay is intended as an aid in the diagnosis of influenza from Nasopharyngeal swab specimens and should not be used as a sole basis for treatment. Nasal  washings and aspirates are unacceptable for Xpert Xpress SARS-CoV-2/FLU/RSV testing.  Fact Sheet for Patients: bloggercourse.com  Fact Sheet for Healthcare Providers: seriousbroker.it  This test is not yet approved or cleared by the [place]  FDA and has been authorized for detection and/or diagnosis of SARS-CoV-2 by FDA under an Emergency Use Authorization (EUA). This EUA will remain in effect (meaning this test can be used) for the duration of the COVID-19 declaration under Section 564(b)(1) of the Act, 21 U.S.C. section 360bbb-3(b)(1), unless the authorization is terminated or revoked.     Resp Syncytial Virus by PCR NEGATIVE NEGATIVE Final    Comment: (NOTE) Fact Sheet for Patients: bloggercourse.com  Fact Sheet for Healthcare Providers: seriousbroker.it  This test is not yet approved or cleared by the [place]  FDA and has been authorized for detection and/or diagnosis of SARS-CoV-2 by FDA under an Emergency Use Authorization (EUA). This EUA will remain in effect (meaning this test can be used) for the duration of the COVID-19 declaration under Section 564(b)(1) of the Act, 21 U.S.C. section 360bbb-3(b)(1), unless the authorization is terminated or revoked.  Performed at [facility], [address], [place], [place] [address]          Radiology Studies: DG Abd Portable 1V Result Date: 04/22/2024 CLINICAL DATA:  Abdominal pain, vomiting EXAM: PORTABLE ABDOMEN - 1 VIEW COMPARISON:  03/21/2014 FINDINGS: Single frontal view of the abdomen demonstrates an unremarkable bowel gas pattern. No significant fecal retention. No masses or abnormal calcifications. Lung bases are clear. IMPRESSION: 1. Unremarkable bowel gas pattern. Electronically Signed   By: Ozell Daring M.D.   On: 04/22/2024 22:42   ECHOCARDIOGRAM COMPLETE Result Date: 04/22/2024     ECHOCARDIOGRAM REPORT   Patient Name:   Kristine Burnett Date of Exam: 04/22/2024 Medical Rec #:  [id number]     Height:       68.0 in Accession #:    [id number]    Weight:       167.0 lb Date of Birth:  [birth year]      BSA:          1.893 m Patient Age:    89 years      BP:           162/80 mmHg Patient Gender: F             HR:           77 bpm. Exam Location:  Inpatient Procedure: 2D Echo, Cardiac Doppler and Color Doppler (Both Spectral and Color            Flow Doppler were utilized during procedure). Indications:    CHF-Acute Diastolic I50.31  History:        Patient has no prior history of Echocardiogram examinations.                 Risk Factors:Diabetes,  Hypertension and Dyslipidemia.  Sonographer:    Tinnie Gosling RDCS Referring Phys: [id number] MIR M [facility] IMPRESSIONS  1. Left ventricular ejection fraction, by estimation, is 50 to 55%. The left ventricle has low normal function. The left ventricle has no regional wall motion abnormalities. Left ventricular diastolic parameters are consistent with Grade I diastolic dysfunction (impaired relaxation).  2. Right ventricular systolic function is normal. The right ventricular size is normal. There is moderately elevated pulmonary artery systolic pressure.  3. Left atrial size was mildly dilated.  4. The mitral valve is normal in structure. Mild to moderate mitral valve regurgitation.  5. The aortic valve is tricuspid. Aortic valve regurgitation is trivial. Aortic valve sclerosis/calcification is present, without any evidence of aortic stenosis.  6. The inferior vena cava is normal in size with <50% respiratory variability, suggesting right atrial pressure of 8 mmHg. FINDINGS  Left Ventricle: Left ventricular ejection fraction, by estimation, is 50 to 55%. The left ventricle has low normal function. The left ventricle has no regional wall motion abnormalities. The left ventricular internal cavity size was normal in size. There is no left ventricular hypertrophy. Left  ventricular diastolic parameters are consistent with Grade I diastolic dysfunction (impaired relaxation). Right Ventricle: The right ventricular size is normal. Right vetricular wall thickness was not assessed. Right ventricular systolic function is normal. There is moderately elevated pulmonary artery systolic pressure. The tricuspid regurgitant velocity is  3.19 m/s, and with an assumed right atrial pressure of 8 mmHg, the estimated right ventricular systolic pressure is 48.7 mmHg. Left Atrium: Left atrial size was mildly dilated. Right Atrium: Right atrial size was normal in size. Pericardium: There is no evidence of pericardial effusion. Mitral Valve: The mitral valve is normal in structure. There is moderate calcification of the posterior mitral valve leaflet(s). Mildly decreased mobility of the mitral valve leaflets. Mild to moderate mitral valve regurgitation. Tricuspid Valve: The tricuspid valve is normal in structure. Tricuspid valve regurgitation is mild. There is mild prolapse of the tricuspid anterior leaflet. Aortic Valve: The aortic valve is tricuspid. Aortic valve regurgitation is trivial. Aortic valve sclerosis/calcification is present, without any evidence of aortic stenosis. Aortic valve mean gradient measures 6.0 mmHg. Aortic valve peak gradient measures 10.5 mmHg. Aortic valve area, by VTI measures 2.15 cm. Pulmonic Valve: The pulmonic valve was not well visualized. Pulmonic valve regurgitation is trivial. Aorta: The aortic root, ascending aorta, aortic arch and descending aorta are all structurally normal, with no evidence of dilitation or obstruction. Venous: The inferior vena cava is normal in size with less than 50% respiratory variability, suggesting right atrial pressure of 8 mmHg. IAS/Shunts: No atrial level shunt detected by color flow Doppler.  LEFT VENTRICLE PLAX 2D LVIDd:         4.60 cm     Diastology LVIDs:         3.50 cm     LV e' medial:    5.22 cm/s LV PW:         1.20 cm     LV  E/e' medial:  25.1 LV IVS:        1.20 cm     LV e' lateral:   8.81 cm/s LVOT diam:     2.00 cm     LV E/e' lateral: 14.9 LV SV:         85 LV SV Index:   45 LVOT Area:     3.14 cm LV IVRT:       116 msec  LV Volumes (  MOD) LV vol d, MOD A2C: 91.7 ml LV vol d, MOD A4C: 75.9 ml LV vol s, MOD A2C: 40.9 ml LV vol s, MOD A4C: 38.1 ml LV SV MOD A2C:     50.8 ml LV SV MOD A4C:     75.9 ml LV SV MOD BP:      41.3 ml RIGHT VENTRICLE             IVC RV S prime:     13.40 cm/s  IVC diam: 1.80 cm LEFT ATRIUM             Index        RIGHT ATRIUM           Index LA diam:        4.10 cm 2.17 cm/m   RA Area:     17.20 cm LA Vol (A2C):   52.2 ml 27.55 ml/m  RA Volume:   49.80 ml  26.31 ml/m LA Vol (A4C):   64.6 ml 34.13 ml/m LA Biplane Vol: 63.1 ml 33.33 ml/m  AORTIC VALVE AV Area (Vmax):    2.19 cm AV Area (Vmean):   2.24 cm AV Area (VTI):     2.15 cm AV Vmax:           162.00 cm/s AV Vmean:          113.000 cm/s AV VTI:            0.393 m AV Peak Grad:      10.5 mmHg AV Mean Grad:      6.0 mmHg LVOT Vmax:         113.00 cm/s LVOT Vmean:        80.700 cm/s LVOT VTI:          0.269 m LVOT/AV VTI ratio: 0.68  AORTA Ao Root diam: 2.60 cm Ao Asc diam:  3.20 cm MITRAL VALVE                TRICUSPID VALVE MV Area (PHT): 3.86 cm     TR Peak grad:   40.7 mmHg MV E velocity: 131.00 cm/s  TR Vmax:        319.00 cm/s MV A velocity: 108.00 cm/s MV E/A ratio:  1.21         SHUNTS                             Systemic VTI:  0.27 m                             Systemic Diam: 2.00 cm Morene Brownie Electronically signed by Morene Brownie Signature Date/Time: 04/22/2024/3:06:59 PM    Final    DG Chest 2 View Result Date: 04/22/2024 CLINICAL DATA:  Shortness of breath. EXAM: CHEST - 2 VIEW COMPARISON:  10/05/2021 FINDINGS: Heart is mildly enlarged. Mild pulmonary vascular congestion. Strandy opacities at the lung bases likely combination of atelectasis and pulmonary edema. IMPRESSION: Mild cardiomegaly and pulmonary vascular congestion  most consistent with CHF/fluid volume overload. Electronically Signed   By: Aliene Lloyd M.D.   On: 04/22/2024 10:05     Scheduled Meds:  diltiazem   120 mg Oral Daily   heparin   5,000 Units Subcutaneous Q8H   Influenza vac split trivalent PF  0.5 mL Intramuscular Tomorrow-1000   irbesartan   150 mg Oral Daily   tamoxifen   20 mg Oral Daily  Continuous Infusions:   LOS: 1 day     Almarie KANDICE Hoots, MD  04/23/2024, 11:40 AM   "

## 2024-04-24 LAB — LIPID PANEL
Cholesterol: 152 mg/dL (ref 0–200)
HDL: 55 mg/dL
LDL Cholesterol: 83 mg/dL (ref 0–99)
Total CHOL/HDL Ratio: 2.8 ratio
Triglycerides: 71 mg/dL
VLDL: 14 mg/dL (ref 0–40)

## 2024-04-24 LAB — COMPREHENSIVE METABOLIC PANEL WITH GFR
ALT: 68 U/L — ABNORMAL HIGH (ref 0–44)
AST: 91 U/L — ABNORMAL HIGH (ref 15–41)
Albumin: 2.8 g/dL — ABNORMAL LOW (ref 3.5–5.0)
Alkaline Phosphatase: 131 U/L — ABNORMAL HIGH (ref 38–126)
Anion gap: 11 (ref 5–15)
BUN: 16 mg/dL (ref 8–23)
CO2: 27 mmol/L (ref 22–32)
Calcium: 8.8 mg/dL — ABNORMAL LOW (ref 8.9–10.3)
Chloride: 97 mmol/L — ABNORMAL LOW (ref 98–111)
Creatinine, Ser: 0.93 mg/dL (ref 0.44–1.00)
GFR, Estimated: 57 mL/min — ABNORMAL LOW
Glucose, Bld: 108 mg/dL — ABNORMAL HIGH (ref 70–99)
Potassium: 3.4 mmol/L — ABNORMAL LOW (ref 3.5–5.1)
Sodium: 135 mmol/L (ref 135–145)
Total Bilirubin: 1.3 mg/dL — ABNORMAL HIGH (ref 0.0–1.2)
Total Protein: 6.7 g/dL (ref 6.5–8.1)

## 2024-04-24 LAB — CBC
HCT: 29.5 % — ABNORMAL LOW (ref 36.0–46.0)
Hemoglobin: 9.9 g/dL — ABNORMAL LOW (ref 12.0–15.0)
MCH: 31.6 pg (ref 26.0–34.0)
MCHC: 33.6 g/dL (ref 30.0–36.0)
MCV: 94.2 fL (ref 80.0–100.0)
Platelets: 179 10*3/uL (ref 150–400)
RBC: 3.13 MIL/uL — ABNORMAL LOW (ref 3.87–5.11)
RDW: 14.6 % (ref 11.5–15.5)
WBC: 12.2 10*3/uL — ABNORMAL HIGH (ref 4.0–10.5)
nRBC: 0 % (ref 0.0–0.2)

## 2024-04-24 LAB — C-REACTIVE PROTEIN: CRP: 6.5 mg/dL — ABNORMAL HIGH

## 2024-04-24 LAB — SEDIMENTATION RATE: Sed Rate: 63 mm/h — ABNORMAL HIGH (ref 0–22)

## 2024-04-24 MED ORDER — SODIUM CHLORIDE 3 % IN NEBU
4.0000 mL | INHALATION_SOLUTION | Freq: Every day | RESPIRATORY_TRACT | Status: DC
  Administered 2024-04-24 – 2024-04-25 (×2): 4 mL via RESPIRATORY_TRACT
  Filled 2024-04-24 (×2): qty 4

## 2024-04-24 MED ORDER — ALBUTEROL SULFATE (2.5 MG/3ML) 0.083% IN NEBU
2.5000 mg | INHALATION_SOLUTION | Freq: Four times a day (QID) | RESPIRATORY_TRACT | Status: DC
  Administered 2024-04-24 – 2024-04-25 (×3): 2.5 mg via RESPIRATORY_TRACT
  Filled 2024-04-24 (×3): qty 3

## 2024-04-24 MED ORDER — IRBESARTAN 150 MG PO TABS
150.0000 mg | ORAL_TABLET | Freq: Every day | ORAL | Status: DC
  Administered 2024-04-24 – 2024-04-25 (×2): 150 mg via ORAL
  Filled 2024-04-24 (×2): qty 1

## 2024-04-24 MED ORDER — POTASSIUM CHLORIDE CRYS ER 20 MEQ PO TBCR
40.0000 meq | EXTENDED_RELEASE_TABLET | Freq: Once | ORAL | Status: AC
  Administered 2024-04-24: 40 meq via ORAL
  Filled 2024-04-24: qty 2

## 2024-04-24 MED ORDER — AMLODIPINE BESYLATE 10 MG PO TABS
10.0000 mg | ORAL_TABLET | Freq: Every day | ORAL | Status: DC
  Administered 2024-04-24 – 2024-04-25 (×2): 10 mg via ORAL
  Filled 2024-04-24 (×2): qty 1

## 2024-04-24 NOTE — Plan of Care (Signed)
" °  Problem: Education: Goal: Knowledge of General Education information will improve Description: Including pain rating scale, medication(s)/side effects and non-pharmacologic comfort measures Outcome: Progressing   Problem: Health Behavior/Discharge Planning: Goal: Ability to manage health-related needs will improve Outcome: Progressing   Problem: Clinical Measurements: Goal: Respiratory complications will improve Outcome: Progressing Goal: Cardiovascular complication will be avoided Outcome: Progressing   Problem: Activity: Goal: Risk for activity intolerance will decrease Outcome: Progressing   Problem: Nutrition: Goal: Adequate nutrition will be maintained Outcome: Progressing   Problem: Coping: Goal: Level of anxiety will decrease Outcome: Progressing   Problem: Elimination: Goal: Will not experience complications related to bowel motility Outcome: Progressing Goal: Will not experience complications related to urinary retention Outcome: Progressing   Problem: Pain Managment: Goal: General experience of comfort will improve and/or be controlled Outcome: Progressing   Problem: Safety: Goal: Ability to remain free from injury will improve Outcome: Progressing   Problem: Skin Integrity: Goal: Risk for impaired skin integrity will decrease Outcome: Progressing   Problem: Activity: Goal: Capacity to carry out activities will improve Outcome: Progressing   "

## 2024-04-24 NOTE — Progress Notes (Signed)
 "  Rounding Note   Patient Name: Kristine Burnett Date of Encounter: 04/24/2024   [facility] Cardiologist: Georganna Archer, MD   Subjective - No acute events overnight - Patient denies having any symptoms today saying that her breathing is fine and denies chest pain  Scheduled Meds:  albuterol   2.5 mg Nebulization QID   amLODipine   10 mg Oral Daily   azithromycin   500 mg Oral Daily   furosemide   20 mg Oral Daily   heparin   5,000 Units Subcutaneous Q8H   irbesartan   150 mg Oral Daily   sodium chloride  HYPERTONIC  4 mL Nebulization Daily   tamoxifen   20 mg Oral Daily   Continuous Infusions:  cefTRIAXone  (ROCEPHIN )  IV Stopped (04/23/24 1735)   PRN Meds: acetaminophen  **OR** acetaminophen , albuterol , alum & mag hydroxide-simeth, ondansetron  **OR** ondansetron  (ZOFRAN ) IV, traZODone    Vital Signs  Vitals:   04/23/24 1354 04/23/24 1926 04/24/24 0430 04/24/24 1308  BP: (!) 179/76 (!) 166/83 (!) 177/83 (!) 176/84  Pulse: 79 82 86 83  Resp: 18 17 18 18   Temp: 99.1 F (37.3 C) 98.1 F (36.7 C) 97.8 F (36.6 C) 98.7 F (37.1 C)  TempSrc:    Oral  SpO2: 96% 95% 91% 93%  Weight:      Height:        Intake/Output Summary (Last 24 hours) at 04/24/2024 1358 Last data filed at 04/24/2024 0430 Gross per 24 hour  Intake 100 ml  Output 1600 ml  Net -1500 ml      04/23/2024    5:00 AM 04/22/2024    8:09 PM 10/15/2023    6:45 AM  Last 3 Weights  Weight (lbs) 164 lb 3.9 oz 168 lb 6.9 oz 167 lb  Weight (kg) 74.5 kg 76.4 kg 75.751 kg      Telemetry NSR- Personally Reviewed  ECG  No new ECG  Physical Exam  GEN: No acute distress.   Neck: No JVD Cardiac: RRR, no murmurs, rubs, or gallops.  Respiratory: LLL with focal expiratory crackles, no wheezes or rhonchi GI: Soft, nontender, non-distended  MS: Trace bilateral edema -improving; No deformity. Neuro:  Nonfocal  Psych: Normal affect   Labs High Sensitivity Troponin:  No results for input(s): TROPONINIHS in the  last 720 hours.  Recent Labs  Lab 04/22/24 1012 04/22/24 1141  TRNPT 39* 34*       Chemistry Recent Labs  Lab 04/22/24 1011 04/23/24 0404 04/24/24 0422  NA 139 140 135  K 4.4 3.9 3.4*  CL 102 103 97*  CO2 27 27 27   GLUCOSE 185* 170* 108*  BUN 25* 24* 16  CREATININE 1.18* 1.07* 0.93  CALCIUM  9.3 8.9 8.8*  PROT  --   --  6.7  ALBUMIN   --   --  2.8*  AST  --   --  91*  ALT  --   --  68*  ALKPHOS  --   --  131*  BILITOT  --   --  1.3*  GFRNONAA 43* 48* 57*  ANIONGAP 10 9 11     Lipids  Recent Labs  Lab 04/23/24 1024  CHOL 152  TRIG 71  HDL 55  LDLCALC 83  CHOLHDL 2.8    Hematology Recent Labs  Lab 04/22/24 1011 04/23/24 0404 04/24/24 0422  WBC 6.9 13.6* 12.2*  RBC 3.24* 2.95* 3.13*  HGB 10.3* 9.2* 9.9*  HCT 31.2* 28.1* 29.5*  MCV 96.3 95.3 94.2  MCH 31.8 31.2 31.6  MCHC 33.0 32.7 33.6  RDW 14.9 14.6 14.6  PLT 223 178 179   Thyroid No results for input(s): TSH, FREET4 in the last 168 hours.  BNP Recent Labs  Lab 04/22/24 1012 04/23/24 0404  PROBNP 2,510.0* 7,243.0*    DDimer  Recent Labs  Lab 04/23/24 1024  DDIMER 1.76*     Radiology  CT Angio Chest Pulmonary Embolism (PE) W or WO Contrast Result Date: 04/23/2024 CLINICAL DATA:  Positive D-dimer, shortness of breath EXAM: CT ANGIOGRAPHY CHEST WITH CONTRAST TECHNIQUE: Multidetector CT imaging of the chest was performed using the standard protocol during bolus administration of intravenous contrast. Multiplanar CT image reconstructions and MIPs were obtained to evaluate the vascular anatomy. RADIATION DOSE REDUCTION: This exam was performed according to the departmental dose-optimization program which includes automated exposure control, adjustment of the mA and/or kV according to patient size and/or use of iterative reconstruction technique. CONTRAST:  60mL OMNIPAQUE  IOHEXOL  350 MG/ML SOLN COMPARISON:  None Available. FINDINGS: Cardiovascular: Satisfactory opacification of the pulmonary arteries to  the segmental level. No evidence of pulmonary embolism. Mild cardiomegaly. Mild coronary artery calcifications are noted. No pericardial effusion. Mediastinum/Nodes: No enlarged mediastinal, hilar, or axillary lymph nodes. Thyroid gland, trachea, and esophagus demonstrate no significant findings. Lungs/Pleura: Minimal bilateral pleural effusions are noted with adjacent subsegmental atelectasis. No pneumothorax is noted. 4 mm nodule is noted in right upper lobe best seen on image number 34 series 6. Possible aspirated material or mucous plugging seen in right lower lobe bronchus. Upper Abdomen: No acute abnormality. Musculoskeletal: No chest wall abnormality. No acute or significant osseous findings. Review of the MIP images confirms the above findings. IMPRESSION: 1. No definite evidence of pulmonary embolus. 2. Mild coronary artery calcifications are noted. 3. Minimal bilateral pleural effusions are noted with adjacent subsegmental atelectasis. 4. 4 mm nodule is noted in right upper lobe. 5. Possible aspirated material or mucous plugging seen in right lower lobe bronchus. Electronically Signed   By: Lynwood Landy Raddle M.D.   On: 04/23/2024 14:10   DG Abd Portable 1V Result Date: 04/22/2024 CLINICAL DATA:  Abdominal pain, vomiting EXAM: PORTABLE ABDOMEN - 1 VIEW COMPARISON:  03/21/2014 FINDINGS: Single frontal view of the abdomen demonstrates an unremarkable bowel gas pattern. No significant fecal retention. No masses or abnormal calcifications. Lung bases are clear. IMPRESSION: 1. Unremarkable bowel gas pattern. Electronically Signed   By: Ozell Daring M.D.   On: 04/22/2024 22:42   ECHOCARDIOGRAM COMPLETE Result Date: 04/22/2024    ECHOCARDIOGRAM REPORT   Patient Name:   Kristine Burnett Date of Exam: 04/22/2024 Medical Rec #:  [id number]     Height:       68.0 in Accession #:    [id number]    Weight:       167.0 lb Date of Birth:  09-Sep-1929      BSA:          1.893 m Patient Age:    89 years      BP:           162/80  mmHg Patient Gender: F             HR:           77 bpm. Exam Location:  Inpatient Procedure: 2D Echo, Cardiac Doppler and Color Doppler (Both Spectral and Color            Flow Doppler were utilized during procedure). Indications:    CHF-Acute Diastolic I50.31  History:        Patient has  no prior history of Echocardiogram examinations.                 Risk Factors:Diabetes, Hypertension and Dyslipidemia.  Sonographer:    Tinnie Gosling RDCS Referring Phys: [id number] MIR M [facility] IMPRESSIONS  1. Left ventricular ejection fraction, by estimation, is 50 to 55%. The left ventricle has low normal function. The left ventricle has no regional wall motion abnormalities. Left ventricular diastolic parameters are consistent with Grade I diastolic dysfunction (impaired relaxation).  2. Right ventricular systolic function is normal. The right ventricular size is normal. There is moderately elevated pulmonary artery systolic pressure.  3. Left atrial size was mildly dilated.  4. The mitral valve is normal in structure. Mild to moderate mitral valve regurgitation.  5. The aortic valve is tricuspid. Aortic valve regurgitation is trivial. Aortic valve sclerosis/calcification is present, without any evidence of aortic stenosis.  6. The inferior vena cava is normal in size with <50% respiratory variability, suggesting right atrial pressure of 8 mmHg. FINDINGS  Left Ventricle: Left ventricular ejection fraction, by estimation, is 50 to 55%. The left ventricle has low normal function. The left ventricle has no regional wall motion abnormalities. The left ventricular internal cavity size was normal in size. There is no left ventricular hypertrophy. Left ventricular diastolic parameters are consistent with Grade I diastolic dysfunction (impaired relaxation). Right Ventricle: The right ventricular size is normal. Right vetricular wall thickness was not assessed. Right ventricular systolic function is normal. There is moderately  elevated pulmonary artery systolic pressure. The tricuspid regurgitant velocity is  3.19 m/s, and with an assumed right atrial pressure of 8 mmHg, the estimated right ventricular systolic pressure is 48.7 mmHg. Left Atrium: Left atrial size was mildly dilated. Right Atrium: Right atrial size was normal in size. Pericardium: There is no evidence of pericardial effusion. Mitral Valve: The mitral valve is normal in structure. There is moderate calcification of the posterior mitral valve leaflet(s). Mildly decreased mobility of the mitral valve leaflets. Mild to moderate mitral valve regurgitation. Tricuspid Valve: The tricuspid valve is normal in structure. Tricuspid valve regurgitation is mild. There is mild prolapse of the tricuspid anterior leaflet. Aortic Valve: The aortic valve is tricuspid. Aortic valve regurgitation is trivial. Aortic valve sclerosis/calcification is present, without any evidence of aortic stenosis. Aortic valve mean gradient measures 6.0 mmHg. Aortic valve peak gradient measures 10.5 mmHg. Aortic valve area, by VTI measures 2.15 cm. Pulmonic Valve: The pulmonic valve was not well visualized. Pulmonic valve regurgitation is trivial. Aorta: The aortic root, ascending aorta, aortic arch and descending aorta are all structurally normal, with no evidence of dilitation or obstruction. Venous: The inferior vena cava is normal in size with less than 50% respiratory variability, suggesting right atrial pressure of 8 mmHg. IAS/Shunts: No atrial level shunt detected by color flow Doppler.  LEFT VENTRICLE PLAX 2D LVIDd:         4.60 cm     Diastology LVIDs:         3.50 cm     LV e' medial:    5.22 cm/s LV PW:         1.20 cm     LV E/e' medial:  25.1 LV IVS:        1.20 cm     LV e' lateral:   8.81 cm/s LVOT diam:     2.00 cm     LV E/e' lateral: 14.9 LV SV:         85 LV SV Index:  45 LVOT Area:     3.14 cm LV IVRT:       116 msec  LV Volumes (MOD) LV vol d, MOD A2C: 91.7 ml LV vol d, MOD A4C: 75.9 ml  LV vol s, MOD A2C: 40.9 ml LV vol s, MOD A4C: 38.1 ml LV SV MOD A2C:     50.8 ml LV SV MOD A4C:     75.9 ml LV SV MOD BP:      41.3 ml RIGHT VENTRICLE             IVC RV S prime:     13.40 cm/s  IVC diam: 1.80 cm LEFT ATRIUM             Index        RIGHT ATRIUM           Index LA diam:        4.10 cm 2.17 cm/m   RA Area:     17.20 cm LA Vol (A2C):   52.2 ml 27.55 ml/m  RA Volume:   49.80 ml  26.31 ml/m LA Vol (A4C):   64.6 ml 34.13 ml/m LA Biplane Vol: 63.1 ml 33.33 ml/m  AORTIC VALVE AV Area (Vmax):    2.19 cm AV Area (Vmean):   2.24 cm AV Area (VTI):     2.15 cm AV Vmax:           162.00 cm/s AV Vmean:          113.000 cm/s AV VTI:            0.393 m AV Peak Grad:      10.5 mmHg AV Mean Grad:      6.0 mmHg LVOT Vmax:         113.00 cm/s LVOT Vmean:        80.700 cm/s LVOT VTI:          0.269 m LVOT/AV VTI ratio: 0.68  AORTA Ao Root diam: 2.60 cm Ao Asc diam:  3.20 cm MITRAL VALVE                TRICUSPID VALVE MV Area (PHT): 3.86 cm     TR Peak grad:   40.7 mmHg MV E velocity: 131.00 cm/s  TR Vmax:        319.00 cm/s MV A velocity: 108.00 cm/s MV E/A ratio:  1.21         SHUNTS                             Systemic VTI:  0.27 m                             Systemic Diam: 2.00 cm Morene Brownie Electronically signed by Morene Brownie Signature Date/Time: 04/22/2024/3:06:59 PM    Final     Cardiac Studies   TTE 04/22/24:  IMPRESSIONS     1. Left ventricular ejection fraction, by estimation, is 50 to 55%. The  left ventricle has low normal function. The left ventricle has no regional  wall motion abnormalities. Left ventricular diastolic parameters are  consistent with Grade I diastolic  dysfunction (impaired relaxation).   2. Right ventricular systolic function is normal. The right ventricular  size is normal. There is moderately elevated pulmonary artery systolic  pressure.   3. Left atrial size was mildly dilated.   4. The mitral valve is normal  in structure. Mild to moderate mitral  valve  regurgitation.   5. The aortic valve is tricuspid. Aortic valve regurgitation is trivial.  Aortic valve sclerosis/calcification is present, without any evidence of  aortic stenosis.   6. The inferior vena cava is normal in size with <50% respiratory  variability, suggesting right atrial pressure of 8 mmHg.   Patient Profile   Kristine Burnett is a 89 y.o. female with a hx of hypertension, hyperlipidemia, asthma, recurrent appendicitis s/p appendectomy 2016, hepatitis, CKD, diabetes type 2, left breast cancer diagnosed February 2022 underwent tamoxifen  and tolerated well, osteopenia, PAD, spinal stenosis, who is being seen 04/23/2024 for the evaluation of acute CHF at the request of Dr. Will.   Assessment & Plan    #Acute HFpEF Exacerbation - Patient presented with 3 days of shortness of breath when laying flat and wheezing. - CXR concerning for pulmonary edema and proBNP elevated. - CT PE negative for PE, but concerning for possible aspiration material in the right lower lobe bronchus. - The patient has an elevated E/e' on TTE despite having preserved LV systolic function suggesting elevated pulmonary capillary wedge pressure. -She did have some volume overload, but I believe that infection likely tipped her over into this volume overloaded state. -Currently the patient appears to be mostly euvolemic so we will transition her to an oral diuretic regiment. - In terms of GDMT for HFpEF, I do not think the initiation of an SGLT2 or spironolactone is indicated at this time.  There were very few 89 year old patients enrolled in the clinical studies that got these medications approved.  Furthermore an SGLT2 can reduce the risk of repeat hospitalization for this patient; however, I believe that the potential mortality benefit is almost negligible in someone his age.  The risk of a genitourinary infection, which could have catastrophic consequences in someone his age, outweigh the potential benefit  in my opinion.  I discussed this with the patient and her daughter who are at bedside Start Lasix  20 mg p.o. daily  #PNA Treatment per primary     For questions or updates, please contact [facility] Please consult [email or web address] for contact info under       Signed, Georganna Archer, MD  04/24/2024, 1:58 PM    "

## 2024-04-24 NOTE — Progress Notes (Addendum)
 " PROGRESS NOTE    Kristine Burnett  FMW:[id number] DOB: 1929/06/18 DOA: 04/22/2024 PCP: Theo Iha, MD  Brief Narrative: This is a 89 year old female with history of CKD stage IIIb, type 2 diabetes, left breast cancer, status post tamoxifen , peripheral artery disease spinal stenosis hypertension hyperlipidemia and asthma admitted with shortness of breath which started few days prior to admission, orthopnea and dyspnea on exertion.  She was found to have an elevated proBNP of 2500 which increased to 7200 with chest x-ray showing mild cardiomegaly and pulmonary vascular congestion.  She received Lasix  40 mg IV yesterday and today.  Her D-dimer is mildly elevated at 1.76  Assessment & Plan:   Principal Problem:   Acute diastolic (congestive) heart failure (HCC) Active Problems:   Diastolic dysfunction   Pneumonia of left lower lobe due to infectious organism   #1 Acute diastolic heart failure she was admitted with dyspnea on exertion orthopnea lower extremity edema and shortness of breath with elevated proBNP and chest x-ray findings consistent with fluid overload with pulmonary vascular congestion. Echocardiogram this admission shows ejection fraction of 50 to 55% with no regional wall motion abnormalities and grade 1 diastolic dysfunction moderately elevated PA systolic pressure. Her pro BNP is 7243 from 2510 Troponins have been flat 39 and 34 Continue Lasix  per cardiology Hypokalemia potassium 3.4 being repleted Await PT evaluation and ambulatory oxygen saturation  #2  Aspiration pneumonia CT chest shows possible aspirated material or mucous plugging seen in the right lower lobe bronchus.  No evidence of pulmonary embolism. Continue Rocephin  azithromycin  she is allergic to penicillin Will consult RT for chest PT and nebs with hypertonic saline  Consult speech White count 12.2 from 13.6   #3 hypertension blood pressure elevated at 177 Avapro  and Norvasc  restarted  #4 CKD stage IIIb  follow-up in a.m. so far stable.  #5 PAD on aspirin   #6 anemia of chronic disease with no evidence of active bleeding  #7 elevated LFTs AST ALT and alk phos is up follow-up in AM. If trending up consider right upper quadrant ultrasound and checking hepatitis panel etc.  Estimated body mass index is 25.72 kg/m as calculated from the following:   Height as of this encounter: 5' 7 (1.702 m).   Weight as of this encounter: 74.5 kg.  DVT prophylaxis: Heparin   code Status: Full code  family Communication: Daughter at bedside Disposition Plan:  Status is: Inpatient   Consultants: Cardiology  Procedures: None Antimicrobials: None  Subjective:  Creatinine stable daughter at bedside per her patient has not walked yet patient lives at home with her oldest daughter She reports her cough is better and breathing is better  Objective: Vitals:   04/23/24 0500 04/23/24 1354 04/23/24 1926 04/24/24 0430  BP:  (!) 179/76 (!) 166/83 (!) 177/83  Pulse:  79 82 86  Resp:  18 17 18   Temp:  99.1 F (37.3 C) 98.1 F (36.7 C) 97.8 F (36.6 C)  TempSrc:      SpO2:  96% 95% 91%  Weight: 74.5 kg     Height:        Intake/Output Summary (Last 24 hours) at 04/24/2024 1140 Last data filed at 04/24/2024 0430 Gross per 24 hour  Intake 100 ml  Output 1600 ml  Net -1500 ml   Filed Weights   04/22/24 2009 04/23/24 0500  Weight: 76.4 kg 74.5 kg    Examination:  General exam: Appears in no acute distress Respiratory system: Diminished breath sounds respiratory effort normal. Cardiovascular  system: Regular not tachycardic  gastrointestinal system: Abdomen is nondistended, soft and nontender. No organomegaly or masses felt. Normal bowel sounds heard. Central nervous system: Alert and oriented. No focal neurological deficits. Extremities: 1+ bilateral edema   Data Reviewed: I have personally reviewed following labs and imaging studies  CBC: Recent Labs  Lab 04/22/24 1011 04/23/24 0404  04/24/24 0422  WBC 6.9 13.6* 12.2*  HGB 10.3* 9.2* 9.9*  HCT 31.2* 28.1* 29.5*  MCV 96.3 95.3 94.2  PLT 223 178 179   Basic Metabolic Panel: Recent Labs  Lab 04/22/24 1011 04/23/24 0404 04/24/24 0422  NA 139 140 135  K 4.4 3.9 3.4*  CL 102 103 97*  CO2 27 27 27   GLUCOSE 185* 170* 108*  BUN 25* 24* 16  CREATININE 1.18* 1.07* 0.93  CALCIUM  9.3 8.9 8.8*   GFR: Estimated Creatinine Clearance: 40.7 mL/min (by C-G formula based on SCr of 0.93 mg/dL). Liver Function Tests: Recent Labs  Lab 04/24/24 0422  AST 91*  ALT 68*  ALKPHOS 131*  BILITOT 1.3*  PROT 6.7  ALBUMIN  2.8*   No results for input(s): LIPASE, AMYLASE in the last 168 hours. No results for input(s): AMMONIA in the last 168 hours. Coagulation Profile: No results for input(s): INR, PROTIME in the last 168 hours. Cardiac Enzymes: No results for input(s): CKTOTAL, CKMB, CKMBINDEX, TROPONINI in the last 168 hours. BNP (last 3 results) Recent Labs    04/22/24 1012 04/23/24 0404  PROBNP 2,510.0* 7,243.0*   HbA1C: No results for input(s): HGBA1C in the last 72 hours. CBG: No results for input(s): GLUCAP in the last 168 hours. Lipid Profile: Recent Labs    04/23/24 1024  CHOL 152  HDL 55  LDLCALC 83  TRIG 71  CHOLHDL 2.8   Thyroid Function Tests: No results for input(s): TSH, T4TOTAL, FREET4, T3FREE, THYROIDAB in the last 72 hours. Anemia Panel: No results for input(s): VITAMINB12, FOLATE, FERRITIN, TIBC, IRON , RETICCTPCT in the last 72 hours. Sepsis Labs: No results for input(s): PROCALCITON, LATICACIDVEN in the last 168 hours.  Recent Results (from the past 240 hours)  Resp panel by RT-PCR (RSV, Flu A&B, Covid) Anterior Nasal Swab     Status: None   Collection Time: 04/22/24 11:15 PM   Specimen: Anterior Nasal Swab  Result Value Ref Range Status   SARS Coronavirus 2 by RT PCR NEGATIVE NEGATIVE Final    Comment: (NOTE) SARS-CoV-2 target nucleic  acids are NOT DETECTED.  The SARS-CoV-2 RNA is generally detectable in upper respiratory specimens during the acute phase of infection. The lowest concentration of SARS-CoV-2 viral copies this assay can detect is 138 copies/mL. A negative result does not preclude SARS-Cov-2 infection and should not be used as the sole basis for treatment or other patient management decisions. A negative result may occur with  improper specimen collection/handling, submission of specimen other than nasopharyngeal swab, presence of viral mutation(s) within the areas targeted by this assay, and inadequate number of viral copies(<138 copies/mL). A negative result must be combined with clinical observations, patient history, and epidemiological information. The expected result is Negative.  Fact Sheet for Patients:  bloggercourse.com  Fact Sheet for Healthcare Providers:  seriousbroker.it  This test is no t yet approved or cleared by the [place]  FDA and  has been authorized for detection and/or diagnosis of SARS-CoV-2 by FDA under an Emergency Use Authorization (EUA). This EUA will remain  in effect (meaning this test can be used) for the duration of the COVID-19 declaration under Section  564(b)(1) of the Act, 21 U.S.C.section 360bbb-3(b)(1), unless the authorization is terminated  or revoked sooner.       Influenza A by PCR NEGATIVE NEGATIVE Final   Influenza B by PCR NEGATIVE NEGATIVE Final    Comment: (NOTE) The Xpert Xpress SARS-CoV-2/FLU/RSV plus assay is intended as an aid in the diagnosis of influenza from Nasopharyngeal swab specimens and should not be used as a sole basis for treatment. Nasal washings and aspirates are unacceptable for Xpert Xpress SARS-CoV-2/FLU/RSV testing.  Fact Sheet for Patients: bloggercourse.com  Fact Sheet for Healthcare Providers: seriousbroker.it  This  test is not yet approved or cleared by the [place]  FDA and has been authorized for detection and/or diagnosis of SARS-CoV-2 by FDA under an Emergency Use Authorization (EUA). This EUA will remain in effect (meaning this test can be used) for the duration of the COVID-19 declaration under Section 564(b)(1) of the Act, 21 U.S.C. section 360bbb-3(b)(1), unless the authorization is terminated or revoked.     Resp Syncytial Virus by PCR NEGATIVE NEGATIVE Final    Comment: (NOTE) Fact Sheet for Patients: bloggercourse.com  Fact Sheet for Healthcare Providers: seriousbroker.it  This test is not yet approved or cleared by the [place]  FDA and has been authorized for detection and/or diagnosis of SARS-CoV-2 by FDA under an Emergency Use Authorization (EUA). This EUA will remain in effect (meaning this test can be used) for the duration of the COVID-19 declaration under Section 564(b)(1) of the Act, 21 U.S.C. section 360bbb-3(b)(1), unless the authorization is terminated or revoked.  Performed at [facility], [address], [place], [place] [address]          Radiology Studies: CT Angio Chest Pulmonary Embolism (PE) W or WO Contrast Result Date: 04/23/2024 CLINICAL DATA:  Positive D-dimer, shortness of breath EXAM: CT ANGIOGRAPHY CHEST WITH CONTRAST TECHNIQUE: Multidetector CT imaging of the chest was performed using the standard protocol during bolus administration of intravenous contrast. Multiplanar CT image reconstructions and MIPs were obtained to evaluate the vascular anatomy. RADIATION DOSE REDUCTION: This exam was performed according to the departmental dose-optimization program which includes automated exposure control, adjustment of the mA and/or kV according to patient size and/or use of iterative reconstruction technique. CONTRAST:  60mL OMNIPAQUE  IOHEXOL  350 MG/ML SOLN COMPARISON:  None Available.  FINDINGS: Cardiovascular: Satisfactory opacification of the pulmonary arteries to the segmental level. No evidence of pulmonary embolism. Mild cardiomegaly. Mild coronary artery calcifications are noted. No pericardial effusion. Mediastinum/Nodes: No enlarged mediastinal, hilar, or axillary lymph nodes. Thyroid gland, trachea, and esophagus demonstrate no significant findings. Lungs/Pleura: Minimal bilateral pleural effusions are noted with adjacent subsegmental atelectasis. No pneumothorax is noted. 4 mm nodule is noted in right upper lobe best seen on image number 34 series 6. Possible aspirated material or mucous plugging seen in right lower lobe bronchus. Upper Abdomen: No acute abnormality. Musculoskeletal: No chest wall abnormality. No acute or significant osseous findings. Review of the MIP images confirms the above findings. IMPRESSION: 1. No definite evidence of pulmonary embolus. 2. Mild coronary artery calcifications are noted. 3. Minimal bilateral pleural effusions are noted with adjacent subsegmental atelectasis. 4. 4 mm nodule is noted in right upper lobe. 5. Possible aspirated material or mucous plugging seen in right lower lobe bronchus. Electronically Signed   By: Lynwood Landy Raddle M.D.   On: 04/23/2024 14:10   DG Abd Portable 1V Result Date: 04/22/2024 CLINICAL DATA:  Abdominal pain, vomiting EXAM: PORTABLE ABDOMEN - 1 VIEW COMPARISON:  03/21/2014 FINDINGS: Single  frontal view of the abdomen demonstrates an unremarkable bowel gas pattern. No significant fecal retention. No masses or abnormal calcifications. Lung bases are clear. IMPRESSION: 1. Unremarkable bowel gas pattern. Electronically Signed   By: Ozell Daring M.D.   On: 04/22/2024 22:42   ECHOCARDIOGRAM COMPLETE Result Date: 04/22/2024    ECHOCARDIOGRAM REPORT   Patient Name:   MELLISA ARSHAD Date of Exam: 04/22/2024 Medical Rec #:  [id number]     Height:       68.0 in Accession #:    [id number]    Weight:       167.0 lb Date of Birth:   1929-06-06      BSA:          1.893 m Patient Age:    89 years      BP:           162/80 mmHg Patient Gender: F             HR:           77 bpm. Exam Location:  Inpatient Procedure: 2D Echo, Cardiac Doppler and Color Doppler (Both Spectral and Color            Flow Doppler were utilized during procedure). Indications:    CHF-Acute Diastolic I50.31  History:        Patient has no prior history of Echocardiogram examinations.                 Risk Factors:Diabetes, Hypertension and Dyslipidemia.  Sonographer:    Tinnie Gosling RDCS Referring Phys: [id number] MIR M [facility] IMPRESSIONS  1. Left ventricular ejection fraction, by estimation, is 50 to 55%. The left ventricle has low normal function. The left ventricle has no regional wall motion abnormalities. Left ventricular diastolic parameters are consistent with Grade I diastolic dysfunction (impaired relaxation).  2. Right ventricular systolic function is normal. The right ventricular size is normal. There is moderately elevated pulmonary artery systolic pressure.  3. Left atrial size was mildly dilated.  4. The mitral valve is normal in structure. Mild to moderate mitral valve regurgitation.  5. The aortic valve is tricuspid. Aortic valve regurgitation is trivial. Aortic valve sclerosis/calcification is present, without any evidence of aortic stenosis.  6. The inferior vena cava is normal in size with <50% respiratory variability, suggesting right atrial pressure of 8 mmHg. FINDINGS  Left Ventricle: Left ventricular ejection fraction, by estimation, is 50 to 55%. The left ventricle has low normal function. The left ventricle has no regional wall motion abnormalities. The left ventricular internal cavity size was normal in size. There is no left ventricular hypertrophy. Left ventricular diastolic parameters are consistent with Grade I diastolic dysfunction (impaired relaxation). Right Ventricle: The right ventricular size is normal. Right vetricular wall thickness was  not assessed. Right ventricular systolic function is normal. There is moderately elevated pulmonary artery systolic pressure. The tricuspid regurgitant velocity is  3.19 m/s, and with an assumed right atrial pressure of 8 mmHg, the estimated right ventricular systolic pressure is 48.7 mmHg. Left Atrium: Left atrial size was mildly dilated. Right Atrium: Right atrial size was normal in size. Pericardium: There is no evidence of pericardial effusion. Mitral Valve: The mitral valve is normal in structure. There is moderate calcification of the posterior mitral valve leaflet(s). Mildly decreased mobility of the mitral valve leaflets. Mild to moderate mitral valve regurgitation. Tricuspid Valve: The tricuspid valve is normal in structure. Tricuspid valve regurgitation is mild. There is mild prolapse of the tricuspid anterior  leaflet. Aortic Valve: The aortic valve is tricuspid. Aortic valve regurgitation is trivial. Aortic valve sclerosis/calcification is present, without any evidence of aortic stenosis. Aortic valve mean gradient measures 6.0 mmHg. Aortic valve peak gradient measures 10.5 mmHg. Aortic valve area, by VTI measures 2.15 cm. Pulmonic Valve: The pulmonic valve was not well visualized. Pulmonic valve regurgitation is trivial. Aorta: The aortic root, ascending aorta, aortic arch and descending aorta are all structurally normal, with no evidence of dilitation or obstruction. Venous: The inferior vena cava is normal in size with less than 50% respiratory variability, suggesting right atrial pressure of 8 mmHg. IAS/Shunts: No atrial level shunt detected by color flow Doppler.  LEFT VENTRICLE PLAX 2D LVIDd:         4.60 cm     Diastology LVIDs:         3.50 cm     LV e' medial:    5.22 cm/s LV PW:         1.20 cm     LV E/e' medial:  25.1 LV IVS:        1.20 cm     LV e' lateral:   8.81 cm/s LVOT diam:     2.00 cm     LV E/e' lateral: 14.9 LV SV:         85 LV SV Index:   45 LVOT Area:     3.14 cm LV IVRT:        116 msec  LV Volumes (MOD) LV vol d, MOD A2C: 91.7 ml LV vol d, MOD A4C: 75.9 ml LV vol s, MOD A2C: 40.9 ml LV vol s, MOD A4C: 38.1 ml LV SV MOD A2C:     50.8 ml LV SV MOD A4C:     75.9 ml LV SV MOD BP:      41.3 ml RIGHT VENTRICLE             IVC RV S prime:     13.40 cm/s  IVC diam: 1.80 cm LEFT ATRIUM             Index        RIGHT ATRIUM           Index LA diam:        4.10 cm 2.17 cm/m   RA Area:     17.20 cm LA Vol (A2C):   52.2 ml 27.55 ml/m  RA Volume:   49.80 ml  26.31 ml/m LA Vol (A4C):   64.6 ml 34.13 ml/m LA Biplane Vol: 63.1 ml 33.33 ml/m  AORTIC VALVE AV Area (Vmax):    2.19 cm AV Area (Vmean):   2.24 cm AV Area (VTI):     2.15 cm AV Vmax:           162.00 cm/s AV Vmean:          113.000 cm/s AV VTI:            0.393 m AV Peak Grad:      10.5 mmHg AV Mean Grad:      6.0 mmHg LVOT Vmax:         113.00 cm/s LVOT Vmean:        80.700 cm/s LVOT VTI:          0.269 m LVOT/AV VTI ratio: 0.68  AORTA Ao Root diam: 2.60 cm Ao Asc diam:  3.20 cm MITRAL VALVE                TRICUSPID VALVE MV  Area (PHT): 3.86 cm     TR Peak grad:   40.7 mmHg MV E velocity: 131.00 cm/s  TR Vmax:        319.00 cm/s MV A velocity: 108.00 cm/s MV E/A ratio:  1.21         SHUNTS                             Systemic VTI:  0.27 m                             Systemic Diam: 2.00 cm Morene Brownie Electronically signed by Morene Brownie Signature Date/Time: 04/22/2024/3:06:59 PM    Final      Scheduled Meds:  amLODipine   10 mg Oral Daily   azithromycin   500 mg Oral Daily   furosemide   20 mg Oral Daily   heparin   5,000 Units Subcutaneous Q8H   irbesartan   150 mg Oral Daily   potassium chloride   40 mEq Oral Once   tamoxifen   20 mg Oral Daily   Continuous Infusions:  cefTRIAXone  (ROCEPHIN )  IV Stopped (04/23/24 1735)     LOS: 2 days   Almarie KANDICE Hoots, MD  04/24/2024, 11:40 AM   "

## 2024-04-24 NOTE — Progress Notes (Signed)
 Mobility Specialist - Progress Note  (RA) Pre-mobility: 90 bpm HR, 97% SpO2 During mobility: 111 bpm HR, 94% SpO2 Post-mobility: 99 bpm HR, 96% SPO2   04/24/24 1602  Mobility  Activity Ambulated with assistance  Level of Assistance Contact guard assist, steadying assist  Assistive Device Four wheel walker  Distance Ambulated (ft) 160 ft  Range of Motion/Exercises Active  Activity Response Tolerated well  Mobility visit 1 Mobility  Mobility Specialist Start Time (ACUTE ONLY) 1550  Mobility Specialist Stop Time (ACUTE ONLY) 1602  Mobility Specialist Time Calculation (min) (ACUTE ONLY) 12 min   Pt was found in bed and agreeable to mobilize. No complaints. At EOS returned to bed with all needs met. Call bell in reach. RN notified.   Erminio Leos,  Mobility Specialist Can be reached via Secure Chat

## 2024-04-24 NOTE — Progress Notes (Signed)
 MD Will aware of elevated BP's. She stated the lasix  should help bring BP down. No new orders given.

## 2024-04-25 ENCOUNTER — Inpatient Hospital Stay (HOSPITAL_COMMUNITY)

## 2024-04-25 ENCOUNTER — Encounter: Payer: Self-pay | Admitting: Student in an Organized Health Care Education/Training Program

## 2024-04-25 ENCOUNTER — Other Ambulatory Visit (HOSPITAL_COMMUNITY): Payer: Self-pay

## 2024-04-25 LAB — COMPREHENSIVE METABOLIC PANEL WITH GFR
ALT: 43 U/L (ref 0–44)
AST: 41 U/L (ref 15–41)
Albumin: 2.9 g/dL — ABNORMAL LOW (ref 3.5–5.0)
Alkaline Phosphatase: 112 U/L (ref 38–126)
Anion gap: 13 (ref 5–15)
BUN: 17 mg/dL (ref 8–23)
CO2: 26 mmol/L (ref 22–32)
Calcium: 8.5 mg/dL — ABNORMAL LOW (ref 8.9–10.3)
Chloride: 97 mmol/L — ABNORMAL LOW (ref 98–111)
Creatinine, Ser: 1.12 mg/dL — ABNORMAL HIGH (ref 0.44–1.00)
GFR, Estimated: 46 mL/min — ABNORMAL LOW
Glucose, Bld: 120 mg/dL — ABNORMAL HIGH (ref 70–99)
Potassium: 3.6 mmol/L (ref 3.5–5.1)
Sodium: 135 mmol/L (ref 135–145)
Total Bilirubin: 0.6 mg/dL (ref 0.0–1.2)
Total Protein: 6.5 g/dL (ref 6.5–8.1)

## 2024-04-25 LAB — CBC
HCT: 30 % — ABNORMAL LOW (ref 36.0–46.0)
Hemoglobin: 10.2 g/dL — ABNORMAL LOW (ref 12.0–15.0)
MCH: 31.4 pg (ref 26.0–34.0)
MCHC: 34 g/dL (ref 30.0–36.0)
MCV: 92.3 fL (ref 80.0–100.0)
Platelets: 165 10*3/uL (ref 150–400)
RBC: 3.25 MIL/uL — ABNORMAL LOW (ref 3.87–5.11)
RDW: 14.3 % (ref 11.5–15.5)
WBC: 8.9 10*3/uL (ref 4.0–10.5)
nRBC: 0 % (ref 0.0–0.2)

## 2024-04-25 LAB — PRO BRAIN NATRIURETIC PEPTIDE: Pro Brain Natriuretic Peptide: 12831 pg/mL — ABNORMAL HIGH

## 2024-04-25 MED ORDER — FUROSEMIDE 20 MG PO TABS: 20.0000 mg | ORAL_TABLET | ORAL | Status: DC

## 2024-04-25 MED ORDER — AMOXICILLIN-POT CLAVULANATE 875-125 MG PO TABS
1.0000 | ORAL_TABLET | Freq: Two times a day (BID) | ORAL | 0 refills | Status: AC
  Filled 2024-04-25: qty 6, 3d supply, fill #0

## 2024-04-25 MED ORDER — ACETAMINOPHEN 325 MG PO TABS
650.0000 mg | ORAL_TABLET | Freq: Once | ORAL | Status: AC
  Administered 2024-04-25: 650 mg via ORAL
  Filled 2024-04-25: qty 2

## 2024-04-25 MED ORDER — DICLOFENAC SODIUM 1 % EX GEL
2.0000 g | Freq: Four times a day (QID) | CUTANEOUS | Status: DC | PRN
  Administered 2024-04-25 (×2): 2 g via TOPICAL
  Filled 2024-04-25: qty 100

## 2024-04-25 MED ORDER — DICLOFENAC SODIUM 1 % EX GEL
2.0000 g | Freq: Four times a day (QID) | CUTANEOUS | 0 refills | Status: AC | PRN
  Filled 2024-04-25: qty 100, 10d supply, fill #0

## 2024-04-25 MED ORDER — FUROSEMIDE 20 MG PO TABS
20.0000 mg | ORAL_TABLET | ORAL | 4 refills | Status: AC
  Filled 2024-04-25: qty 30, 70d supply, fill #0

## 2024-04-25 NOTE — TOC Transition Note (Signed)
 Transition of Care [facility]) - Discharge Note   Patient Details  Name: Kristine Burnett MRN: [id number] Date of Birth: 02-15-1930  [facility] CM/SW Contact:  Tawni CHRISTELLA Eva, LCSW Phone Number: 04/25/2024, 12:00 PM   Clinical Narrative:     CSW spoke with pt's daughter Asa to discuss rec for home health services. She has declined home home services, stating her mother does well when she leaves the hospital. She reports no DME needs at this time. Pt's daughter reports she will pick pt up around 2pm. No further ICM needs, ICM sign off.    Final next level of care: Home/Self Care Barriers to Discharge: Barriers Resolved   Patient Goals and CMS Choice Patient states their goals for this hospitalization and ongoing recovery are:: retrun home          Discharge Placement                    Patient and family notified of of transfer: 04/25/24  Discharge Plan and Services Additional resources added to the After Visit Summary for                                       Social Drivers of Health (SDOH) Interventions SDOH Screenings   Food Insecurity: No Food Insecurity (04/22/2024)  Housing: Low Risk (04/22/2024)  Transportation Needs: No Transportation Needs (04/22/2024)  Utilities: Not At Risk (04/22/2024)  Depression (PHQ2-9): Low Risk (08/08/2022)  Social Connections: Socially Isolated (04/22/2024)  Tobacco Use: Low Risk (04/22/2024)     Readmission Risk Interventions     No data to display

## 2024-04-25 NOTE — Evaluation (Signed)
 Physical Therapy Evaluation Patient Details Name: Kristine Burnett MRN: [id number] DOB: Aug 07, 1929 Today's Date: 04/25/2024  History of Present Illness  Pt is a 89 year old female admitted with Acute diastolic heart failure and Aspiration pneumonia CT chest shows possible aspirated material or mucous plugging seen in the right lower lobe bronchus. PMHx: CKD stage IIIb, type 2 diabetes, left breast cancer, status post tamoxifen , peripheral artery disease, spinal stenosis, hypertension, hyperlipidemia and asthma  Clinical Impression  Pt admitted with above diagnosis.  Pt currently with functional limitations due to the deficits listed below (see PT Problem List). Pt will benefit from acute skilled PT to increase their independence and safety with mobility to allow discharge.  Pt pleasant and anticipates possible d/c home today.  Pt states she woke up this morning feeling pain in her right thumb and left knee and believes it's weather related (snow).  Pt was able to ambulate yesterday in hallway with mobility specialist and today only able to tolerate a few feet to recliner due to pain.  Pt report she lives with her daughter however her daughter works.  Would recommend initial assist available for mobility due to pt's pain upon d/c however pt feels she can safely return home.  Pt would benefit from HHPT.         If plan is discharge home, recommend the following: A little help with walking and/or transfers;A little help with bathing/dressing/bathroom;Assistance with cooking/housework;Assist for transportation;Help with stairs or ramp for entrance   Can travel by private vehicle        Equipment Recommendations None recommended by PT  Recommendations for Other Services       Functional Status Assessment Patient has had a recent decline in their functional status and demonstrates the ability to make significant improvements in function in a reasonable and predictable amount of time.     Precautions /  Restrictions Precautions Precautions: Fall      Mobility  Bed Mobility Overal bed mobility: Needs Assistance Bed Mobility: Supine to Sit     Supine to sit: Supervision, HOB elevated     General bed mobility comments: increased time and effort    Transfers Overall transfer level: Needs assistance Equipment used: Rollator (4 wheels) Transfers: Sit to/from Stand Sit to Stand: Min assist           General transfer comment: cues for hand placement (R thumb also having pain today with RN aware) and light assist to rise and stabilize    Ambulation/Gait Ambulation/Gait assistance: Min assist Gait Distance (Feet): 5 Feet Assistive device: Rollator (4 wheels) Gait Pattern/deviations: Step-to pattern, Decreased stance time - left, Antalgic       General Gait Details: pain in left knee with weight bearing limiting distance today, assist for stability, SpO2 100% pre and post ambulation on room air  Stairs            Wheelchair Mobility     Tilt Bed    Modified Rankin (Stroke Patients Only)       Balance Overall balance assessment:  (denies falls)                                           Pertinent Vitals/Pain Pain Assessment Pain Assessment: 0-10 Pain Score: 10-Worst pain ever Pain Location: left knee with weight bearing Pain Descriptors / Indicators: Tender, Sore Pain Intervention(s): Repositioned, Monitored during session, Limited activity within patient's  tolerance    Home Living Family/patient expects to be discharged to:: Private residence Living Arrangements: Children Available Help at Discharge: Family;Available PRN/intermittently Type of Home: House Home Access: Stairs to enter Entrance Stairs-Rails: Right Entrance Stairs-Number of Steps: 2   Home Layout: One level Home Equipment: Rollator (4 wheels) Additional Comments: lives with daughter who works    Prior Function Prior Level of Function : Independent/Modified  Independent             Mobility Comments: uses rollator       Extremity/Trunk Assessment        Lower Extremity Assessment Lower Extremity Assessment: Generalized weakness;LLE deficits/detail LLE Deficits / Details: reports pain in left knee since earlier this morning (thinks it's weather related) and RN aware    Cervical / Trunk Assessment Cervical / Trunk Assessment: Kyphotic  Communication   Communication Communication: Impaired Factors Affecting Communication: Hearing impaired    Cognition Arousal: Alert Behavior During Therapy: WFL for tasks assessed/performed   PT - Cognitive impairments: No apparent impairments                         Following commands: Intact       Cueing       General Comments      Exercises     Assessment/Plan    PT Assessment Patient needs continued PT services  PT Problem List         PT Treatment Interventions Gait training;DME instruction;Therapeutic exercise;Balance training;Functional mobility training;Therapeutic activities;Patient/family education;Stair training    PT Goals (Current goals can be found in the Care Plan section)  Acute Rehab PT Goals PT Goal Formulation: With patient Time For Goal Achievement: 05/09/24 Potential to Achieve Goals: Good    Frequency Min 3X/week     Co-evaluation               AM-PAC PT 6 Clicks Mobility  Outcome Measure Help needed turning from your back to your side while in a flat bed without using bedrails?: A Little Help needed moving from lying on your back to sitting on the side of a flat bed without using bedrails?: A Lot Help needed moving to and from a bed to a chair (including a wheelchair)?: A Lot Help needed standing up from a chair using your arms (e.g., wheelchair or bedside chair)?: A Lot Help needed to walk in hospital room?: A Lot Help needed climbing 3-5 steps with a railing? : A Lot 6 Click Score: 13    End of Session Equipment Utilized  During Treatment: Gait belt Activity Tolerance: Patient limited by pain Patient left: in chair;with call bell/phone within reach;with chair alarm set Nurse Communication: Mobility status PT Visit Diagnosis: Difficulty in walking, not elsewhere classified (R26.2);Pain Pain - Right/Left: Left Pain - part of body: Knee    Time: 8974-8951 PT Time Calculation (min) (ACUTE ONLY): 23 min   Charges:   PT Evaluation $PT Eval Low Complexity: 1 Low PT Treatments $Gait Training: 8-22 mins PT General Charges $$ ACUTE PT VISIT: 1 Visit       Tari PT, DPT Physical Therapist Acute Rehabilitation Services Office: [phone]   Tari CROME Payson 04/25/2024, 11:42 AM

## 2024-04-25 NOTE — Progress Notes (Signed)
 "  Rounding Note   Patient Name: Kristine Burnett Date of Encounter: 04/25/2024  [facility] Cardiologist: Georganna Archer, MD   Subjective - No acute events overnight - Patient states that she feels really well besides some left knee pain  Scheduled Meds:  amLODipine   10 mg Oral Daily   azithromycin   500 mg Oral Daily   [START ON 04/26/2024] furosemide   20 mg Oral Q M,W,F   heparin   5,000 Units Subcutaneous Q8H   irbesartan   150 mg Oral Daily   sodium chloride  HYPERTONIC  4 mL Nebulization Daily   tamoxifen   20 mg Oral Daily   Continuous Infusions:  cefTRIAXone  (ROCEPHIN )  IV 1 g (04/24/24 1525)   PRN Meds: albuterol , alum & mag hydroxide-simeth, diclofenac  Sodium, ondansetron  **OR** ondansetron  (ZOFRAN ) IV, traZODone    Vital Signs  Vitals:   04/24/24 1537 04/24/24 2007 04/25/24 0428 04/25/24 0802  BP:  (!) 176/83 (!) 157/81   Pulse:  89 90   Resp:  18 17   Temp:  97.8 F (36.6 C) 98.4 F (36.9 C)   TempSrc:  Oral    SpO2: 96% 98% 94% 97%  Weight:      Height:        Intake/Output Summary (Last 24 hours) at 04/25/2024 1044 Last data filed at 04/25/2024 0428 Gross per 24 hour  Intake 240 ml  Output 1750 ml  Net -1510 ml      04/23/2024    5:00 AM 04/22/2024    8:09 PM 10/15/2023    6:45 AM  Last 3 Weights  Weight (lbs) 164 lb 3.9 oz 168 lb 6.9 oz 167 lb  Weight (kg) 74.5 kg 76.4 kg 75.751 kg      Telemetry NSR, rare PVCs- Personally Reviewed  ECG  No new ECG  Physical Exam  GEN: No acute distress.   Neck: No JVD Cardiac: Regular rate with occasional ectopy, no murmurs, rubs, or gallops.  Respiratory: Faint rales in LLL base that is improving, no wheezes or rhonchi GI: Soft, nontender, non-distended  MS: No edema; No deformity. Neuro:  Nonfocal  Psych: Normal affect   Labs High Sensitivity Troponin:  No results for input(s): TROPONINIHS in the last 720 hours.  Recent Labs  Lab 04/22/24 1012 04/22/24 1141  TRNPT 39* 34*        Chemistry Recent Labs  Lab 04/23/24 0404 04/24/24 0422 04/25/24 0425  NA 140 135 135  K 3.9 3.4* 3.6  CL 103 97* 97*  CO2 27 27 26   GLUCOSE 170* 108* 120*  BUN 24* 16 17  CREATININE 1.07* 0.93 1.12*  CALCIUM  8.9 8.8* 8.5*  PROT  --  6.7 6.5  ALBUMIN   --  2.8* 2.9*  AST  --  91* 41  ALT  --  68* 43  ALKPHOS  --  131* 112  BILITOT  --  1.3* 0.6  GFRNONAA 48* 57* 46*  ANIONGAP 9 11 13     Lipids  Recent Labs  Lab 04/23/24 1024  CHOL 152  TRIG 71  HDL 55  LDLCALC 83  CHOLHDL 2.8    Hematology Recent Labs  Lab 04/23/24 0404 04/24/24 0422 04/25/24 0425  WBC 13.6* 12.2* 8.9  RBC 2.95* 3.13* 3.25*  HGB 9.2* 9.9* 10.2*  HCT 28.1* 29.5* 30.0*  MCV 95.3 94.2 92.3  MCH 31.2 31.6 31.4  MCHC 32.7 33.6 34.0  RDW 14.6 14.6 14.3  PLT 178 179 165   Thyroid No results for input(s): TSH, FREET4 in the  last 168 hours.  BNP Recent Labs  Lab 04/22/24 1012 04/23/24 0404  PROBNP 2,510.0* 7,243.0*    DDimer  Recent Labs  Lab 04/23/24 1024  DDIMER 1.76*     Radiology  DG Knee 1-2 Views Left Result Date: 04/25/2024 CLINICAL DATA:  Left knee pain EXAM: LEFT KNEE - 1-2 VIEW COMPARISON:  None Available. FINDINGS: No fracture or dislocation is noted. Probable small suprapatellar joint effusion is noted. Moderate degenerative changes seen involving lateral joint space. IMPRESSION: Moderate degenerative change as noted above. Probable small suprapatellar joint effusion. No fracture or dislocation. Electronically Signed   By: Lynwood Landy Raddle M.D.   On: 04/25/2024 08:30   CT Angio Chest Pulmonary Embolism (PE) W or WO Contrast Result Date: 04/23/2024 CLINICAL DATA:  Positive D-dimer, shortness of breath EXAM: CT ANGIOGRAPHY CHEST WITH CONTRAST TECHNIQUE: Multidetector CT imaging of the chest was performed using the standard protocol during bolus administration of intravenous contrast. Multiplanar CT image reconstructions and MIPs were obtained to evaluate the vascular anatomy.  RADIATION DOSE REDUCTION: This exam was performed according to the departmental dose-optimization program which includes automated exposure control, adjustment of the mA and/or kV according to patient size and/or use of iterative reconstruction technique. CONTRAST:  60mL OMNIPAQUE  IOHEXOL  350 MG/ML SOLN COMPARISON:  None Available. FINDINGS: Cardiovascular: Satisfactory opacification of the pulmonary arteries to the segmental level. No evidence of pulmonary embolism. Mild cardiomegaly. Mild coronary artery calcifications are noted. No pericardial effusion. Mediastinum/Nodes: No enlarged mediastinal, hilar, or axillary lymph nodes. Thyroid gland, trachea, and esophagus demonstrate no significant findings. Lungs/Pleura: Minimal bilateral pleural effusions are noted with adjacent subsegmental atelectasis. No pneumothorax is noted. 4 mm nodule is noted in right upper lobe best seen on image number 34 series 6. Possible aspirated material or mucous plugging seen in right lower lobe bronchus. Upper Abdomen: No acute abnormality. Musculoskeletal: No chest wall abnormality. No acute or significant osseous findings. Review of the MIP images confirms the above findings. IMPRESSION: 1. No definite evidence of pulmonary embolus. 2. Mild coronary artery calcifications are noted. 3. Minimal bilateral pleural effusions are noted with adjacent subsegmental atelectasis. 4. 4 mm nodule is noted in right upper lobe. 5. Possible aspirated material or mucous plugging seen in right lower lobe bronchus. Electronically Signed   By: Lynwood Landy Raddle M.D.   On: 04/23/2024 14:10    Cardiac Studies  TTE 04/22/24:  IMPRESSIONS     1. Left ventricular ejection fraction, by estimation, is 50 to 55%. The  left ventricle has low normal function. The left ventricle has no regional  wall motion abnormalities. Left ventricular diastolic parameters are  consistent with Grade I diastolic  dysfunction (impaired relaxation).   2. Right  ventricular systolic function is normal. The right ventricular  size is normal. There is moderately elevated pulmonary artery systolic  pressure.   3. Left atrial size was mildly dilated.   4. The mitral valve is normal in structure. Mild to moderate mitral valve  regurgitation.   5. The aortic valve is tricuspid. Aortic valve regurgitation is trivial.  Aortic valve sclerosis/calcification is present, without any evidence of  aortic stenosis.   6. The inferior vena cava is normal in size with <50% respiratory  variability, suggesting right atrial pressure of 8 mmHg.   Patient Profile   Kristine Burnett is a 89 y.o. female with a hx of hypertension, hyperlipidemia, asthma, recurrent appendicitis s/p appendectomy 2016, hepatitis, CKD, diabetes type 2, left breast cancer diagnosed February 2022 underwent tamoxifen  and  tolerated well, osteopenia, PAD, spinal stenosis, who is being seen 04/23/2024 for the evaluation of acute CHF at the request of Dr. Will.   Assessment & Plan   #Acute HFpEF Exacerbation - Patient presented with 3 days of shortness of breath when laying flat and wheezing. - CXR concerning for pulmonary edema and proBNP elevated. - CT PE negative for PE, but concerning for possible aspiration material in the right lower lobe bronchus. - The patient has an elevated E/e' on TTE despite having preserved LV systolic function suggesting elevated pulmonary capillary wedge pressure. - Currently she appears to be euvolemic after diuresis and is doing very well.  I think she is okay to discharge. Change Lasix  to Lasix  20 mg MWF to prevent dehydration   #PNA Treatment per primary    [facility] will sign off.   The patient is ready for discharge today from a cardiac standpoint. Medication Recommendations: Start Lasix  20 mg every Monday, Wednesday, Friday; continue home antihypertensives Other recommendations (labs, testing, etc): Outpatient BMP Follow up as an outpatient:  Cardiology will arrange cardiology follow-up. For questions or updates, please contact [facility] Please consult [email or web address] for contact info under       Signed, Georganna Archer, MD  04/25/2024, 10:44 AM    "

## 2024-04-25 NOTE — Evaluation (Signed)
 Clinical/Bedside Swallow Evaluation Patient Details  Name: Kristine Burnett MRN: [id number] Date of Birth: Mar 24, 1929  Today's Date: 04/25/2024 Time: SLP Start Time (ACUTE ONLY): 0841 SLP Stop Time (ACUTE ONLY): 0900 SLP Time Calculation (min) (ACUTE ONLY): 19 min  Past Medical History:  Past Medical History:  Diagnosis Date   Abnormal EKG    Asthma    Carotid artery occlusion    DDD (degenerative disc disease)    Diabetes mellitus without complication (HCC)    Family history of breast cancer    Family history of GI tract cancer    Family history of prostate cancer    Hyperlipidemia    Hypertension    PAD (peripheral artery disease)    Right lower quadrant abdominal abscess (HCC)    SBO (small bowel obstruction) (HCC) 03/15/2014   Spinal stenosis    Type II or unspecified type diabetes mellitus without mention of complication, not stated as uncontrolled 03/22/2013   Vitamin D  deficiency    Past Surgical History:  Past Surgical History:  Procedure Laterality Date   BREAST LUMPECTOMY Left 06/17/2020   BREAST LUMPECTOMY WITH RADIOACTIVE SEED LOCALIZATION Left 06/17/2020   Procedure: LEFT BREAST LUMPECTOMY WITH RADIOACTIVE SEED LOCALIZATION;  Surgeon: Curvin Deward MOULD, MD;  Location: [facility];  Service: General;  Laterality: Left;   CHOLECYSTECTOMY     CYST EXCISION  back   ERCP N/A 10/09/2021   Procedure: ENDOSCOPIC RETROGRADE CHOLANGIOPANCREATOGRAPHY (ERCP);  Surgeon: Aneita Gwendlyn DASEN, MD;  Location: [facility];  Service: Gastroenterology;  Laterality: N/A;   HERNIA REPAIR     LAPAROSCOPIC APPENDECTOMY N/A 11/15/2014   Procedure: APPENDECTOMY LAPAROSCOPIC;  Surgeon: Elspeth Schultze, MD;  Location: [facility];  Service: General;  Laterality: N/A;   MINI-LAPAROTOMY W/ TUBAL LIGATION  03/21/1962   REMOVAL OF STONES  10/09/2021   Procedure: REMOVAL OF STONES;  Surgeon: Aneita Gwendlyn DASEN, MD;  Location: [facility];  Service: Gastroenterology;;   ANNETT  10/09/2021    Procedure: SPHINCTEROTOMY;  Surgeon: Aneita Gwendlyn DASEN, MD;  Location: [facility];  Service: Gastroenterology;;   TUBAL LIGATION     HPI:  This is a 89 year old female with history of CKD stage IIIb, type 2 diabetes, left breast cancer, status post tamoxifen , peripheral artery disease spinal stenosis hypertension hyperlipidemia and asthma admitted with shortness of breath which started few days prior to admission, orthopnea and dyspnea on exertion.  Dx with acute diastolic heart failure, aspiration PNA with CT of the chest showing possible aspirated material or mucous plugging in the RLL.    Assessment / Plan / Recommendation  Clinical Impression  Patient present with normal oropharyngeal swallowing function based on bedside exam. She is able to self feed and is cognitively intact. Initial CXR on admission showed mild pulmonary vascular congestion, strandy opacities at the lung bases likely combination of atelectasis and pulmonary edema. F/u CT chest showed possible aspirates. Note that patient with an episode of what she described as significant emesis on the day of admission. Given that oropharyngeal swallowing function appears completely normal based on bedside results, question if patient could have aspirated during this episode to result in CT scan findings the next date. Patient does endorse GER which could also result in episodic aspiration however she states that this is only occassional and is relieved with Tums. Given no other h/o PNA, recommend continuation of current diet with general safe swallowing strategies. No f/u indicated from SLP standpoint however if MD feels needed to r/o silent aspiration, MBS can be completed.  MD please order MBS if you feel needed. SLP Visit Diagnosis: Dysphagia, unspecified (R13.10)          Other Recommendations Oral Care Recommendations: Oral care BID     Swallow Evaluation Recommendations Recommendations: PO diet PO Diet Recommendation: Regular;Thin  liquids (Level 0) Liquid Administration via: Cup;Straw Medication Administration: Whole meds with liquid Supervision: Patient able to self-feed Swallowing strategies  : Slow rate;Small bites/sips Postural changes: Position pt fully upright for meals;Stay upright 30-60 min after meals Oral care recommendations: Oral care BID (2x/day)      Functional Status Assessment Patient has not had a recent decline in their functional status    Swallow Study   General HPI: This is a 89 year old female with history of CKD stage IIIb, type 2 diabetes, left breast cancer, status post tamoxifen , peripheral artery disease spinal stenosis hypertension hyperlipidemia and asthma admitted with shortness of breath which started few days prior to admission, orthopnea and dyspnea on exertion.  Dx with acute diastolic heart failure, aspiration PNA with CT of the chest showing possible aspirated material or mucous plugging in the RLL. Type of Study: Bedside Swallow Evaluation Previous Swallow Assessment: none in chart Diet Prior to this Study: Regular;Thin liquids (Level 0) Temperature Spikes Noted: No Respiratory Status: Room air History of Recent Intubation: No Behavior/Cognition: Alert;Cooperative;Pleasant mood Oral Cavity Assessment: Within Functional Limits Oral Care Completed by SLP: No Oral Cavity - Dentition: Dentures, top;Dentures, bottom Vision: Functional for self-feeding Self-Feeding Abilities: Able to feed self Patient Positioning: Upright in bed Baseline Vocal Quality: Normal Volitional Cough: Strong Volitional Swallow: Able to elicit    Oral/Motor/Sensory Function Overall Oral Motor/Sensory Function: Within functional limits   Ice Chips Ice chips: Not tested   Thin Liquid Thin Liquid: Within functional limits Presentation: Self Fed;Straw    Nectar Thick Nectar Thick Liquid: Not tested   Honey Thick Honey Thick Liquid: Not tested   Puree Puree: Within functional limits Presentation: Self  Fed;Spoon   Solid     Solid: Within functional limits Presentation: Self Fed     Avleen Bordwell MA, CCC-SLP  Tanairy Payeur Meryl 04/25/2024,9:39 AM

## 2024-04-26 NOTE — Discharge Summary (Incomplete)
 Physician Discharge Summary  Kristine Burnett FMW:[id number] DOB: [birth year] DOA: 04/22/2024  PCP: Theo Iha, MD  Admit date: 04/22/2024 Discharge date:04/25/2024 Admitted From: ***(Home, ALF, ILF, SNF) Disposition:  ***(Home /***facility name / Residential Hospice)  Recommendations for Outpatient Follow-up:  Follow up with PCP in 1-2 weeks Please obtain BMP/CBC in one week Please follow up on the following pending results:  Home Health:*** (YES/NO) Equipment/Devices:*** (oxygen ?L, foley, PICC line--date placed, wound care, Pleurx)  Discharge Condition:*** (Stable, guarded, hospice) CODE STATUS:*** (FULL, DNR, Comfort Care) Diet recommendation: Heart Healthy / Carb Modified / Regular / Dysphagia   Brief/Interim Summary: You may copy/paste interim summary or write brief hospital course depending on length of stay  Discharge Diagnoses:  Principal Problem:   Acute diastolic (congestive) heart failure (HCC) Active Problems:   Diastolic dysfunction   Pneumonia of left lower lobe due to infectious organism                      Estimated body mass index is 25.72 kg/m as calculated from the following:   Height as of this encounter: 5' 7 (1.702 m).   Weight as of this encounter: 74.5 kg.  Discharge Instructions  Discharge Instructions     Increase activity slowly   Complete by: As directed       Allergies as of 04/25/2024       Reactions   Ace Inhibitors Other (See Comments), Cough   Enalapril  OK   Metformin  And Related Diarrhea   Penicillins Other (See Comments)   Reaction not recalled   Grapefruit Extract Rash   Minoxidil Other (See Comments)   Grew hair on face        Medication List     STOP taking these medications    cilostazol  50 MG tablet Commonly known as: PLETAL    diltiazem  120 MG tablet Commonly known as: CARDIZEM    valsartan -hydrochlorothiazide  160-25 MG tablet Commonly known as: DIOVAN -HCT       TAKE these medications     acetaminophen  650 MG CR tablet Commonly known as: TYLENOL  Take 650 mg by mouth See admin instructions. Take 650 mg by mouth in the morning & evening and an additional 650 mg during the day as needed for pain   amLODipine  10 MG tablet Commonly known as: NORVASC  Take 10 mg by mouth daily.   amoxicillin -clavulanate 875-125 MG tablet Commonly known as: AUGMENTIN  Take 1 tablet by mouth 2 (two) times daily.   atenolol  100 MG tablet Commonly known as: TENORMIN  TAKE ONE TABLET BY MOUTH DAILY *PLEASE SCHEDULE F/U APPOINTMENT TO OBTAIN FURTHER REFILLS* What changed: See the new instructions.   BERBERINE HCI PO Take 1 capsule by mouth daily.   Coenzyme Q10 200 MG capsule Take 200 mg by mouth daily.   diclofenac  Sodium 1 % Gel Commonly known as: VOLTAREN  Apply 2 g topically 4 (four) times daily as needed (joint pain).   Flaxseed Oil 1000 MG Caps Take 2,000 mg by mouth 2 (two) times daily.   furosemide  20 MG tablet Commonly known as: LASIX  Take 1 tablet (20 mg total) by mouth every Monday, Wednesday, and Friday.   Magnesium  250 MG Tabs Take 250 mg by mouth every evening.   MULTIVITAMIN ADULT PO Take 1 tablet by mouth daily at 6 (six) AM.   Myrbetriq 25 MG Tb24 tablet Generic drug: mirabegron ER Take 25 mg by mouth daily.   RED YEAST RICE PO Take 1 capsule by mouth daily.   tamoxifen  20 MG tablet  Commonly known as: NOLVADEX  Take 1 tablet (20 mg total) by mouth daily.   valsartan  160 MG tablet Commonly known as: DIOVAN  Take 160 mg by mouth daily.   VITAMIN B 12 PO Take 5,000 mcg by mouth daily.   Vitamin C  500 MG Caps Take 500 mg by mouth daily.   Vitamin D -3 125 MCG (5000 UT) Tabs Take 5,000 Units by mouth See admin instructions. Friday, Saturday, Sunday        Follow-up Information     Theo Iha, MD. Call in 1 week(s).   Specialty: Internal Medicine Contact information: [address] [place] [address] [phone]                 Allergies[1]  Consultations: ***Specify Physician/Group   Procedures/Studies: DG Knee 1-2 Views Left Result Date: 04/25/2024 CLINICAL DATA:  Left knee pain EXAM: LEFT KNEE - 1-2 VIEW COMPARISON:  None Available. FINDINGS: No fracture or dislocation is noted. Probable small suprapatellar joint effusion is noted. Moderate degenerative changes seen involving lateral joint space. IMPRESSION: Moderate degenerative change as noted above. Probable small suprapatellar joint effusion. No fracture or dislocation. Electronically Signed   By: Lynwood Landy Raddle M.D.   On: 04/25/2024 08:30   CT Angio Chest Pulmonary Embolism (PE) W or WO Contrast Result Date: 04/23/2024 CLINICAL DATA:  Positive D-dimer, shortness of breath EXAM: CT ANGIOGRAPHY CHEST WITH CONTRAST TECHNIQUE: Multidetector CT imaging of the chest was performed using the standard protocol during bolus administration of intravenous contrast. Multiplanar CT image reconstructions and MIPs were obtained to evaluate the vascular anatomy. RADIATION DOSE REDUCTION: This exam was performed according to the departmental dose-optimization program which includes automated exposure control, adjustment of the mA and/or kV according to patient size and/or use of iterative reconstruction technique. CONTRAST:  60mL OMNIPAQUE  IOHEXOL  350 MG/ML SOLN COMPARISON:  None Available. FINDINGS: Cardiovascular: Satisfactory opacification of the pulmonary arteries to the segmental level. No evidence of pulmonary embolism. Mild cardiomegaly. Mild coronary artery calcifications are noted. No pericardial effusion. Mediastinum/Nodes: No enlarged mediastinal, hilar, or axillary lymph nodes. Thyroid gland, trachea, and esophagus demonstrate no significant findings. Lungs/Pleura: Minimal bilateral pleural effusions are noted with adjacent subsegmental atelectasis. No pneumothorax is noted. 4 mm nodule is noted in right upper lobe best seen on image number 34 series 6.  Possible aspirated material or mucous plugging seen in right lower lobe bronchus. Upper Abdomen: No acute abnormality. Musculoskeletal: No chest wall abnormality. No acute or significant osseous findings. Review of the MIP images confirms the above findings. IMPRESSION: 1. No definite evidence of pulmonary embolus. 2. Mild coronary artery calcifications are noted. 3. Minimal bilateral pleural effusions are noted with adjacent subsegmental atelectasis. 4. 4 mm nodule is noted in right upper lobe. 5. Possible aspirated material or mucous plugging seen in right lower lobe bronchus. Electronically Signed   By: Lynwood Landy Raddle M.D.   On: 04/23/2024 14:10   DG Abd Portable 1V Result Date: 04/22/2024 CLINICAL DATA:  Abdominal pain, vomiting EXAM: PORTABLE ABDOMEN - 1 VIEW COMPARISON:  03/21/2014 FINDINGS: Single frontal view of the abdomen demonstrates an unremarkable bowel gas pattern. No significant fecal retention. No masses or abnormal calcifications. Lung bases are clear. IMPRESSION: 1. Unremarkable bowel gas pattern. Electronically Signed   By: Ozell Daring M.D.   On: 04/22/2024 22:42   ECHOCARDIOGRAM COMPLETE Result Date: 04/22/2024    ECHOCARDIOGRAM REPORT   Patient Name:   Kristine Burnett Date of Exam: 04/22/2024 Medical Rec #:  [id number]  Height:       68.0 in Accession #:    [id number]    Weight:       167.0 lb Date of Birth:  1929/06/18      BSA:          1.893 m Patient Age:    89 years      BP:           162/80 mmHg Patient Gender: F             HR:           77 bpm. Exam Location:  Inpatient Procedure: 2D Echo, Cardiac Doppler and Color Doppler (Both Spectral and Color            Flow Doppler were utilized during procedure). Indications:    CHF-Acute Diastolic I50.31  History:        Patient has no prior history of Echocardiogram examinations.                 Risk Factors:Diabetes, Hypertension and Dyslipidemia.  Sonographer:    Tinnie Gosling RDCS Referring Phys: [id number] MIR M [facility] IMPRESSIONS  1.  Left ventricular ejection fraction, by estimation, is 50 to 55%. The left ventricle has low normal function. The left ventricle has no regional wall motion abnormalities. Left ventricular diastolic parameters are consistent with Grade I diastolic dysfunction (impaired relaxation).  2. Right ventricular systolic function is normal. The right ventricular size is normal. There is moderately elevated pulmonary artery systolic pressure.  3. Left atrial size was mildly dilated.  4. The mitral valve is normal in structure. Mild to moderate mitral valve regurgitation.  5. The aortic valve is tricuspid. Aortic valve regurgitation is trivial. Aortic valve sclerosis/calcification is present, without any evidence of aortic stenosis.  6. The inferior vena cava is normal in size with <50% respiratory variability, suggesting right atrial pressure of 8 mmHg. FINDINGS  Left Ventricle: Left ventricular ejection fraction, by estimation, is 50 to 55%. The left ventricle has low normal function. The left ventricle has no regional wall motion abnormalities. The left ventricular internal cavity size was normal in size. There is no left ventricular hypertrophy. Left ventricular diastolic parameters are consistent with Grade I diastolic dysfunction (impaired relaxation). Right Ventricle: The right ventricular size is normal. Right vetricular wall thickness was not assessed. Right ventricular systolic function is normal. There is moderately elevated pulmonary artery systolic pressure. The tricuspid regurgitant velocity is  3.19 m/s, and with an assumed right atrial pressure of 8 mmHg, the estimated right ventricular systolic pressure is 48.7 mmHg. Left Atrium: Left atrial size was mildly dilated. Right Atrium: Right atrial size was normal in size. Pericardium: There is no evidence of pericardial effusion. Mitral Valve: The mitral valve is normal in structure. There is moderate calcification of the posterior mitral valve leaflet(s). Mildly  decreased mobility of the mitral valve leaflets. Mild to moderate mitral valve regurgitation. Tricuspid Valve: The tricuspid valve is normal in structure. Tricuspid valve regurgitation is mild. There is mild prolapse of the tricuspid anterior leaflet. Aortic Valve: The aortic valve is tricuspid. Aortic valve regurgitation is trivial. Aortic valve sclerosis/calcification is present, without any evidence of aortic stenosis. Aortic valve mean gradient measures 6.0 mmHg. Aortic valve peak gradient measures 10.5 mmHg. Aortic valve area, by VTI measures 2.15 cm. Pulmonic Valve: The pulmonic valve was not well visualized. Pulmonic valve regurgitation is trivial. Aorta: The aortic root, ascending aorta, aortic arch and descending aorta are all structurally normal, with no evidence  of dilitation or obstruction. Venous: The inferior vena cava is normal in size with less than 50% respiratory variability, suggesting right atrial pressure of 8 mmHg. IAS/Shunts: No atrial level shunt detected by color flow Doppler.  LEFT VENTRICLE PLAX 2D LVIDd:         4.60 cm     Diastology LVIDs:         3.50 cm     LV e' medial:    5.22 cm/s LV PW:         1.20 cm     LV E/e' medial:  25.1 LV IVS:        1.20 cm     LV e' lateral:   8.81 cm/s LVOT diam:     2.00 cm     LV E/e' lateral: 14.9 LV SV:         85 LV SV Index:   45 LVOT Area:     3.14 cm LV IVRT:       116 msec  LV Volumes (MOD) LV vol d, MOD A2C: 91.7 ml LV vol d, MOD A4C: 75.9 ml LV vol s, MOD A2C: 40.9 ml LV vol s, MOD A4C: 38.1 ml LV SV MOD A2C:     50.8 ml LV SV MOD A4C:     75.9 ml LV SV MOD BP:      41.3 ml RIGHT VENTRICLE             IVC RV S prime:     13.40 cm/s  IVC diam: 1.80 cm LEFT ATRIUM             Index        RIGHT ATRIUM           Index LA diam:        4.10 cm 2.17 cm/m   RA Area:     17.20 cm LA Vol (A2C):   52.2 ml 27.55 ml/m  RA Volume:   49.80 ml  26.31 ml/m LA Vol (A4C):   64.6 ml 34.13 ml/m LA Biplane Vol: 63.1 ml 33.33 ml/m  AORTIC VALVE AV Area  (Vmax):    2.19 cm AV Area (Vmean):   2.24 cm AV Area (VTI):     2.15 cm AV Vmax:           162.00 cm/s AV Vmean:          113.000 cm/s AV VTI:            0.393 m AV Peak Grad:      10.5 mmHg AV Mean Grad:      6.0 mmHg LVOT Vmax:         113.00 cm/s LVOT Vmean:        80.700 cm/s LVOT VTI:          0.269 m LVOT/AV VTI ratio: 0.68  AORTA Ao Root diam: 2.60 cm Ao Asc diam:  3.20 cm MITRAL VALVE                TRICUSPID VALVE MV Area (PHT): 3.86 cm     TR Peak grad:   40.7 mmHg MV E velocity: 131.00 cm/s  TR Vmax:        319.00 cm/s MV A velocity: 108.00 cm/s MV E/A ratio:  1.21         SHUNTS  Systemic VTI:  0.27 m                             Systemic Diam: 2.00 cm Morene Brownie Electronically signed by Morene Brownie Signature Date/Time: 04/22/2024/3:06:59 PM    Final    DG Chest 2 View Result Date: 04/22/2024 CLINICAL DATA:  Shortness of breath. EXAM: CHEST - 2 VIEW COMPARISON:  10/05/2021 FINDINGS: Heart is mildly enlarged. Mild pulmonary vascular congestion. Strandy opacities at the lung bases likely combination of atelectasis and pulmonary edema. IMPRESSION: Mild cardiomegaly and pulmonary vascular congestion most consistent with CHF/fluid volume overload. Electronically Signed   By: Aliene Lloyd M.D.   On: 04/22/2024 10:05   (Echo, Carotid, EGD, Colonoscopy, ERCP)    Subjective:   Discharge Exam: Vitals:   04/25/24 0428 04/25/24 0802  BP: (!) 157/81   Pulse: 90   Resp: 17   Temp: 98.4 F (36.9 C)   SpO2: 94% 97%   Vitals:   04/24/24 1537 04/24/24 2007 04/25/24 0428 04/25/24 0802  BP:  (!) 176/83 (!) 157/81   Pulse:  89 90   Resp:  18 17   Temp:  97.8 F (36.6 C) 98.4 F (36.9 C)   TempSrc:  Oral    SpO2: 96% 98% 94% 97%  Weight:      Height:        General: Pt is alert, awake, not in acute distress Cardiovascular: RRR, S1/S2 +, no rubs, no gallops Respiratory: CTA bilaterally, no wheezing, no rhonchi Abdominal: Soft, NT, ND, bowel sounds  + Extremities: no edema, no cyanosis    The results of significant diagnostics from this hospitalization (including imaging, microbiology, ancillary and laboratory) are listed below for reference.     Microbiology: Recent Results (from the past 240 hours)  Resp panel by RT-PCR (RSV, Flu A&B, Covid) Anterior Nasal Swab     Status: None   Collection Time: 04/22/24 11:15 PM   Specimen: Anterior Nasal Swab  Result Value Ref Range Status   SARS Coronavirus 2 by RT PCR NEGATIVE NEGATIVE Final    Comment: (NOTE) SARS-CoV-2 target nucleic acids are NOT DETECTED.  The SARS-CoV-2 RNA is generally detectable in upper respiratory specimens during the acute phase of infection. The lowest concentration of SARS-CoV-2 viral copies this assay can detect is 138 copies/mL. A negative result does not preclude SARS-Cov-2 infection and should not be used as the sole basis for treatment or other patient management decisions. A negative result may occur with  improper specimen collection/handling, submission of specimen other than nasopharyngeal swab, presence of viral mutation(s) within the areas targeted by this assay, and inadequate number of viral copies(<138 copies/mL). A negative result must be combined with clinical observations, patient history, and epidemiological information. The expected result is Negative.  Fact Sheet for Patients:  bloggercourse.com  Fact Sheet for Healthcare Providers:  seriousbroker.it  This test is no t yet approved or cleared by the [place]  FDA and  has been authorized for detection and/or diagnosis of SARS-CoV-2 by FDA under an Emergency Use Authorization (EUA). This EUA will remain  in effect (meaning this test can be used) for the duration of the COVID-19 declaration under Section 564(b)(1) of the Act, 21 U.S.C.section 360bbb-3(b)(1), unless the authorization is terminated  or revoked sooner.        Influenza A by PCR NEGATIVE NEGATIVE Final   Influenza B by PCR NEGATIVE NEGATIVE Final    Comment: (NOTE) The Xpert  Xpress SARS-CoV-2/FLU/RSV plus assay is intended as an aid in the diagnosis of influenza from Nasopharyngeal swab specimens and should not be used as a sole basis for treatment. Nasal washings and aspirates are unacceptable for Xpert Xpress SARS-CoV-2/FLU/RSV testing.  Fact Sheet for Patients: bloggercourse.com  Fact Sheet for Healthcare Providers: seriousbroker.it  This test is not yet approved or cleared by the [place]  FDA and has been authorized for detection and/or diagnosis of SARS-CoV-2 by FDA under an Emergency Use Authorization (EUA). This EUA will remain in effect (meaning this test can be used) for the duration of the COVID-19 declaration under Section 564(b)(1) of the Act, 21 U.S.C. section 360bbb-3(b)(1), unless the authorization is terminated or revoked.     Resp Syncytial Virus by PCR NEGATIVE NEGATIVE Final    Comment: (NOTE) Fact Sheet for Patients: bloggercourse.com  Fact Sheet for Healthcare Providers: seriousbroker.it  This test is not yet approved or cleared by the [place]  FDA and has been authorized for detection and/or diagnosis of SARS-CoV-2 by FDA under an Emergency Use Authorization (EUA). This EUA will remain in effect (meaning this test can be used) for the duration of the COVID-19 declaration under Section 564(b)(1) of the Act, 21 U.S.C. section 360bbb-3(b)(1), unless the authorization is terminated or revoked.  Performed at [facility], [address], [place], [place] [address]      Labs: BNP (last 3 results) No results for input(s): BNP in the last 8760 hours. Basic Metabolic Panel: Recent Labs  Lab 04/22/24 1011 04/23/24 0404 04/24/24 0422 04/25/24 0425  NA 139 140 135 135  K 4.4 3.9  3.4* 3.6  CL 102 103 97* 97*  CO2 27 27 27 26   GLUCOSE 185* 170* 108* 120*  BUN 25* 24* 16 17  CREATININE 1.18* 1.07* 0.93 1.12*  CALCIUM  9.3 8.9 8.8* 8.5*   Liver Function Tests: Recent Labs  Lab 04/24/24 0422 04/25/24 0425  AST 91* 41  ALT 68* 43  ALKPHOS 131* 112  BILITOT 1.3* 0.6  PROT 6.7 6.5  ALBUMIN  2.8* 2.9*   No results for input(s): LIPASE, AMYLASE in the last 168 hours. No results for input(s): AMMONIA in the last 168 hours. CBC: Recent Labs  Lab 04/22/24 1011 04/23/24 0404 04/24/24 0422 04/25/24 0425  WBC 6.9 13.6* 12.2* 8.9  HGB 10.3* 9.2* 9.9* 10.2*  HCT 31.2* 28.1* 29.5* 30.0*  MCV 96.3 95.3 94.2 92.3  PLT 223 178 179 165   Cardiac Enzymes: No results for input(s): CKTOTAL, CKMB, CKMBINDEX, TROPONINI in the last 168 hours. BNP: Invalid input(s): POCBNP CBG: No results for input(s): GLUCAP in the last 168 hours. D-Dimer No results for input(s): DDIMER in the last 72 hours. Hgb A1c No results for input(s): HGBA1C in the last 72 hours. Lipid Profile No results for input(s): CHOL, HDL, LDLCALC, TRIG, CHOLHDL, LDLDIRECT in the last 72 hours. Thyroid function studies No results for input(s): TSH, T4TOTAL, T3FREE, THYROIDAB in the last 72 hours.  Invalid input(s): FREET3 Anemia work up No results for input(s): VITAMINB12, FOLATE, FERRITIN, TIBC, IRON , RETICCTPCT in the last 72 hours. Urinalysis    Component Value Date/Time   COLORURINE YELLOW 10/06/2021 0057   APPEARANCEUR CLEAR 10/06/2021 0057   LABSPEC 1.015 10/06/2021 0057   PHURINE 6.5 10/06/2021 0057   GLUCOSEU NEGATIVE 10/06/2021 0057   HGBUR TRACE (A) 10/06/2021 0057   BILIRUBINUR NEGATIVE 10/06/2021 0057   KETONESUR 40 (A) 10/06/2021 0057   PROTEINUR 100 (A) 10/06/2021 0057   UROBILINOGEN 0.2 11/15/2014 0650  NITRITE NEGATIVE 10/06/2021 0057   LEUKOCYTESUR TRACE (A) 10/06/2021 0057   Sepsis Labs Recent Labs  Lab  04/22/24 1011 04/23/24 0404 04/24/24 0422 04/25/24 0425  WBC 6.9 13.6* 12.2* 8.9   Microbiology Recent Results (from the past 240 hours)  Resp panel by RT-PCR (RSV, Flu A&B, Covid) Anterior Nasal Swab     Status: None   Collection Time: 04/22/24 11:15 PM   Specimen: Anterior Nasal Swab  Result Value Ref Range Status   SARS Coronavirus 2 by RT PCR NEGATIVE NEGATIVE Final    Comment: (NOTE) SARS-CoV-2 target nucleic acids are NOT DETECTED.  The SARS-CoV-2 RNA is generally detectable in upper respiratory specimens during the acute phase of infection. The lowest concentration of SARS-CoV-2 viral copies this assay can detect is 138 copies/mL. A negative result does not preclude SARS-Cov-2 infection and should not be used as the sole basis for treatment or other patient management decisions. A negative result may occur with  improper specimen collection/handling, submission of specimen other than nasopharyngeal swab, presence of viral mutation(s) within the areas targeted by this assay, and inadequate number of viral copies(<138 copies/mL). A negative result must be combined with clinical observations, patient history, and epidemiological information. The expected result is Negative.  Fact Sheet for Patients:  bloggercourse.com  Fact Sheet for Healthcare Providers:  seriousbroker.it  This test is no t yet approved or cleared by the [place]  FDA and  has been authorized for detection and/or diagnosis of SARS-CoV-2 by FDA under an Emergency Use Authorization (EUA). This EUA will remain  in effect (meaning this test can be used) for the duration of the COVID-19 declaration under Section 564(b)(1) of the Act, 21 U.S.C.section 360bbb-3(b)(1), unless the authorization is terminated  or revoked sooner.       Influenza A by PCR NEGATIVE NEGATIVE Final   Influenza B by PCR NEGATIVE NEGATIVE Final    Comment: (NOTE) The Xpert  Xpress SARS-CoV-2/FLU/RSV plus assay is intended as an aid in the diagnosis of influenza from Nasopharyngeal swab specimens and should not be used as a sole basis for treatment. Nasal washings and aspirates are unacceptable for Xpert Xpress SARS-CoV-2/FLU/RSV testing.  Fact Sheet for Patients: bloggercourse.com  Fact Sheet for Healthcare Providers: seriousbroker.it  This test is not yet approved or cleared by the [place]  FDA and has been authorized for detection and/or diagnosis of SARS-CoV-2 by FDA under an Emergency Use Authorization (EUA). This EUA will remain in effect (meaning this test can be used) for the duration of the COVID-19 declaration under Section 564(b)(1) of the Act, 21 U.S.C. section 360bbb-3(b)(1), unless the authorization is terminated or revoked.     Resp Syncytial Virus by PCR NEGATIVE NEGATIVE Final    Comment: (NOTE) Fact Sheet for Patients: bloggercourse.com  Fact Sheet for Healthcare Providers: seriousbroker.it  This test is not yet approved or cleared by the [place]  FDA and has been authorized for detection and/or diagnosis of SARS-CoV-2 by FDA under an Emergency Use Authorization (EUA). This EUA will remain in effect (meaning this test can be used) for the duration of the COVID-19 declaration under Section 564(b)(1) of the Act, 21 U.S.C. section 360bbb-3(b)(1), unless the authorization is terminated or revoked.  Performed at [facility], [address], [place], [place] [address]      Time coordinating discharge: Over 30 minutes  SIGNED:   Almarie KANDICE Hoots, MD  [facility] 04/26/2024, 9:22 PM Pager   If 7PM-7AM, please contact night-coverage [email or web address] Password [email or web address]     [  1]  Allergies Allergen Reactions   Ace Inhibitors Other (See Comments) and Cough    Enalapril  OK   Metformin  And  Related Diarrhea   Penicillins Other (See Comments)    Reaction not recalled   Grapefruit Extract Rash   Minoxidil Other (See Comments)    Grew hair on face

## 2024-05-02 ENCOUNTER — Ambulatory Visit

## 2024-05-13 ENCOUNTER — Ambulatory Visit: Admitting: Podiatry

## 2024-05-21 ENCOUNTER — Ambulatory Visit: Admitting: Student in an Organized Health Care Education/Training Program

## 2024-05-28 ENCOUNTER — Other Ambulatory Visit

## 2024-05-28 ENCOUNTER — Ambulatory Visit: Admitting: Nurse Practitioner
